# Patient Record
Sex: Male | Born: 1960 | Race: White | Hispanic: No | Marital: Single | Smoking: Current every day smoker
Health system: Southern US, Community
[De-identification: ages and names within clinical notes are randomized; demographics above are authoritative.]

## PROBLEM LIST (undated history)

## (undated) DIAGNOSIS — I8290 Acute embolism and thrombosis of unspecified vein: Secondary | ICD-10-CM

## (undated) DIAGNOSIS — E2740 Unspecified adrenocortical insufficiency: Secondary | ICD-10-CM

## (undated) DIAGNOSIS — F32A Depression, unspecified: Secondary | ICD-10-CM

## (undated) DIAGNOSIS — Z720 Tobacco use: Secondary | ICD-10-CM

## (undated) DIAGNOSIS — I10 Essential (primary) hypertension: Secondary | ICD-10-CM

## (undated) DIAGNOSIS — N186 End stage renal disease: Secondary | ICD-10-CM

## (undated) DIAGNOSIS — E785 Hyperlipidemia, unspecified: Secondary | ICD-10-CM

## (undated) DIAGNOSIS — F329 Major depressive disorder, single episode, unspecified: Secondary | ICD-10-CM

## (undated) DIAGNOSIS — I351 Nonrheumatic aortic (valve) insufficiency: Secondary | ICD-10-CM

## (undated) DIAGNOSIS — N200 Calculus of kidney: Secondary | ICD-10-CM

## (undated) DIAGNOSIS — R079 Chest pain, unspecified: Secondary | ICD-10-CM

## (undated) DIAGNOSIS — G459 Transient cerebral ischemic attack, unspecified: Secondary | ICD-10-CM

## (undated) DIAGNOSIS — F419 Anxiety disorder, unspecified: Secondary | ICD-10-CM

## (undated) DIAGNOSIS — G894 Chronic pain syndrome: Secondary | ICD-10-CM

## (undated) HISTORY — DX: Acute embolism and thrombosis of unspecified vein: I82.90

## (undated) HISTORY — DX: Calculus of kidney: N20.0

## (undated) HISTORY — DX: Chest pain, unspecified: R07.9

## (undated) HISTORY — DX: Transient cerebral ischemic attack, unspecified: G45.9

## (undated) HISTORY — DX: Tobacco use: Z72.0

## (undated) HISTORY — DX: Chronic pain syndrome: G89.4

## (undated) HISTORY — DX: Unspecified adrenocortical insufficiency: E27.40

## (undated) HISTORY — PX: NEPHRECTOMY: SHX65

## (undated) HISTORY — DX: Anxiety disorder, unspecified: F41.9

## (undated) HISTORY — DX: Hyperlipidemia, unspecified: E78.5

## (undated) HISTORY — DX: Essential (primary) hypertension: I10

---

## 2006-10-01 ENCOUNTER — Ambulatory Visit: Payer: Self-pay | Admitting: Cardiology

## 2006-10-22 ENCOUNTER — Ambulatory Visit: Payer: Self-pay | Admitting: Cardiology

## 2006-10-22 ENCOUNTER — Inpatient Hospital Stay (HOSPITAL_COMMUNITY): Admission: EM | Admit: 2006-10-22 | Discharge: 2006-10-31 | Payer: Self-pay | Admitting: Cardiology

## 2006-11-03 ENCOUNTER — Ambulatory Visit (HOSPITAL_COMMUNITY): Admission: RE | Admit: 2006-11-03 | Discharge: 2006-11-03 | Payer: Self-pay | Admitting: Urology

## 2007-05-12 ENCOUNTER — Ambulatory Visit: Payer: Self-pay | Admitting: Cardiology

## 2007-11-05 ENCOUNTER — Ambulatory Visit: Payer: Self-pay | Admitting: Cardiology

## 2007-11-05 ENCOUNTER — Encounter: Payer: Self-pay | Admitting: Cardiology

## 2008-04-26 ENCOUNTER — Encounter: Payer: Self-pay | Admitting: Cardiology

## 2008-09-21 ENCOUNTER — Ambulatory Visit: Payer: Self-pay | Admitting: Cardiology

## 2008-09-22 ENCOUNTER — Encounter: Payer: Self-pay | Admitting: Cardiology

## 2008-12-07 ENCOUNTER — Ambulatory Visit: Payer: Self-pay | Admitting: Cardiology

## 2008-12-14 ENCOUNTER — Ambulatory Visit: Payer: Self-pay | Admitting: Cardiology

## 2009-01-04 ENCOUNTER — Ambulatory Visit: Payer: Self-pay | Admitting: Cardiology

## 2009-01-11 ENCOUNTER — Ambulatory Visit: Payer: Self-pay | Admitting: Cardiology

## 2009-02-22 ENCOUNTER — Encounter: Payer: Self-pay | Admitting: Cardiology

## 2009-03-28 ENCOUNTER — Encounter: Payer: Self-pay | Admitting: Cardiology

## 2009-07-24 ENCOUNTER — Ambulatory Visit: Payer: Self-pay | Admitting: Cardiology

## 2009-07-24 DIAGNOSIS — R0789 Other chest pain: Secondary | ICD-10-CM

## 2009-07-24 DIAGNOSIS — Z9189 Other specified personal risk factors, not elsewhere classified: Secondary | ICD-10-CM

## 2009-07-24 DIAGNOSIS — N186 End stage renal disease: Secondary | ICD-10-CM

## 2009-07-24 DIAGNOSIS — I359 Nonrheumatic aortic valve disorder, unspecified: Secondary | ICD-10-CM | POA: Insufficient documentation

## 2009-11-26 ENCOUNTER — Encounter: Payer: Self-pay | Admitting: Cardiology

## 2009-11-28 ENCOUNTER — Encounter: Payer: Self-pay | Admitting: Cardiology

## 2009-11-30 ENCOUNTER — Encounter: Payer: Self-pay | Admitting: Cardiology

## 2009-12-10 ENCOUNTER — Encounter: Payer: Self-pay | Admitting: Cardiology

## 2010-02-12 ENCOUNTER — Ambulatory Visit: Payer: Self-pay | Admitting: Cardiology

## 2010-02-12 DIAGNOSIS — J811 Chronic pulmonary edema: Secondary | ICD-10-CM

## 2010-02-12 DIAGNOSIS — I10 Essential (primary) hypertension: Secondary | ICD-10-CM | POA: Insufficient documentation

## 2010-02-12 DIAGNOSIS — R1310 Dysphagia, unspecified: Secondary | ICD-10-CM | POA: Insufficient documentation

## 2010-02-13 ENCOUNTER — Telehealth (INDEPENDENT_AMBULATORY_CARE_PROVIDER_SITE_OTHER): Payer: Self-pay | Admitting: *Deleted

## 2010-02-14 ENCOUNTER — Encounter: Payer: Self-pay | Admitting: Cardiology

## 2010-02-19 ENCOUNTER — Encounter: Payer: Self-pay | Admitting: Cardiology

## 2010-02-19 ENCOUNTER — Ambulatory Visit: Payer: Self-pay | Admitting: Cardiology

## 2010-03-21 ENCOUNTER — Encounter: Payer: Self-pay | Admitting: Cardiology

## 2010-11-19 NOTE — Progress Notes (Signed)
Summary: needs echo  Phone Note Other Incoming   Summary of Call: Zachary [name], patient also needs ECHO for AI. Forgot to tellyou.   Left message to return call.  Lovina Reach, LPN  April 27, 624THL 624THL AM   ok will order also. vs. Initial call taken by: Delfino Lovett,  February 13, 2010 4:55 PM

## 2010-11-19 NOTE — Assessment & Plan Note (Signed)
Summary: 6 MO FU PER APRIL REMINDER   Visit Type:  Follow-up Primary Provider:  Hasanji  CC:  follow-up visit.  History of Present Illness: the patient is a 50 year old malehistory of end-stage renal disease and moderate aortic insufficiency. The patient has no history of known coronary artery disease, he had a negative stress test a year ago. He also is evaluated with a transesophageal echocardiogram and was felt not to have severe aortic insufficiency. He has preserved ejection fraction. Several months ago the patient was admitted because of acute onset of shortness of breath. Per the wife's history he was found to be pulmonary edema and the plan was to intubate. Ultimately the patient turned around with facial mask BiPAP. He reports to me that his blood pressure was 260/150. The patient stated he felt like he was going to die a day. Unfortunately no consult for cardiology was called for. Ultimately the patient improved but did have several more episodes after hospitalization of dyspnea. The patient's blood pressure remains poorly controlled. He states on dialysis he frequently has very high blood pressures. The patient is currently only taking Adalat and metoprolol. He has a history of palpitations which continue but they are rare. He denies any central chest pain. He's otherwise compliant with his medical regimen. He states short of breath on moderate exertion he denies however any substernal chest pain.  The patient also reports odynophagia and dysphagia, which has been going on for several months. He symptoms however appears to be worsening. He denies any nausea or vomiting.  Preventive Screening-Counseling & Management  Alcohol-Tobacco     Smoking Status: current     Smoking Cessation Counseling: yes     Packs/Day: 1/2PPD  Current Problems (verified): 1)  Essential Hypertension, Malignant  (ICD-401.0) 2)  Pulmonary Edema  (ICD-514) 3)  Dysphagia Unspecified  (ICD-787.20) 4)  Chest  Pain, Atypical  (ICD-786.59) 5)  Oth Spec Pers Hx Presenting Hazards Health Oth  (ICD-V15.89) 6)  Aortic Insufficiency, Moderate  (ICD-424.1) 7)  Esrd  (ICD-585.6)  Current Medications (verified): 1)  Endocet 10-325 Mg Tabs (Oxycodone-Acetaminophen) .... One Tablet 4 Times Daily 2)  Aspirin 81mg s .... One Tablet Daily 3)  Sensipar 30 Mg Tabs (Cinacalcet Hcl) .... One Tablet Daily 4)  Colace 100 Mg Caps (Docusate Sodium) .... One Tablet Two Times Per Day 5)  Zoloft 50 Mg Tabs (Sertraline Hcl) .... One Tablet Daily 6)  Dialyvite 3000 3 Mg Tabs (B Complex-C-Biotin-E-Min-Fa) .... One After Dialysis Monday Wednesday and Friday 7)  Ativan 1 Mg Tabs (Lorazepam) .... One Tablet 3 Times Per Day 8)  Hydrocortisone 20 Mg Tabs (Hydrocortisone) .... One Tablet Daily 9)  Renvela 800 Mg Tabs (Sevelamer Carbonate) .... Take Six By Mouth With Meals and Three By Mouth With Snacks 10)  Metoprolol Succinate 50 Mg Xr24h-Tab (Metoprolol Succinate) .... Take One Tablet By Mouth Daily 11)  Adalat Cc 90 Mg Xr24h-Tab (Nifedipine) .... Take 1 Tablet By Mouth Once A Day 12)  Hydralazine Hcl 25 Mg Tabs (Hydralazine Hcl) .... Take 1 Tablet By Mouth Four Times A Day 13)  Nitrostat 0.4 Mg Subl (Nitroglycerin) .Marland Kitchen.. 1 Tablet Under Tongue At Onset of Chest Pain; You May Repeat Every 5 Minutes For Up To 3 Doses. 14)  Hydralazine Hcl 50 Mg Tabs (Hydralazine Hcl) .... Take One Tablet If Bp Greater Than 160  Allergies (verified): 1)  ! Codeine  Comments:  Nurse/Medical Assistant: The patient's medications and allergies were reviewed with the patient and were updated in the Medication  and Allergy Lists. Verbally gave names of meds.   Past History:  Past Medical History: Last updated: 12/11/2008 chronic renal failure hypertension anxiety edema chest pain T. I. a hyperlipidemia tobacco abuse adrenal insufficiency venous thrombosis chronic pain syndrome staghorn calculus recent removal of left kidney secondary to  staghorn calculus and chronic pyelonephritis  Family History: Last updated: 12/11/2008 smokes one pack a day times many years family history chronic pain hypertension hyperlipidemia coronary artery disease  Social History: Last updated: 07/24/2009 Tobacco Use - Yes.   Risk Factors: Smoking Status: current (02/12/2010) Packs/Day: 1/2PPD (02/12/2010)  Social History: Packs/Day:  1/2PPD  Review of Systems       The patient complains of shortness of breath.  The patient denies fatigue, malaise, fever, weight gain/loss, vision loss, decreased hearing, hoarseness, chest pain, palpitations, prolonged cough, wheezing, sleep apnea, coughing up blood, abdominal pain, blood in stool, nausea, vomiting, diarrhea, heartburn, incontinence, blood in urine, muscle weakness, joint pain, leg swelling, rash, skin lesions, headache, fainting, dizziness, depression, anxiety, enlarged lymph nodes, easy bruising or bleeding, and environmental allergies.    Vital Signs:  Patient profile:   50 year old male Height:      71 inches Weight:      168 pounds Pulse rate:   59 / minute BP sitting:   140 / 73  (left arm) Cuff size:   regular  Vitals Entered By: Georgina Peer (February 12, 2010 10:03 AM) CC: follow-up visit   Physical Exam  Additional Exam:  General: Well-developed, well-nourished in no distress head: Normocephalic and atraumatic eyes PERRLA/EOMI intact, conjunctiva and lids normal nose: No deformity or lesions mouth normal dentition, normal posterior pharynx neck: Supple, no JVD.  No masses, thyromegaly or abnormal cervical nodes. Prominent bilateral carotid pulses lungs: Normal breath sounds bilaterally without wheezing.  Normal percussion heart: regular rate and rhythm with normal S1 and S2, no S3 or S4.  PMI is normal.  [id number] diastolic murmur extending throughout diastole. abdomen: Normal bowel sounds, abdomen is soft and nontender without masses, organomegaly or hernias noted.  No  hepatosplenomegaly musculoskeletal: Back normal, normal gait muscle strength and tone normal pulsus: Pulse is normal in all 4 extremities Extremities: No peripheral pitting edema neurologic: Alert and oriented x 3 skin: Intact without lesions or rashes cervical nodes: No significant adenopathy psychologic: Normal affect    Impression & Recommendations:  Problem # 1:  DYSPHAGIA UNSPECIFIED (ICD-787.20) the patient reports dysphagia and odynophagia. I've referred him for an evaluation with a barium swallow. Orders: Radiology Other (Rad Other)  Problem # 2:  AORTIC INSUFFICIENCY, MODERATE (ICD-424.1) the patient has a history of moderate aortic insufficiency. We will reevaluate him with an echocardiogram to make sure he does not develop worsening aortic insufficiency His updated medication list for this problem includes:    Metoprolol Succinate 50 Mg Xr24h-tab (Metoprolol succinate) .Marland Kitchen... Take one tablet by mouth daily    Nitrostat 0.4 Mg Subl (Nitroglycerin) .Marland Kitchen... 1 tablet under tongue at onset of chest pain; you may repeat every 5 minutes for up to 3 doses.  Problem # 3:  ESRD (ICD-585.6) followed by Dr. Sharrell Ku  Problem # 4:  PULMONARY EDEMA (ICD-514) no evidence of volume overload bipolar edema appears to be mediated by a combination of aortic insufficiency and hypertension.  Problem # 5:  ESSENTIAL HYPERTENSION, MALIGNANT (ICD-401.0) the patient has episodes of severe hypertension. I told him to treat his immediately with sublingual nitroglycerin 0.4 mg x3 and a dose of hydralazine 50 mg for systolic blood  pressure greater than 160 mmHg.the patient needs to avoid the onset of pulmonary edema. His updated medication list for this problem includes:    Metoprolol Succinate 50 Mg Xr24h-tab (Metoprolol succinate) .Marland Kitchen... Take one tablet by mouth daily    Adalat Cc 90 Mg Xr24h-tab (Nifedipine) .Marland Kitchen... Take 1 tablet by mouth once a day    Hydralazine Hcl 25 Mg Tabs (Hydralazine hcl) .Marland Kitchen...  Take 1 tablet by mouth four times a day    Hydralazine Hcl 50 Mg Tabs (Hydralazine hcl) .Marland Kitchen... Take one tablet if bp greater than 160  Patient Instructions: 1)  Hydralazine 25mg  four x day 2)  May take 50mg  Hydralazine if elevated BP greater than 160 3)  Nitroglycerin as needed for severe chest pain 4)  Barrium Swallow  5)  Follow up in  one month Prescriptions: HYDRALAZINE HCL 50 MG TABS (HYDRALAZINE HCL) take one tablet if BP greater than 160  #30 x 1   Entered by:   Lovina Reach, LPN   Authorized by:   Terald Sleeper, MD, [facility]   Signed by:   Lovina Reach, LPN on 624THL   Method used:   Electronically to        [facility] (retail)       [address]       [place], [place]  [address]       Ph: [phone] or [phone]       Fax: [phone]   RxID:   [id number] NITROSTAT 0.4 MG SUBL (NITROGLYCERIN) 1 tablet under tongue at onset of chest pain; you may repeat every 5 minutes for up to 3 doses.  #25 x 3   Entered by:   Lovina Reach, LPN   Authorized by:   Terald Sleeper, MD, [facility]   Signed by:   Lovina Reach, LPN on 624THL   Method used:   Electronically to        [facility] (retail)       [address]       [place], [place]  [address]       Ph: [phone] or [phone]       Fax: [phone]   RxID:   [id number]   Handout requested. HYDRALAZINE HCL 25 MG TABS (HYDRALAZINE HCL) Take 1 tablet by mouth four times a day  #120 x 6   Entered by:   Lovina Reach, LPN   Authorized by:   Terald Sleeper, MD, [facility]   Signed by:   Lovina Reach, LPN on 624THL   Method used:   Electronically to        [facility] (retail)       [address]       [place], [place]  [address]       Ph: [phone] or [phone]       Fax: [phone]   RxID:   [id number]   Handout requested.

## 2010-11-19 NOTE — Miscellaneous (Signed)
Summary: Orders Update  Clinical Lists Changes  Orders: Added new Referral order of 2-D Echocardiogram (2D Echo) - Signed 

## 2010-11-19 NOTE — Letter (Signed)
Summary: Appointment -missed  [facility]. [address], [place] [address]   Phone: [phone]  Fax: [phone]     March 21, 2010 MRN: [id number]     Zachary [name] Rosser, [place]  [id number]     Dear [name],  [facility] records indicate you missed your appointment on March 21, 2010                        with Dr.  Dannielle Burn.   It is very important that we reach you to reschedule this appointment. We look forward to participating in your health care needs.   Please contact us at the number listed above at your earliest convenience to reschedule this appointment.   Sincerely,    Public relations account executive

## 2010-11-19 NOTE — Consult Note (Signed)
Summary: [facility]/ DR/ [facility] CONSULT  [facility]/ DR/ [facility] CONSULT   Imported By: Delfino Lovett 02/12/2010 09:23:29  _____________________________________________________________________  External Attachment:    Type:   Image     Comment:   External Document

## 2010-11-22 NOTE — Letter (Signed)
Summary: [facility] D/C DR. Stoney Bang  [facility] D/C DR. XAJE HASANAJ   Imported By: Delfino Lovett 02/12/2010 09:22:26  _____________________________________________________________________  External Attachment:    Type:   Image     Comment:   External Document

## 2011-03-04 NOTE — Assessment & Plan Note (Signed)
[facility]                          [facility] OFFICE NOTE   Zachary [name], Zachary [name]                        MRN:          [id number]  DATE:12/07/2008                            DOB:          03/19/1961    HISTORY OF PRESENT ILLNESS:  The patient is a 50 year old male with  multiple medical problems with chronic renal failure, on hemodialysis.  The patient was recently hospitalized with chest pain and shortness of  breath.  A 2-D echocardiogram showed that he probably had old-healed  vegetations from the aortic valve, but this was associated with moderate  aortic insufficiency.  He also underwent stress testing, which was  negative for ischemia.   The patient returns now for followup visit.  He denies any chest pain or  shortness of breath.  He states that he does have difficulty with access  of his left arm fistula.  Blood pressure is also somewhat uncontrolled  today.  He is also here to discuss the implications of his aortic  insufficiency.   MEDICATIONS:  1. Renvela 800 mg daily.  2. Endocet 10/325 q.i.d.  3. Aspirin 81 mg a day.  4. Sensipar 30 mg a day.  5. Colace 100 mg p.o. b.i.d.  6. Zoloft 50 mg p.o. daily.  7. Dialyvite 3000 post-dialysis.  8. Lopressor 25 b.i.d.  9. Ativan 1 mg p.o. t.i.d.  10.Hydrocortisone 20 mg p.o. daily.   PHYSICAL EXAMINATION:  VITAL SIGNS:  Blood pressure is 151/86, heart  rate is 79, and weight is 177 pounds.  NECK:  Normal carotid upstroke.  No carotid bruits.  LUNGS:  Clear breath sounds bilaterally.  HEART:  Regular rate and rhythm with a 1/6 diastolic murmur along the  left sternal border.  ABDOMEN:  Soft.  EXTREMITIES:  No cyanosis, clubbing, or edema.  Left fistula in the left  forearm.   PROBLEM LIST:  1. End-stage renal disease, on hemodialysis.  2. History of atypical chest pain with negative Cardiolite (possibly      musculoskeletal).  3. History of hypertension, poorly controlled.  4.  Depression.  5. Diabetes mellitus.   PLAN:  1. I have told the patient that we will need a follow up on his aortic      insufficiency with a 32-monthly physical exam and a 1-yearly      echocardiogram.  2. 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  He      has no heart failure symptoms, however.  3. I did place the patient on Adalat CC, which is a tested medication      to halt progression of aortic insufficiency and also to manage his      blood pressure, which is too high.  4. The patient was advised to use endocarditis prophylaxis prior to      his tooth extractions.     Ernestine Mcmurray, MD,FACC  Electronically Signed    GED/MedQ  DD: 12/07/2008  DT: 12/08/2008  Job #: [id number]   cc:  Wynelle Cleveland

## 2011-03-07 NOTE — Consult Note (Signed)
NAMESNEYDER, SPUHLER                 ACCOUNT NO.:  [id number]   MEDICAL RECORD NO.:  [id number]          PATIENT TYPE:  INP   LOCATION:  4728                         FACILITY:  [facility]   PHYSICIAN:  Maudie Flakes. Hassell Done, M.D.   DATE OF BIRTH:  17-May-1961   DATE OF CONSULTATION:  10/23/2006  DATE OF DISCHARGE:                                 CONSULTATION   HISTORY OF PRESENT ILLNESS:  Zachary [name] is a 50 year old white man  admitted yesterday with chest, left arm, left flank pain, shortness of  breath, and numbness on the left side.  On admission he had a BUN of  37, creatinine of 3.  He says he has been hospitalized twice over the  last few months at [facility].  (Dr. Nichola Sizer, phone number  [phone], is reportedly his primary care physician and saw him in the  hospital).  Dr. Oneita Jolly office is currently closed.  He has been told  that he has kidney trouble.  Renal consult has been requested by Dr.  Dannielle Burn.   PAST MEDICAL HISTORY:  Hypertension (noted 2-3 months ago; ? if he has  had previously).  He has also been told he has glucose intolerance, also  prior history of migraine headaches.   REVIEW OF SYSTEMS:  No palpitations, no flushing, no renal colic, no  gross hematuria, no history of renal calculus disease.  No angina, no  claudication, no melena, no hematochezia.  He did have ? syncopal  episode last month (hospitalized in [place]); he has been told by his  primary care doctor of weak kidneys.   MEDICATIONS PRIOR TO ADMISSION:  1. Ativan 1 mg t.i.d.  2. Furosemide 20 daily.  3. Metoprolol 50 b.i.d.  4. Benicar 20 daily.  5. Aspirin 81 daily.   CURRENT MEDICATIONS:  1. Metoprolol 50 b.i.d.  2. Niacin 250 nightly.  3. Aspirin 325 daily.  4. Hydralazine 10 q.i.d.  5. Nitroglycerin paste 1 inch q.6 h.  6. Morphine/Vicodin p.r.n.   ALLERGIES:  CODEINE.   SOCIAL HISTORY:  He graduated high school.  He grew up in [place].  He  cleans air ducts.  He has a 40-pack-year history  of cigarettes.  He has  tried to cut down.  Occasional mixed drink Press photographer).  He has been  married previously.  He lives with his girlfriend of 21 years.   FAMILY HISTORY:  Mother has had MI/COPD.  He has 9 siblings.  One sister  with diabetes and kidney trouble.  He has two children by a previous  marriage, one child by his current girlfriend.   PHYSICAL EXAMINATION:  GENERAL:  He is awake, alert and oriented x3,  lying in bed.  VITAL SIGNS:  Temperature 97.8, pulse 80, respirations 18, blood  pressure 120/70.  CHEST:  Clear.  HEART:  No rub.  ABDOMEN:  Nontender.  No CVA tenderness.  GU:  Circumcised penis.  Testes descended bilaterally.  EXTREMITIES:  No rash.  No edema.  No arthritis.  NEUROLOGIC:  Right handed.  Strength equal.  No weakness.  Slight  tremor.  LABORATORY DATA:  Hemoglobin 13.3, WBC 8500, PLTS 168,000, sodium 138,  potassium 4, chloride 103, CO2 27, BUN 37, CR 3, glucose 144, calcium  9.3.  Urinalysis:  SG 1.018, pH 5.5; 100 mg/dL glucose, 300 mg/dL  protein, 0-2 white cells.  No RBCs.  Renal ultrasound:  Right kidney not  seen.  Left kidney 14.8 cm ? obstruction of upper pole collecting system  on left with ? Calculus.   IMPRESSION:  1. Chronic kidney disease (I think - no prior records).  2. Proteinuria.  3. 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  4. Hypertension (well controlled).  5. Glucose intolerance.  6. Chest pain.   PLAN:  1. Need old records from Dr. Rhae Lerner (phone [phone] I left message on      his phone for him to call).  2. A 24-hour urine for protein/creatinine, SPEP, UPEP, PTH  3. I have called [facility].  4. No suggestions.  5. Check hemoglobin A1c.  6. Per cardiology.           ______________________________  Maudie Flakes. Hassell Done, M.D.     RFF/MEDQ  D:  10/23/2006  T:  10/24/2006  Job:  [id number]   cc:   Ernestine Mcmurray, MD,FACC  Wynelle Cleveland

## 2011-03-07 NOTE — H&P (Signed)
Zachary [name], Zachary [name]                 ACCOUNT NO.:  [id number]   MEDICAL RECORD NO.:  [id number]          PATIENT TYPE:  INP   LOCATION:  4729                         FACILITY:  [facility]   PHYSICIAN:  Ernestine Mcmurray, MD,FACC DATE OF BIRTH:  30-Jun-1961   DATE OF ADMISSION:  10/22/2006  DATE OF DISCHARGE:                              HISTORY & PHYSICAL   REASON FOR ADMISSION:  Chest and left-sided flank pain.   HISTORY OF PRESENT ILLNESS:  The patient is a 50 year old male with no  prior cardiac history.  The patient has required several admissions at  [facility] over the last month both with chest pain and left  flank pain.  The patient also reports on several occasions nausea and  vomiting as well as dizziness.  His initial admission at [facility] was  mainly focused around an evaluation for possible underlying heart  disease.  The patient was ruled out for myocardial infarction with  negative enzymes.  This was followed by an echo and a stress  echocardiographic study which showed no definite ischemia.  Ejection  fraction was 50-60% but a bicuspid aortic valve could not be ruled out.  Note was that the patient had an abnormal creatinine at 2.9.  The  patient did not have a prior history of renal insufficiency and has not  received a prior work for this to my knowledge.  He also tells me that  he has not seen a nephrologist.  Subsequently, the patient had a second  admission at [facility] when he presented predominantly with  nausea and again left-sided flank pain.  He also reported significant  dizziness and headaches.  He had a workup done with an MRI and a CT as  well as carotid Dopplers.  All studies were reportedly negative.   The patient as well as wife relate now to me that he has had ongoing  intermittent problems since his hospital discharge.  He has sharp  stabbing pains in the mid sternum which are both occurring at rest and  on exertion.  These episodes in general  are brief lived.  Today he had  more prolonged pain with radiation of his pain into the left flank and  left side of his back.  During my physical examination, actually, the  patient is quite tender in the left costovertebral area.  His  presentation is associated with nausea and vomiting which occurred on  several occasions today.  Following this, he stated that he had severe  headaches.  He presented to the ER at [facility] and was given pain  control.  He states now that his chest pain is essentially gone, but  still has left-sided flank pain.  He denies any dysuria or frequency.  He also has no orthopnea or PND and reports no palpitations or syncope  and he also reports no fever or chills.  The patient is now more  comfortable.  He was placed on Nitro paste and IV nitroglycerin in the  [facility] emergency room.  He was transferred for further care to [place]:  TYLENOL with CODEINE.  There are some reports of MORPHINE  allergy, but I discussed this carefully with the patient and he denies  any allergy to morphine.   MEDICATIONS:  1. Metoprolol 50 mg p.o. b.i.d.  2. Niacin 250 mg p.o. q.h.s.  3. Aspirin 325 mg daily.  4. Lasix  20 mg p.o. daily.   PAST MEDICAL HISTORY:  Hypertension, dyslipidemia, with  hypertriglyceridemia triglycerides are reportedly severely elevated at  1100.  Renal insufficiency with creatinine at 2.9, diabetes mellitus,  history of anxiety, questionable TIA on October 01, 2006, with report  of studies as outlined above, stress echocardiographic study within  normal limits.   SOCIAL HISTORY:  The patient lives in [place], [place].  He is an  air duct cleaner.  He smokes half a pack a day.  He rarely exercises.   FAMILY HISTORY:  He has a family history that is significant for heart  disease.  His mother had a myocardial infarction and emphysema.  He also  has several sisters who have heart disease reportedly.   REVIEW OF  SYSTEMS:  No fever or chills, but positive for sweats.  Positive for headaches.  No photophobia or visual changes.  No rash or  lesions.  Positive for chest pain and dyspnea on exertion.  No orthopnea  or PND.  No presyncope or claudication.  No definite frequency or  dysuria. Positive for weakness, but no numbness.  No myalgia or  arthralgias.  Positive for nausea and vomiting.  No diarrhea.  No melena  or hematochezia.  No polyuria or polydipsia.   PHYSICAL EXAMINATION:  VITAL SIGNS:  Blood pressure is 135/70, heart  rate is 84 beats per minute, temperature is 98, saturation is 99% on  room air.  GENERAL:  White male in no apparent distress.  HEENT:  PERRLA.  EOMI, sclerae clear.  Oropharynx is clear.  Poor  dentition.  NECK:  Supple with no carotid bruits, normal carotid upstroke.  JVD is  approximately 6 cm.  HEART:  Regular rate and rhythm.  Normal S1 and S2.  There is a soft  systolic murmur at the left upper sternal border, but no other  pathological murmurs.  There is no S3 or S4.  LUNGS:  Clear breath sounds bilaterally with no wheezing.  SKIN:  No rash or lesions.  ABDOMEN:  Soft and nontender with no rebound or guarding.  Good bowel  sounds.  Left flank tenderness and positive for CVA tenderness.  GENITOURINARY:  RECTAL:  Deferred.  EXTREMITIES:  No cyanosis, clubbing, or edema.  NEUROLOGY:  The patient is alert, oriented, and grossly nonfocal.   Chest x-ray is pending.  Reportedly a chest x-ray was done at [facility]  which was within normal limits.  Chest x-ray on September 27, 2006, showed  no acute abnormalities.   EKG demonstrates normal sinus rhythm with nonspecific T wave changes.  D-  dimer was 219 at [place]:  White count is 9.8, hemoglobin 13.9, hematocrit 39,  platelet count 187.  Glucose 117, BUN 33, creatinine 2.8, GFR is 26 mL  per minute, sodium 135, potassium 4.2, chloride 100, CO2 25.  Troponin 0.01.  Total CPK is within normal  limits.   CT scan of the head shows no acute abnormality which was done at  [facility].   PROBLEM LIST:  1. Atypical chest pain.  2. Left flank pain and left costovertebral angle tenderness.      a.  Rule out hydronephrosis.      b.     Rule out nephrolithiasis.  3.Hypertriglyceridemia.    A.  Rule out pancreatitis.  1. Renal insufficiency, suspected chronic, but no prior workup.  2. History of hypertension, poorly controlled blood pressure.      a.     Rule out aortic dissection (unlikely).  3. Hypertriglyceridemia.   PLAN:  1. The patient presents with a very confusing picture.  I was      initially told that the patient presented with chest pain to the      emergency room, but his pain predominately centers around the left      flank and left costovertebral area.  2. Cardiac enzymes will be obtained, but in light of the patient's      negative stress echocardiographic study, I do not suspect a further      ischemia workup is acutely indicated, particularly in light of the      patient's renal insufficiency of 2.9.  I do not think cardiac      catheterization is presently indicated.  3. Further evaluation of left CVA tenderness is indicated and I am not      even sure as to the duration of the patient's renal insufficiency.      We will obtain an ultrasound of the kidneys in the morning to rule      out hydronephrosis.  4. The patient also has longstanding history of hypertension and on      last presentation at [facility] he was quite hypertensive.  He does      report some pain radiating to the back and we will further rule out      the possibility of aortic dissection, although, I think this is      distinctly unlikely.  5. Blood pressure control will be obtained with the addition of      hydralazine, but we will put Lasix on hold for now.  6. Further diagnostic testing has been ordered as per orders and I      suspect a renal consultation as well as a medicine consult may  be      in order during this admission.      Ernestine Mcmurray, MD,FACC  Electronically Signed     GED/MEDQ  D:  10/22/2006  T:  10/23/2006  Job:  [id number]

## 2011-03-07 NOTE — Discharge Summary (Signed)
Zachary [name], [name]                 ACCOUNT NO.:  [id number]   MEDICAL RECORD NO.:  [id number]          PATIENT TYPE:  INP   LOCATION:  5018                         FACILITY:  [facility]   PHYSICIAN:  Wallis Bamberg. Johnsie Cancel, MD, FACCDATE OF BIRTH:  06/07/1961   DATE OF ADMISSION:  10/22/2006  DATE OF DISCHARGE:  10/31/2006                               DISCHARGE SUMMARY   DISCHARGING PHYSICIAN:  Collier Salina C. Johnsie Cancel, MD, [facility].   PRIMARY CARDIOLOGIST:  Ernestine Mcmurray, MD, [facility].   UROLOGIST:  Domingo Pulse, M.D.   NEPHROLOGY:  Elzie Rings. Lorrene Reid, M.D.   PRIMARY CARE:  Dr. Nichola Sizer, phone number is [phone].   DISCHARGING DIAGNOSES:  1. Atypical chest pain  2. Left flank pain and left costovertebral angle tenderness.      a.     Status post cystoscopy with attempted right retrograde, left       retrograde, left Double-J catheter insertion with inability to       pass guidewire into the upper pole segment.  3. Renal insufficiency.  4. Renal calculi.  5. Solitary kidney.  6. Hypertension.  7. Hypercholesteremia.  8. Diabetes mellitus.  9. Ongoing tobacco use.   PROCEDURES THIS ADMISSION:  1. October 23, 2006, MRI of the chest without contrast:  Negative for      aortic dissection, questionable left hydronephrosis.  2. Renal ultrasound, October 23, 2006, showing obstruction of the upper      pole system of the left kidney, probably due to calculus.  3. A CT of the abdomen, October 24, 2006, negative for distal urethral      calculus, normal appendix, functional solitary left kidney with      nephrolithiasis, upper pole obstruction, and hydronephrosis, and      fatty of infiltration of the liver.  4. October 26, 2006, left percutaneous nephrostomy tube by Dr. Markus Daft.  The patient underwent placement of an 8-French percutaneous      nephrostomy tube within the dilated upper pole calix.  The renal      pelvis could be cannulated.  Recently placed urethral stent now in      the distal  ureter.  Multiple stones associated with left kidney.  5. Abdominal view, October 28, 2006, of left nephrostomy tube shows      migration of left urethral stent, is seen with the tip of the      catheter projecting over the expected location of the left renal      pelvis.  There is a large calculus contained within.  The remainder      of the stent is formed in the bladder.   HOSPITAL COURSE:  Zachary [name] is a 50 year old Caucasian gentleman with no  prior cardiac history who presented to [facility] complaining of  chest discomfort and left flank pain, associated with nausea and  vomiting as well as dizziness.  His initial admission at [facility] focus  mainly on possible underlying heart disease.  The patient ruled out for  MI with negative enzymes.  This was followed by  echocardiogram and a  stress echo which showed no definite ischemia.  ER was 50-60% but a  bicuspid aortic valve could not be ruled out.  The patient was also  found to have a creatinine of 2.9.  The patient had an MRI and a CT done  as well as carotid Dopplers, all studies were reportedly negative.  Cardiology was asked to see the patient upon admission to [facility] secondary to chest discomfort.  The patient was placed on Nitro  paste and IV nitroglycerin and transferred to [facility] for  further evaluation.   A chest x-ray showed no acute abnormalities.  EKG demonstrated normal  sinus rhythm with nonspecific T-wave changes.  D-dimer was within normal  limits.  Initial blood work showed a WBC of 9.8, hemoglobin 13.9,  hematocrit of 39, platelet count of 187,000, BUN 33, creatinine 2.8.  CT  scan of the head showed no acute abnormalities at [facility].   The patient's symptoms were very concerning.  Chest discomfort, somewhat  atypical.  The patient found to be hypertensive, however, and also in  renal insufficiency.  Zachary [name] has a history of longstanding  hypertension in the setting of  ongoing tobacco use.  Further diagnostic  testing was done under the guidance of renal who was asked to consult.   Dr. Hassell Done saw the patient in consultation on October 23, 2006.  He felt the  patient may have had chronic kidney disease after further discussion  with the patient but no prior records were available.  He also noted  proteinuria and a solitary kidney with questionable obstruction of  collecting system in the left upper pole.  Hypertension, currently well  controlled.  Glucose intolerance and chest pain with negative cardiac  workup.  He ordered a 24-hour urine and tried to obtain records from the  patient's primary care physician.  Dr. Amalia Hailey saw the patient in  consultation secondary to renal calculi and obstruction of the upper  pole of the left kidney.  He ordered a non-contrast CT to delineate  anatomy further.  Cystoscopy retrograde and stent placement with  possible stenting of both poles of the left kidney if obstructed.  The  patient went for a cystoscopy with attempted right retrograde, left  retrograde, left DOUBLE-J catheter insertion on the 6th.  With further  treatment, the patient had a left percutaneous nephrostomy tube placed.  Left percutaneous nephrostomy tube was placed on January 7th.   The patient was started on a low-dose ACE inhibitor.  Beta blocker was  discontinued.  The patient has had little output from upper pole in the  setting of low renal function and solitary kidney.  It is felt that the  patient needs to be evaluated by Dr. Desma Maxim at [facility].  Left  nephrostomy tube left in place with gravity drainage.  The patient still  having some discomfort, was treated with p.o. pain medications as the  patient will need further workup - lithotripsy - per renal.   Dr. Johnsie Cancel in to see the patient on day of discharge.  Vital signs  stable.  He spoke with Dr. Amalia Hailey who has arranged outpatient followup at [facility] with Dr. Desma Maxim.  The patient is  being discharged home with  medications as stated below.   The patient to follow up with urology and renal as instructed by Dr.  Amalia Hailey.   At time of discharge his medications include:  1. Lisinopril 10 mg daily.  2. Niacin 250  mg at bedtime.  3. Pyridium 200 mg every 8 hours p.r.n.  4. Ditropan 5 mg every 8 hours p.r.n.  5. Percocet 5/325 every 6 hours p.r.n.  6. Colace 1-2 tablets daily as needed.  7. Aspirin 81 mg daily.   The patient has been instructed to stop his Toprol and Lasix.   WOUND CARE:  Leg bag as instructed by Dr. Amalia Hailey.   FOLLOWUP:  1. Follow up with Dr. Amalia Hailey [phone] as instructed.  2. Follow up with Dr. Desma Maxim at [facility], number [phone].  I      asked the patient's wife to call Monday and confirm appointment.   No further cardiac workup at this time, until renal status resolved.  The patient can follow up with Dr. Dannielle Burn at our [facility] office p.r.n.   DISCHARGE LABORATORY WORK:  H&H 10.8 and 30.6 with a platelet count  131,000, WBC 6.8.  Sodium 138, potassium 4.4, glucose 175, BUN 33,  creatinine 2.97.  Twenty-four-hour uric acid, total urine collected  2100, uric acid 22.4, uric acid 24 hour 470.4.  Urine to calcium 24 hour  urine 42.  Iron 58, TIBC 280, percent sat 21 UIBC 222.   DURATION OF DISCHARGE ENCOUNTER:  Sixty minutes.      Rosanne Sack, ACNP      Wallis Bamberg. Johnsie Cancel, MD, [facility]  Electronically Signed    MB/MEDQ  D:  10/31/2006  T:  10/31/2006  Job:  [id number]   cc:   Wallis Bamberg. Johnsie Cancel, MD, [facility]  Ernestine Mcmurray, MD,FACC  Domingo Pulse, M.D.  Elzie Rings Lorrene Reid, M.D.  Assimos, Dr.  Nichola Sizer, Dr.

## 2011-03-07 NOTE — Op Note (Signed)
Zachary [name], Zachary [name]                 ACCOUNT NO.:  [id number]   MEDICAL RECORD NO.:  [id number]          PATIENT TYPE:  INP   LOCATION:  [id number]                         FACILITY:  [facility]   PHYSICIAN:  Domingo Pulse, M.D.  DATE OF BIRTH:  Jun 30, 1961   DATE OF PROCEDURE:  10/25/2006  DATE OF DISCHARGE:                               OPERATIVE REPORT   SERVICE:  Urology.   PREOPERATIVE DIAGNOSES:  1. Obstructed upper pole, left kidney, secondary to partial staghorn.  2. Atrophic right kidney.   POSTOPERATIVE DIAGNOSES:  1. Obstructed upper pole, left kidney, secondary to partial staghorn.  2. Atrophic right kidney.   PROCEDURES:  Cystoscopy, attempted right retrograde, left retrograde,  left JJ catheter insertion.   SURGEON:  Domingo Pulse, M.D.   ANESTHESIA:  General.   COMPLICATIONS:  Inability to pass guidewire into upper pole segment.   HISTORY:  This 50 year old male has been admitted to the hospital on two  recent occasion for what was first thought to be flank pain but it  became clear that some of the pain was actually from the left kidney.  Evaluation demonstrated azotemia and an ultrasound showed that the  patient had hydronephrosis of the upper pole segment and no evidence of  a kidney on the right hand side.  The patient had a CT scan done without  contrast which showed at most a nubbin of a right kidney, most likely  due to atrophy dating back to childhood, and on the left hand side, the  patient had what appeared to be a single ureter but an obstructed upper  pole segment due to a partial staghorn.  The collecting system on that  left hand side showed marked dilation above a caliceal stricture with  stone impacted and the upper pole segment clearly was not well drained.  It is unclear how much function there is in that portion of the kidney  but he patient is now to undergo attempted placement of a stent up into  that segment to see if we can decompress the kidney.  If that cannot be  decompressed, will have a percutaneous nephrostomy tube placement done  to determine if there is any function coming from that outer pole and  then plans can be made to determine the best method of handling his  situation.  Patient clearly has very poor renal function options in a  solitary kidney with pole obstruction are all fraught with the  possibility he could end up losing his remaining renal function.  The  patient is now to undergo cystoscopy, retrograde, to attempt a JJ  catheter placement.  He understands the risks and benefits of the  procedure and gave full informed consent.   PROCEDURE:  After a successful induction of general anesthesia, the  patient was placed in the dorsal lithotomy position, prepped with  Betadine and draped in the usual sterile fashion.  Cystoscopy was  performed.  Urethra was visualized in its entirety and found  to be  normal.  Beyond the verumontanum, the patient had no significant  prostatic enlargement.  A  tiny dimple of a right ureter could be  identified but there is clearly no flow coming out of that ureter.  Attempts to cannulate this performed retrograde were unsuccessful and it  certainly felt that this patient has an atrophied kidney with a stenotic  ureter and no function on that side.  A left retrograde was then  performed.  The patient had a reasonably normal-appearing ureter.  He  had a normal collecting system in the low and mid pole with a very  dilated upper pole.  Some contrast could be seen trapped up above what  appeared to be a stone but there was no real obvious connection between  the collecting system and that caliceal system.  This is was tightly  obstructed and a wire could not be passed into that area.  The wire  seemed to deviate into the mid pole or lower pole.  Decision was made to  place a JJ catheter and then eventually to place a nephrostomy tube in  that upper pole segment and this will allow either  for a percutaneous  nephrolithotripsy.  The JJ catheter was then advanced over the guide  wire which was then removed and the JJ coiled normally in the lower pole  section of the kidney.  The bladder was drained.  The patient tolerated  the procedure well and was taken to the recovery room in good condition.   We will arrange for a left percutaneous nephrostomy tube to be placed  and determine if that upper pole segment actually has much function.  If  there is little if any function in that area, then a partial nephrectomy  to remove that section and to extract the stones might be the optimal  way to maximize his renal function.  If there does appear to be some  function on that side with some output coming out that upper pole, then  the patient needs to be considered for a percutaneous nephrolithotripsy  along with dilation of the obstructing caliceal system to allow for free  access of that upper pole down into the pelvis and better drainage of  that kidney.           ______________________________  Domingo Pulse, M.D.  Electronically Signed     RJE/MEDQ  D:  10/25/2006  T:  10/25/2006  Job:  [id number]   cc:   Elzie Rings. Lorrene Reid, M.D.  Ernestine Mcmurray, MD,FACC

## 2011-03-07 NOTE — Consult Note (Signed)
Zachary [name], Zachary [name]                 ACCOUNT NO.:  [id number]   MEDICAL RECORD NO.:  [id number]          PATIENT TYPE:  INP   LOCATION:  4729                         FACILITY:  [facility]   PHYSICIAN:  Domingo Pulse, M.D.  DATE OF BIRTH:  Apr 08, 1961   DATE OF CONSULTATION:  DATE OF DISCHARGE:                                 CONSULTATION   CONSULTING PHYSICIANS:  1. Dr. Terald Sleeper.  2. Dr. Salem Senate.   REASON FOR CONSULTATION:  1. Renal insufficiency.  2. Renal calculi.  3. Possible obstruction upper pole left kidney.  4. Solitary kidney.   HISTORY:  This 50 year old male has had 2 admissions to [facility] the past month with chest pain as well as some left flank pain.  This has been associated with nausea, vomiting, dizziness.  The patient  was thought to have heart disease; however, he had negative enzymes.  He  has had an echo and stress echocardiogram with no evidence of ischemia.  The patient was noted to have an abnormal creatinine of 2.9.  There was  no previous history of renal insufficiency and the patient did not know  that he had any renal problems.  The patient apparently had an MRI and a  CT as well as carotid Dopplers that were negative.  The patient ended up  coming to the hospital here for additional evaluation.  Dr. Dannielle Burn  evaluated him and felt that the patient's pain was really much more in  the left costovertebral area and that it was associated with nausea and  vomiting, and was not so much of a cardiac problem.  During the  patient's initial evaluation an ultrasound was obtained which showed an  absent right kidney, but showed a left kidney that appeared to have a  duplicated system with stones possibly obstructing his upper pole  segment.  Urologic consultation has sought to determine the best way to  handle this situation.   PAST MEDICAL HISTORY:  Remarkable for:  1. Hypertension.  2. Elevated cholesterol.  3. Diabetes mellitus.  4. Renal  insufficiency.   SOCIAL HISTORY:  The patient does have a smoking history, although he  has cut back to 1/2 pack a day.   FAMILY HISTORY:  Pertinent for heart disease and COPD in his mother and  also several other family members who have heart disease.   REVIEW OF SYSTEMS:  Pertinent for nausea, vomiting, headaches, abdominal  pain, left flank pain, as well as possible chest pain.  He has not had  shortness of breath.  There were no urinary symptoms up until recently.  Patient said he had good urinary flow and a good stream, but does note  that in the last day or so he has had some problems with dysuria,  although no hematuria or fever.   PHYSICAL EXAMINATION:  GENERAL:  Well-developed, well-nourished male.  He is sitting quietly on the bed.  He has a telemetry hooked up.  HEENT:  Normocephalic, atraumatic.  Cranial nerves II-XII are grossly  intact.  NECK:  Supple, no adenopathy, thyromegaly.  LUNGS:  Respirations  are normal.  HEART:  Regular rate and rhythm, no murmurs, rubs, gallops present.  ABDOMEN:  Soft, nontender, no palpable mass, rebound or guarding.  He  does have some left flank pain, although it is a little bit lower down  than were one would normally expect for classic left flank pain.  GU:  Not performed, but will be done at the time of his planned  cystoscopy.  EXTREMITIES:  No cyanosis, clubbing, or edema.  NEURO:  Vascular systems were intact.   LABORATORY DATA:  The patient has a BUN of 37, creatinine 2.9.  Laboratories otherwise unremarkable.  He has a normal white count and is  not anemic.  His urinalysis does not show any evidence of infection.  As  noted above, he had a renal ultrasound, which showed that there were  some stones in the possible area of the upper pole.  It is unclear if  there is a kidney on the right hand side.   IMPRESSION:  1. Renal insufficiency.  2. 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  3. 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   PLAN:  1. Noncontrast CT to delineate anatomy further, and in particular see      if there is an ectopic right kidney and also to determine if there      are any stones along the course of the ureter.  2. Cystoscopy, retrogrades and stent placement with possible stenting      of both poles of the left kidney if obstructed.  Will schedule the      patient to do this on Sunday.           ______________________________  Domingo Pulse, M.D.  Electronically Signed     RJE/MEDQ  D:  10/23/2006  T:  10/24/2006  Job:  [id number]

## 2011-10-18 DIAGNOSIS — Z886 Allergy status to analgesic agent status: Secondary | ICD-10-CM | POA: Diagnosis not present

## 2011-10-18 DIAGNOSIS — E2749 Other adrenocortical insufficiency: Secondary | ICD-10-CM | POA: Diagnosis not present

## 2011-10-18 DIAGNOSIS — F329 Major depressive disorder, single episode, unspecified: Secondary | ICD-10-CM | POA: Diagnosis present

## 2011-10-18 DIAGNOSIS — I5032 Chronic diastolic (congestive) heart failure: Secondary | ICD-10-CM | POA: Diagnosis not present

## 2011-10-18 DIAGNOSIS — Z954 Presence of other heart-valve replacement: Secondary | ICD-10-CM | POA: Diagnosis not present

## 2011-10-18 DIAGNOSIS — N039 Chronic nephritic syndrome with unspecified morphologic changes: Secondary | ICD-10-CM | POA: Diagnosis not present

## 2011-10-18 DIAGNOSIS — Z7982 Long term (current) use of aspirin: Secondary | ICD-10-CM | POA: Diagnosis not present

## 2011-10-18 DIAGNOSIS — D696 Thrombocytopenia, unspecified: Secondary | ICD-10-CM | POA: Diagnosis present

## 2011-10-18 DIAGNOSIS — Z905 Acquired absence of kidney: Secondary | ICD-10-CM | POA: Diagnosis not present

## 2011-10-18 DIAGNOSIS — J449 Chronic obstructive pulmonary disease, unspecified: Secondary | ICD-10-CM | POA: Diagnosis present

## 2011-10-18 DIAGNOSIS — Z79899 Other long term (current) drug therapy: Secondary | ICD-10-CM | POA: Diagnosis not present

## 2011-10-18 DIAGNOSIS — I509 Heart failure, unspecified: Secondary | ICD-10-CM | POA: Diagnosis present

## 2011-10-18 DIAGNOSIS — N186 End stage renal disease: Secondary | ICD-10-CM | POA: Diagnosis present

## 2011-10-18 DIAGNOSIS — D638 Anemia in other chronic diseases classified elsewhere: Secondary | ICD-10-CM | POA: Diagnosis present

## 2011-10-18 DIAGNOSIS — E119 Type 2 diabetes mellitus without complications: Secondary | ICD-10-CM | POA: Diagnosis present

## 2011-10-18 DIAGNOSIS — J189 Pneumonia, unspecified organism: Secondary | ICD-10-CM | POA: Diagnosis not present

## 2011-10-18 DIAGNOSIS — I120 Hypertensive chronic kidney disease with stage 5 chronic kidney disease or end stage renal disease: Secondary | ICD-10-CM | POA: Diagnosis not present

## 2011-10-18 DIAGNOSIS — Z992 Dependence on renal dialysis: Secondary | ICD-10-CM | POA: Diagnosis not present

## 2011-10-18 DIAGNOSIS — I10 Essential (primary) hypertension: Secondary | ICD-10-CM | POA: Diagnosis not present

## 2011-10-18 DIAGNOSIS — F17200 Nicotine dependence, unspecified, uncomplicated: Secondary | ICD-10-CM | POA: Diagnosis present

## 2011-10-18 DIAGNOSIS — D649 Anemia, unspecified: Secondary | ICD-10-CM | POA: Diagnosis present

## 2011-10-24 DIAGNOSIS — D509 Iron deficiency anemia, unspecified: Secondary | ICD-10-CM | POA: Diagnosis not present

## 2011-10-24 DIAGNOSIS — D631 Anemia in chronic kidney disease: Secondary | ICD-10-CM | POA: Diagnosis not present

## 2011-10-24 DIAGNOSIS — N039 Chronic nephritic syndrome with unspecified morphologic changes: Secondary | ICD-10-CM | POA: Diagnosis not present

## 2011-10-24 DIAGNOSIS — N186 End stage renal disease: Secondary | ICD-10-CM | POA: Diagnosis not present

## 2011-10-27 DIAGNOSIS — D631 Anemia in chronic kidney disease: Secondary | ICD-10-CM | POA: Diagnosis not present

## 2011-10-27 DIAGNOSIS — D509 Iron deficiency anemia, unspecified: Secondary | ICD-10-CM | POA: Diagnosis not present

## 2011-10-27 DIAGNOSIS — N186 End stage renal disease: Secondary | ICD-10-CM | POA: Diagnosis not present

## 2011-10-29 DIAGNOSIS — N039 Chronic nephritic syndrome with unspecified morphologic changes: Secondary | ICD-10-CM | POA: Diagnosis not present

## 2011-10-29 DIAGNOSIS — D509 Iron deficiency anemia, unspecified: Secondary | ICD-10-CM | POA: Diagnosis not present

## 2011-10-29 DIAGNOSIS — N186 End stage renal disease: Secondary | ICD-10-CM | POA: Diagnosis not present

## 2011-10-31 DIAGNOSIS — N186 End stage renal disease: Secondary | ICD-10-CM | POA: Diagnosis not present

## 2011-10-31 DIAGNOSIS — D631 Anemia in chronic kidney disease: Secondary | ICD-10-CM | POA: Diagnosis not present

## 2011-10-31 DIAGNOSIS — D509 Iron deficiency anemia, unspecified: Secondary | ICD-10-CM | POA: Diagnosis not present

## 2011-11-03 DIAGNOSIS — D631 Anemia in chronic kidney disease: Secondary | ICD-10-CM | POA: Diagnosis not present

## 2011-11-03 DIAGNOSIS — N186 End stage renal disease: Secondary | ICD-10-CM | POA: Diagnosis not present

## 2011-11-03 DIAGNOSIS — D509 Iron deficiency anemia, unspecified: Secondary | ICD-10-CM | POA: Diagnosis not present

## 2011-11-04 DIAGNOSIS — I10 Essential (primary) hypertension: Secondary | ICD-10-CM | POA: Diagnosis not present

## 2011-11-04 DIAGNOSIS — M549 Dorsalgia, unspecified: Secondary | ICD-10-CM | POA: Diagnosis not present

## 2011-11-04 DIAGNOSIS — N186 End stage renal disease: Secondary | ICD-10-CM | POA: Diagnosis not present

## 2011-11-04 DIAGNOSIS — M109 Gout, unspecified: Secondary | ICD-10-CM | POA: Diagnosis not present

## 2011-11-04 DIAGNOSIS — B953 Streptococcus pneumoniae as the cause of diseases classified elsewhere: Secondary | ICD-10-CM | POA: Diagnosis not present

## 2011-11-05 DIAGNOSIS — D509 Iron deficiency anemia, unspecified: Secondary | ICD-10-CM | POA: Diagnosis not present

## 2011-11-05 DIAGNOSIS — N039 Chronic nephritic syndrome with unspecified morphologic changes: Secondary | ICD-10-CM | POA: Diagnosis not present

## 2011-11-05 DIAGNOSIS — N186 End stage renal disease: Secondary | ICD-10-CM | POA: Diagnosis not present

## 2011-11-08 DIAGNOSIS — D509 Iron deficiency anemia, unspecified: Secondary | ICD-10-CM | POA: Diagnosis not present

## 2011-11-08 DIAGNOSIS — N186 End stage renal disease: Secondary | ICD-10-CM | POA: Diagnosis not present

## 2011-11-08 DIAGNOSIS — N039 Chronic nephritic syndrome with unspecified morphologic changes: Secondary | ICD-10-CM | POA: Diagnosis not present

## 2011-11-10 DIAGNOSIS — D631 Anemia in chronic kidney disease: Secondary | ICD-10-CM | POA: Diagnosis not present

## 2011-11-10 DIAGNOSIS — N186 End stage renal disease: Secondary | ICD-10-CM | POA: Diagnosis not present

## 2011-11-10 DIAGNOSIS — D509 Iron deficiency anemia, unspecified: Secondary | ICD-10-CM | POA: Diagnosis not present

## 2011-11-12 DIAGNOSIS — D631 Anemia in chronic kidney disease: Secondary | ICD-10-CM | POA: Diagnosis not present

## 2011-11-12 DIAGNOSIS — N186 End stage renal disease: Secondary | ICD-10-CM | POA: Diagnosis not present

## 2011-11-12 DIAGNOSIS — D509 Iron deficiency anemia, unspecified: Secondary | ICD-10-CM | POA: Diagnosis not present

## 2011-11-14 DIAGNOSIS — N186 End stage renal disease: Secondary | ICD-10-CM | POA: Diagnosis not present

## 2011-11-14 DIAGNOSIS — N039 Chronic nephritic syndrome with unspecified morphologic changes: Secondary | ICD-10-CM | POA: Diagnosis not present

## 2011-11-14 DIAGNOSIS — D509 Iron deficiency anemia, unspecified: Secondary | ICD-10-CM | POA: Diagnosis not present

## 2011-11-17 DIAGNOSIS — D509 Iron deficiency anemia, unspecified: Secondary | ICD-10-CM | POA: Diagnosis not present

## 2011-11-17 DIAGNOSIS — N186 End stage renal disease: Secondary | ICD-10-CM | POA: Diagnosis not present

## 2011-11-17 DIAGNOSIS — N039 Chronic nephritic syndrome with unspecified morphologic changes: Secondary | ICD-10-CM | POA: Diagnosis not present

## 2011-11-19 DIAGNOSIS — N186 End stage renal disease: Secondary | ICD-10-CM | POA: Diagnosis not present

## 2011-11-19 DIAGNOSIS — N039 Chronic nephritic syndrome with unspecified morphologic changes: Secondary | ICD-10-CM | POA: Diagnosis not present

## 2011-11-19 DIAGNOSIS — D509 Iron deficiency anemia, unspecified: Secondary | ICD-10-CM | POA: Diagnosis not present

## 2011-11-20 DIAGNOSIS — N186 End stage renal disease: Secondary | ICD-10-CM | POA: Diagnosis not present

## 2011-11-21 DIAGNOSIS — D509 Iron deficiency anemia, unspecified: Secondary | ICD-10-CM | POA: Diagnosis not present

## 2011-11-21 DIAGNOSIS — D631 Anemia in chronic kidney disease: Secondary | ICD-10-CM | POA: Diagnosis not present

## 2011-11-21 DIAGNOSIS — N186 End stage renal disease: Secondary | ICD-10-CM | POA: Diagnosis not present

## 2011-12-18 DIAGNOSIS — N186 End stage renal disease: Secondary | ICD-10-CM | POA: Diagnosis not present

## 2011-12-19 DIAGNOSIS — D631 Anemia in chronic kidney disease: Secondary | ICD-10-CM | POA: Diagnosis not present

## 2011-12-19 DIAGNOSIS — N186 End stage renal disease: Secondary | ICD-10-CM | POA: Diagnosis not present

## 2011-12-19 DIAGNOSIS — D509 Iron deficiency anemia, unspecified: Secondary | ICD-10-CM | POA: Diagnosis not present

## 2011-12-19 DIAGNOSIS — Z992 Dependence on renal dialysis: Secondary | ICD-10-CM | POA: Diagnosis not present

## 2012-01-01 DIAGNOSIS — M109 Gout, unspecified: Secondary | ICD-10-CM | POA: Diagnosis not present

## 2012-01-01 DIAGNOSIS — N186 End stage renal disease: Secondary | ICD-10-CM | POA: Diagnosis not present

## 2012-01-01 DIAGNOSIS — K250 Acute gastric ulcer with hemorrhage: Secondary | ICD-10-CM | POA: Diagnosis not present

## 2012-01-01 DIAGNOSIS — B953 Streptococcus pneumoniae as the cause of diseases classified elsewhere: Secondary | ICD-10-CM | POA: Diagnosis not present

## 2012-01-01 DIAGNOSIS — I10 Essential (primary) hypertension: Secondary | ICD-10-CM | POA: Diagnosis not present

## 2012-01-01 DIAGNOSIS — M549 Dorsalgia, unspecified: Secondary | ICD-10-CM | POA: Diagnosis not present

## 2012-01-07 DIAGNOSIS — N186 End stage renal disease: Secondary | ICD-10-CM | POA: Diagnosis not present

## 2012-01-18 DIAGNOSIS — N186 End stage renal disease: Secondary | ICD-10-CM | POA: Diagnosis not present

## 2012-01-19 DIAGNOSIS — D509 Iron deficiency anemia, unspecified: Secondary | ICD-10-CM | POA: Diagnosis not present

## 2012-01-19 DIAGNOSIS — N039 Chronic nephritic syndrome with unspecified morphologic changes: Secondary | ICD-10-CM | POA: Diagnosis not present

## 2012-01-19 DIAGNOSIS — R509 Fever, unspecified: Secondary | ICD-10-CM | POA: Diagnosis not present

## 2012-01-19 DIAGNOSIS — J1529 Pneumonia due to other staphylococcus: Secondary | ICD-10-CM | POA: Diagnosis not present

## 2012-01-19 DIAGNOSIS — N186 End stage renal disease: Secondary | ICD-10-CM | POA: Diagnosis not present

## 2012-01-19 DIAGNOSIS — D631 Anemia in chronic kidney disease: Secondary | ICD-10-CM | POA: Diagnosis not present

## 2012-01-21 DIAGNOSIS — D509 Iron deficiency anemia, unspecified: Secondary | ICD-10-CM | POA: Diagnosis not present

## 2012-01-21 DIAGNOSIS — N039 Chronic nephritic syndrome with unspecified morphologic changes: Secondary | ICD-10-CM | POA: Diagnosis not present

## 2012-01-21 DIAGNOSIS — J1529 Pneumonia due to other staphylococcus: Secondary | ICD-10-CM | POA: Diagnosis not present

## 2012-01-21 DIAGNOSIS — R509 Fever, unspecified: Secondary | ICD-10-CM | POA: Diagnosis not present

## 2012-01-21 DIAGNOSIS — N186 End stage renal disease: Secondary | ICD-10-CM | POA: Diagnosis not present

## 2012-01-23 DIAGNOSIS — J1529 Pneumonia due to other staphylococcus: Secondary | ICD-10-CM | POA: Diagnosis not present

## 2012-01-23 DIAGNOSIS — N039 Chronic nephritic syndrome with unspecified morphologic changes: Secondary | ICD-10-CM | POA: Diagnosis not present

## 2012-01-23 DIAGNOSIS — R509 Fever, unspecified: Secondary | ICD-10-CM | POA: Diagnosis not present

## 2012-01-23 DIAGNOSIS — D509 Iron deficiency anemia, unspecified: Secondary | ICD-10-CM | POA: Diagnosis not present

## 2012-01-23 DIAGNOSIS — N186 End stage renal disease: Secondary | ICD-10-CM | POA: Diagnosis not present

## 2012-01-26 DIAGNOSIS — A411 Sepsis due to other specified staphylococcus: Secondary | ICD-10-CM | POA: Diagnosis present

## 2012-01-26 DIAGNOSIS — I359 Nonrheumatic aortic valve disorder, unspecified: Secondary | ICD-10-CM | POA: Diagnosis present

## 2012-01-26 DIAGNOSIS — R509 Fever, unspecified: Secondary | ICD-10-CM | POA: Diagnosis not present

## 2012-01-26 DIAGNOSIS — N200 Calculus of kidney: Secondary | ICD-10-CM | POA: Diagnosis not present

## 2012-01-26 DIAGNOSIS — N039 Chronic nephritic syndrome with unspecified morphologic changes: Secondary | ICD-10-CM | POA: Diagnosis not present

## 2012-01-26 DIAGNOSIS — Z7982 Long term (current) use of aspirin: Secondary | ICD-10-CM | POA: Diagnosis not present

## 2012-01-26 DIAGNOSIS — Z885 Allergy status to narcotic agent status: Secondary | ICD-10-CM | POA: Diagnosis not present

## 2012-01-26 DIAGNOSIS — I10 Essential (primary) hypertension: Secondary | ICD-10-CM | POA: Diagnosis not present

## 2012-01-26 DIAGNOSIS — Z905 Acquired absence of kidney: Secondary | ICD-10-CM | POA: Diagnosis not present

## 2012-01-26 DIAGNOSIS — I509 Heart failure, unspecified: Secondary | ICD-10-CM | POA: Diagnosis not present

## 2012-01-26 DIAGNOSIS — E1129 Type 2 diabetes mellitus with other diabetic kidney complication: Secondary | ICD-10-CM | POA: Diagnosis not present

## 2012-01-26 DIAGNOSIS — F411 Generalized anxiety disorder: Secondary | ICD-10-CM | POA: Diagnosis present

## 2012-01-26 DIAGNOSIS — Z8249 Family history of ischemic heart disease and other diseases of the circulatory system: Secondary | ICD-10-CM | POA: Diagnosis not present

## 2012-01-26 DIAGNOSIS — F17200 Nicotine dependence, unspecified, uncomplicated: Secondary | ICD-10-CM | POA: Diagnosis present

## 2012-01-26 DIAGNOSIS — Z79899 Other long term (current) drug therapy: Secondary | ICD-10-CM | POA: Diagnosis not present

## 2012-01-26 DIAGNOSIS — T82897A Other specified complication of cardiac prosthetic devices, implants and grafts, initial encounter: Secondary | ICD-10-CM | POA: Diagnosis present

## 2012-01-26 DIAGNOSIS — J449 Chronic obstructive pulmonary disease, unspecified: Secondary | ICD-10-CM | POA: Diagnosis present

## 2012-01-26 DIAGNOSIS — R0789 Other chest pain: Secondary | ICD-10-CM | POA: Diagnosis not present

## 2012-01-26 DIAGNOSIS — J1529 Pneumonia due to other staphylococcus: Secondary | ICD-10-CM | POA: Diagnosis not present

## 2012-01-26 DIAGNOSIS — I498 Other specified cardiac arrhythmias: Secondary | ICD-10-CM | POA: Diagnosis not present

## 2012-01-26 DIAGNOSIS — F329 Major depressive disorder, single episode, unspecified: Secondary | ICD-10-CM | POA: Diagnosis present

## 2012-01-26 DIAGNOSIS — J189 Pneumonia, unspecified organism: Secondary | ICD-10-CM | POA: Diagnosis not present

## 2012-01-26 DIAGNOSIS — N186 End stage renal disease: Secondary | ICD-10-CM | POA: Diagnosis present

## 2012-01-26 DIAGNOSIS — I129 Hypertensive chronic kidney disease with stage 1 through stage 4 chronic kidney disease, or unspecified chronic kidney disease: Secondary | ICD-10-CM | POA: Diagnosis not present

## 2012-01-26 DIAGNOSIS — A419 Sepsis, unspecified organism: Secondary | ICD-10-CM | POA: Diagnosis present

## 2012-01-26 DIAGNOSIS — R0602 Shortness of breath: Secondary | ICD-10-CM | POA: Diagnosis not present

## 2012-01-26 DIAGNOSIS — I120 Hypertensive chronic kidney disease with stage 5 chronic kidney disease or end stage renal disease: Secondary | ICD-10-CM | POA: Diagnosis not present

## 2012-01-26 DIAGNOSIS — Z992 Dependence on renal dialysis: Secondary | ICD-10-CM | POA: Diagnosis not present

## 2012-01-26 DIAGNOSIS — E8779 Other fluid overload: Secondary | ICD-10-CM | POA: Diagnosis present

## 2012-01-26 DIAGNOSIS — E119 Type 2 diabetes mellitus without complications: Secondary | ICD-10-CM | POA: Diagnosis present

## 2012-01-26 DIAGNOSIS — E2749 Other adrenocortical insufficiency: Secondary | ICD-10-CM | POA: Diagnosis not present

## 2012-01-26 DIAGNOSIS — N189 Chronic kidney disease, unspecified: Secondary | ICD-10-CM | POA: Diagnosis not present

## 2012-01-26 DIAGNOSIS — Z87442 Personal history of urinary calculi: Secondary | ICD-10-CM | POA: Diagnosis not present

## 2012-01-26 DIAGNOSIS — I700 Atherosclerosis of aorta: Secondary | ICD-10-CM | POA: Diagnosis not present

## 2012-01-26 DIAGNOSIS — D649 Anemia, unspecified: Secondary | ICD-10-CM | POA: Diagnosis present

## 2012-02-02 DIAGNOSIS — R509 Fever, unspecified: Secondary | ICD-10-CM | POA: Diagnosis not present

## 2012-02-02 DIAGNOSIS — D631 Anemia in chronic kidney disease: Secondary | ICD-10-CM | POA: Diagnosis not present

## 2012-02-02 DIAGNOSIS — N186 End stage renal disease: Secondary | ICD-10-CM | POA: Diagnosis not present

## 2012-02-02 DIAGNOSIS — J1529 Pneumonia due to other staphylococcus: Secondary | ICD-10-CM | POA: Diagnosis not present

## 2012-02-02 DIAGNOSIS — D509 Iron deficiency anemia, unspecified: Secondary | ICD-10-CM | POA: Diagnosis not present

## 2012-02-04 DIAGNOSIS — R509 Fever, unspecified: Secondary | ICD-10-CM | POA: Diagnosis not present

## 2012-02-04 DIAGNOSIS — D509 Iron deficiency anemia, unspecified: Secondary | ICD-10-CM | POA: Diagnosis not present

## 2012-02-04 DIAGNOSIS — J1529 Pneumonia due to other staphylococcus: Secondary | ICD-10-CM | POA: Diagnosis not present

## 2012-02-04 DIAGNOSIS — N039 Chronic nephritic syndrome with unspecified morphologic changes: Secondary | ICD-10-CM | POA: Diagnosis not present

## 2012-02-04 DIAGNOSIS — N186 End stage renal disease: Secondary | ICD-10-CM | POA: Diagnosis not present

## 2012-02-05 DIAGNOSIS — Z954 Presence of other heart-valve replacement: Secondary | ICD-10-CM | POA: Diagnosis not present

## 2012-02-06 DIAGNOSIS — D631 Anemia in chronic kidney disease: Secondary | ICD-10-CM | POA: Diagnosis not present

## 2012-02-06 DIAGNOSIS — Z5181 Encounter for therapeutic drug level monitoring: Secondary | ICD-10-CM | POA: Diagnosis not present

## 2012-02-06 DIAGNOSIS — J1529 Pneumonia due to other staphylococcus: Secondary | ICD-10-CM | POA: Diagnosis not present

## 2012-02-06 DIAGNOSIS — D509 Iron deficiency anemia, unspecified: Secondary | ICD-10-CM | POA: Diagnosis not present

## 2012-02-06 DIAGNOSIS — R509 Fever, unspecified: Secondary | ICD-10-CM | POA: Diagnosis not present

## 2012-02-06 DIAGNOSIS — N186 End stage renal disease: Secondary | ICD-10-CM | POA: Diagnosis not present

## 2012-02-09 DIAGNOSIS — F329 Major depressive disorder, single episode, unspecified: Secondary | ICD-10-CM | POA: Diagnosis present

## 2012-02-09 DIAGNOSIS — I4460 Unspecified fascicular block: Secondary | ICD-10-CM | POA: Diagnosis not present

## 2012-02-09 DIAGNOSIS — I440 Atrioventricular block, first degree: Secondary | ICD-10-CM | POA: Diagnosis not present

## 2012-02-09 DIAGNOSIS — T8209XA Other mechanical complication of heart valve prosthesis, initial encounter: Secondary | ICD-10-CM | POA: Diagnosis present

## 2012-02-09 DIAGNOSIS — Z885 Allergy status to narcotic agent status: Secondary | ICD-10-CM | POA: Diagnosis not present

## 2012-02-09 DIAGNOSIS — D631 Anemia in chronic kidney disease: Secondary | ICD-10-CM | POA: Diagnosis not present

## 2012-02-09 DIAGNOSIS — K219 Gastro-esophageal reflux disease without esophagitis: Secondary | ICD-10-CM | POA: Diagnosis present

## 2012-02-09 DIAGNOSIS — J449 Chronic obstructive pulmonary disease, unspecified: Secondary | ICD-10-CM | POA: Diagnosis present

## 2012-02-09 DIAGNOSIS — Z8249 Family history of ischemic heart disease and other diseases of the circulatory system: Secondary | ICD-10-CM | POA: Diagnosis not present

## 2012-02-09 DIAGNOSIS — I359 Nonrheumatic aortic valve disorder, unspecified: Secondary | ICD-10-CM | POA: Diagnosis present

## 2012-02-09 DIAGNOSIS — R509 Fever, unspecified: Secondary | ICD-10-CM | POA: Diagnosis not present

## 2012-02-09 DIAGNOSIS — F411 Generalized anxiety disorder: Secondary | ICD-10-CM | POA: Diagnosis present

## 2012-02-09 DIAGNOSIS — J9819 Other pulmonary collapse: Secondary | ICD-10-CM | POA: Diagnosis not present

## 2012-02-09 DIAGNOSIS — R5383 Other fatigue: Secondary | ICD-10-CM | POA: Diagnosis not present

## 2012-02-09 DIAGNOSIS — Z905 Acquired absence of kidney: Secondary | ICD-10-CM | POA: Diagnosis not present

## 2012-02-09 DIAGNOSIS — R918 Other nonspecific abnormal finding of lung field: Secondary | ICD-10-CM | POA: Diagnosis not present

## 2012-02-09 DIAGNOSIS — J1529 Pneumonia due to other staphylococcus: Secondary | ICD-10-CM | POA: Diagnosis not present

## 2012-02-09 DIAGNOSIS — Z87891 Personal history of nicotine dependence: Secondary | ICD-10-CM | POA: Diagnosis not present

## 2012-02-09 DIAGNOSIS — N2581 Secondary hyperparathyroidism of renal origin: Secondary | ICD-10-CM | POA: Diagnosis present

## 2012-02-09 DIAGNOSIS — D509 Iron deficiency anemia, unspecified: Secondary | ICD-10-CM | POA: Diagnosis not present

## 2012-02-09 DIAGNOSIS — D62 Acute posthemorrhagic anemia: Secondary | ICD-10-CM | POA: Diagnosis not present

## 2012-02-09 DIAGNOSIS — Z992 Dependence on renal dialysis: Secondary | ICD-10-CM | POA: Diagnosis not present

## 2012-02-09 DIAGNOSIS — Z01811 Encounter for preprocedural respiratory examination: Secondary | ICD-10-CM | POA: Diagnosis not present

## 2012-02-09 DIAGNOSIS — D696 Thrombocytopenia, unspecified: Secondary | ICD-10-CM | POA: Diagnosis not present

## 2012-02-09 DIAGNOSIS — N186 End stage renal disease: Secondary | ICD-10-CM | POA: Diagnosis present

## 2012-02-09 DIAGNOSIS — E875 Hyperkalemia: Secondary | ICD-10-CM | POA: Diagnosis not present

## 2012-02-09 DIAGNOSIS — R0789 Other chest pain: Secondary | ICD-10-CM | POA: Diagnosis not present

## 2012-02-09 DIAGNOSIS — D649 Anemia, unspecified: Secondary | ICD-10-CM | POA: Diagnosis not present

## 2012-02-09 DIAGNOSIS — D72829 Elevated white blood cell count, unspecified: Secondary | ICD-10-CM | POA: Diagnosis not present

## 2012-02-09 DIAGNOSIS — I1311 Hypertensive heart and chronic kidney disease without heart failure, with stage 5 chronic kidney disease, or end stage renal disease: Secondary | ICD-10-CM | POA: Diagnosis not present

## 2012-02-16 DIAGNOSIS — N039 Chronic nephritic syndrome with unspecified morphologic changes: Secondary | ICD-10-CM | POA: Diagnosis not present

## 2012-02-16 DIAGNOSIS — D509 Iron deficiency anemia, unspecified: Secondary | ICD-10-CM | POA: Diagnosis not present

## 2012-02-16 DIAGNOSIS — N186 End stage renal disease: Secondary | ICD-10-CM | POA: Diagnosis not present

## 2012-02-16 DIAGNOSIS — R509 Fever, unspecified: Secondary | ICD-10-CM | POA: Diagnosis not present

## 2012-02-16 DIAGNOSIS — J1529 Pneumonia due to other staphylococcus: Secondary | ICD-10-CM | POA: Diagnosis not present

## 2012-02-17 DIAGNOSIS — N186 End stage renal disease: Secondary | ICD-10-CM | POA: Diagnosis not present

## 2012-02-20 DIAGNOSIS — N186 End stage renal disease: Secondary | ICD-10-CM | POA: Diagnosis not present

## 2012-02-20 DIAGNOSIS — D631 Anemia in chronic kidney disease: Secondary | ICD-10-CM | POA: Diagnosis not present

## 2012-02-20 DIAGNOSIS — J1529 Pneumonia due to other staphylococcus: Secondary | ICD-10-CM | POA: Diagnosis not present

## 2012-02-20 DIAGNOSIS — D509 Iron deficiency anemia, unspecified: Secondary | ICD-10-CM | POA: Diagnosis not present

## 2012-02-20 DIAGNOSIS — R509 Fever, unspecified: Secondary | ICD-10-CM | POA: Diagnosis not present

## 2012-02-20 DIAGNOSIS — N039 Chronic nephritic syndrome with unspecified morphologic changes: Secondary | ICD-10-CM | POA: Diagnosis not present

## 2012-02-23 DIAGNOSIS — J1529 Pneumonia due to other staphylococcus: Secondary | ICD-10-CM | POA: Diagnosis not present

## 2012-02-23 DIAGNOSIS — D631 Anemia in chronic kidney disease: Secondary | ICD-10-CM | POA: Diagnosis not present

## 2012-02-23 DIAGNOSIS — D509 Iron deficiency anemia, unspecified: Secondary | ICD-10-CM | POA: Diagnosis not present

## 2012-02-23 DIAGNOSIS — R509 Fever, unspecified: Secondary | ICD-10-CM | POA: Diagnosis not present

## 2012-02-23 DIAGNOSIS — N186 End stage renal disease: Secondary | ICD-10-CM | POA: Diagnosis not present

## 2012-02-25 DIAGNOSIS — N039 Chronic nephritic syndrome with unspecified morphologic changes: Secondary | ICD-10-CM | POA: Diagnosis not present

## 2012-02-25 DIAGNOSIS — N186 End stage renal disease: Secondary | ICD-10-CM | POA: Diagnosis not present

## 2012-02-25 DIAGNOSIS — D509 Iron deficiency anemia, unspecified: Secondary | ICD-10-CM | POA: Diagnosis not present

## 2012-02-25 DIAGNOSIS — J1529 Pneumonia due to other staphylococcus: Secondary | ICD-10-CM | POA: Diagnosis not present

## 2012-02-25 DIAGNOSIS — R509 Fever, unspecified: Secondary | ICD-10-CM | POA: Diagnosis not present

## 2012-02-27 DIAGNOSIS — J1529 Pneumonia due to other staphylococcus: Secondary | ICD-10-CM | POA: Diagnosis not present

## 2012-02-27 DIAGNOSIS — N039 Chronic nephritic syndrome with unspecified morphologic changes: Secondary | ICD-10-CM | POA: Diagnosis not present

## 2012-02-27 DIAGNOSIS — D509 Iron deficiency anemia, unspecified: Secondary | ICD-10-CM | POA: Diagnosis not present

## 2012-02-27 DIAGNOSIS — N186 End stage renal disease: Secondary | ICD-10-CM | POA: Diagnosis not present

## 2012-02-27 DIAGNOSIS — R509 Fever, unspecified: Secondary | ICD-10-CM | POA: Diagnosis not present

## 2012-03-01 DIAGNOSIS — J1529 Pneumonia due to other staphylococcus: Secondary | ICD-10-CM | POA: Diagnosis not present

## 2012-03-01 DIAGNOSIS — R509 Fever, unspecified: Secondary | ICD-10-CM | POA: Diagnosis not present

## 2012-03-01 DIAGNOSIS — D509 Iron deficiency anemia, unspecified: Secondary | ICD-10-CM | POA: Diagnosis not present

## 2012-03-01 DIAGNOSIS — N186 End stage renal disease: Secondary | ICD-10-CM | POA: Diagnosis not present

## 2012-03-01 DIAGNOSIS — D631 Anemia in chronic kidney disease: Secondary | ICD-10-CM | POA: Diagnosis not present

## 2012-03-03 DIAGNOSIS — J1529 Pneumonia due to other staphylococcus: Secondary | ICD-10-CM | POA: Diagnosis not present

## 2012-03-03 DIAGNOSIS — N186 End stage renal disease: Secondary | ICD-10-CM | POA: Diagnosis not present

## 2012-03-03 DIAGNOSIS — R509 Fever, unspecified: Secondary | ICD-10-CM | POA: Diagnosis not present

## 2012-03-03 DIAGNOSIS — N039 Chronic nephritic syndrome with unspecified morphologic changes: Secondary | ICD-10-CM | POA: Diagnosis not present

## 2012-03-03 DIAGNOSIS — D509 Iron deficiency anemia, unspecified: Secondary | ICD-10-CM | POA: Diagnosis not present

## 2012-03-05 DIAGNOSIS — N039 Chronic nephritic syndrome with unspecified morphologic changes: Secondary | ICD-10-CM | POA: Diagnosis not present

## 2012-03-05 DIAGNOSIS — D509 Iron deficiency anemia, unspecified: Secondary | ICD-10-CM | POA: Diagnosis not present

## 2012-03-05 DIAGNOSIS — J1529 Pneumonia due to other staphylococcus: Secondary | ICD-10-CM | POA: Diagnosis not present

## 2012-03-05 DIAGNOSIS — N186 End stage renal disease: Secondary | ICD-10-CM | POA: Diagnosis not present

## 2012-03-05 DIAGNOSIS — R509 Fever, unspecified: Secondary | ICD-10-CM | POA: Diagnosis not present

## 2012-03-08 DIAGNOSIS — D631 Anemia in chronic kidney disease: Secondary | ICD-10-CM | POA: Diagnosis not present

## 2012-03-08 DIAGNOSIS — R509 Fever, unspecified: Secondary | ICD-10-CM | POA: Diagnosis not present

## 2012-03-08 DIAGNOSIS — N186 End stage renal disease: Secondary | ICD-10-CM | POA: Diagnosis not present

## 2012-03-08 DIAGNOSIS — J1529 Pneumonia due to other staphylococcus: Secondary | ICD-10-CM | POA: Diagnosis not present

## 2012-03-08 DIAGNOSIS — D509 Iron deficiency anemia, unspecified: Secondary | ICD-10-CM | POA: Diagnosis not present

## 2012-03-09 DIAGNOSIS — I10 Essential (primary) hypertension: Secondary | ICD-10-CM | POA: Diagnosis not present

## 2012-03-10 DIAGNOSIS — R509 Fever, unspecified: Secondary | ICD-10-CM | POA: Diagnosis not present

## 2012-03-10 DIAGNOSIS — J1529 Pneumonia due to other staphylococcus: Secondary | ICD-10-CM | POA: Diagnosis not present

## 2012-03-10 DIAGNOSIS — D631 Anemia in chronic kidney disease: Secondary | ICD-10-CM | POA: Diagnosis not present

## 2012-03-10 DIAGNOSIS — N186 End stage renal disease: Secondary | ICD-10-CM | POA: Diagnosis not present

## 2012-03-10 DIAGNOSIS — D509 Iron deficiency anemia, unspecified: Secondary | ICD-10-CM | POA: Diagnosis not present

## 2012-03-11 DIAGNOSIS — Z79899 Other long term (current) drug therapy: Secondary | ICD-10-CM | POA: Diagnosis not present

## 2012-03-11 DIAGNOSIS — Z992 Dependence on renal dialysis: Secondary | ICD-10-CM | POA: Diagnosis not present

## 2012-03-11 DIAGNOSIS — Z954 Presence of other heart-valve replacement: Secondary | ICD-10-CM | POA: Diagnosis not present

## 2012-03-11 DIAGNOSIS — N186 End stage renal disease: Secondary | ICD-10-CM | POA: Diagnosis not present

## 2012-03-11 DIAGNOSIS — Z09 Encounter for follow-up examination after completed treatment for conditions other than malignant neoplasm: Secondary | ICD-10-CM | POA: Diagnosis not present

## 2012-03-11 DIAGNOSIS — Z7901 Long term (current) use of anticoagulants: Secondary | ICD-10-CM | POA: Diagnosis not present

## 2012-03-12 DIAGNOSIS — R509 Fever, unspecified: Secondary | ICD-10-CM | POA: Diagnosis not present

## 2012-03-12 DIAGNOSIS — D631 Anemia in chronic kidney disease: Secondary | ICD-10-CM | POA: Diagnosis not present

## 2012-03-12 DIAGNOSIS — N186 End stage renal disease: Secondary | ICD-10-CM | POA: Diagnosis not present

## 2012-03-12 DIAGNOSIS — J1529 Pneumonia due to other staphylococcus: Secondary | ICD-10-CM | POA: Diagnosis not present

## 2012-03-12 DIAGNOSIS — D509 Iron deficiency anemia, unspecified: Secondary | ICD-10-CM | POA: Diagnosis not present

## 2012-03-15 DIAGNOSIS — J1529 Pneumonia due to other staphylococcus: Secondary | ICD-10-CM | POA: Diagnosis not present

## 2012-03-15 DIAGNOSIS — D509 Iron deficiency anemia, unspecified: Secondary | ICD-10-CM | POA: Diagnosis not present

## 2012-03-15 DIAGNOSIS — N186 End stage renal disease: Secondary | ICD-10-CM | POA: Diagnosis not present

## 2012-03-15 DIAGNOSIS — N039 Chronic nephritic syndrome with unspecified morphologic changes: Secondary | ICD-10-CM | POA: Diagnosis not present

## 2012-03-15 DIAGNOSIS — R509 Fever, unspecified: Secondary | ICD-10-CM | POA: Diagnosis not present

## 2012-03-17 DIAGNOSIS — D631 Anemia in chronic kidney disease: Secondary | ICD-10-CM | POA: Diagnosis not present

## 2012-03-17 DIAGNOSIS — N186 End stage renal disease: Secondary | ICD-10-CM | POA: Diagnosis not present

## 2012-03-17 DIAGNOSIS — D509 Iron deficiency anemia, unspecified: Secondary | ICD-10-CM | POA: Diagnosis not present

## 2012-03-17 DIAGNOSIS — J1529 Pneumonia due to other staphylococcus: Secondary | ICD-10-CM | POA: Diagnosis not present

## 2012-03-17 DIAGNOSIS — R509 Fever, unspecified: Secondary | ICD-10-CM | POA: Diagnosis not present

## 2012-03-19 DIAGNOSIS — D509 Iron deficiency anemia, unspecified: Secondary | ICD-10-CM | POA: Diagnosis not present

## 2012-03-19 DIAGNOSIS — R509 Fever, unspecified: Secondary | ICD-10-CM | POA: Diagnosis not present

## 2012-03-19 DIAGNOSIS — N186 End stage renal disease: Secondary | ICD-10-CM | POA: Diagnosis not present

## 2012-03-19 DIAGNOSIS — J1529 Pneumonia due to other staphylococcus: Secondary | ICD-10-CM | POA: Diagnosis not present

## 2012-03-19 DIAGNOSIS — D631 Anemia in chronic kidney disease: Secondary | ICD-10-CM | POA: Diagnosis not present

## 2012-03-22 DIAGNOSIS — N039 Chronic nephritic syndrome with unspecified morphologic changes: Secondary | ICD-10-CM | POA: Diagnosis not present

## 2012-03-22 DIAGNOSIS — N186 End stage renal disease: Secondary | ICD-10-CM | POA: Diagnosis not present

## 2012-03-22 DIAGNOSIS — D509 Iron deficiency anemia, unspecified: Secondary | ICD-10-CM | POA: Diagnosis not present

## 2012-03-22 DIAGNOSIS — J1529 Pneumonia due to other staphylococcus: Secondary | ICD-10-CM | POA: Diagnosis not present

## 2012-03-24 DIAGNOSIS — N039 Chronic nephritic syndrome with unspecified morphologic changes: Secondary | ICD-10-CM | POA: Diagnosis not present

## 2012-03-24 DIAGNOSIS — J1529 Pneumonia due to other staphylococcus: Secondary | ICD-10-CM | POA: Diagnosis not present

## 2012-03-24 DIAGNOSIS — D509 Iron deficiency anemia, unspecified: Secondary | ICD-10-CM | POA: Diagnosis not present

## 2012-03-24 DIAGNOSIS — N186 End stage renal disease: Secondary | ICD-10-CM | POA: Diagnosis not present

## 2012-03-26 DIAGNOSIS — N186 End stage renal disease: Secondary | ICD-10-CM | POA: Diagnosis not present

## 2012-03-26 DIAGNOSIS — D509 Iron deficiency anemia, unspecified: Secondary | ICD-10-CM | POA: Diagnosis not present

## 2012-03-26 DIAGNOSIS — J1529 Pneumonia due to other staphylococcus: Secondary | ICD-10-CM | POA: Diagnosis not present

## 2012-03-26 DIAGNOSIS — N039 Chronic nephritic syndrome with unspecified morphologic changes: Secondary | ICD-10-CM | POA: Diagnosis not present

## 2012-03-29 DIAGNOSIS — N186 End stage renal disease: Secondary | ICD-10-CM | POA: Diagnosis not present

## 2012-03-29 DIAGNOSIS — J1529 Pneumonia due to other staphylococcus: Secondary | ICD-10-CM | POA: Diagnosis not present

## 2012-03-29 DIAGNOSIS — D631 Anemia in chronic kidney disease: Secondary | ICD-10-CM | POA: Diagnosis not present

## 2012-03-29 DIAGNOSIS — D509 Iron deficiency anemia, unspecified: Secondary | ICD-10-CM | POA: Diagnosis not present

## 2012-03-31 DIAGNOSIS — N186 End stage renal disease: Secondary | ICD-10-CM | POA: Diagnosis not present

## 2012-03-31 DIAGNOSIS — N039 Chronic nephritic syndrome with unspecified morphologic changes: Secondary | ICD-10-CM | POA: Diagnosis not present

## 2012-03-31 DIAGNOSIS — J1529 Pneumonia due to other staphylococcus: Secondary | ICD-10-CM | POA: Diagnosis not present

## 2012-03-31 DIAGNOSIS — D509 Iron deficiency anemia, unspecified: Secondary | ICD-10-CM | POA: Diagnosis not present

## 2012-04-02 DIAGNOSIS — J1529 Pneumonia due to other staphylococcus: Secondary | ICD-10-CM | POA: Diagnosis not present

## 2012-04-02 DIAGNOSIS — N186 End stage renal disease: Secondary | ICD-10-CM | POA: Diagnosis not present

## 2012-04-02 DIAGNOSIS — D509 Iron deficiency anemia, unspecified: Secondary | ICD-10-CM | POA: Diagnosis not present

## 2012-04-02 DIAGNOSIS — N039 Chronic nephritic syndrome with unspecified morphologic changes: Secondary | ICD-10-CM | POA: Diagnosis not present

## 2012-04-05 DIAGNOSIS — D631 Anemia in chronic kidney disease: Secondary | ICD-10-CM | POA: Diagnosis not present

## 2012-04-05 DIAGNOSIS — D509 Iron deficiency anemia, unspecified: Secondary | ICD-10-CM | POA: Diagnosis not present

## 2012-04-05 DIAGNOSIS — N186 End stage renal disease: Secondary | ICD-10-CM | POA: Diagnosis not present

## 2012-04-05 DIAGNOSIS — J1529 Pneumonia due to other staphylococcus: Secondary | ICD-10-CM | POA: Diagnosis not present

## 2012-04-07 DIAGNOSIS — N186 End stage renal disease: Secondary | ICD-10-CM | POA: Diagnosis not present

## 2012-04-07 DIAGNOSIS — D509 Iron deficiency anemia, unspecified: Secondary | ICD-10-CM | POA: Diagnosis not present

## 2012-04-07 DIAGNOSIS — J1529 Pneumonia due to other staphylococcus: Secondary | ICD-10-CM | POA: Diagnosis not present

## 2012-04-07 DIAGNOSIS — D631 Anemia in chronic kidney disease: Secondary | ICD-10-CM | POA: Diagnosis not present

## 2012-04-09 DIAGNOSIS — D631 Anemia in chronic kidney disease: Secondary | ICD-10-CM | POA: Diagnosis not present

## 2012-04-09 DIAGNOSIS — N186 End stage renal disease: Secondary | ICD-10-CM | POA: Diagnosis not present

## 2012-04-09 DIAGNOSIS — J1529 Pneumonia due to other staphylococcus: Secondary | ICD-10-CM | POA: Diagnosis not present

## 2012-04-09 DIAGNOSIS — D509 Iron deficiency anemia, unspecified: Secondary | ICD-10-CM | POA: Diagnosis not present

## 2012-04-12 DIAGNOSIS — N039 Chronic nephritic syndrome with unspecified morphologic changes: Secondary | ICD-10-CM | POA: Diagnosis not present

## 2012-04-12 DIAGNOSIS — D509 Iron deficiency anemia, unspecified: Secondary | ICD-10-CM | POA: Diagnosis not present

## 2012-04-12 DIAGNOSIS — N186 End stage renal disease: Secondary | ICD-10-CM | POA: Diagnosis not present

## 2012-04-12 DIAGNOSIS — J1529 Pneumonia due to other staphylococcus: Secondary | ICD-10-CM | POA: Diagnosis not present

## 2012-04-14 DIAGNOSIS — J1529 Pneumonia due to other staphylococcus: Secondary | ICD-10-CM | POA: Diagnosis not present

## 2012-04-14 DIAGNOSIS — D509 Iron deficiency anemia, unspecified: Secondary | ICD-10-CM | POA: Diagnosis not present

## 2012-04-14 DIAGNOSIS — N186 End stage renal disease: Secondary | ICD-10-CM | POA: Diagnosis not present

## 2012-04-14 DIAGNOSIS — D631 Anemia in chronic kidney disease: Secondary | ICD-10-CM | POA: Diagnosis not present

## 2012-04-16 DIAGNOSIS — N186 End stage renal disease: Secondary | ICD-10-CM | POA: Diagnosis not present

## 2012-04-16 DIAGNOSIS — D509 Iron deficiency anemia, unspecified: Secondary | ICD-10-CM | POA: Diagnosis not present

## 2012-04-16 DIAGNOSIS — D631 Anemia in chronic kidney disease: Secondary | ICD-10-CM | POA: Diagnosis not present

## 2012-04-16 DIAGNOSIS — J1529 Pneumonia due to other staphylococcus: Secondary | ICD-10-CM | POA: Diagnosis not present

## 2012-04-17 DIAGNOSIS — D631 Anemia in chronic kidney disease: Secondary | ICD-10-CM | POA: Diagnosis not present

## 2012-04-17 DIAGNOSIS — J1529 Pneumonia due to other staphylococcus: Secondary | ICD-10-CM | POA: Diagnosis not present

## 2012-04-17 DIAGNOSIS — N186 End stage renal disease: Secondary | ICD-10-CM | POA: Diagnosis not present

## 2012-04-17 DIAGNOSIS — D509 Iron deficiency anemia, unspecified: Secondary | ICD-10-CM | POA: Diagnosis not present

## 2012-04-18 DIAGNOSIS — N186 End stage renal disease: Secondary | ICD-10-CM | POA: Diagnosis not present

## 2012-04-19 DIAGNOSIS — D509 Iron deficiency anemia, unspecified: Secondary | ICD-10-CM | POA: Diagnosis not present

## 2012-04-19 DIAGNOSIS — D631 Anemia in chronic kidney disease: Secondary | ICD-10-CM | POA: Diagnosis not present

## 2012-04-19 DIAGNOSIS — N186 End stage renal disease: Secondary | ICD-10-CM | POA: Diagnosis not present

## 2012-04-19 DIAGNOSIS — J1529 Pneumonia due to other staphylococcus: Secondary | ICD-10-CM | POA: Diagnosis not present

## 2012-04-21 DIAGNOSIS — R0602 Shortness of breath: Secondary | ICD-10-CM | POA: Diagnosis not present

## 2012-04-21 DIAGNOSIS — J90 Pleural effusion, not elsewhere classified: Secondary | ICD-10-CM | POA: Diagnosis not present

## 2012-04-21 DIAGNOSIS — N039 Chronic nephritic syndrome with unspecified morphologic changes: Secondary | ICD-10-CM | POA: Diagnosis not present

## 2012-04-21 DIAGNOSIS — D509 Iron deficiency anemia, unspecified: Secondary | ICD-10-CM | POA: Diagnosis not present

## 2012-04-21 DIAGNOSIS — N186 End stage renal disease: Secondary | ICD-10-CM | POA: Diagnosis not present

## 2012-04-21 DIAGNOSIS — R918 Other nonspecific abnormal finding of lung field: Secondary | ICD-10-CM | POA: Diagnosis not present

## 2012-04-21 DIAGNOSIS — J1529 Pneumonia due to other staphylococcus: Secondary | ICD-10-CM | POA: Diagnosis not present

## 2012-04-21 DIAGNOSIS — I517 Cardiomegaly: Secondary | ICD-10-CM | POA: Diagnosis not present

## 2012-04-22 DIAGNOSIS — D638 Anemia in other chronic diseases classified elsewhere: Secondary | ICD-10-CM | POA: Diagnosis present

## 2012-04-22 DIAGNOSIS — Z992 Dependence on renal dialysis: Secondary | ICD-10-CM | POA: Diagnosis not present

## 2012-04-22 DIAGNOSIS — Z79899 Other long term (current) drug therapy: Secondary | ICD-10-CM | POA: Diagnosis not present

## 2012-04-22 DIAGNOSIS — J90 Pleural effusion, not elsewhere classified: Secondary | ICD-10-CM | POA: Diagnosis not present

## 2012-04-22 DIAGNOSIS — N186 End stage renal disease: Secondary | ICD-10-CM | POA: Diagnosis present

## 2012-04-22 DIAGNOSIS — I10 Essential (primary) hypertension: Secondary | ICD-10-CM | POA: Diagnosis not present

## 2012-04-22 DIAGNOSIS — N2581 Secondary hyperparathyroidism of renal origin: Secondary | ICD-10-CM | POA: Diagnosis not present

## 2012-04-22 DIAGNOSIS — Z7901 Long term (current) use of anticoagulants: Secondary | ICD-10-CM | POA: Diagnosis not present

## 2012-04-22 DIAGNOSIS — F17200 Nicotine dependence, unspecified, uncomplicated: Secondary | ICD-10-CM | POA: Diagnosis present

## 2012-04-22 DIAGNOSIS — Z7982 Long term (current) use of aspirin: Secondary | ICD-10-CM | POA: Diagnosis not present

## 2012-04-22 DIAGNOSIS — K449 Diaphragmatic hernia without obstruction or gangrene: Secondary | ICD-10-CM | POA: Diagnosis present

## 2012-04-22 DIAGNOSIS — I519 Heart disease, unspecified: Secondary | ICD-10-CM | POA: Diagnosis present

## 2012-04-22 DIAGNOSIS — Z885 Allergy status to narcotic agent status: Secondary | ICD-10-CM | POA: Diagnosis not present

## 2012-04-22 DIAGNOSIS — I120 Hypertensive chronic kidney disease with stage 5 chronic kidney disease or end stage renal disease: Secondary | ICD-10-CM | POA: Diagnosis not present

## 2012-04-22 DIAGNOSIS — J189 Pneumonia, unspecified organism: Secondary | ICD-10-CM | POA: Diagnosis not present

## 2012-04-22 DIAGNOSIS — R918 Other nonspecific abnormal finding of lung field: Secondary | ICD-10-CM | POA: Diagnosis not present

## 2012-04-22 DIAGNOSIS — I517 Cardiomegaly: Secondary | ICD-10-CM | POA: Diagnosis not present

## 2012-04-22 DIAGNOSIS — K219 Gastro-esophageal reflux disease without esophagitis: Secondary | ICD-10-CM | POA: Diagnosis present

## 2012-04-22 DIAGNOSIS — F329 Major depressive disorder, single episode, unspecified: Secondary | ICD-10-CM | POA: Diagnosis present

## 2012-04-22 DIAGNOSIS — M545 Low back pain: Secondary | ICD-10-CM | POA: Diagnosis present

## 2012-04-22 DIAGNOSIS — G8929 Other chronic pain: Secondary | ICD-10-CM | POA: Diagnosis present

## 2012-04-22 DIAGNOSIS — E119 Type 2 diabetes mellitus without complications: Secondary | ICD-10-CM | POA: Diagnosis present

## 2012-04-22 DIAGNOSIS — D631 Anemia in chronic kidney disease: Secondary | ICD-10-CM | POA: Diagnosis not present

## 2012-04-22 DIAGNOSIS — Z9119 Patient's noncompliance with other medical treatment and regimen: Secondary | ICD-10-CM | POA: Diagnosis not present

## 2012-04-22 DIAGNOSIS — Z954 Presence of other heart-valve replacement: Secondary | ICD-10-CM | POA: Diagnosis not present

## 2012-04-22 DIAGNOSIS — E2749 Other adrenocortical insufficiency: Secondary | ICD-10-CM | POA: Diagnosis not present

## 2012-04-22 DIAGNOSIS — J449 Chronic obstructive pulmonary disease, unspecified: Secondary | ICD-10-CM | POA: Diagnosis present

## 2012-04-22 DIAGNOSIS — E875 Hyperkalemia: Secondary | ICD-10-CM | POA: Diagnosis present

## 2012-04-26 ENCOUNTER — Encounter: Payer: Self-pay | Admitting: Cardiology

## 2012-04-28 DIAGNOSIS — D509 Iron deficiency anemia, unspecified: Secondary | ICD-10-CM | POA: Diagnosis not present

## 2012-04-28 DIAGNOSIS — J1529 Pneumonia due to other staphylococcus: Secondary | ICD-10-CM | POA: Diagnosis not present

## 2012-04-28 DIAGNOSIS — N039 Chronic nephritic syndrome with unspecified morphologic changes: Secondary | ICD-10-CM | POA: Diagnosis not present

## 2012-04-28 DIAGNOSIS — N186 End stage renal disease: Secondary | ICD-10-CM | POA: Diagnosis not present

## 2012-04-30 DIAGNOSIS — D631 Anemia in chronic kidney disease: Secondary | ICD-10-CM | POA: Diagnosis not present

## 2012-04-30 DIAGNOSIS — J1529 Pneumonia due to other staphylococcus: Secondary | ICD-10-CM | POA: Diagnosis not present

## 2012-04-30 DIAGNOSIS — N186 End stage renal disease: Secondary | ICD-10-CM | POA: Diagnosis not present

## 2012-04-30 DIAGNOSIS — D509 Iron deficiency anemia, unspecified: Secondary | ICD-10-CM | POA: Diagnosis not present

## 2012-05-03 DIAGNOSIS — N186 End stage renal disease: Secondary | ICD-10-CM | POA: Diagnosis not present

## 2012-05-03 DIAGNOSIS — D509 Iron deficiency anemia, unspecified: Secondary | ICD-10-CM | POA: Diagnosis not present

## 2012-05-03 DIAGNOSIS — N039 Chronic nephritic syndrome with unspecified morphologic changes: Secondary | ICD-10-CM | POA: Diagnosis not present

## 2012-05-03 DIAGNOSIS — J1529 Pneumonia due to other staphylococcus: Secondary | ICD-10-CM | POA: Diagnosis not present

## 2012-05-04 DIAGNOSIS — J159 Unspecified bacterial pneumonia: Secondary | ICD-10-CM | POA: Diagnosis not present

## 2012-05-05 DIAGNOSIS — J1529 Pneumonia due to other staphylococcus: Secondary | ICD-10-CM | POA: Diagnosis not present

## 2012-05-05 DIAGNOSIS — N186 End stage renal disease: Secondary | ICD-10-CM | POA: Diagnosis not present

## 2012-05-05 DIAGNOSIS — D631 Anemia in chronic kidney disease: Secondary | ICD-10-CM | POA: Diagnosis not present

## 2012-05-05 DIAGNOSIS — D509 Iron deficiency anemia, unspecified: Secondary | ICD-10-CM | POA: Diagnosis not present

## 2012-05-07 DIAGNOSIS — N039 Chronic nephritic syndrome with unspecified morphologic changes: Secondary | ICD-10-CM | POA: Diagnosis not present

## 2012-05-07 DIAGNOSIS — J1529 Pneumonia due to other staphylococcus: Secondary | ICD-10-CM | POA: Diagnosis not present

## 2012-05-07 DIAGNOSIS — D509 Iron deficiency anemia, unspecified: Secondary | ICD-10-CM | POA: Diagnosis not present

## 2012-05-07 DIAGNOSIS — N186 End stage renal disease: Secondary | ICD-10-CM | POA: Diagnosis not present

## 2012-05-10 DIAGNOSIS — D509 Iron deficiency anemia, unspecified: Secondary | ICD-10-CM | POA: Diagnosis not present

## 2012-05-10 DIAGNOSIS — N039 Chronic nephritic syndrome with unspecified morphologic changes: Secondary | ICD-10-CM | POA: Diagnosis not present

## 2012-05-10 DIAGNOSIS — J1529 Pneumonia due to other staphylococcus: Secondary | ICD-10-CM | POA: Diagnosis not present

## 2012-05-10 DIAGNOSIS — N186 End stage renal disease: Secondary | ICD-10-CM | POA: Diagnosis not present

## 2012-05-12 DIAGNOSIS — J1529 Pneumonia due to other staphylococcus: Secondary | ICD-10-CM | POA: Diagnosis not present

## 2012-05-12 DIAGNOSIS — D631 Anemia in chronic kidney disease: Secondary | ICD-10-CM | POA: Diagnosis not present

## 2012-05-12 DIAGNOSIS — N186 End stage renal disease: Secondary | ICD-10-CM | POA: Diagnosis not present

## 2012-05-12 DIAGNOSIS — D509 Iron deficiency anemia, unspecified: Secondary | ICD-10-CM | POA: Diagnosis not present

## 2012-05-14 DIAGNOSIS — N186 End stage renal disease: Secondary | ICD-10-CM | POA: Diagnosis not present

## 2012-05-14 DIAGNOSIS — D631 Anemia in chronic kidney disease: Secondary | ICD-10-CM | POA: Diagnosis not present

## 2012-05-14 DIAGNOSIS — D509 Iron deficiency anemia, unspecified: Secondary | ICD-10-CM | POA: Diagnosis not present

## 2012-05-14 DIAGNOSIS — J1529 Pneumonia due to other staphylococcus: Secondary | ICD-10-CM | POA: Diagnosis not present

## 2012-05-17 DIAGNOSIS — D509 Iron deficiency anemia, unspecified: Secondary | ICD-10-CM | POA: Diagnosis not present

## 2012-05-17 DIAGNOSIS — N186 End stage renal disease: Secondary | ICD-10-CM | POA: Diagnosis not present

## 2012-05-17 DIAGNOSIS — D631 Anemia in chronic kidney disease: Secondary | ICD-10-CM | POA: Diagnosis not present

## 2012-05-17 DIAGNOSIS — J1529 Pneumonia due to other staphylococcus: Secondary | ICD-10-CM | POA: Diagnosis not present

## 2012-05-19 DIAGNOSIS — N186 End stage renal disease: Secondary | ICD-10-CM | POA: Diagnosis not present

## 2012-05-19 DIAGNOSIS — D631 Anemia in chronic kidney disease: Secondary | ICD-10-CM | POA: Diagnosis not present

## 2012-05-19 DIAGNOSIS — D509 Iron deficiency anemia, unspecified: Secondary | ICD-10-CM | POA: Diagnosis not present

## 2012-05-19 DIAGNOSIS — J1529 Pneumonia due to other staphylococcus: Secondary | ICD-10-CM | POA: Diagnosis not present

## 2012-05-21 DIAGNOSIS — D631 Anemia in chronic kidney disease: Secondary | ICD-10-CM | POA: Diagnosis not present

## 2012-05-21 DIAGNOSIS — N186 End stage renal disease: Secondary | ICD-10-CM | POA: Diagnosis not present

## 2012-05-21 DIAGNOSIS — D509 Iron deficiency anemia, unspecified: Secondary | ICD-10-CM | POA: Diagnosis not present

## 2012-05-21 DIAGNOSIS — N2581 Secondary hyperparathyroidism of renal origin: Secondary | ICD-10-CM | POA: Diagnosis not present

## 2012-05-24 DIAGNOSIS — N2581 Secondary hyperparathyroidism of renal origin: Secondary | ICD-10-CM | POA: Diagnosis not present

## 2012-05-24 DIAGNOSIS — D631 Anemia in chronic kidney disease: Secondary | ICD-10-CM | POA: Diagnosis not present

## 2012-05-24 DIAGNOSIS — D509 Iron deficiency anemia, unspecified: Secondary | ICD-10-CM | POA: Diagnosis not present

## 2012-05-24 DIAGNOSIS — N186 End stage renal disease: Secondary | ICD-10-CM | POA: Diagnosis not present

## 2012-05-26 DIAGNOSIS — N2581 Secondary hyperparathyroidism of renal origin: Secondary | ICD-10-CM | POA: Diagnosis not present

## 2012-05-26 DIAGNOSIS — N186 End stage renal disease: Secondary | ICD-10-CM | POA: Diagnosis not present

## 2012-05-26 DIAGNOSIS — D509 Iron deficiency anemia, unspecified: Secondary | ICD-10-CM | POA: Diagnosis not present

## 2012-05-26 DIAGNOSIS — N039 Chronic nephritic syndrome with unspecified morphologic changes: Secondary | ICD-10-CM | POA: Diagnosis not present

## 2012-05-28 DIAGNOSIS — D509 Iron deficiency anemia, unspecified: Secondary | ICD-10-CM | POA: Diagnosis not present

## 2012-05-28 DIAGNOSIS — N039 Chronic nephritic syndrome with unspecified morphologic changes: Secondary | ICD-10-CM | POA: Diagnosis not present

## 2012-05-28 DIAGNOSIS — N2581 Secondary hyperparathyroidism of renal origin: Secondary | ICD-10-CM | POA: Diagnosis not present

## 2012-05-28 DIAGNOSIS — N186 End stage renal disease: Secondary | ICD-10-CM | POA: Diagnosis not present

## 2012-05-31 DIAGNOSIS — D509 Iron deficiency anemia, unspecified: Secondary | ICD-10-CM | POA: Diagnosis not present

## 2012-05-31 DIAGNOSIS — N039 Chronic nephritic syndrome with unspecified morphologic changes: Secondary | ICD-10-CM | POA: Diagnosis not present

## 2012-05-31 DIAGNOSIS — N2581 Secondary hyperparathyroidism of renal origin: Secondary | ICD-10-CM | POA: Diagnosis not present

## 2012-05-31 DIAGNOSIS — N186 End stage renal disease: Secondary | ICD-10-CM | POA: Diagnosis not present

## 2012-06-02 DIAGNOSIS — N2581 Secondary hyperparathyroidism of renal origin: Secondary | ICD-10-CM | POA: Diagnosis not present

## 2012-06-02 DIAGNOSIS — D509 Iron deficiency anemia, unspecified: Secondary | ICD-10-CM | POA: Diagnosis not present

## 2012-06-02 DIAGNOSIS — D631 Anemia in chronic kidney disease: Secondary | ICD-10-CM | POA: Diagnosis not present

## 2012-06-02 DIAGNOSIS — N186 End stage renal disease: Secondary | ICD-10-CM | POA: Diagnosis not present

## 2012-06-04 DIAGNOSIS — N039 Chronic nephritic syndrome with unspecified morphologic changes: Secondary | ICD-10-CM | POA: Diagnosis not present

## 2012-06-04 DIAGNOSIS — D509 Iron deficiency anemia, unspecified: Secondary | ICD-10-CM | POA: Diagnosis not present

## 2012-06-04 DIAGNOSIS — N186 End stage renal disease: Secondary | ICD-10-CM | POA: Diagnosis not present

## 2012-06-04 DIAGNOSIS — N2581 Secondary hyperparathyroidism of renal origin: Secondary | ICD-10-CM | POA: Diagnosis not present

## 2012-06-07 DIAGNOSIS — N2581 Secondary hyperparathyroidism of renal origin: Secondary | ICD-10-CM | POA: Diagnosis not present

## 2012-06-07 DIAGNOSIS — N039 Chronic nephritic syndrome with unspecified morphologic changes: Secondary | ICD-10-CM | POA: Diagnosis not present

## 2012-06-07 DIAGNOSIS — N186 End stage renal disease: Secondary | ICD-10-CM | POA: Diagnosis not present

## 2012-06-07 DIAGNOSIS — D509 Iron deficiency anemia, unspecified: Secondary | ICD-10-CM | POA: Diagnosis not present

## 2012-06-09 DIAGNOSIS — D509 Iron deficiency anemia, unspecified: Secondary | ICD-10-CM | POA: Diagnosis not present

## 2012-06-09 DIAGNOSIS — N039 Chronic nephritic syndrome with unspecified morphologic changes: Secondary | ICD-10-CM | POA: Diagnosis not present

## 2012-06-09 DIAGNOSIS — N2581 Secondary hyperparathyroidism of renal origin: Secondary | ICD-10-CM | POA: Diagnosis not present

## 2012-06-09 DIAGNOSIS — N186 End stage renal disease: Secondary | ICD-10-CM | POA: Diagnosis not present

## 2012-06-11 DIAGNOSIS — D509 Iron deficiency anemia, unspecified: Secondary | ICD-10-CM | POA: Diagnosis not present

## 2012-06-11 DIAGNOSIS — N186 End stage renal disease: Secondary | ICD-10-CM | POA: Diagnosis not present

## 2012-06-11 DIAGNOSIS — N039 Chronic nephritic syndrome with unspecified morphologic changes: Secondary | ICD-10-CM | POA: Diagnosis not present

## 2012-06-11 DIAGNOSIS — N2581 Secondary hyperparathyroidism of renal origin: Secondary | ICD-10-CM | POA: Diagnosis not present

## 2012-06-12 DIAGNOSIS — N2581 Secondary hyperparathyroidism of renal origin: Secondary | ICD-10-CM | POA: Diagnosis not present

## 2012-06-12 DIAGNOSIS — N039 Chronic nephritic syndrome with unspecified morphologic changes: Secondary | ICD-10-CM | POA: Diagnosis not present

## 2012-06-12 DIAGNOSIS — D509 Iron deficiency anemia, unspecified: Secondary | ICD-10-CM | POA: Diagnosis not present

## 2012-06-12 DIAGNOSIS — N186 End stage renal disease: Secondary | ICD-10-CM | POA: Diagnosis not present

## 2012-06-14 DIAGNOSIS — N186 End stage renal disease: Secondary | ICD-10-CM | POA: Diagnosis not present

## 2012-06-14 DIAGNOSIS — N2581 Secondary hyperparathyroidism of renal origin: Secondary | ICD-10-CM | POA: Diagnosis not present

## 2012-06-14 DIAGNOSIS — N039 Chronic nephritic syndrome with unspecified morphologic changes: Secondary | ICD-10-CM | POA: Diagnosis not present

## 2012-06-14 DIAGNOSIS — D509 Iron deficiency anemia, unspecified: Secondary | ICD-10-CM | POA: Diagnosis not present

## 2012-06-16 DIAGNOSIS — D509 Iron deficiency anemia, unspecified: Secondary | ICD-10-CM | POA: Diagnosis not present

## 2012-06-16 DIAGNOSIS — N2581 Secondary hyperparathyroidism of renal origin: Secondary | ICD-10-CM | POA: Diagnosis not present

## 2012-06-16 DIAGNOSIS — N186 End stage renal disease: Secondary | ICD-10-CM | POA: Diagnosis not present

## 2012-06-16 DIAGNOSIS — D631 Anemia in chronic kidney disease: Secondary | ICD-10-CM | POA: Diagnosis not present

## 2012-06-18 DIAGNOSIS — N186 End stage renal disease: Secondary | ICD-10-CM | POA: Diagnosis not present

## 2012-06-18 DIAGNOSIS — N039 Chronic nephritic syndrome with unspecified morphologic changes: Secondary | ICD-10-CM | POA: Diagnosis not present

## 2012-06-18 DIAGNOSIS — N2581 Secondary hyperparathyroidism of renal origin: Secondary | ICD-10-CM | POA: Diagnosis not present

## 2012-06-18 DIAGNOSIS — D509 Iron deficiency anemia, unspecified: Secondary | ICD-10-CM | POA: Diagnosis not present

## 2012-06-19 DIAGNOSIS — N186 End stage renal disease: Secondary | ICD-10-CM | POA: Diagnosis not present

## 2012-06-21 DIAGNOSIS — N039 Chronic nephritic syndrome with unspecified morphologic changes: Secondary | ICD-10-CM | POA: Diagnosis not present

## 2012-06-21 DIAGNOSIS — D631 Anemia in chronic kidney disease: Secondary | ICD-10-CM | POA: Diagnosis not present

## 2012-06-21 DIAGNOSIS — D509 Iron deficiency anemia, unspecified: Secondary | ICD-10-CM | POA: Diagnosis not present

## 2012-06-21 DIAGNOSIS — N186 End stage renal disease: Secondary | ICD-10-CM | POA: Diagnosis not present

## 2012-06-21 DIAGNOSIS — Z23 Encounter for immunization: Secondary | ICD-10-CM | POA: Diagnosis not present

## 2012-06-23 DIAGNOSIS — N186 End stage renal disease: Secondary | ICD-10-CM | POA: Diagnosis not present

## 2012-06-23 DIAGNOSIS — D631 Anemia in chronic kidney disease: Secondary | ICD-10-CM | POA: Diagnosis not present

## 2012-06-23 DIAGNOSIS — Z23 Encounter for immunization: Secondary | ICD-10-CM | POA: Diagnosis not present

## 2012-06-23 DIAGNOSIS — D509 Iron deficiency anemia, unspecified: Secondary | ICD-10-CM | POA: Diagnosis not present

## 2012-06-25 DIAGNOSIS — N186 End stage renal disease: Secondary | ICD-10-CM | POA: Diagnosis not present

## 2012-06-25 DIAGNOSIS — Z23 Encounter for immunization: Secondary | ICD-10-CM | POA: Diagnosis not present

## 2012-06-25 DIAGNOSIS — N039 Chronic nephritic syndrome with unspecified morphologic changes: Secondary | ICD-10-CM | POA: Diagnosis not present

## 2012-06-25 DIAGNOSIS — D509 Iron deficiency anemia, unspecified: Secondary | ICD-10-CM | POA: Diagnosis not present

## 2012-06-28 DIAGNOSIS — N039 Chronic nephritic syndrome with unspecified morphologic changes: Secondary | ICD-10-CM | POA: Diagnosis not present

## 2012-06-28 DIAGNOSIS — N186 End stage renal disease: Secondary | ICD-10-CM | POA: Diagnosis not present

## 2012-06-28 DIAGNOSIS — D509 Iron deficiency anemia, unspecified: Secondary | ICD-10-CM | POA: Diagnosis not present

## 2012-06-28 DIAGNOSIS — Z23 Encounter for immunization: Secondary | ICD-10-CM | POA: Diagnosis not present

## 2012-06-30 DIAGNOSIS — N186 End stage renal disease: Secondary | ICD-10-CM | POA: Diagnosis not present

## 2012-06-30 DIAGNOSIS — D631 Anemia in chronic kidney disease: Secondary | ICD-10-CM | POA: Diagnosis not present

## 2012-06-30 DIAGNOSIS — Z23 Encounter for immunization: Secondary | ICD-10-CM | POA: Diagnosis not present

## 2012-06-30 DIAGNOSIS — D509 Iron deficiency anemia, unspecified: Secondary | ICD-10-CM | POA: Diagnosis not present

## 2012-07-02 DIAGNOSIS — N186 End stage renal disease: Secondary | ICD-10-CM | POA: Diagnosis not present

## 2012-07-02 DIAGNOSIS — D509 Iron deficiency anemia, unspecified: Secondary | ICD-10-CM | POA: Diagnosis not present

## 2012-07-02 DIAGNOSIS — D631 Anemia in chronic kidney disease: Secondary | ICD-10-CM | POA: Diagnosis not present

## 2012-07-02 DIAGNOSIS — Z23 Encounter for immunization: Secondary | ICD-10-CM | POA: Diagnosis not present

## 2012-07-05 DIAGNOSIS — D509 Iron deficiency anemia, unspecified: Secondary | ICD-10-CM | POA: Diagnosis not present

## 2012-07-05 DIAGNOSIS — N186 End stage renal disease: Secondary | ICD-10-CM | POA: Diagnosis not present

## 2012-07-05 DIAGNOSIS — D631 Anemia in chronic kidney disease: Secondary | ICD-10-CM | POA: Diagnosis not present

## 2012-07-05 DIAGNOSIS — Z23 Encounter for immunization: Secondary | ICD-10-CM | POA: Diagnosis not present

## 2012-07-07 DIAGNOSIS — Z23 Encounter for immunization: Secondary | ICD-10-CM | POA: Diagnosis not present

## 2012-07-07 DIAGNOSIS — N186 End stage renal disease: Secondary | ICD-10-CM | POA: Diagnosis not present

## 2012-07-07 DIAGNOSIS — D631 Anemia in chronic kidney disease: Secondary | ICD-10-CM | POA: Diagnosis not present

## 2012-07-07 DIAGNOSIS — D509 Iron deficiency anemia, unspecified: Secondary | ICD-10-CM | POA: Diagnosis not present

## 2012-07-08 DIAGNOSIS — I10 Essential (primary) hypertension: Secondary | ICD-10-CM | POA: Diagnosis not present

## 2012-07-09 DIAGNOSIS — Z23 Encounter for immunization: Secondary | ICD-10-CM | POA: Diagnosis not present

## 2012-07-09 DIAGNOSIS — D509 Iron deficiency anemia, unspecified: Secondary | ICD-10-CM | POA: Diagnosis not present

## 2012-07-09 DIAGNOSIS — N186 End stage renal disease: Secondary | ICD-10-CM | POA: Diagnosis not present

## 2012-07-09 DIAGNOSIS — N039 Chronic nephritic syndrome with unspecified morphologic changes: Secondary | ICD-10-CM | POA: Diagnosis not present

## 2012-07-12 DIAGNOSIS — D509 Iron deficiency anemia, unspecified: Secondary | ICD-10-CM | POA: Diagnosis not present

## 2012-07-12 DIAGNOSIS — D631 Anemia in chronic kidney disease: Secondary | ICD-10-CM | POA: Diagnosis not present

## 2012-07-12 DIAGNOSIS — Z23 Encounter for immunization: Secondary | ICD-10-CM | POA: Diagnosis not present

## 2012-07-12 DIAGNOSIS — N186 End stage renal disease: Secondary | ICD-10-CM | POA: Diagnosis not present

## 2012-07-14 DIAGNOSIS — Z23 Encounter for immunization: Secondary | ICD-10-CM | POA: Diagnosis not present

## 2012-07-14 DIAGNOSIS — D509 Iron deficiency anemia, unspecified: Secondary | ICD-10-CM | POA: Diagnosis not present

## 2012-07-14 DIAGNOSIS — N186 End stage renal disease: Secondary | ICD-10-CM | POA: Diagnosis not present

## 2012-07-14 DIAGNOSIS — D631 Anemia in chronic kidney disease: Secondary | ICD-10-CM | POA: Diagnosis not present

## 2012-07-16 DIAGNOSIS — D509 Iron deficiency anemia, unspecified: Secondary | ICD-10-CM | POA: Diagnosis not present

## 2012-07-16 DIAGNOSIS — D631 Anemia in chronic kidney disease: Secondary | ICD-10-CM | POA: Diagnosis not present

## 2012-07-16 DIAGNOSIS — N186 End stage renal disease: Secondary | ICD-10-CM | POA: Diagnosis not present

## 2012-07-16 DIAGNOSIS — Z23 Encounter for immunization: Secondary | ICD-10-CM | POA: Diagnosis not present

## 2012-07-19 DIAGNOSIS — Z23 Encounter for immunization: Secondary | ICD-10-CM | POA: Diagnosis not present

## 2012-07-19 DIAGNOSIS — D631 Anemia in chronic kidney disease: Secondary | ICD-10-CM | POA: Diagnosis not present

## 2012-07-19 DIAGNOSIS — N186 End stage renal disease: Secondary | ICD-10-CM | POA: Diagnosis not present

## 2012-07-19 DIAGNOSIS — D509 Iron deficiency anemia, unspecified: Secondary | ICD-10-CM | POA: Diagnosis not present

## 2012-07-21 DIAGNOSIS — N186 End stage renal disease: Secondary | ICD-10-CM | POA: Diagnosis not present

## 2012-07-21 DIAGNOSIS — D509 Iron deficiency anemia, unspecified: Secondary | ICD-10-CM | POA: Diagnosis not present

## 2012-07-21 DIAGNOSIS — N039 Chronic nephritic syndrome with unspecified morphologic changes: Secondary | ICD-10-CM | POA: Diagnosis not present

## 2012-07-23 DIAGNOSIS — N039 Chronic nephritic syndrome with unspecified morphologic changes: Secondary | ICD-10-CM | POA: Diagnosis not present

## 2012-07-23 DIAGNOSIS — N186 End stage renal disease: Secondary | ICD-10-CM | POA: Diagnosis not present

## 2012-07-23 DIAGNOSIS — D509 Iron deficiency anemia, unspecified: Secondary | ICD-10-CM | POA: Diagnosis not present

## 2012-07-26 DIAGNOSIS — N186 End stage renal disease: Secondary | ICD-10-CM | POA: Diagnosis not present

## 2012-07-26 DIAGNOSIS — N039 Chronic nephritic syndrome with unspecified morphologic changes: Secondary | ICD-10-CM | POA: Diagnosis not present

## 2012-07-26 DIAGNOSIS — D509 Iron deficiency anemia, unspecified: Secondary | ICD-10-CM | POA: Diagnosis not present

## 2012-07-28 DIAGNOSIS — N186 End stage renal disease: Secondary | ICD-10-CM | POA: Diagnosis not present

## 2012-07-28 DIAGNOSIS — D509 Iron deficiency anemia, unspecified: Secondary | ICD-10-CM | POA: Diagnosis not present

## 2012-07-28 DIAGNOSIS — N039 Chronic nephritic syndrome with unspecified morphologic changes: Secondary | ICD-10-CM | POA: Diagnosis not present

## 2012-07-30 DIAGNOSIS — D509 Iron deficiency anemia, unspecified: Secondary | ICD-10-CM | POA: Diagnosis not present

## 2012-07-30 DIAGNOSIS — N039 Chronic nephritic syndrome with unspecified morphologic changes: Secondary | ICD-10-CM | POA: Diagnosis not present

## 2012-07-30 DIAGNOSIS — N186 End stage renal disease: Secondary | ICD-10-CM | POA: Diagnosis not present

## 2012-08-02 DIAGNOSIS — N186 End stage renal disease: Secondary | ICD-10-CM | POA: Diagnosis not present

## 2012-08-02 DIAGNOSIS — D509 Iron deficiency anemia, unspecified: Secondary | ICD-10-CM | POA: Diagnosis not present

## 2012-08-02 DIAGNOSIS — N039 Chronic nephritic syndrome with unspecified morphologic changes: Secondary | ICD-10-CM | POA: Diagnosis not present

## 2012-08-04 DIAGNOSIS — D631 Anemia in chronic kidney disease: Secondary | ICD-10-CM | POA: Diagnosis not present

## 2012-08-04 DIAGNOSIS — D509 Iron deficiency anemia, unspecified: Secondary | ICD-10-CM | POA: Diagnosis not present

## 2012-08-04 DIAGNOSIS — N186 End stage renal disease: Secondary | ICD-10-CM | POA: Diagnosis not present

## 2012-08-06 DIAGNOSIS — N039 Chronic nephritic syndrome with unspecified morphologic changes: Secondary | ICD-10-CM | POA: Diagnosis not present

## 2012-08-06 DIAGNOSIS — D509 Iron deficiency anemia, unspecified: Secondary | ICD-10-CM | POA: Diagnosis not present

## 2012-08-06 DIAGNOSIS — N186 End stage renal disease: Secondary | ICD-10-CM | POA: Diagnosis not present

## 2012-08-09 DIAGNOSIS — D509 Iron deficiency anemia, unspecified: Secondary | ICD-10-CM | POA: Diagnosis not present

## 2012-08-09 DIAGNOSIS — N186 End stage renal disease: Secondary | ICD-10-CM | POA: Diagnosis not present

## 2012-08-09 DIAGNOSIS — D631 Anemia in chronic kidney disease: Secondary | ICD-10-CM | POA: Diagnosis not present

## 2012-08-11 DIAGNOSIS — N039 Chronic nephritic syndrome with unspecified morphologic changes: Secondary | ICD-10-CM | POA: Diagnosis not present

## 2012-08-11 DIAGNOSIS — D509 Iron deficiency anemia, unspecified: Secondary | ICD-10-CM | POA: Diagnosis not present

## 2012-08-11 DIAGNOSIS — N186 End stage renal disease: Secondary | ICD-10-CM | POA: Diagnosis not present

## 2012-08-13 DIAGNOSIS — D631 Anemia in chronic kidney disease: Secondary | ICD-10-CM | POA: Diagnosis not present

## 2012-08-13 DIAGNOSIS — N186 End stage renal disease: Secondary | ICD-10-CM | POA: Diagnosis not present

## 2012-08-13 DIAGNOSIS — D509 Iron deficiency anemia, unspecified: Secondary | ICD-10-CM | POA: Diagnosis not present

## 2012-08-16 DIAGNOSIS — N039 Chronic nephritic syndrome with unspecified morphologic changes: Secondary | ICD-10-CM | POA: Diagnosis not present

## 2012-08-16 DIAGNOSIS — N186 End stage renal disease: Secondary | ICD-10-CM | POA: Diagnosis not present

## 2012-08-16 DIAGNOSIS — D509 Iron deficiency anemia, unspecified: Secondary | ICD-10-CM | POA: Diagnosis not present

## 2012-08-18 DIAGNOSIS — N186 End stage renal disease: Secondary | ICD-10-CM | POA: Diagnosis not present

## 2012-08-18 DIAGNOSIS — D631 Anemia in chronic kidney disease: Secondary | ICD-10-CM | POA: Diagnosis not present

## 2012-08-18 DIAGNOSIS — D509 Iron deficiency anemia, unspecified: Secondary | ICD-10-CM | POA: Diagnosis not present

## 2012-08-19 DIAGNOSIS — N186 End stage renal disease: Secondary | ICD-10-CM | POA: Diagnosis not present

## 2012-08-20 DIAGNOSIS — D631 Anemia in chronic kidney disease: Secondary | ICD-10-CM | POA: Diagnosis not present

## 2012-08-20 DIAGNOSIS — D509 Iron deficiency anemia, unspecified: Secondary | ICD-10-CM | POA: Diagnosis not present

## 2012-08-20 DIAGNOSIS — N039 Chronic nephritic syndrome with unspecified morphologic changes: Secondary | ICD-10-CM | POA: Diagnosis not present

## 2012-08-20 DIAGNOSIS — N186 End stage renal disease: Secondary | ICD-10-CM | POA: Diagnosis not present

## 2012-08-20 DIAGNOSIS — Z23 Encounter for immunization: Secondary | ICD-10-CM | POA: Diagnosis not present

## 2012-08-23 DIAGNOSIS — N039 Chronic nephritic syndrome with unspecified morphologic changes: Secondary | ICD-10-CM | POA: Diagnosis not present

## 2012-08-23 DIAGNOSIS — N186 End stage renal disease: Secondary | ICD-10-CM | POA: Diagnosis not present

## 2012-08-23 DIAGNOSIS — Z23 Encounter for immunization: Secondary | ICD-10-CM | POA: Diagnosis not present

## 2012-08-23 DIAGNOSIS — D509 Iron deficiency anemia, unspecified: Secondary | ICD-10-CM | POA: Diagnosis not present

## 2012-08-25 DIAGNOSIS — N039 Chronic nephritic syndrome with unspecified morphologic changes: Secondary | ICD-10-CM | POA: Diagnosis not present

## 2012-08-25 DIAGNOSIS — D509 Iron deficiency anemia, unspecified: Secondary | ICD-10-CM | POA: Diagnosis not present

## 2012-08-25 DIAGNOSIS — N186 End stage renal disease: Secondary | ICD-10-CM | POA: Diagnosis not present

## 2012-08-25 DIAGNOSIS — Z23 Encounter for immunization: Secondary | ICD-10-CM | POA: Diagnosis not present

## 2012-08-27 DIAGNOSIS — N186 End stage renal disease: Secondary | ICD-10-CM | POA: Diagnosis not present

## 2012-08-27 DIAGNOSIS — D509 Iron deficiency anemia, unspecified: Secondary | ICD-10-CM | POA: Diagnosis not present

## 2012-08-27 DIAGNOSIS — Z23 Encounter for immunization: Secondary | ICD-10-CM | POA: Diagnosis not present

## 2012-08-27 DIAGNOSIS — N039 Chronic nephritic syndrome with unspecified morphologic changes: Secondary | ICD-10-CM | POA: Diagnosis not present

## 2012-08-30 DIAGNOSIS — D631 Anemia in chronic kidney disease: Secondary | ICD-10-CM | POA: Diagnosis not present

## 2012-08-30 DIAGNOSIS — Z23 Encounter for immunization: Secondary | ICD-10-CM | POA: Diagnosis not present

## 2012-08-30 DIAGNOSIS — D509 Iron deficiency anemia, unspecified: Secondary | ICD-10-CM | POA: Diagnosis not present

## 2012-08-30 DIAGNOSIS — N186 End stage renal disease: Secondary | ICD-10-CM | POA: Diagnosis not present

## 2012-09-01 DIAGNOSIS — D509 Iron deficiency anemia, unspecified: Secondary | ICD-10-CM | POA: Diagnosis not present

## 2012-09-01 DIAGNOSIS — N186 End stage renal disease: Secondary | ICD-10-CM | POA: Diagnosis not present

## 2012-09-01 DIAGNOSIS — Z23 Encounter for immunization: Secondary | ICD-10-CM | POA: Diagnosis not present

## 2012-09-01 DIAGNOSIS — N039 Chronic nephritic syndrome with unspecified morphologic changes: Secondary | ICD-10-CM | POA: Diagnosis not present

## 2012-09-03 DIAGNOSIS — D509 Iron deficiency anemia, unspecified: Secondary | ICD-10-CM | POA: Diagnosis not present

## 2012-09-03 DIAGNOSIS — D631 Anemia in chronic kidney disease: Secondary | ICD-10-CM | POA: Diagnosis not present

## 2012-09-03 DIAGNOSIS — N186 End stage renal disease: Secondary | ICD-10-CM | POA: Diagnosis not present

## 2012-09-03 DIAGNOSIS — Z23 Encounter for immunization: Secondary | ICD-10-CM | POA: Diagnosis not present

## 2012-09-06 DIAGNOSIS — D509 Iron deficiency anemia, unspecified: Secondary | ICD-10-CM | POA: Diagnosis not present

## 2012-09-06 DIAGNOSIS — N039 Chronic nephritic syndrome with unspecified morphologic changes: Secondary | ICD-10-CM | POA: Diagnosis not present

## 2012-09-06 DIAGNOSIS — N186 End stage renal disease: Secondary | ICD-10-CM | POA: Diagnosis not present

## 2012-09-06 DIAGNOSIS — Z23 Encounter for immunization: Secondary | ICD-10-CM | POA: Diagnosis not present

## 2012-09-08 DIAGNOSIS — D509 Iron deficiency anemia, unspecified: Secondary | ICD-10-CM | POA: Diagnosis not present

## 2012-09-08 DIAGNOSIS — N039 Chronic nephritic syndrome with unspecified morphologic changes: Secondary | ICD-10-CM | POA: Diagnosis not present

## 2012-09-08 DIAGNOSIS — N186 End stage renal disease: Secondary | ICD-10-CM | POA: Diagnosis not present

## 2012-09-08 DIAGNOSIS — Z23 Encounter for immunization: Secondary | ICD-10-CM | POA: Diagnosis not present

## 2012-09-10 DIAGNOSIS — Z23 Encounter for immunization: Secondary | ICD-10-CM | POA: Diagnosis not present

## 2012-09-10 DIAGNOSIS — D509 Iron deficiency anemia, unspecified: Secondary | ICD-10-CM | POA: Diagnosis not present

## 2012-09-10 DIAGNOSIS — D631 Anemia in chronic kidney disease: Secondary | ICD-10-CM | POA: Diagnosis not present

## 2012-09-10 DIAGNOSIS — N186 End stage renal disease: Secondary | ICD-10-CM | POA: Diagnosis not present

## 2012-09-11 DIAGNOSIS — Z23 Encounter for immunization: Secondary | ICD-10-CM | POA: Diagnosis not present

## 2012-09-11 DIAGNOSIS — D631 Anemia in chronic kidney disease: Secondary | ICD-10-CM | POA: Diagnosis not present

## 2012-09-11 DIAGNOSIS — N186 End stage renal disease: Secondary | ICD-10-CM | POA: Diagnosis not present

## 2012-09-11 DIAGNOSIS — D509 Iron deficiency anemia, unspecified: Secondary | ICD-10-CM | POA: Diagnosis not present

## 2012-09-13 DIAGNOSIS — N186 End stage renal disease: Secondary | ICD-10-CM | POA: Diagnosis not present

## 2012-09-13 DIAGNOSIS — Z23 Encounter for immunization: Secondary | ICD-10-CM | POA: Diagnosis not present

## 2012-09-13 DIAGNOSIS — D631 Anemia in chronic kidney disease: Secondary | ICD-10-CM | POA: Diagnosis not present

## 2012-09-13 DIAGNOSIS — D509 Iron deficiency anemia, unspecified: Secondary | ICD-10-CM | POA: Diagnosis not present

## 2012-09-15 DIAGNOSIS — D509 Iron deficiency anemia, unspecified: Secondary | ICD-10-CM | POA: Diagnosis not present

## 2012-09-15 DIAGNOSIS — N186 End stage renal disease: Secondary | ICD-10-CM | POA: Diagnosis not present

## 2012-09-15 DIAGNOSIS — Z23 Encounter for immunization: Secondary | ICD-10-CM | POA: Diagnosis not present

## 2012-09-15 DIAGNOSIS — D631 Anemia in chronic kidney disease: Secondary | ICD-10-CM | POA: Diagnosis not present

## 2012-09-17 DIAGNOSIS — N186 End stage renal disease: Secondary | ICD-10-CM | POA: Diagnosis not present

## 2012-09-17 DIAGNOSIS — Z23 Encounter for immunization: Secondary | ICD-10-CM | POA: Diagnosis not present

## 2012-09-17 DIAGNOSIS — N039 Chronic nephritic syndrome with unspecified morphologic changes: Secondary | ICD-10-CM | POA: Diagnosis not present

## 2012-09-17 DIAGNOSIS — D509 Iron deficiency anemia, unspecified: Secondary | ICD-10-CM | POA: Diagnosis not present

## 2012-09-18 DIAGNOSIS — N186 End stage renal disease: Secondary | ICD-10-CM | POA: Diagnosis not present

## 2012-09-20 DIAGNOSIS — D509 Iron deficiency anemia, unspecified: Secondary | ICD-10-CM | POA: Diagnosis not present

## 2012-09-20 DIAGNOSIS — D631 Anemia in chronic kidney disease: Secondary | ICD-10-CM | POA: Diagnosis not present

## 2012-09-20 DIAGNOSIS — N186 End stage renal disease: Secondary | ICD-10-CM | POA: Diagnosis not present

## 2012-09-22 DIAGNOSIS — D509 Iron deficiency anemia, unspecified: Secondary | ICD-10-CM | POA: Diagnosis not present

## 2012-09-22 DIAGNOSIS — N186 End stage renal disease: Secondary | ICD-10-CM | POA: Diagnosis not present

## 2012-09-22 DIAGNOSIS — N039 Chronic nephritic syndrome with unspecified morphologic changes: Secondary | ICD-10-CM | POA: Diagnosis not present

## 2012-09-23 DIAGNOSIS — G909 Disorder of the autonomic nervous system, unspecified: Secondary | ICD-10-CM | POA: Diagnosis not present

## 2012-09-24 DIAGNOSIS — D509 Iron deficiency anemia, unspecified: Secondary | ICD-10-CM | POA: Diagnosis not present

## 2012-09-24 DIAGNOSIS — N186 End stage renal disease: Secondary | ICD-10-CM | POA: Diagnosis not present

## 2012-09-24 DIAGNOSIS — D631 Anemia in chronic kidney disease: Secondary | ICD-10-CM | POA: Diagnosis not present

## 2012-09-27 DIAGNOSIS — N186 End stage renal disease: Secondary | ICD-10-CM | POA: Diagnosis not present

## 2012-09-27 DIAGNOSIS — D631 Anemia in chronic kidney disease: Secondary | ICD-10-CM | POA: Diagnosis not present

## 2012-09-27 DIAGNOSIS — D509 Iron deficiency anemia, unspecified: Secondary | ICD-10-CM | POA: Diagnosis not present

## 2012-09-29 DIAGNOSIS — D631 Anemia in chronic kidney disease: Secondary | ICD-10-CM | POA: Diagnosis not present

## 2012-09-29 DIAGNOSIS — D509 Iron deficiency anemia, unspecified: Secondary | ICD-10-CM | POA: Diagnosis not present

## 2012-09-29 DIAGNOSIS — N186 End stage renal disease: Secondary | ICD-10-CM | POA: Diagnosis not present

## 2012-10-01 DIAGNOSIS — D631 Anemia in chronic kidney disease: Secondary | ICD-10-CM | POA: Diagnosis not present

## 2012-10-01 DIAGNOSIS — N186 End stage renal disease: Secondary | ICD-10-CM | POA: Diagnosis not present

## 2012-10-01 DIAGNOSIS — D509 Iron deficiency anemia, unspecified: Secondary | ICD-10-CM | POA: Diagnosis not present

## 2012-10-04 DIAGNOSIS — N186 End stage renal disease: Secondary | ICD-10-CM | POA: Diagnosis not present

## 2012-10-04 DIAGNOSIS — N039 Chronic nephritic syndrome with unspecified morphologic changes: Secondary | ICD-10-CM | POA: Diagnosis not present

## 2012-10-04 DIAGNOSIS — D509 Iron deficiency anemia, unspecified: Secondary | ICD-10-CM | POA: Diagnosis not present

## 2012-10-06 DIAGNOSIS — N186 End stage renal disease: Secondary | ICD-10-CM | POA: Diagnosis not present

## 2012-10-06 DIAGNOSIS — N039 Chronic nephritic syndrome with unspecified morphologic changes: Secondary | ICD-10-CM | POA: Diagnosis not present

## 2012-10-06 DIAGNOSIS — D509 Iron deficiency anemia, unspecified: Secondary | ICD-10-CM | POA: Diagnosis not present

## 2012-10-08 DIAGNOSIS — N186 End stage renal disease: Secondary | ICD-10-CM | POA: Diagnosis not present

## 2012-10-08 DIAGNOSIS — D509 Iron deficiency anemia, unspecified: Secondary | ICD-10-CM | POA: Diagnosis not present

## 2012-10-08 DIAGNOSIS — N039 Chronic nephritic syndrome with unspecified morphologic changes: Secondary | ICD-10-CM | POA: Diagnosis not present

## 2012-10-09 DIAGNOSIS — N186 End stage renal disease: Secondary | ICD-10-CM | POA: Diagnosis not present

## 2012-10-09 DIAGNOSIS — D509 Iron deficiency anemia, unspecified: Secondary | ICD-10-CM | POA: Diagnosis not present

## 2012-10-09 DIAGNOSIS — N039 Chronic nephritic syndrome with unspecified morphologic changes: Secondary | ICD-10-CM | POA: Diagnosis not present

## 2012-10-10 DIAGNOSIS — N186 End stage renal disease: Secondary | ICD-10-CM | POA: Diagnosis not present

## 2012-10-10 DIAGNOSIS — D631 Anemia in chronic kidney disease: Secondary | ICD-10-CM | POA: Diagnosis not present

## 2012-10-10 DIAGNOSIS — D509 Iron deficiency anemia, unspecified: Secondary | ICD-10-CM | POA: Diagnosis not present

## 2012-10-12 DIAGNOSIS — D509 Iron deficiency anemia, unspecified: Secondary | ICD-10-CM | POA: Diagnosis not present

## 2012-10-12 DIAGNOSIS — N039 Chronic nephritic syndrome with unspecified morphologic changes: Secondary | ICD-10-CM | POA: Diagnosis not present

## 2012-10-12 DIAGNOSIS — N186 End stage renal disease: Secondary | ICD-10-CM | POA: Diagnosis not present

## 2012-10-15 DIAGNOSIS — D509 Iron deficiency anemia, unspecified: Secondary | ICD-10-CM | POA: Diagnosis not present

## 2012-10-15 DIAGNOSIS — N186 End stage renal disease: Secondary | ICD-10-CM | POA: Diagnosis not present

## 2012-10-15 DIAGNOSIS — N039 Chronic nephritic syndrome with unspecified morphologic changes: Secondary | ICD-10-CM | POA: Diagnosis not present

## 2012-10-18 DIAGNOSIS — N186 End stage renal disease: Secondary | ICD-10-CM | POA: Diagnosis not present

## 2012-10-18 DIAGNOSIS — D631 Anemia in chronic kidney disease: Secondary | ICD-10-CM | POA: Diagnosis not present

## 2012-10-18 DIAGNOSIS — D509 Iron deficiency anemia, unspecified: Secondary | ICD-10-CM | POA: Diagnosis not present

## 2012-10-19 DIAGNOSIS — N186 End stage renal disease: Secondary | ICD-10-CM | POA: Diagnosis not present

## 2012-10-20 DIAGNOSIS — N186 End stage renal disease: Secondary | ICD-10-CM | POA: Diagnosis not present

## 2012-10-20 DIAGNOSIS — D631 Anemia in chronic kidney disease: Secondary | ICD-10-CM | POA: Diagnosis not present

## 2012-10-20 DIAGNOSIS — D509 Iron deficiency anemia, unspecified: Secondary | ICD-10-CM | POA: Diagnosis not present

## 2012-10-27 DIAGNOSIS — N186 End stage renal disease: Secondary | ICD-10-CM | POA: Diagnosis not present

## 2012-11-19 DIAGNOSIS — N186 End stage renal disease: Secondary | ICD-10-CM | POA: Diagnosis not present

## 2012-11-22 DIAGNOSIS — D509 Iron deficiency anemia, unspecified: Secondary | ICD-10-CM | POA: Diagnosis not present

## 2012-11-22 DIAGNOSIS — D631 Anemia in chronic kidney disease: Secondary | ICD-10-CM | POA: Diagnosis not present

## 2012-11-22 DIAGNOSIS — N186 End stage renal disease: Secondary | ICD-10-CM | POA: Diagnosis not present

## 2012-11-24 DIAGNOSIS — N186 End stage renal disease: Secondary | ICD-10-CM | POA: Diagnosis not present

## 2012-11-25 DIAGNOSIS — I10 Essential (primary) hypertension: Secondary | ICD-10-CM | POA: Diagnosis not present

## 2012-11-26 DIAGNOSIS — F17200 Nicotine dependence, unspecified, uncomplicated: Secondary | ICD-10-CM | POA: Diagnosis not present

## 2012-11-26 DIAGNOSIS — E2749 Other adrenocortical insufficiency: Secondary | ICD-10-CM | POA: Diagnosis not present

## 2012-11-26 DIAGNOSIS — I129 Hypertensive chronic kidney disease with stage 1 through stage 4 chronic kidney disease, or unspecified chronic kidney disease: Secondary | ICD-10-CM | POA: Diagnosis not present

## 2012-11-26 DIAGNOSIS — J90 Pleural effusion, not elsewhere classified: Secondary | ICD-10-CM | POA: Diagnosis not present

## 2012-11-26 DIAGNOSIS — M545 Low back pain, unspecified: Secondary | ICD-10-CM | POA: Diagnosis not present

## 2012-11-26 DIAGNOSIS — Z954 Presence of other heart-valve replacement: Secondary | ICD-10-CM | POA: Diagnosis not present

## 2012-11-26 DIAGNOSIS — Z7901 Long term (current) use of anticoagulants: Secondary | ICD-10-CM | POA: Diagnosis not present

## 2012-11-26 DIAGNOSIS — Z992 Dependence on renal dialysis: Secondary | ICD-10-CM | POA: Diagnosis not present

## 2012-11-26 DIAGNOSIS — R109 Unspecified abdominal pain: Secondary | ICD-10-CM | POA: Diagnosis not present

## 2012-11-26 DIAGNOSIS — Z79899 Other long term (current) drug therapy: Secondary | ICD-10-CM | POA: Diagnosis not present

## 2012-11-26 DIAGNOSIS — N189 Chronic kidney disease, unspecified: Secondary | ICD-10-CM | POA: Diagnosis not present

## 2012-11-26 DIAGNOSIS — Z905 Acquired absence of kidney: Secondary | ICD-10-CM | POA: Diagnosis not present

## 2012-11-26 DIAGNOSIS — R42 Dizziness and giddiness: Secondary | ICD-10-CM | POA: Diagnosis not present

## 2012-11-26 DIAGNOSIS — R059 Cough, unspecified: Secondary | ICD-10-CM | POA: Diagnosis not present

## 2012-12-17 DIAGNOSIS — N186 End stage renal disease: Secondary | ICD-10-CM | POA: Diagnosis not present

## 2012-12-20 DIAGNOSIS — D509 Iron deficiency anemia, unspecified: Secondary | ICD-10-CM | POA: Diagnosis not present

## 2012-12-20 DIAGNOSIS — N186 End stage renal disease: Secondary | ICD-10-CM | POA: Diagnosis not present

## 2012-12-20 DIAGNOSIS — Z992 Dependence on renal dialysis: Secondary | ICD-10-CM | POA: Diagnosis not present

## 2012-12-20 DIAGNOSIS — D631 Anemia in chronic kidney disease: Secondary | ICD-10-CM | POA: Diagnosis not present

## 2012-12-20 DIAGNOSIS — N039 Chronic nephritic syndrome with unspecified morphologic changes: Secondary | ICD-10-CM | POA: Diagnosis not present

## 2012-12-22 DIAGNOSIS — Z992 Dependence on renal dialysis: Secondary | ICD-10-CM | POA: Diagnosis not present

## 2012-12-22 DIAGNOSIS — N186 End stage renal disease: Secondary | ICD-10-CM | POA: Diagnosis not present

## 2012-12-22 DIAGNOSIS — D509 Iron deficiency anemia, unspecified: Secondary | ICD-10-CM | POA: Diagnosis not present

## 2012-12-22 DIAGNOSIS — D631 Anemia in chronic kidney disease: Secondary | ICD-10-CM | POA: Diagnosis not present

## 2012-12-24 DIAGNOSIS — N186 End stage renal disease: Secondary | ICD-10-CM | POA: Diagnosis not present

## 2012-12-24 DIAGNOSIS — N039 Chronic nephritic syndrome with unspecified morphologic changes: Secondary | ICD-10-CM | POA: Diagnosis not present

## 2012-12-24 DIAGNOSIS — Z992 Dependence on renal dialysis: Secondary | ICD-10-CM | POA: Diagnosis not present

## 2012-12-24 DIAGNOSIS — D509 Iron deficiency anemia, unspecified: Secondary | ICD-10-CM | POA: Diagnosis not present

## 2012-12-27 DIAGNOSIS — D631 Anemia in chronic kidney disease: Secondary | ICD-10-CM | POA: Diagnosis not present

## 2012-12-27 DIAGNOSIS — D509 Iron deficiency anemia, unspecified: Secondary | ICD-10-CM | POA: Diagnosis not present

## 2012-12-27 DIAGNOSIS — N039 Chronic nephritic syndrome with unspecified morphologic changes: Secondary | ICD-10-CM | POA: Diagnosis not present

## 2012-12-27 DIAGNOSIS — N186 End stage renal disease: Secondary | ICD-10-CM | POA: Diagnosis not present

## 2012-12-27 DIAGNOSIS — Z992 Dependence on renal dialysis: Secondary | ICD-10-CM | POA: Diagnosis not present

## 2012-12-29 DIAGNOSIS — Z992 Dependence on renal dialysis: Secondary | ICD-10-CM | POA: Diagnosis not present

## 2012-12-29 DIAGNOSIS — N186 End stage renal disease: Secondary | ICD-10-CM | POA: Diagnosis not present

## 2012-12-29 DIAGNOSIS — D631 Anemia in chronic kidney disease: Secondary | ICD-10-CM | POA: Diagnosis not present

## 2012-12-29 DIAGNOSIS — D509 Iron deficiency anemia, unspecified: Secondary | ICD-10-CM | POA: Diagnosis not present

## 2012-12-31 DIAGNOSIS — N039 Chronic nephritic syndrome with unspecified morphologic changes: Secondary | ICD-10-CM | POA: Diagnosis not present

## 2012-12-31 DIAGNOSIS — N186 End stage renal disease: Secondary | ICD-10-CM | POA: Diagnosis not present

## 2012-12-31 DIAGNOSIS — D509 Iron deficiency anemia, unspecified: Secondary | ICD-10-CM | POA: Diagnosis not present

## 2012-12-31 DIAGNOSIS — D631 Anemia in chronic kidney disease: Secondary | ICD-10-CM | POA: Diagnosis not present

## 2012-12-31 DIAGNOSIS — Z992 Dependence on renal dialysis: Secondary | ICD-10-CM | POA: Diagnosis not present

## 2013-01-03 DIAGNOSIS — D509 Iron deficiency anemia, unspecified: Secondary | ICD-10-CM | POA: Diagnosis not present

## 2013-01-03 DIAGNOSIS — N186 End stage renal disease: Secondary | ICD-10-CM | POA: Diagnosis not present

## 2013-01-03 DIAGNOSIS — N039 Chronic nephritic syndrome with unspecified morphologic changes: Secondary | ICD-10-CM | POA: Diagnosis not present

## 2013-01-03 DIAGNOSIS — Z992 Dependence on renal dialysis: Secondary | ICD-10-CM | POA: Diagnosis not present

## 2013-01-05 DIAGNOSIS — D631 Anemia in chronic kidney disease: Secondary | ICD-10-CM | POA: Diagnosis not present

## 2013-01-05 DIAGNOSIS — D509 Iron deficiency anemia, unspecified: Secondary | ICD-10-CM | POA: Diagnosis not present

## 2013-01-05 DIAGNOSIS — N039 Chronic nephritic syndrome with unspecified morphologic changes: Secondary | ICD-10-CM | POA: Diagnosis not present

## 2013-01-05 DIAGNOSIS — Z992 Dependence on renal dialysis: Secondary | ICD-10-CM | POA: Diagnosis not present

## 2013-01-05 DIAGNOSIS — N186 End stage renal disease: Secondary | ICD-10-CM | POA: Diagnosis not present

## 2013-01-07 DIAGNOSIS — N186 End stage renal disease: Secondary | ICD-10-CM | POA: Diagnosis not present

## 2013-01-07 DIAGNOSIS — D631 Anemia in chronic kidney disease: Secondary | ICD-10-CM | POA: Diagnosis not present

## 2013-01-07 DIAGNOSIS — D509 Iron deficiency anemia, unspecified: Secondary | ICD-10-CM | POA: Diagnosis not present

## 2013-01-07 DIAGNOSIS — Z992 Dependence on renal dialysis: Secondary | ICD-10-CM | POA: Diagnosis not present

## 2013-01-10 DIAGNOSIS — Z992 Dependence on renal dialysis: Secondary | ICD-10-CM | POA: Diagnosis not present

## 2013-01-10 DIAGNOSIS — D631 Anemia in chronic kidney disease: Secondary | ICD-10-CM | POA: Diagnosis not present

## 2013-01-10 DIAGNOSIS — D509 Iron deficiency anemia, unspecified: Secondary | ICD-10-CM | POA: Diagnosis not present

## 2013-01-10 DIAGNOSIS — N186 End stage renal disease: Secondary | ICD-10-CM | POA: Diagnosis not present

## 2013-01-12 DIAGNOSIS — N186 End stage renal disease: Secondary | ICD-10-CM | POA: Diagnosis not present

## 2013-01-12 DIAGNOSIS — Z992 Dependence on renal dialysis: Secondary | ICD-10-CM | POA: Diagnosis not present

## 2013-01-12 DIAGNOSIS — D509 Iron deficiency anemia, unspecified: Secondary | ICD-10-CM | POA: Diagnosis not present

## 2013-01-12 DIAGNOSIS — D631 Anemia in chronic kidney disease: Secondary | ICD-10-CM | POA: Diagnosis not present

## 2013-01-14 DIAGNOSIS — N186 End stage renal disease: Secondary | ICD-10-CM | POA: Diagnosis not present

## 2013-01-14 DIAGNOSIS — D509 Iron deficiency anemia, unspecified: Secondary | ICD-10-CM | POA: Diagnosis not present

## 2013-01-14 DIAGNOSIS — D631 Anemia in chronic kidney disease: Secondary | ICD-10-CM | POA: Diagnosis not present

## 2013-01-14 DIAGNOSIS — Z992 Dependence on renal dialysis: Secondary | ICD-10-CM | POA: Diagnosis not present

## 2013-01-17 DIAGNOSIS — D509 Iron deficiency anemia, unspecified: Secondary | ICD-10-CM | POA: Diagnosis not present

## 2013-01-17 DIAGNOSIS — N039 Chronic nephritic syndrome with unspecified morphologic changes: Secondary | ICD-10-CM | POA: Diagnosis not present

## 2013-01-17 DIAGNOSIS — Z992 Dependence on renal dialysis: Secondary | ICD-10-CM | POA: Diagnosis not present

## 2013-01-17 DIAGNOSIS — N186 End stage renal disease: Secondary | ICD-10-CM | POA: Diagnosis not present

## 2013-01-19 DIAGNOSIS — D631 Anemia in chronic kidney disease: Secondary | ICD-10-CM | POA: Diagnosis not present

## 2013-01-19 DIAGNOSIS — N186 End stage renal disease: Secondary | ICD-10-CM | POA: Diagnosis not present

## 2013-01-19 DIAGNOSIS — D509 Iron deficiency anemia, unspecified: Secondary | ICD-10-CM | POA: Diagnosis not present

## 2013-01-21 DIAGNOSIS — D631 Anemia in chronic kidney disease: Secondary | ICD-10-CM | POA: Diagnosis not present

## 2013-01-21 DIAGNOSIS — N186 End stage renal disease: Secondary | ICD-10-CM | POA: Diagnosis not present

## 2013-01-21 DIAGNOSIS — D509 Iron deficiency anemia, unspecified: Secondary | ICD-10-CM | POA: Diagnosis not present

## 2013-01-24 DIAGNOSIS — N039 Chronic nephritic syndrome with unspecified morphologic changes: Secondary | ICD-10-CM | POA: Diagnosis not present

## 2013-01-24 DIAGNOSIS — D509 Iron deficiency anemia, unspecified: Secondary | ICD-10-CM | POA: Diagnosis not present

## 2013-01-24 DIAGNOSIS — N186 End stage renal disease: Secondary | ICD-10-CM | POA: Diagnosis not present

## 2013-01-26 DIAGNOSIS — N039 Chronic nephritic syndrome with unspecified morphologic changes: Secondary | ICD-10-CM | POA: Diagnosis not present

## 2013-01-26 DIAGNOSIS — D509 Iron deficiency anemia, unspecified: Secondary | ICD-10-CM | POA: Diagnosis not present

## 2013-01-26 DIAGNOSIS — N186 End stage renal disease: Secondary | ICD-10-CM | POA: Diagnosis not present

## 2013-01-27 DIAGNOSIS — I10 Essential (primary) hypertension: Secondary | ICD-10-CM | POA: Diagnosis not present

## 2013-01-27 DIAGNOSIS — Z0000 Encounter for general adult medical examination without abnormal findings: Secondary | ICD-10-CM | POA: Diagnosis not present

## 2013-01-28 DIAGNOSIS — D509 Iron deficiency anemia, unspecified: Secondary | ICD-10-CM | POA: Diagnosis not present

## 2013-01-28 DIAGNOSIS — N186 End stage renal disease: Secondary | ICD-10-CM | POA: Diagnosis not present

## 2013-01-28 DIAGNOSIS — Z136 Encounter for screening for cardiovascular disorders: Secondary | ICD-10-CM | POA: Diagnosis not present

## 2013-01-28 DIAGNOSIS — N039 Chronic nephritic syndrome with unspecified morphologic changes: Secondary | ICD-10-CM | POA: Diagnosis not present

## 2013-01-28 DIAGNOSIS — R972 Elevated prostate specific antigen [PSA]: Secondary | ICD-10-CM | POA: Diagnosis not present

## 2013-01-28 DIAGNOSIS — Z125 Encounter for screening for malignant neoplasm of prostate: Secondary | ICD-10-CM | POA: Diagnosis not present

## 2013-01-31 DIAGNOSIS — N186 End stage renal disease: Secondary | ICD-10-CM | POA: Diagnosis not present

## 2013-01-31 DIAGNOSIS — D631 Anemia in chronic kidney disease: Secondary | ICD-10-CM | POA: Diagnosis not present

## 2013-01-31 DIAGNOSIS — D509 Iron deficiency anemia, unspecified: Secondary | ICD-10-CM | POA: Diagnosis not present

## 2013-02-02 DIAGNOSIS — N186 End stage renal disease: Secondary | ICD-10-CM | POA: Diagnosis not present

## 2013-02-02 DIAGNOSIS — N039 Chronic nephritic syndrome with unspecified morphologic changes: Secondary | ICD-10-CM | POA: Diagnosis not present

## 2013-02-02 DIAGNOSIS — D509 Iron deficiency anemia, unspecified: Secondary | ICD-10-CM | POA: Diagnosis not present

## 2013-02-04 DIAGNOSIS — D631 Anemia in chronic kidney disease: Secondary | ICD-10-CM | POA: Diagnosis not present

## 2013-02-04 DIAGNOSIS — D509 Iron deficiency anemia, unspecified: Secondary | ICD-10-CM | POA: Diagnosis not present

## 2013-02-04 DIAGNOSIS — N186 End stage renal disease: Secondary | ICD-10-CM | POA: Diagnosis not present

## 2013-02-07 DIAGNOSIS — N039 Chronic nephritic syndrome with unspecified morphologic changes: Secondary | ICD-10-CM | POA: Diagnosis not present

## 2013-02-07 DIAGNOSIS — N186 End stage renal disease: Secondary | ICD-10-CM | POA: Diagnosis not present

## 2013-02-07 DIAGNOSIS — D509 Iron deficiency anemia, unspecified: Secondary | ICD-10-CM | POA: Diagnosis not present

## 2013-02-09 DIAGNOSIS — N186 End stage renal disease: Secondary | ICD-10-CM | POA: Diagnosis not present

## 2013-02-09 DIAGNOSIS — D509 Iron deficiency anemia, unspecified: Secondary | ICD-10-CM | POA: Diagnosis not present

## 2013-02-09 DIAGNOSIS — N039 Chronic nephritic syndrome with unspecified morphologic changes: Secondary | ICD-10-CM | POA: Diagnosis not present

## 2013-02-11 DIAGNOSIS — D509 Iron deficiency anemia, unspecified: Secondary | ICD-10-CM | POA: Diagnosis not present

## 2013-02-11 DIAGNOSIS — D631 Anemia in chronic kidney disease: Secondary | ICD-10-CM | POA: Diagnosis not present

## 2013-02-11 DIAGNOSIS — N186 End stage renal disease: Secondary | ICD-10-CM | POA: Diagnosis not present

## 2013-02-14 DIAGNOSIS — N186 End stage renal disease: Secondary | ICD-10-CM | POA: Diagnosis not present

## 2013-02-14 DIAGNOSIS — D631 Anemia in chronic kidney disease: Secondary | ICD-10-CM | POA: Diagnosis not present

## 2013-02-14 DIAGNOSIS — D509 Iron deficiency anemia, unspecified: Secondary | ICD-10-CM | POA: Diagnosis not present

## 2013-02-15 DIAGNOSIS — N186 End stage renal disease: Secondary | ICD-10-CM | POA: Diagnosis not present

## 2013-02-15 DIAGNOSIS — Z954 Presence of other heart-valve replacement: Secondary | ICD-10-CM | POA: Diagnosis not present

## 2013-02-15 DIAGNOSIS — I359 Nonrheumatic aortic valve disorder, unspecified: Secondary | ICD-10-CM | POA: Diagnosis not present

## 2013-02-15 DIAGNOSIS — R3919 Other difficulties with micturition: Secondary | ICD-10-CM | POA: Diagnosis not present

## 2013-02-15 DIAGNOSIS — R0989 Other specified symptoms and signs involving the circulatory and respiratory systems: Secondary | ICD-10-CM | POA: Diagnosis not present

## 2013-02-15 DIAGNOSIS — F17200 Nicotine dependence, unspecified, uncomplicated: Secondary | ICD-10-CM | POA: Diagnosis not present

## 2013-02-15 DIAGNOSIS — J449 Chronic obstructive pulmonary disease, unspecified: Secondary | ICD-10-CM | POA: Diagnosis not present

## 2013-02-15 DIAGNOSIS — N139 Obstructive and reflux uropathy, unspecified: Secondary | ICD-10-CM | POA: Diagnosis not present

## 2013-02-15 DIAGNOSIS — I447 Left bundle-branch block, unspecified: Secondary | ICD-10-CM | POA: Diagnosis not present

## 2013-02-15 DIAGNOSIS — N200 Calculus of kidney: Secondary | ICD-10-CM | POA: Diagnosis not present

## 2013-02-15 DIAGNOSIS — R0602 Shortness of breath: Secondary | ICD-10-CM | POA: Diagnosis not present

## 2013-02-15 DIAGNOSIS — Z905 Acquired absence of kidney: Secondary | ICD-10-CM | POA: Diagnosis not present

## 2013-02-15 DIAGNOSIS — K029 Dental caries, unspecified: Secondary | ICD-10-CM | POA: Diagnosis not present

## 2013-02-15 DIAGNOSIS — I120 Hypertensive chronic kidney disease with stage 5 chronic kidney disease or end stage renal disease: Secondary | ICD-10-CM | POA: Diagnosis not present

## 2013-02-15 DIAGNOSIS — E2749 Other adrenocortical insufficiency: Secondary | ICD-10-CM | POA: Diagnosis not present

## 2013-02-15 DIAGNOSIS — F411 Generalized anxiety disorder: Secondary | ICD-10-CM | POA: Diagnosis not present

## 2013-02-16 DIAGNOSIS — N186 End stage renal disease: Secondary | ICD-10-CM | POA: Diagnosis not present

## 2013-02-16 DIAGNOSIS — N039 Chronic nephritic syndrome with unspecified morphologic changes: Secondary | ICD-10-CM | POA: Diagnosis not present

## 2013-02-16 DIAGNOSIS — D509 Iron deficiency anemia, unspecified: Secondary | ICD-10-CM | POA: Diagnosis not present

## 2013-02-18 DIAGNOSIS — D631 Anemia in chronic kidney disease: Secondary | ICD-10-CM | POA: Diagnosis not present

## 2013-02-18 DIAGNOSIS — N186 End stage renal disease: Secondary | ICD-10-CM | POA: Diagnosis not present

## 2013-02-18 DIAGNOSIS — D509 Iron deficiency anemia, unspecified: Secondary | ICD-10-CM | POA: Diagnosis not present

## 2013-02-18 DIAGNOSIS — N039 Chronic nephritic syndrome with unspecified morphologic changes: Secondary | ICD-10-CM | POA: Diagnosis not present

## 2013-02-21 DIAGNOSIS — N186 End stage renal disease: Secondary | ICD-10-CM | POA: Diagnosis not present

## 2013-02-21 DIAGNOSIS — N039 Chronic nephritic syndrome with unspecified morphologic changes: Secondary | ICD-10-CM | POA: Diagnosis not present

## 2013-02-21 DIAGNOSIS — D509 Iron deficiency anemia, unspecified: Secondary | ICD-10-CM | POA: Diagnosis not present

## 2013-02-22 DIAGNOSIS — F329 Major depressive disorder, single episode, unspecified: Secondary | ICD-10-CM | POA: Diagnosis present

## 2013-02-22 DIAGNOSIS — Z952 Presence of prosthetic heart valve: Secondary | ICD-10-CM | POA: Diagnosis not present

## 2013-02-22 DIAGNOSIS — D696 Thrombocytopenia, unspecified: Secondary | ICD-10-CM | POA: Diagnosis not present

## 2013-02-22 DIAGNOSIS — N200 Calculus of kidney: Secondary | ICD-10-CM | POA: Diagnosis not present

## 2013-02-22 DIAGNOSIS — R011 Cardiac murmur, unspecified: Secondary | ICD-10-CM | POA: Diagnosis present

## 2013-02-22 DIAGNOSIS — N186 End stage renal disease: Secondary | ICD-10-CM | POA: Diagnosis present

## 2013-02-22 DIAGNOSIS — I120 Hypertensive chronic kidney disease with stage 5 chronic kidney disease or end stage renal disease: Secondary | ICD-10-CM | POA: Diagnosis not present

## 2013-02-22 DIAGNOSIS — Z87442 Personal history of urinary calculi: Secondary | ICD-10-CM | POA: Diagnosis not present

## 2013-02-22 DIAGNOSIS — Z4902 Encounter for fitting and adjustment of peritoneal dialysis catheter: Secondary | ICD-10-CM | POA: Diagnosis not present

## 2013-02-22 DIAGNOSIS — E875 Hyperkalemia: Secondary | ICD-10-CM | POA: Diagnosis not present

## 2013-02-22 DIAGNOSIS — Z885 Allergy status to narcotic agent status: Secondary | ICD-10-CM | POA: Diagnosis not present

## 2013-02-22 DIAGNOSIS — Z954 Presence of other heart-valve replacement: Secondary | ICD-10-CM | POA: Diagnosis not present

## 2013-02-22 DIAGNOSIS — F411 Generalized anxiety disorder: Secondary | ICD-10-CM | POA: Diagnosis present

## 2013-02-22 DIAGNOSIS — I517 Cardiomegaly: Secondary | ICD-10-CM | POA: Diagnosis not present

## 2013-02-22 DIAGNOSIS — G589 Mononeuropathy, unspecified: Secondary | ICD-10-CM | POA: Diagnosis present

## 2013-02-22 DIAGNOSIS — D649 Anemia, unspecified: Secondary | ICD-10-CM | POA: Diagnosis present

## 2013-02-22 DIAGNOSIS — J4489 Other specified chronic obstructive pulmonary disease: Secondary | ICD-10-CM | POA: Diagnosis not present

## 2013-02-22 DIAGNOSIS — Z905 Acquired absence of kidney: Secondary | ICD-10-CM | POA: Diagnosis not present

## 2013-02-22 DIAGNOSIS — F17200 Nicotine dependence, unspecified, uncomplicated: Secondary | ICD-10-CM | POA: Diagnosis present

## 2013-02-22 DIAGNOSIS — E2749 Other adrenocortical insufficiency: Secondary | ICD-10-CM | POA: Diagnosis not present

## 2013-02-22 DIAGNOSIS — Z7901 Long term (current) use of anticoagulants: Secondary | ICD-10-CM | POA: Diagnosis not present

## 2013-02-22 DIAGNOSIS — R791 Abnormal coagulation profile: Secondary | ICD-10-CM | POA: Diagnosis present

## 2013-02-22 DIAGNOSIS — Z8673 Personal history of transient ischemic attack (TIA), and cerebral infarction without residual deficits: Secondary | ICD-10-CM | POA: Diagnosis not present

## 2013-02-22 DIAGNOSIS — N139 Obstructive and reflux uropathy, unspecified: Secondary | ICD-10-CM | POA: Diagnosis not present

## 2013-02-22 DIAGNOSIS — J449 Chronic obstructive pulmonary disease, unspecified: Secondary | ICD-10-CM | POA: Diagnosis not present

## 2013-02-22 DIAGNOSIS — K219 Gastro-esophageal reflux disease without esophagitis: Secondary | ICD-10-CM | POA: Diagnosis present

## 2013-02-22 DIAGNOSIS — E0789 Other specified disorders of thyroid: Secondary | ICD-10-CM | POA: Diagnosis present

## 2013-02-22 DIAGNOSIS — N2889 Other specified disorders of kidney and ureter: Secondary | ICD-10-CM | POA: Diagnosis not present

## 2013-02-22 DIAGNOSIS — F3289 Other specified depressive episodes: Secondary | ICD-10-CM | POA: Diagnosis not present

## 2013-02-22 DIAGNOSIS — I447 Left bundle-branch block, unspecified: Secondary | ICD-10-CM | POA: Diagnosis not present

## 2013-02-22 DIAGNOSIS — I359 Nonrheumatic aortic valve disorder, unspecified: Secondary | ICD-10-CM | POA: Diagnosis not present

## 2013-02-22 DIAGNOSIS — K5900 Constipation, unspecified: Secondary | ICD-10-CM | POA: Diagnosis not present

## 2013-02-22 DIAGNOSIS — Z992 Dependence on renal dialysis: Secondary | ICD-10-CM | POA: Diagnosis not present

## 2013-03-04 DIAGNOSIS — N186 End stage renal disease: Secondary | ICD-10-CM | POA: Diagnosis not present

## 2013-03-04 DIAGNOSIS — D509 Iron deficiency anemia, unspecified: Secondary | ICD-10-CM | POA: Diagnosis not present

## 2013-03-04 DIAGNOSIS — N039 Chronic nephritic syndrome with unspecified morphologic changes: Secondary | ICD-10-CM | POA: Diagnosis not present

## 2013-03-07 DIAGNOSIS — Z7901 Long term (current) use of anticoagulants: Secondary | ICD-10-CM | POA: Diagnosis not present

## 2013-03-07 DIAGNOSIS — D509 Iron deficiency anemia, unspecified: Secondary | ICD-10-CM | POA: Diagnosis not present

## 2013-03-07 DIAGNOSIS — N186 End stage renal disease: Secondary | ICD-10-CM | POA: Diagnosis not present

## 2013-03-07 DIAGNOSIS — D631 Anemia in chronic kidney disease: Secondary | ICD-10-CM | POA: Diagnosis not present

## 2013-03-09 DIAGNOSIS — N039 Chronic nephritic syndrome with unspecified morphologic changes: Secondary | ICD-10-CM | POA: Diagnosis not present

## 2013-03-09 DIAGNOSIS — D509 Iron deficiency anemia, unspecified: Secondary | ICD-10-CM | POA: Diagnosis not present

## 2013-03-09 DIAGNOSIS — N186 End stage renal disease: Secondary | ICD-10-CM | POA: Diagnosis not present

## 2013-03-11 DIAGNOSIS — D631 Anemia in chronic kidney disease: Secondary | ICD-10-CM | POA: Diagnosis not present

## 2013-03-11 DIAGNOSIS — D509 Iron deficiency anemia, unspecified: Secondary | ICD-10-CM | POA: Diagnosis not present

## 2013-03-11 DIAGNOSIS — N186 End stage renal disease: Secondary | ICD-10-CM | POA: Diagnosis not present

## 2013-03-12 DIAGNOSIS — D509 Iron deficiency anemia, unspecified: Secondary | ICD-10-CM | POA: Diagnosis not present

## 2013-03-12 DIAGNOSIS — N186 End stage renal disease: Secondary | ICD-10-CM | POA: Diagnosis not present

## 2013-03-12 DIAGNOSIS — N039 Chronic nephritic syndrome with unspecified morphologic changes: Secondary | ICD-10-CM | POA: Diagnosis not present

## 2013-03-14 DIAGNOSIS — N186 End stage renal disease: Secondary | ICD-10-CM | POA: Diagnosis not present

## 2013-03-15 DIAGNOSIS — N186 End stage renal disease: Secondary | ICD-10-CM | POA: Diagnosis not present

## 2013-03-16 DIAGNOSIS — N186 End stage renal disease: Secondary | ICD-10-CM | POA: Diagnosis not present

## 2013-03-17 DIAGNOSIS — N186 End stage renal disease: Secondary | ICD-10-CM | POA: Diagnosis not present

## 2013-03-18 DIAGNOSIS — N186 End stage renal disease: Secondary | ICD-10-CM | POA: Diagnosis not present

## 2013-03-19 DIAGNOSIS — N186 End stage renal disease: Secondary | ICD-10-CM | POA: Diagnosis not present

## 2013-03-20 DIAGNOSIS — N186 End stage renal disease: Secondary | ICD-10-CM | POA: Diagnosis not present

## 2013-03-21 DIAGNOSIS — N186 End stage renal disease: Secondary | ICD-10-CM | POA: Diagnosis not present

## 2013-03-22 DIAGNOSIS — N186 End stage renal disease: Secondary | ICD-10-CM | POA: Diagnosis not present

## 2013-03-23 DIAGNOSIS — Z7901 Long term (current) use of anticoagulants: Secondary | ICD-10-CM | POA: Diagnosis not present

## 2013-03-23 DIAGNOSIS — Z5181 Encounter for therapeutic drug level monitoring: Secondary | ICD-10-CM | POA: Diagnosis not present

## 2013-03-23 DIAGNOSIS — N186 End stage renal disease: Secondary | ICD-10-CM | POA: Diagnosis not present

## 2013-03-24 DIAGNOSIS — N186 End stage renal disease: Secondary | ICD-10-CM | POA: Diagnosis not present

## 2013-03-25 DIAGNOSIS — N039 Chronic nephritic syndrome with unspecified morphologic changes: Secondary | ICD-10-CM | POA: Diagnosis not present

## 2013-03-25 DIAGNOSIS — D631 Anemia in chronic kidney disease: Secondary | ICD-10-CM | POA: Diagnosis not present

## 2013-03-25 DIAGNOSIS — N186 End stage renal disease: Secondary | ICD-10-CM | POA: Diagnosis not present

## 2013-03-25 DIAGNOSIS — D509 Iron deficiency anemia, unspecified: Secondary | ICD-10-CM | POA: Diagnosis not present

## 2013-03-26 DIAGNOSIS — N186 End stage renal disease: Secondary | ICD-10-CM | POA: Diagnosis not present

## 2013-03-27 DIAGNOSIS — N186 End stage renal disease: Secondary | ICD-10-CM | POA: Diagnosis not present

## 2013-03-28 DIAGNOSIS — N186 End stage renal disease: Secondary | ICD-10-CM | POA: Diagnosis not present

## 2013-03-29 DIAGNOSIS — I359 Nonrheumatic aortic valve disorder, unspecified: Secondary | ICD-10-CM | POA: Diagnosis not present

## 2013-03-29 DIAGNOSIS — N186 End stage renal disease: Secondary | ICD-10-CM | POA: Diagnosis not present

## 2013-03-29 DIAGNOSIS — I10 Essential (primary) hypertension: Secondary | ICD-10-CM | POA: Diagnosis not present

## 2013-03-30 DIAGNOSIS — N186 End stage renal disease: Secondary | ICD-10-CM | POA: Diagnosis not present

## 2013-03-31 DIAGNOSIS — N186 End stage renal disease: Secondary | ICD-10-CM | POA: Diagnosis not present

## 2013-04-01 DIAGNOSIS — N186 End stage renal disease: Secondary | ICD-10-CM | POA: Diagnosis not present

## 2013-04-02 DIAGNOSIS — E8779 Other fluid overload: Secondary | ICD-10-CM | POA: Diagnosis not present

## 2013-04-02 DIAGNOSIS — N186 End stage renal disease: Secondary | ICD-10-CM | POA: Diagnosis not present

## 2013-04-03 DIAGNOSIS — N186 End stage renal disease: Secondary | ICD-10-CM | POA: Diagnosis not present

## 2013-04-04 DIAGNOSIS — N186 End stage renal disease: Secondary | ICD-10-CM | POA: Diagnosis not present

## 2013-04-05 DIAGNOSIS — N186 End stage renal disease: Secondary | ICD-10-CM | POA: Diagnosis not present

## 2013-04-06 DIAGNOSIS — D631 Anemia in chronic kidney disease: Secondary | ICD-10-CM | POA: Diagnosis not present

## 2013-04-06 DIAGNOSIS — E8779 Other fluid overload: Secondary | ICD-10-CM | POA: Diagnosis not present

## 2013-04-06 DIAGNOSIS — N186 End stage renal disease: Secondary | ICD-10-CM | POA: Diagnosis not present

## 2013-04-07 DIAGNOSIS — N186 End stage renal disease: Secondary | ICD-10-CM | POA: Diagnosis not present

## 2013-04-08 DIAGNOSIS — N186 End stage renal disease: Secondary | ICD-10-CM | POA: Diagnosis not present

## 2013-04-09 DIAGNOSIS — N186 End stage renal disease: Secondary | ICD-10-CM | POA: Diagnosis not present

## 2013-04-10 DIAGNOSIS — N186 End stage renal disease: Secondary | ICD-10-CM | POA: Diagnosis not present

## 2013-04-11 DIAGNOSIS — N186 End stage renal disease: Secondary | ICD-10-CM | POA: Diagnosis not present

## 2013-04-12 DIAGNOSIS — N186 End stage renal disease: Secondary | ICD-10-CM | POA: Diagnosis not present

## 2013-04-12 DIAGNOSIS — I359 Nonrheumatic aortic valve disorder, unspecified: Secondary | ICD-10-CM | POA: Diagnosis not present

## 2013-04-13 DIAGNOSIS — N186 End stage renal disease: Secondary | ICD-10-CM | POA: Diagnosis not present

## 2013-04-14 DIAGNOSIS — F329 Major depressive disorder, single episode, unspecified: Secondary | ICD-10-CM | POA: Diagnosis not present

## 2013-04-14 DIAGNOSIS — N186 End stage renal disease: Secondary | ICD-10-CM | POA: Diagnosis not present

## 2013-04-14 DIAGNOSIS — I120 Hypertensive chronic kidney disease with stage 5 chronic kidney disease or end stage renal disease: Secondary | ICD-10-CM | POA: Diagnosis not present

## 2013-04-14 DIAGNOSIS — M545 Low back pain, unspecified: Secondary | ICD-10-CM | POA: Diagnosis not present

## 2013-04-14 DIAGNOSIS — R079 Chest pain, unspecified: Secondary | ICD-10-CM | POA: Diagnosis not present

## 2013-04-14 DIAGNOSIS — N2581 Secondary hyperparathyroidism of renal origin: Secondary | ICD-10-CM | POA: Diagnosis not present

## 2013-04-14 DIAGNOSIS — F17200 Nicotine dependence, unspecified, uncomplicated: Secondary | ICD-10-CM | POA: Diagnosis not present

## 2013-04-14 DIAGNOSIS — R1013 Epigastric pain: Secondary | ICD-10-CM | POA: Diagnosis not present

## 2013-04-14 DIAGNOSIS — J90 Pleural effusion, not elsewhere classified: Secondary | ICD-10-CM | POA: Diagnosis not present

## 2013-04-14 DIAGNOSIS — Z885 Allergy status to narcotic agent status: Secondary | ICD-10-CM | POA: Diagnosis not present

## 2013-04-14 DIAGNOSIS — Z8249 Family history of ischemic heart disease and other diseases of the circulatory system: Secondary | ICD-10-CM | POA: Diagnosis not present

## 2013-04-14 DIAGNOSIS — Z992 Dependence on renal dialysis: Secondary | ICD-10-CM | POA: Diagnosis not present

## 2013-04-14 DIAGNOSIS — Z8349 Family history of other endocrine, nutritional and metabolic diseases: Secondary | ICD-10-CM | POA: Diagnosis not present

## 2013-04-14 DIAGNOSIS — J449 Chronic obstructive pulmonary disease, unspecified: Secondary | ICD-10-CM | POA: Diagnosis not present

## 2013-04-14 DIAGNOSIS — R0602 Shortness of breath: Secondary | ICD-10-CM

## 2013-04-14 DIAGNOSIS — Z79899 Other long term (current) drug therapy: Secondary | ICD-10-CM | POA: Diagnosis not present

## 2013-04-14 DIAGNOSIS — E119 Type 2 diabetes mellitus without complications: Secondary | ICD-10-CM | POA: Diagnosis not present

## 2013-04-14 DIAGNOSIS — R188 Other ascites: Secondary | ICD-10-CM | POA: Diagnosis not present

## 2013-04-14 DIAGNOSIS — Z7901 Long term (current) use of anticoagulants: Secondary | ICD-10-CM | POA: Diagnosis not present

## 2013-04-14 DIAGNOSIS — E2749 Other adrenocortical insufficiency: Secondary | ICD-10-CM | POA: Diagnosis not present

## 2013-04-14 DIAGNOSIS — Z836 Family history of other diseases of the respiratory system: Secondary | ICD-10-CM | POA: Diagnosis not present

## 2013-04-14 DIAGNOSIS — Z954 Presence of other heart-valve replacement: Secondary | ICD-10-CM | POA: Diagnosis not present

## 2013-04-15 DIAGNOSIS — I10 Essential (primary) hypertension: Secondary | ICD-10-CM | POA: Diagnosis not present

## 2013-04-15 DIAGNOSIS — N186 End stage renal disease: Secondary | ICD-10-CM | POA: Diagnosis not present

## 2013-04-15 DIAGNOSIS — M545 Low back pain: Secondary | ICD-10-CM | POA: Diagnosis not present

## 2013-04-15 DIAGNOSIS — I259 Chronic ischemic heart disease, unspecified: Secondary | ICD-10-CM | POA: Diagnosis not present

## 2013-04-15 DIAGNOSIS — I120 Hypertensive chronic kidney disease with stage 5 chronic kidney disease or end stage renal disease: Secondary | ICD-10-CM | POA: Diagnosis not present

## 2013-04-15 DIAGNOSIS — E1165 Type 2 diabetes mellitus with hyperglycemia: Secondary | ICD-10-CM | POA: Diagnosis not present

## 2013-04-15 DIAGNOSIS — E119 Type 2 diabetes mellitus without complications: Secondary | ICD-10-CM | POA: Diagnosis not present

## 2013-04-15 DIAGNOSIS — R079 Chest pain, unspecified: Secondary | ICD-10-CM | POA: Diagnosis not present

## 2013-04-16 DIAGNOSIS — N186 End stage renal disease: Secondary | ICD-10-CM | POA: Diagnosis not present

## 2013-04-17 DIAGNOSIS — N186 End stage renal disease: Secondary | ICD-10-CM | POA: Diagnosis not present

## 2013-04-18 DIAGNOSIS — N186 End stage renal disease: Secondary | ICD-10-CM | POA: Diagnosis not present

## 2013-04-19 DIAGNOSIS — I999 Unspecified disorder of circulatory system: Secondary | ICD-10-CM | POA: Diagnosis not present

## 2013-04-19 DIAGNOSIS — R079 Chest pain, unspecified: Secondary | ICD-10-CM | POA: Diagnosis not present

## 2013-04-19 DIAGNOSIS — D509 Iron deficiency anemia, unspecified: Secondary | ICD-10-CM | POA: Diagnosis not present

## 2013-04-19 DIAGNOSIS — N186 End stage renal disease: Secondary | ICD-10-CM | POA: Diagnosis not present

## 2013-04-19 DIAGNOSIS — D631 Anemia in chronic kidney disease: Secondary | ICD-10-CM | POA: Diagnosis not present

## 2013-04-20 DIAGNOSIS — N039 Chronic nephritic syndrome with unspecified morphologic changes: Secondary | ICD-10-CM | POA: Diagnosis not present

## 2013-04-20 DIAGNOSIS — D509 Iron deficiency anemia, unspecified: Secondary | ICD-10-CM | POA: Diagnosis not present

## 2013-04-20 DIAGNOSIS — N186 End stage renal disease: Secondary | ICD-10-CM | POA: Diagnosis not present

## 2013-04-21 DIAGNOSIS — N186 End stage renal disease: Secondary | ICD-10-CM | POA: Diagnosis not present

## 2013-04-21 DIAGNOSIS — D631 Anemia in chronic kidney disease: Secondary | ICD-10-CM | POA: Diagnosis not present

## 2013-04-21 DIAGNOSIS — D509 Iron deficiency anemia, unspecified: Secondary | ICD-10-CM | POA: Diagnosis not present

## 2013-04-22 DIAGNOSIS — D509 Iron deficiency anemia, unspecified: Secondary | ICD-10-CM | POA: Diagnosis not present

## 2013-04-22 DIAGNOSIS — N186 End stage renal disease: Secondary | ICD-10-CM | POA: Diagnosis not present

## 2013-04-22 DIAGNOSIS — N039 Chronic nephritic syndrome with unspecified morphologic changes: Secondary | ICD-10-CM | POA: Diagnosis not present

## 2013-04-23 DIAGNOSIS — D631 Anemia in chronic kidney disease: Secondary | ICD-10-CM | POA: Diagnosis not present

## 2013-04-23 DIAGNOSIS — N186 End stage renal disease: Secondary | ICD-10-CM | POA: Diagnosis not present

## 2013-04-23 DIAGNOSIS — D509 Iron deficiency anemia, unspecified: Secondary | ICD-10-CM | POA: Diagnosis not present

## 2013-04-24 DIAGNOSIS — N186 End stage renal disease: Secondary | ICD-10-CM | POA: Diagnosis not present

## 2013-04-24 DIAGNOSIS — D631 Anemia in chronic kidney disease: Secondary | ICD-10-CM | POA: Diagnosis not present

## 2013-04-24 DIAGNOSIS — D509 Iron deficiency anemia, unspecified: Secondary | ICD-10-CM | POA: Diagnosis not present

## 2013-04-25 DIAGNOSIS — N186 End stage renal disease: Secondary | ICD-10-CM | POA: Diagnosis not present

## 2013-04-25 DIAGNOSIS — D509 Iron deficiency anemia, unspecified: Secondary | ICD-10-CM | POA: Diagnosis not present

## 2013-04-25 DIAGNOSIS — D631 Anemia in chronic kidney disease: Secondary | ICD-10-CM | POA: Diagnosis not present

## 2013-04-26 DIAGNOSIS — N186 End stage renal disease: Secondary | ICD-10-CM | POA: Diagnosis not present

## 2013-04-26 DIAGNOSIS — D509 Iron deficiency anemia, unspecified: Secondary | ICD-10-CM | POA: Diagnosis not present

## 2013-04-26 DIAGNOSIS — D631 Anemia in chronic kidney disease: Secondary | ICD-10-CM | POA: Diagnosis not present

## 2013-04-27 DIAGNOSIS — N186 End stage renal disease: Secondary | ICD-10-CM | POA: Diagnosis not present

## 2013-04-27 DIAGNOSIS — D509 Iron deficiency anemia, unspecified: Secondary | ICD-10-CM | POA: Diagnosis not present

## 2013-04-27 DIAGNOSIS — N039 Chronic nephritic syndrome with unspecified morphologic changes: Secondary | ICD-10-CM | POA: Diagnosis not present

## 2013-04-29 DIAGNOSIS — I2510 Atherosclerotic heart disease of native coronary artery without angina pectoris: Secondary | ICD-10-CM | POA: Diagnosis present

## 2013-04-29 DIAGNOSIS — D631 Anemia in chronic kidney disease: Secondary | ICD-10-CM | POA: Diagnosis present

## 2013-04-29 DIAGNOSIS — I4891 Unspecified atrial fibrillation: Secondary | ICD-10-CM | POA: Diagnosis not present

## 2013-04-29 DIAGNOSIS — R079 Chest pain, unspecified: Secondary | ICD-10-CM | POA: Diagnosis not present

## 2013-04-29 DIAGNOSIS — R9439 Abnormal result of other cardiovascular function study: Secondary | ICD-10-CM | POA: Diagnosis not present

## 2013-04-29 DIAGNOSIS — R609 Edema, unspecified: Secondary | ICD-10-CM | POA: Diagnosis present

## 2013-04-29 DIAGNOSIS — I120 Hypertensive chronic kidney disease with stage 5 chronic kidney disease or end stage renal disease: Secondary | ICD-10-CM | POA: Diagnosis present

## 2013-04-29 DIAGNOSIS — I359 Nonrheumatic aortic valve disorder, unspecified: Secondary | ICD-10-CM | POA: Diagnosis not present

## 2013-04-29 DIAGNOSIS — I059 Rheumatic mitral valve disease, unspecified: Secondary | ICD-10-CM | POA: Diagnosis not present

## 2013-04-29 DIAGNOSIS — F411 Generalized anxiety disorder: Secondary | ICD-10-CM | POA: Diagnosis present

## 2013-04-29 DIAGNOSIS — Z992 Dependence on renal dialysis: Secondary | ICD-10-CM | POA: Diagnosis not present

## 2013-04-29 DIAGNOSIS — Z8249 Family history of ischemic heart disease and other diseases of the circulatory system: Secondary | ICD-10-CM | POA: Diagnosis not present

## 2013-04-29 DIAGNOSIS — IMO0002 Reserved for concepts with insufficient information to code with codable children: Secondary | ICD-10-CM | POA: Diagnosis not present

## 2013-04-29 DIAGNOSIS — E2749 Other adrenocortical insufficiency: Secondary | ICD-10-CM | POA: Diagnosis present

## 2013-04-29 DIAGNOSIS — I369 Nonrheumatic tricuspid valve disorder, unspecified: Secondary | ICD-10-CM | POA: Diagnosis not present

## 2013-04-29 DIAGNOSIS — J449 Chronic obstructive pulmonary disease, unspecified: Secondary | ICD-10-CM | POA: Diagnosis present

## 2013-04-29 DIAGNOSIS — R072 Precordial pain: Secondary | ICD-10-CM | POA: Diagnosis present

## 2013-04-29 DIAGNOSIS — F329 Major depressive disorder, single episode, unspecified: Secondary | ICD-10-CM | POA: Diagnosis present

## 2013-04-29 DIAGNOSIS — Z905 Acquired absence of kidney: Secondary | ICD-10-CM | POA: Diagnosis not present

## 2013-04-29 DIAGNOSIS — Z8673 Personal history of transient ischemic attack (TIA), and cerebral infarction without residual deficits: Secondary | ICD-10-CM | POA: Diagnosis not present

## 2013-04-29 DIAGNOSIS — Z7901 Long term (current) use of anticoagulants: Secondary | ICD-10-CM | POA: Diagnosis not present

## 2013-04-29 DIAGNOSIS — N2581 Secondary hyperparathyroidism of renal origin: Secondary | ICD-10-CM | POA: Diagnosis not present

## 2013-04-29 DIAGNOSIS — J90 Pleural effusion, not elsewhere classified: Secondary | ICD-10-CM | POA: Diagnosis not present

## 2013-04-29 DIAGNOSIS — K219 Gastro-esophageal reflux disease without esophagitis: Secondary | ICD-10-CM | POA: Diagnosis present

## 2013-04-29 DIAGNOSIS — Z885 Allergy status to narcotic agent status: Secondary | ICD-10-CM | POA: Diagnosis not present

## 2013-04-29 DIAGNOSIS — R791 Abnormal coagulation profile: Secondary | ICD-10-CM | POA: Diagnosis not present

## 2013-04-29 DIAGNOSIS — Z87442 Personal history of urinary calculi: Secondary | ICD-10-CM | POA: Diagnosis not present

## 2013-04-29 DIAGNOSIS — F17200 Nicotine dependence, unspecified, uncomplicated: Secondary | ICD-10-CM | POA: Diagnosis present

## 2013-04-29 DIAGNOSIS — I447 Left bundle-branch block, unspecified: Secondary | ICD-10-CM | POA: Diagnosis not present

## 2013-04-29 DIAGNOSIS — Z452 Encounter for adjustment and management of vascular access device: Secondary | ICD-10-CM | POA: Diagnosis not present

## 2013-04-29 DIAGNOSIS — Z954 Presence of other heart-valve replacement: Secondary | ICD-10-CM | POA: Diagnosis not present

## 2013-04-29 DIAGNOSIS — J811 Chronic pulmonary edema: Secondary | ICD-10-CM | POA: Diagnosis not present

## 2013-04-29 DIAGNOSIS — E875 Hyperkalemia: Secondary | ICD-10-CM | POA: Diagnosis not present

## 2013-04-29 DIAGNOSIS — I4949 Other premature depolarization: Secondary | ICD-10-CM | POA: Diagnosis not present

## 2013-04-29 DIAGNOSIS — N186 End stage renal disease: Secondary | ICD-10-CM | POA: Diagnosis present

## 2013-05-06 DIAGNOSIS — N186 End stage renal disease: Secondary | ICD-10-CM | POA: Diagnosis not present

## 2013-05-06 DIAGNOSIS — D509 Iron deficiency anemia, unspecified: Secondary | ICD-10-CM | POA: Diagnosis not present

## 2013-05-06 DIAGNOSIS — D631 Anemia in chronic kidney disease: Secondary | ICD-10-CM | POA: Diagnosis not present

## 2013-05-07 DIAGNOSIS — D509 Iron deficiency anemia, unspecified: Secondary | ICD-10-CM | POA: Diagnosis not present

## 2013-05-07 DIAGNOSIS — N186 End stage renal disease: Secondary | ICD-10-CM | POA: Diagnosis not present

## 2013-05-07 DIAGNOSIS — N039 Chronic nephritic syndrome with unspecified morphologic changes: Secondary | ICD-10-CM | POA: Diagnosis not present

## 2013-05-08 DIAGNOSIS — N186 End stage renal disease: Secondary | ICD-10-CM | POA: Diagnosis not present

## 2013-05-08 DIAGNOSIS — D631 Anemia in chronic kidney disease: Secondary | ICD-10-CM | POA: Diagnosis not present

## 2013-05-08 DIAGNOSIS — D509 Iron deficiency anemia, unspecified: Secondary | ICD-10-CM | POA: Diagnosis not present

## 2013-05-09 DIAGNOSIS — N186 End stage renal disease: Secondary | ICD-10-CM | POA: Diagnosis not present

## 2013-05-09 DIAGNOSIS — N039 Chronic nephritic syndrome with unspecified morphologic changes: Secondary | ICD-10-CM | POA: Diagnosis not present

## 2013-05-09 DIAGNOSIS — D509 Iron deficiency anemia, unspecified: Secondary | ICD-10-CM | POA: Diagnosis not present

## 2013-05-10 DIAGNOSIS — D509 Iron deficiency anemia, unspecified: Secondary | ICD-10-CM | POA: Diagnosis not present

## 2013-05-10 DIAGNOSIS — N186 End stage renal disease: Secondary | ICD-10-CM | POA: Diagnosis not present

## 2013-05-10 DIAGNOSIS — I10 Essential (primary) hypertension: Secondary | ICD-10-CM | POA: Diagnosis not present

## 2013-05-10 DIAGNOSIS — D631 Anemia in chronic kidney disease: Secondary | ICD-10-CM | POA: Diagnosis not present

## 2013-05-11 DIAGNOSIS — Z7901 Long term (current) use of anticoagulants: Secondary | ICD-10-CM | POA: Diagnosis not present

## 2013-05-11 DIAGNOSIS — D509 Iron deficiency anemia, unspecified: Secondary | ICD-10-CM | POA: Diagnosis not present

## 2013-05-11 DIAGNOSIS — Z5181 Encounter for therapeutic drug level monitoring: Secondary | ICD-10-CM | POA: Diagnosis not present

## 2013-05-11 DIAGNOSIS — N186 End stage renal disease: Secondary | ICD-10-CM | POA: Diagnosis not present

## 2013-05-11 DIAGNOSIS — N039 Chronic nephritic syndrome with unspecified morphologic changes: Secondary | ICD-10-CM | POA: Diagnosis not present

## 2013-05-12 DIAGNOSIS — D631 Anemia in chronic kidney disease: Secondary | ICD-10-CM | POA: Diagnosis not present

## 2013-05-12 DIAGNOSIS — N186 End stage renal disease: Secondary | ICD-10-CM | POA: Diagnosis not present

## 2013-05-12 DIAGNOSIS — D509 Iron deficiency anemia, unspecified: Secondary | ICD-10-CM | POA: Diagnosis not present

## 2013-05-13 DIAGNOSIS — F17200 Nicotine dependence, unspecified, uncomplicated: Secondary | ICD-10-CM | POA: Diagnosis not present

## 2013-05-13 DIAGNOSIS — N186 End stage renal disease: Secondary | ICD-10-CM | POA: Diagnosis not present

## 2013-05-13 DIAGNOSIS — R58 Hemorrhage, not elsewhere classified: Secondary | ICD-10-CM | POA: Diagnosis not present

## 2013-05-13 DIAGNOSIS — R9389 Abnormal findings on diagnostic imaging of other specified body structures: Secondary | ICD-10-CM | POA: Diagnosis not present

## 2013-05-13 DIAGNOSIS — I10 Essential (primary) hypertension: Secondary | ICD-10-CM | POA: Diagnosis not present

## 2013-05-13 DIAGNOSIS — IMO0002 Reserved for concepts with insufficient information to code with codable children: Secondary | ICD-10-CM | POA: Diagnosis not present

## 2013-05-13 DIAGNOSIS — Z79899 Other long term (current) drug therapy: Secondary | ICD-10-CM | POA: Diagnosis not present

## 2013-05-13 DIAGNOSIS — Z954 Presence of other heart-valve replacement: Secondary | ICD-10-CM | POA: Diagnosis not present

## 2013-05-13 DIAGNOSIS — F411 Generalized anxiety disorder: Secondary | ICD-10-CM | POA: Diagnosis not present

## 2013-05-13 DIAGNOSIS — Z7901 Long term (current) use of anticoagulants: Secondary | ICD-10-CM | POA: Diagnosis not present

## 2013-05-13 DIAGNOSIS — F329 Major depressive disorder, single episode, unspecified: Secondary | ICD-10-CM | POA: Diagnosis not present

## 2013-05-13 DIAGNOSIS — Z885 Allergy status to narcotic agent status: Secondary | ICD-10-CM | POA: Diagnosis not present

## 2013-05-13 DIAGNOSIS — Z8249 Family history of ischemic heart disease and other diseases of the circulatory system: Secondary | ICD-10-CM | POA: Diagnosis not present

## 2013-05-13 DIAGNOSIS — D509 Iron deficiency anemia, unspecified: Secondary | ICD-10-CM | POA: Diagnosis not present

## 2013-05-13 DIAGNOSIS — D68318 Other hemorrhagic disorder due to intrinsic circulating anticoagulants, antibodies, or inhibitors: Secondary | ICD-10-CM | POA: Diagnosis not present

## 2013-05-13 DIAGNOSIS — T82898A Other specified complication of vascular prosthetic devices, implants and grafts, initial encounter: Secondary | ICD-10-CM | POA: Diagnosis not present

## 2013-05-13 DIAGNOSIS — D649 Anemia, unspecified: Secondary | ICD-10-CM | POA: Diagnosis not present

## 2013-05-13 DIAGNOSIS — N039 Chronic nephritic syndrome with unspecified morphologic changes: Secondary | ICD-10-CM | POA: Diagnosis not present

## 2013-05-14 DIAGNOSIS — D631 Anemia in chronic kidney disease: Secondary | ICD-10-CM | POA: Diagnosis not present

## 2013-05-14 DIAGNOSIS — D509 Iron deficiency anemia, unspecified: Secondary | ICD-10-CM | POA: Diagnosis not present

## 2013-05-14 DIAGNOSIS — N186 End stage renal disease: Secondary | ICD-10-CM | POA: Diagnosis not present

## 2013-05-15 DIAGNOSIS — N186 End stage renal disease: Secondary | ICD-10-CM | POA: Diagnosis not present

## 2013-05-15 DIAGNOSIS — N039 Chronic nephritic syndrome with unspecified morphologic changes: Secondary | ICD-10-CM | POA: Diagnosis not present

## 2013-05-15 DIAGNOSIS — D509 Iron deficiency anemia, unspecified: Secondary | ICD-10-CM | POA: Diagnosis not present

## 2013-05-16 DIAGNOSIS — N039 Chronic nephritic syndrome with unspecified morphologic changes: Secondary | ICD-10-CM | POA: Diagnosis not present

## 2013-05-16 DIAGNOSIS — D509 Iron deficiency anemia, unspecified: Secondary | ICD-10-CM | POA: Diagnosis not present

## 2013-05-16 DIAGNOSIS — N186 End stage renal disease: Secondary | ICD-10-CM | POA: Diagnosis not present

## 2013-05-17 DIAGNOSIS — N186 End stage renal disease: Secondary | ICD-10-CM | POA: Diagnosis not present

## 2013-05-17 DIAGNOSIS — D631 Anemia in chronic kidney disease: Secondary | ICD-10-CM | POA: Diagnosis not present

## 2013-05-17 DIAGNOSIS — D509 Iron deficiency anemia, unspecified: Secondary | ICD-10-CM | POA: Diagnosis not present

## 2013-05-18 DIAGNOSIS — N186 End stage renal disease: Secondary | ICD-10-CM | POA: Diagnosis not present

## 2013-05-18 DIAGNOSIS — D631 Anemia in chronic kidney disease: Secondary | ICD-10-CM | POA: Diagnosis not present

## 2013-05-18 DIAGNOSIS — D509 Iron deficiency anemia, unspecified: Secondary | ICD-10-CM | POA: Diagnosis not present

## 2013-05-19 DIAGNOSIS — D509 Iron deficiency anemia, unspecified: Secondary | ICD-10-CM | POA: Diagnosis not present

## 2013-05-19 DIAGNOSIS — N039 Chronic nephritic syndrome with unspecified morphologic changes: Secondary | ICD-10-CM | POA: Diagnosis not present

## 2013-05-19 DIAGNOSIS — N186 End stage renal disease: Secondary | ICD-10-CM | POA: Diagnosis not present

## 2013-05-20 DIAGNOSIS — N039 Chronic nephritic syndrome with unspecified morphologic changes: Secondary | ICD-10-CM | POA: Diagnosis not present

## 2013-05-20 DIAGNOSIS — N186 End stage renal disease: Secondary | ICD-10-CM | POA: Diagnosis not present

## 2013-05-20 DIAGNOSIS — D631 Anemia in chronic kidney disease: Secondary | ICD-10-CM | POA: Diagnosis not present

## 2013-05-20 DIAGNOSIS — D509 Iron deficiency anemia, unspecified: Secondary | ICD-10-CM | POA: Diagnosis not present

## 2013-05-21 DIAGNOSIS — N186 End stage renal disease: Secondary | ICD-10-CM | POA: Diagnosis not present

## 2013-05-21 DIAGNOSIS — D509 Iron deficiency anemia, unspecified: Secondary | ICD-10-CM | POA: Diagnosis not present

## 2013-05-21 DIAGNOSIS — N039 Chronic nephritic syndrome with unspecified morphologic changes: Secondary | ICD-10-CM | POA: Diagnosis not present

## 2013-05-22 DIAGNOSIS — D509 Iron deficiency anemia, unspecified: Secondary | ICD-10-CM | POA: Diagnosis not present

## 2013-05-22 DIAGNOSIS — N186 End stage renal disease: Secondary | ICD-10-CM | POA: Diagnosis not present

## 2013-05-22 DIAGNOSIS — D631 Anemia in chronic kidney disease: Secondary | ICD-10-CM | POA: Diagnosis not present

## 2013-05-23 DIAGNOSIS — N039 Chronic nephritic syndrome with unspecified morphologic changes: Secondary | ICD-10-CM | POA: Diagnosis not present

## 2013-05-23 DIAGNOSIS — D509 Iron deficiency anemia, unspecified: Secondary | ICD-10-CM | POA: Diagnosis not present

## 2013-05-23 DIAGNOSIS — N186 End stage renal disease: Secondary | ICD-10-CM | POA: Diagnosis not present

## 2013-05-24 DIAGNOSIS — D509 Iron deficiency anemia, unspecified: Secondary | ICD-10-CM | POA: Diagnosis not present

## 2013-05-24 DIAGNOSIS — D631 Anemia in chronic kidney disease: Secondary | ICD-10-CM | POA: Diagnosis not present

## 2013-05-24 DIAGNOSIS — N186 End stage renal disease: Secondary | ICD-10-CM | POA: Diagnosis not present

## 2013-05-25 DIAGNOSIS — Z992 Dependence on renal dialysis: Secondary | ICD-10-CM | POA: Diagnosis not present

## 2013-05-25 DIAGNOSIS — Z5181 Encounter for therapeutic drug level monitoring: Secondary | ICD-10-CM | POA: Diagnosis not present

## 2013-05-25 DIAGNOSIS — Z7901 Long term (current) use of anticoagulants: Secondary | ICD-10-CM | POA: Diagnosis not present

## 2013-05-25 DIAGNOSIS — N186 End stage renal disease: Secondary | ICD-10-CM | POA: Diagnosis not present

## 2013-05-25 DIAGNOSIS — N039 Chronic nephritic syndrome with unspecified morphologic changes: Secondary | ICD-10-CM | POA: Diagnosis not present

## 2013-05-25 DIAGNOSIS — D509 Iron deficiency anemia, unspecified: Secondary | ICD-10-CM | POA: Diagnosis not present

## 2013-05-26 DIAGNOSIS — N186 End stage renal disease: Secondary | ICD-10-CM | POA: Diagnosis not present

## 2013-05-26 DIAGNOSIS — D631 Anemia in chronic kidney disease: Secondary | ICD-10-CM | POA: Diagnosis not present

## 2013-05-26 DIAGNOSIS — D509 Iron deficiency anemia, unspecified: Secondary | ICD-10-CM | POA: Diagnosis not present

## 2013-05-27 DIAGNOSIS — D509 Iron deficiency anemia, unspecified: Secondary | ICD-10-CM | POA: Diagnosis not present

## 2013-05-27 DIAGNOSIS — N186 End stage renal disease: Secondary | ICD-10-CM | POA: Diagnosis not present

## 2013-05-27 DIAGNOSIS — D631 Anemia in chronic kidney disease: Secondary | ICD-10-CM | POA: Diagnosis not present

## 2013-05-28 DIAGNOSIS — N039 Chronic nephritic syndrome with unspecified morphologic changes: Secondary | ICD-10-CM | POA: Diagnosis not present

## 2013-05-28 DIAGNOSIS — D509 Iron deficiency anemia, unspecified: Secondary | ICD-10-CM | POA: Diagnosis not present

## 2013-05-28 DIAGNOSIS — N186 End stage renal disease: Secondary | ICD-10-CM | POA: Diagnosis not present

## 2013-05-29 DIAGNOSIS — D509 Iron deficiency anemia, unspecified: Secondary | ICD-10-CM | POA: Diagnosis not present

## 2013-05-29 DIAGNOSIS — D631 Anemia in chronic kidney disease: Secondary | ICD-10-CM | POA: Diagnosis not present

## 2013-05-29 DIAGNOSIS — N186 End stage renal disease: Secondary | ICD-10-CM | POA: Diagnosis not present

## 2013-05-30 DIAGNOSIS — D631 Anemia in chronic kidney disease: Secondary | ICD-10-CM | POA: Diagnosis not present

## 2013-05-30 DIAGNOSIS — D509 Iron deficiency anemia, unspecified: Secondary | ICD-10-CM | POA: Diagnosis not present

## 2013-05-30 DIAGNOSIS — N186 End stage renal disease: Secondary | ICD-10-CM | POA: Diagnosis not present

## 2013-05-31 DIAGNOSIS — N186 End stage renal disease: Secondary | ICD-10-CM | POA: Diagnosis not present

## 2013-05-31 DIAGNOSIS — N039 Chronic nephritic syndrome with unspecified morphologic changes: Secondary | ICD-10-CM | POA: Diagnosis not present

## 2013-05-31 DIAGNOSIS — D509 Iron deficiency anemia, unspecified: Secondary | ICD-10-CM | POA: Diagnosis not present

## 2013-06-01 DIAGNOSIS — D509 Iron deficiency anemia, unspecified: Secondary | ICD-10-CM | POA: Diagnosis not present

## 2013-06-01 DIAGNOSIS — D631 Anemia in chronic kidney disease: Secondary | ICD-10-CM | POA: Diagnosis not present

## 2013-06-01 DIAGNOSIS — N186 End stage renal disease: Secondary | ICD-10-CM | POA: Diagnosis not present

## 2013-06-02 DIAGNOSIS — D509 Iron deficiency anemia, unspecified: Secondary | ICD-10-CM | POA: Diagnosis not present

## 2013-06-02 DIAGNOSIS — N039 Chronic nephritic syndrome with unspecified morphologic changes: Secondary | ICD-10-CM | POA: Diagnosis not present

## 2013-06-02 DIAGNOSIS — N186 End stage renal disease: Secondary | ICD-10-CM | POA: Diagnosis not present

## 2013-06-03 DIAGNOSIS — D509 Iron deficiency anemia, unspecified: Secondary | ICD-10-CM | POA: Diagnosis not present

## 2013-06-03 DIAGNOSIS — N039 Chronic nephritic syndrome with unspecified morphologic changes: Secondary | ICD-10-CM | POA: Diagnosis not present

## 2013-06-03 DIAGNOSIS — N186 End stage renal disease: Secondary | ICD-10-CM | POA: Diagnosis not present

## 2013-06-04 DIAGNOSIS — D509 Iron deficiency anemia, unspecified: Secondary | ICD-10-CM | POA: Diagnosis not present

## 2013-06-04 DIAGNOSIS — N186 End stage renal disease: Secondary | ICD-10-CM | POA: Diagnosis not present

## 2013-06-04 DIAGNOSIS — D631 Anemia in chronic kidney disease: Secondary | ICD-10-CM | POA: Diagnosis not present

## 2013-06-05 DIAGNOSIS — N186 End stage renal disease: Secondary | ICD-10-CM | POA: Diagnosis not present

## 2013-06-05 DIAGNOSIS — N039 Chronic nephritic syndrome with unspecified morphologic changes: Secondary | ICD-10-CM | POA: Diagnosis not present

## 2013-06-05 DIAGNOSIS — D509 Iron deficiency anemia, unspecified: Secondary | ICD-10-CM | POA: Diagnosis not present

## 2013-06-06 DIAGNOSIS — D509 Iron deficiency anemia, unspecified: Secondary | ICD-10-CM | POA: Diagnosis not present

## 2013-06-06 DIAGNOSIS — N186 End stage renal disease: Secondary | ICD-10-CM | POA: Diagnosis not present

## 2013-06-06 DIAGNOSIS — D631 Anemia in chronic kidney disease: Secondary | ICD-10-CM | POA: Diagnosis not present

## 2013-06-07 DIAGNOSIS — N186 End stage renal disease: Secondary | ICD-10-CM | POA: Diagnosis not present

## 2013-06-07 DIAGNOSIS — D509 Iron deficiency anemia, unspecified: Secondary | ICD-10-CM | POA: Diagnosis not present

## 2013-06-07 DIAGNOSIS — D631 Anemia in chronic kidney disease: Secondary | ICD-10-CM | POA: Diagnosis not present

## 2013-06-08 DIAGNOSIS — N186 End stage renal disease: Secondary | ICD-10-CM | POA: Diagnosis not present

## 2013-06-08 DIAGNOSIS — D631 Anemia in chronic kidney disease: Secondary | ICD-10-CM | POA: Diagnosis not present

## 2013-06-08 DIAGNOSIS — Z5181 Encounter for therapeutic drug level monitoring: Secondary | ICD-10-CM | POA: Diagnosis not present

## 2013-06-08 DIAGNOSIS — D509 Iron deficiency anemia, unspecified: Secondary | ICD-10-CM | POA: Diagnosis not present

## 2013-06-08 DIAGNOSIS — Z7901 Long term (current) use of anticoagulants: Secondary | ICD-10-CM | POA: Diagnosis not present

## 2013-06-09 DIAGNOSIS — D631 Anemia in chronic kidney disease: Secondary | ICD-10-CM | POA: Diagnosis not present

## 2013-06-09 DIAGNOSIS — D509 Iron deficiency anemia, unspecified: Secondary | ICD-10-CM | POA: Diagnosis not present

## 2013-06-09 DIAGNOSIS — N186 End stage renal disease: Secondary | ICD-10-CM | POA: Diagnosis not present

## 2013-06-10 DIAGNOSIS — D509 Iron deficiency anemia, unspecified: Secondary | ICD-10-CM | POA: Diagnosis not present

## 2013-06-10 DIAGNOSIS — N186 End stage renal disease: Secondary | ICD-10-CM | POA: Diagnosis not present

## 2013-06-10 DIAGNOSIS — D631 Anemia in chronic kidney disease: Secondary | ICD-10-CM | POA: Diagnosis not present

## 2013-06-11 DIAGNOSIS — N186 End stage renal disease: Secondary | ICD-10-CM | POA: Diagnosis not present

## 2013-06-11 DIAGNOSIS — D509 Iron deficiency anemia, unspecified: Secondary | ICD-10-CM | POA: Diagnosis not present

## 2013-06-11 DIAGNOSIS — D631 Anemia in chronic kidney disease: Secondary | ICD-10-CM | POA: Diagnosis not present

## 2013-06-12 DIAGNOSIS — D631 Anemia in chronic kidney disease: Secondary | ICD-10-CM | POA: Diagnosis not present

## 2013-06-12 DIAGNOSIS — D509 Iron deficiency anemia, unspecified: Secondary | ICD-10-CM | POA: Diagnosis not present

## 2013-06-12 DIAGNOSIS — N186 End stage renal disease: Secondary | ICD-10-CM | POA: Diagnosis not present

## 2013-06-13 DIAGNOSIS — D631 Anemia in chronic kidney disease: Secondary | ICD-10-CM | POA: Diagnosis not present

## 2013-06-13 DIAGNOSIS — N186 End stage renal disease: Secondary | ICD-10-CM | POA: Diagnosis not present

## 2013-06-13 DIAGNOSIS — D509 Iron deficiency anemia, unspecified: Secondary | ICD-10-CM | POA: Diagnosis not present

## 2013-06-14 DIAGNOSIS — D631 Anemia in chronic kidney disease: Secondary | ICD-10-CM | POA: Diagnosis not present

## 2013-06-14 DIAGNOSIS — N186 End stage renal disease: Secondary | ICD-10-CM | POA: Diagnosis not present

## 2013-06-14 DIAGNOSIS — D509 Iron deficiency anemia, unspecified: Secondary | ICD-10-CM | POA: Diagnosis not present

## 2013-06-15 DIAGNOSIS — D631 Anemia in chronic kidney disease: Secondary | ICD-10-CM | POA: Diagnosis not present

## 2013-06-15 DIAGNOSIS — D509 Iron deficiency anemia, unspecified: Secondary | ICD-10-CM | POA: Diagnosis not present

## 2013-06-15 DIAGNOSIS — N186 End stage renal disease: Secondary | ICD-10-CM | POA: Diagnosis not present

## 2013-06-16 DIAGNOSIS — D631 Anemia in chronic kidney disease: Secondary | ICD-10-CM | POA: Diagnosis not present

## 2013-06-16 DIAGNOSIS — N186 End stage renal disease: Secondary | ICD-10-CM | POA: Diagnosis not present

## 2013-06-16 DIAGNOSIS — D509 Iron deficiency anemia, unspecified: Secondary | ICD-10-CM | POA: Diagnosis not present

## 2013-06-17 DIAGNOSIS — D509 Iron deficiency anemia, unspecified: Secondary | ICD-10-CM | POA: Diagnosis not present

## 2013-06-17 DIAGNOSIS — D631 Anemia in chronic kidney disease: Secondary | ICD-10-CM | POA: Diagnosis not present

## 2013-06-17 DIAGNOSIS — N186 End stage renal disease: Secondary | ICD-10-CM | POA: Diagnosis not present

## 2013-06-18 DIAGNOSIS — N186 End stage renal disease: Secondary | ICD-10-CM | POA: Diagnosis not present

## 2013-06-18 DIAGNOSIS — D509 Iron deficiency anemia, unspecified: Secondary | ICD-10-CM | POA: Diagnosis not present

## 2013-06-18 DIAGNOSIS — D631 Anemia in chronic kidney disease: Secondary | ICD-10-CM | POA: Diagnosis not present

## 2013-06-19 DIAGNOSIS — N186 End stage renal disease: Secondary | ICD-10-CM | POA: Diagnosis not present

## 2013-06-19 DIAGNOSIS — D631 Anemia in chronic kidney disease: Secondary | ICD-10-CM | POA: Diagnosis not present

## 2013-06-19 DIAGNOSIS — D509 Iron deficiency anemia, unspecified: Secondary | ICD-10-CM | POA: Diagnosis not present

## 2013-06-20 DIAGNOSIS — D509 Iron deficiency anemia, unspecified: Secondary | ICD-10-CM | POA: Diagnosis not present

## 2013-06-20 DIAGNOSIS — Z23 Encounter for immunization: Secondary | ICD-10-CM | POA: Diagnosis not present

## 2013-06-20 DIAGNOSIS — D631 Anemia in chronic kidney disease: Secondary | ICD-10-CM | POA: Diagnosis not present

## 2013-06-20 DIAGNOSIS — N186 End stage renal disease: Secondary | ICD-10-CM | POA: Diagnosis not present

## 2013-06-22 DIAGNOSIS — N186 End stage renal disease: Secondary | ICD-10-CM | POA: Diagnosis not present

## 2013-06-22 DIAGNOSIS — Z5181 Encounter for therapeutic drug level monitoring: Secondary | ICD-10-CM | POA: Diagnosis not present

## 2013-06-22 DIAGNOSIS — Z7901 Long term (current) use of anticoagulants: Secondary | ICD-10-CM | POA: Diagnosis not present

## 2013-06-22 DIAGNOSIS — Z992 Dependence on renal dialysis: Secondary | ICD-10-CM | POA: Diagnosis not present

## 2013-07-06 DIAGNOSIS — N186 End stage renal disease: Secondary | ICD-10-CM | POA: Diagnosis not present

## 2013-07-06 DIAGNOSIS — I359 Nonrheumatic aortic valve disorder, unspecified: Secondary | ICD-10-CM | POA: Diagnosis not present

## 2013-07-20 DIAGNOSIS — D509 Iron deficiency anemia, unspecified: Secondary | ICD-10-CM | POA: Diagnosis not present

## 2013-07-20 DIAGNOSIS — N2581 Secondary hyperparathyroidism of renal origin: Secondary | ICD-10-CM | POA: Diagnosis not present

## 2013-07-20 DIAGNOSIS — N186 End stage renal disease: Secondary | ICD-10-CM | POA: Diagnosis not present

## 2013-07-20 DIAGNOSIS — D631 Anemia in chronic kidney disease: Secondary | ICD-10-CM | POA: Diagnosis not present

## 2013-07-27 DIAGNOSIS — N186 End stage renal disease: Secondary | ICD-10-CM | POA: Diagnosis not present

## 2013-07-27 DIAGNOSIS — I359 Nonrheumatic aortic valve disorder, unspecified: Secondary | ICD-10-CM | POA: Diagnosis not present

## 2013-07-27 DIAGNOSIS — Z992 Dependence on renal dialysis: Secondary | ICD-10-CM | POA: Diagnosis not present

## 2013-08-01 DIAGNOSIS — Z79899 Other long term (current) drug therapy: Secondary | ICD-10-CM | POA: Diagnosis not present

## 2013-08-01 DIAGNOSIS — Z885 Allergy status to narcotic agent status: Secondary | ICD-10-CM | POA: Diagnosis not present

## 2013-08-01 DIAGNOSIS — Z7901 Long term (current) use of anticoagulants: Secondary | ICD-10-CM | POA: Diagnosis not present

## 2013-08-01 DIAGNOSIS — E2749 Other adrenocortical insufficiency: Secondary | ICD-10-CM | POA: Diagnosis not present

## 2013-08-01 DIAGNOSIS — F17200 Nicotine dependence, unspecified, uncomplicated: Secondary | ICD-10-CM | POA: Diagnosis not present

## 2013-08-01 DIAGNOSIS — J449 Chronic obstructive pulmonary disease, unspecified: Secondary | ICD-10-CM | POA: Diagnosis not present

## 2013-08-01 DIAGNOSIS — Z992 Dependence on renal dialysis: Secondary | ICD-10-CM | POA: Diagnosis not present

## 2013-08-01 DIAGNOSIS — N186 End stage renal disease: Secondary | ICD-10-CM | POA: Diagnosis not present

## 2013-08-01 DIAGNOSIS — R079 Chest pain, unspecified: Secondary | ICD-10-CM | POA: Diagnosis not present

## 2013-08-01 DIAGNOSIS — Z954 Presence of other heart-valve replacement: Secondary | ICD-10-CM | POA: Diagnosis not present

## 2013-08-01 DIAGNOSIS — I959 Hypotension, unspecified: Secondary | ICD-10-CM | POA: Diagnosis not present

## 2013-08-02 DIAGNOSIS — R079 Chest pain, unspecified: Secondary | ICD-10-CM | POA: Diagnosis not present

## 2013-08-02 DIAGNOSIS — I959 Hypotension, unspecified: Secondary | ICD-10-CM | POA: Diagnosis not present

## 2013-08-02 DIAGNOSIS — J449 Chronic obstructive pulmonary disease, unspecified: Secondary | ICD-10-CM | POA: Diagnosis not present

## 2013-08-02 DIAGNOSIS — E2749 Other adrenocortical insufficiency: Secondary | ICD-10-CM | POA: Diagnosis not present

## 2013-08-02 DIAGNOSIS — N186 End stage renal disease: Secondary | ICD-10-CM | POA: Diagnosis not present

## 2013-08-05 DIAGNOSIS — R079 Chest pain, unspecified: Secondary | ICD-10-CM | POA: Diagnosis not present

## 2013-08-16 DIAGNOSIS — L0211 Cutaneous abscess of neck: Secondary | ICD-10-CM | POA: Diagnosis not present

## 2013-08-17 DIAGNOSIS — Z7901 Long term (current) use of anticoagulants: Secondary | ICD-10-CM | POA: Diagnosis not present

## 2013-08-17 DIAGNOSIS — Z5181 Encounter for therapeutic drug level monitoring: Secondary | ICD-10-CM | POA: Diagnosis not present

## 2013-08-17 DIAGNOSIS — N186 End stage renal disease: Secondary | ICD-10-CM | POA: Diagnosis not present

## 2013-08-20 DIAGNOSIS — N186 End stage renal disease: Secondary | ICD-10-CM | POA: Diagnosis not present

## 2013-08-20 DIAGNOSIS — D509 Iron deficiency anemia, unspecified: Secondary | ICD-10-CM | POA: Diagnosis not present

## 2013-08-20 DIAGNOSIS — D631 Anemia in chronic kidney disease: Secondary | ICD-10-CM | POA: Diagnosis not present

## 2013-08-21 DIAGNOSIS — D631 Anemia in chronic kidney disease: Secondary | ICD-10-CM | POA: Diagnosis not present

## 2013-08-21 DIAGNOSIS — D509 Iron deficiency anemia, unspecified: Secondary | ICD-10-CM | POA: Diagnosis not present

## 2013-08-21 DIAGNOSIS — N186 End stage renal disease: Secondary | ICD-10-CM | POA: Diagnosis not present

## 2013-08-22 DIAGNOSIS — D509 Iron deficiency anemia, unspecified: Secondary | ICD-10-CM | POA: Diagnosis not present

## 2013-08-22 DIAGNOSIS — N186 End stage renal disease: Secondary | ICD-10-CM | POA: Diagnosis not present

## 2013-08-22 DIAGNOSIS — D631 Anemia in chronic kidney disease: Secondary | ICD-10-CM | POA: Diagnosis not present

## 2013-08-22 DIAGNOSIS — Z992 Dependence on renal dialysis: Secondary | ICD-10-CM | POA: Diagnosis not present

## 2013-08-23 DIAGNOSIS — N186 End stage renal disease: Secondary | ICD-10-CM | POA: Diagnosis not present

## 2013-08-23 DIAGNOSIS — D631 Anemia in chronic kidney disease: Secondary | ICD-10-CM | POA: Diagnosis not present

## 2013-08-23 DIAGNOSIS — D509 Iron deficiency anemia, unspecified: Secondary | ICD-10-CM | POA: Diagnosis not present

## 2013-08-24 DIAGNOSIS — N186 End stage renal disease: Secondary | ICD-10-CM | POA: Diagnosis not present

## 2013-08-24 DIAGNOSIS — D509 Iron deficiency anemia, unspecified: Secondary | ICD-10-CM | POA: Diagnosis not present

## 2013-08-24 DIAGNOSIS — D631 Anemia in chronic kidney disease: Secondary | ICD-10-CM | POA: Diagnosis not present

## 2013-08-25 DIAGNOSIS — D509 Iron deficiency anemia, unspecified: Secondary | ICD-10-CM | POA: Diagnosis not present

## 2013-08-25 DIAGNOSIS — N186 End stage renal disease: Secondary | ICD-10-CM | POA: Diagnosis not present

## 2013-08-25 DIAGNOSIS — D631 Anemia in chronic kidney disease: Secondary | ICD-10-CM | POA: Diagnosis not present

## 2013-08-26 DIAGNOSIS — D509 Iron deficiency anemia, unspecified: Secondary | ICD-10-CM | POA: Diagnosis not present

## 2013-08-26 DIAGNOSIS — N186 End stage renal disease: Secondary | ICD-10-CM | POA: Diagnosis not present

## 2013-08-26 DIAGNOSIS — D631 Anemia in chronic kidney disease: Secondary | ICD-10-CM | POA: Diagnosis not present

## 2013-08-27 DIAGNOSIS — D631 Anemia in chronic kidney disease: Secondary | ICD-10-CM | POA: Diagnosis not present

## 2013-08-27 DIAGNOSIS — D509 Iron deficiency anemia, unspecified: Secondary | ICD-10-CM | POA: Diagnosis not present

## 2013-08-27 DIAGNOSIS — N186 End stage renal disease: Secondary | ICD-10-CM | POA: Diagnosis not present

## 2013-08-28 DIAGNOSIS — D509 Iron deficiency anemia, unspecified: Secondary | ICD-10-CM | POA: Diagnosis not present

## 2013-08-28 DIAGNOSIS — D631 Anemia in chronic kidney disease: Secondary | ICD-10-CM | POA: Diagnosis not present

## 2013-08-28 DIAGNOSIS — N186 End stage renal disease: Secondary | ICD-10-CM | POA: Diagnosis not present

## 2013-08-29 DIAGNOSIS — D509 Iron deficiency anemia, unspecified: Secondary | ICD-10-CM | POA: Diagnosis not present

## 2013-08-29 DIAGNOSIS — N186 End stage renal disease: Secondary | ICD-10-CM | POA: Diagnosis not present

## 2013-08-29 DIAGNOSIS — D631 Anemia in chronic kidney disease: Secondary | ICD-10-CM | POA: Diagnosis not present

## 2013-08-30 DIAGNOSIS — D631 Anemia in chronic kidney disease: Secondary | ICD-10-CM | POA: Diagnosis not present

## 2013-08-30 DIAGNOSIS — N186 End stage renal disease: Secondary | ICD-10-CM | POA: Diagnosis not present

## 2013-08-30 DIAGNOSIS — D509 Iron deficiency anemia, unspecified: Secondary | ICD-10-CM | POA: Diagnosis not present

## 2013-08-31 DIAGNOSIS — D631 Anemia in chronic kidney disease: Secondary | ICD-10-CM | POA: Diagnosis not present

## 2013-08-31 DIAGNOSIS — N186 End stage renal disease: Secondary | ICD-10-CM | POA: Diagnosis not present

## 2013-08-31 DIAGNOSIS — D509 Iron deficiency anemia, unspecified: Secondary | ICD-10-CM | POA: Diagnosis not present

## 2013-09-01 DIAGNOSIS — D631 Anemia in chronic kidney disease: Secondary | ICD-10-CM | POA: Diagnosis not present

## 2013-09-01 DIAGNOSIS — D509 Iron deficiency anemia, unspecified: Secondary | ICD-10-CM | POA: Diagnosis not present

## 2013-09-01 DIAGNOSIS — N186 End stage renal disease: Secondary | ICD-10-CM | POA: Diagnosis not present

## 2013-09-02 DIAGNOSIS — D509 Iron deficiency anemia, unspecified: Secondary | ICD-10-CM | POA: Diagnosis not present

## 2013-09-02 DIAGNOSIS — D631 Anemia in chronic kidney disease: Secondary | ICD-10-CM | POA: Diagnosis not present

## 2013-09-02 DIAGNOSIS — N186 End stage renal disease: Secondary | ICD-10-CM | POA: Diagnosis not present

## 2013-09-03 DIAGNOSIS — D509 Iron deficiency anemia, unspecified: Secondary | ICD-10-CM | POA: Diagnosis not present

## 2013-09-03 DIAGNOSIS — N186 End stage renal disease: Secondary | ICD-10-CM | POA: Diagnosis not present

## 2013-09-03 DIAGNOSIS — D631 Anemia in chronic kidney disease: Secondary | ICD-10-CM | POA: Diagnosis not present

## 2013-09-04 DIAGNOSIS — N186 End stage renal disease: Secondary | ICD-10-CM | POA: Diagnosis not present

## 2013-09-04 DIAGNOSIS — D631 Anemia in chronic kidney disease: Secondary | ICD-10-CM | POA: Diagnosis not present

## 2013-09-04 DIAGNOSIS — D509 Iron deficiency anemia, unspecified: Secondary | ICD-10-CM | POA: Diagnosis not present

## 2013-09-05 DIAGNOSIS — D509 Iron deficiency anemia, unspecified: Secondary | ICD-10-CM | POA: Diagnosis not present

## 2013-09-05 DIAGNOSIS — N186 End stage renal disease: Secondary | ICD-10-CM | POA: Diagnosis not present

## 2013-09-05 DIAGNOSIS — D631 Anemia in chronic kidney disease: Secondary | ICD-10-CM | POA: Diagnosis not present

## 2013-09-06 DIAGNOSIS — D509 Iron deficiency anemia, unspecified: Secondary | ICD-10-CM | POA: Diagnosis not present

## 2013-09-06 DIAGNOSIS — D631 Anemia in chronic kidney disease: Secondary | ICD-10-CM | POA: Diagnosis not present

## 2013-09-06 DIAGNOSIS — N186 End stage renal disease: Secondary | ICD-10-CM | POA: Diagnosis not present

## 2013-09-07 DIAGNOSIS — N186 End stage renal disease: Secondary | ICD-10-CM | POA: Diagnosis not present

## 2013-09-07 DIAGNOSIS — D631 Anemia in chronic kidney disease: Secondary | ICD-10-CM | POA: Diagnosis not present

## 2013-09-07 DIAGNOSIS — D509 Iron deficiency anemia, unspecified: Secondary | ICD-10-CM | POA: Diagnosis not present

## 2013-09-08 DIAGNOSIS — D509 Iron deficiency anemia, unspecified: Secondary | ICD-10-CM | POA: Diagnosis not present

## 2013-09-08 DIAGNOSIS — N186 End stage renal disease: Secondary | ICD-10-CM | POA: Diagnosis not present

## 2013-09-08 DIAGNOSIS — D631 Anemia in chronic kidney disease: Secondary | ICD-10-CM | POA: Diagnosis not present

## 2013-09-09 DIAGNOSIS — N186 End stage renal disease: Secondary | ICD-10-CM | POA: Diagnosis not present

## 2013-09-09 DIAGNOSIS — D631 Anemia in chronic kidney disease: Secondary | ICD-10-CM | POA: Diagnosis not present

## 2013-09-09 DIAGNOSIS — D509 Iron deficiency anemia, unspecified: Secondary | ICD-10-CM | POA: Diagnosis not present

## 2013-09-10 DIAGNOSIS — D509 Iron deficiency anemia, unspecified: Secondary | ICD-10-CM | POA: Diagnosis not present

## 2013-09-10 DIAGNOSIS — N186 End stage renal disease: Secondary | ICD-10-CM | POA: Diagnosis not present

## 2013-09-10 DIAGNOSIS — D631 Anemia in chronic kidney disease: Secondary | ICD-10-CM | POA: Diagnosis not present

## 2013-09-11 DIAGNOSIS — D631 Anemia in chronic kidney disease: Secondary | ICD-10-CM | POA: Diagnosis not present

## 2013-09-11 DIAGNOSIS — N186 End stage renal disease: Secondary | ICD-10-CM | POA: Diagnosis not present

## 2013-09-11 DIAGNOSIS — D509 Iron deficiency anemia, unspecified: Secondary | ICD-10-CM | POA: Diagnosis not present

## 2013-09-12 DIAGNOSIS — N186 End stage renal disease: Secondary | ICD-10-CM | POA: Diagnosis not present

## 2013-09-12 DIAGNOSIS — D631 Anemia in chronic kidney disease: Secondary | ICD-10-CM | POA: Diagnosis not present

## 2013-09-12 DIAGNOSIS — D509 Iron deficiency anemia, unspecified: Secondary | ICD-10-CM | POA: Diagnosis not present

## 2013-09-13 DIAGNOSIS — N186 End stage renal disease: Secondary | ICD-10-CM | POA: Diagnosis not present

## 2013-09-13 DIAGNOSIS — D631 Anemia in chronic kidney disease: Secondary | ICD-10-CM | POA: Diagnosis not present

## 2013-09-13 DIAGNOSIS — D509 Iron deficiency anemia, unspecified: Secondary | ICD-10-CM | POA: Diagnosis not present

## 2013-09-14 DIAGNOSIS — D509 Iron deficiency anemia, unspecified: Secondary | ICD-10-CM | POA: Diagnosis not present

## 2013-09-14 DIAGNOSIS — D631 Anemia in chronic kidney disease: Secondary | ICD-10-CM | POA: Diagnosis not present

## 2013-09-14 DIAGNOSIS — N186 End stage renal disease: Secondary | ICD-10-CM | POA: Diagnosis not present

## 2013-09-15 DIAGNOSIS — D509 Iron deficiency anemia, unspecified: Secondary | ICD-10-CM | POA: Diagnosis not present

## 2013-09-15 DIAGNOSIS — D631 Anemia in chronic kidney disease: Secondary | ICD-10-CM | POA: Diagnosis not present

## 2013-09-15 DIAGNOSIS — N186 End stage renal disease: Secondary | ICD-10-CM | POA: Diagnosis not present

## 2013-09-16 DIAGNOSIS — D631 Anemia in chronic kidney disease: Secondary | ICD-10-CM | POA: Diagnosis not present

## 2013-09-16 DIAGNOSIS — D509 Iron deficiency anemia, unspecified: Secondary | ICD-10-CM | POA: Diagnosis not present

## 2013-09-16 DIAGNOSIS — N186 End stage renal disease: Secondary | ICD-10-CM | POA: Diagnosis not present

## 2013-09-17 DIAGNOSIS — N186 End stage renal disease: Secondary | ICD-10-CM | POA: Diagnosis not present

## 2013-09-17 DIAGNOSIS — D631 Anemia in chronic kidney disease: Secondary | ICD-10-CM | POA: Diagnosis not present

## 2013-09-17 DIAGNOSIS — D509 Iron deficiency anemia, unspecified: Secondary | ICD-10-CM | POA: Diagnosis not present

## 2013-09-18 DIAGNOSIS — D509 Iron deficiency anemia, unspecified: Secondary | ICD-10-CM | POA: Diagnosis not present

## 2013-09-18 DIAGNOSIS — N186 End stage renal disease: Secondary | ICD-10-CM | POA: Diagnosis not present

## 2013-09-18 DIAGNOSIS — D631 Anemia in chronic kidney disease: Secondary | ICD-10-CM | POA: Diagnosis not present

## 2013-09-19 DIAGNOSIS — D509 Iron deficiency anemia, unspecified: Secondary | ICD-10-CM | POA: Diagnosis not present

## 2013-09-19 DIAGNOSIS — N186 End stage renal disease: Secondary | ICD-10-CM | POA: Diagnosis not present

## 2013-09-19 DIAGNOSIS — D631 Anemia in chronic kidney disease: Secondary | ICD-10-CM | POA: Diagnosis not present

## 2013-09-22 DIAGNOSIS — Z992 Dependence on renal dialysis: Secondary | ICD-10-CM | POA: Diagnosis not present

## 2013-09-22 DIAGNOSIS — N186 End stage renal disease: Secondary | ICD-10-CM | POA: Diagnosis not present

## 2013-10-11 DIAGNOSIS — Z7901 Long term (current) use of anticoagulants: Secondary | ICD-10-CM | POA: Diagnosis not present

## 2013-10-11 DIAGNOSIS — Z5181 Encounter for therapeutic drug level monitoring: Secondary | ICD-10-CM | POA: Diagnosis not present

## 2013-10-19 DIAGNOSIS — I10 Essential (primary) hypertension: Secondary | ICD-10-CM | POA: Diagnosis not present

## 2013-10-20 DIAGNOSIS — Z79899 Other long term (current) drug therapy: Secondary | ICD-10-CM | POA: Diagnosis not present

## 2013-10-20 DIAGNOSIS — D509 Iron deficiency anemia, unspecified: Secondary | ICD-10-CM | POA: Diagnosis not present

## 2013-10-20 DIAGNOSIS — D631 Anemia in chronic kidney disease: Secondary | ICD-10-CM | POA: Diagnosis not present

## 2013-10-20 DIAGNOSIS — N186 End stage renal disease: Secondary | ICD-10-CM | POA: Diagnosis not present

## 2013-10-20 DIAGNOSIS — Z5181 Encounter for therapeutic drug level monitoring: Secondary | ICD-10-CM | POA: Diagnosis not present

## 2013-10-25 DIAGNOSIS — Z992 Dependence on renal dialysis: Secondary | ICD-10-CM | POA: Diagnosis not present

## 2013-10-25 DIAGNOSIS — N186 End stage renal disease: Secondary | ICD-10-CM | POA: Diagnosis not present

## 2013-10-26 ENCOUNTER — Encounter (HOSPITAL_COMMUNITY): Payer: Self-pay | Admitting: Emergency Medicine

## 2013-10-26 ENCOUNTER — Ambulatory Visit (HOSPITAL_COMMUNITY): Admit: 2013-10-26 | Payer: Self-pay | Admitting: Interventional Cardiology

## 2013-10-26 ENCOUNTER — Emergency Department (HOSPITAL_COMMUNITY): Payer: Medicare Other

## 2013-10-26 ENCOUNTER — Inpatient Hospital Stay (HOSPITAL_COMMUNITY)
Admission: EM | Admit: 2013-10-26 | Discharge: 2013-10-28 | DRG: 867 | Disposition: A | Discharge status: Home / Self Care | Payer: Medicare Other | Attending: Family Medicine | Admitting: Family Medicine

## 2013-10-26 ENCOUNTER — Encounter (HOSPITAL_COMMUNITY)
Admission: EM | Disposition: A | Discharge status: Home / Self Care | Payer: Medicare Other | Source: Home / Self Care | Attending: Family Medicine

## 2013-10-26 DIAGNOSIS — J4489 Other specified chronic obstructive pulmonary disease: Secondary | ICD-10-CM | POA: Diagnosis present

## 2013-10-26 DIAGNOSIS — N2581 Secondary hyperparathyroidism of renal origin: Secondary | ICD-10-CM | POA: Diagnosis present

## 2013-10-26 DIAGNOSIS — Z954 Presence of other heart-valve replacement: Secondary | ICD-10-CM | POA: Diagnosis not present

## 2013-10-26 DIAGNOSIS — R0789 Other chest pain: Secondary | ICD-10-CM | POA: Diagnosis present

## 2013-10-26 DIAGNOSIS — M949 Disorder of cartilage, unspecified: Secondary | ICD-10-CM

## 2013-10-26 DIAGNOSIS — J811 Chronic pulmonary edema: Secondary | ICD-10-CM | POA: Diagnosis present

## 2013-10-26 DIAGNOSIS — M899 Disorder of bone, unspecified: Secondary | ICD-10-CM | POA: Diagnosis present

## 2013-10-26 DIAGNOSIS — Z992 Dependence on renal dialysis: Secondary | ICD-10-CM

## 2013-10-26 DIAGNOSIS — F329 Major depressive disorder, single episode, unspecified: Secondary | ICD-10-CM | POA: Diagnosis present

## 2013-10-26 DIAGNOSIS — Q605 Renal hypoplasia, unspecified: Secondary | ICD-10-CM

## 2013-10-26 DIAGNOSIS — Z9889 Other specified postprocedural states: Secondary | ICD-10-CM | POA: Diagnosis not present

## 2013-10-26 DIAGNOSIS — G894 Chronic pain syndrome: Secondary | ICD-10-CM | POA: Diagnosis present

## 2013-10-26 DIAGNOSIS — F411 Generalized anxiety disorder: Secondary | ICD-10-CM | POA: Diagnosis present

## 2013-10-26 DIAGNOSIS — E139 Other specified diabetes mellitus without complications: Secondary | ICD-10-CM | POA: Diagnosis present

## 2013-10-26 DIAGNOSIS — E2749 Other adrenocortical insufficiency: Secondary | ICD-10-CM | POA: Diagnosis present

## 2013-10-26 DIAGNOSIS — Y841 Kidney dialysis as the cause of abnormal reaction of the patient, or of later complication, without mention of misadventure at the time of the procedure: Secondary | ICD-10-CM | POA: Diagnosis present

## 2013-10-26 DIAGNOSIS — Q602 Renal agenesis, unspecified: Secondary | ICD-10-CM

## 2013-10-26 DIAGNOSIS — I2510 Atherosclerotic heart disease of native coronary artery without angina pectoris: Secondary | ICD-10-CM | POA: Diagnosis present

## 2013-10-26 DIAGNOSIS — E785 Hyperlipidemia, unspecified: Secondary | ICD-10-CM | POA: Diagnosis present

## 2013-10-26 DIAGNOSIS — Z86718 Personal history of other venous thrombosis and embolism: Secondary | ICD-10-CM

## 2013-10-26 DIAGNOSIS — Z7982 Long term (current) use of aspirin: Secondary | ICD-10-CM

## 2013-10-26 DIAGNOSIS — R079 Chest pain, unspecified: Secondary | ICD-10-CM

## 2013-10-26 DIAGNOSIS — J449 Chronic obstructive pulmonary disease, unspecified: Secondary | ICD-10-CM | POA: Diagnosis present

## 2013-10-26 DIAGNOSIS — F3289 Other specified depressive episodes: Secondary | ICD-10-CM | POA: Diagnosis present

## 2013-10-26 DIAGNOSIS — Z7901 Long term (current) use of anticoagulants: Secondary | ICD-10-CM | POA: Diagnosis not present

## 2013-10-26 DIAGNOSIS — I359 Nonrheumatic aortic valve disorder, unspecified: Secondary | ICD-10-CM | POA: Diagnosis present

## 2013-10-26 DIAGNOSIS — IMO0002 Reserved for concepts with insufficient information to code with codable children: Secondary | ICD-10-CM | POA: Diagnosis not present

## 2013-10-26 DIAGNOSIS — J438 Other emphysema: Secondary | ICD-10-CM | POA: Diagnosis not present

## 2013-10-26 DIAGNOSIS — Z79899 Other long term (current) drug therapy: Secondary | ICD-10-CM

## 2013-10-26 DIAGNOSIS — T8571XA Infection and inflammatory reaction due to peritoneal dialysis catheter, initial encounter: Secondary | ICD-10-CM

## 2013-10-26 DIAGNOSIS — I10 Essential (primary) hypertension: Secondary | ICD-10-CM

## 2013-10-26 DIAGNOSIS — Z8673 Personal history of transient ischemic attack (TIA), and cerebral infarction without residual deficits: Secondary | ICD-10-CM | POA: Diagnosis not present

## 2013-10-26 DIAGNOSIS — F17200 Nicotine dependence, unspecified, uncomplicated: Secondary | ICD-10-CM | POA: Diagnosis present

## 2013-10-26 DIAGNOSIS — R0602 Shortness of breath: Secondary | ICD-10-CM | POA: Diagnosis not present

## 2013-10-26 DIAGNOSIS — N19 Unspecified kidney failure: Secondary | ICD-10-CM

## 2013-10-26 DIAGNOSIS — T380X5A Adverse effect of glucocorticoids and synthetic analogues, initial encounter: Secondary | ICD-10-CM | POA: Diagnosis present

## 2013-10-26 DIAGNOSIS — D649 Anemia, unspecified: Secondary | ICD-10-CM | POA: Diagnosis present

## 2013-10-26 DIAGNOSIS — R1310 Dysphagia, unspecified: Secondary | ICD-10-CM | POA: Diagnosis present

## 2013-10-26 DIAGNOSIS — I120 Hypertensive chronic kidney disease with stage 5 chronic kidney disease or end stage renal disease: Secondary | ICD-10-CM | POA: Diagnosis present

## 2013-10-26 DIAGNOSIS — I959 Hypotension, unspecified: Secondary | ICD-10-CM | POA: Diagnosis present

## 2013-10-26 DIAGNOSIS — D72829 Elevated white blood cell count, unspecified: Secondary | ICD-10-CM | POA: Diagnosis present

## 2013-10-26 DIAGNOSIS — R0609 Other forms of dyspnea: Secondary | ICD-10-CM | POA: Diagnosis not present

## 2013-10-26 DIAGNOSIS — N186 End stage renal disease: Secondary | ICD-10-CM | POA: Diagnosis present

## 2013-10-26 DIAGNOSIS — J189 Pneumonia, unspecified organism: Secondary | ICD-10-CM | POA: Diagnosis present

## 2013-10-26 HISTORY — DX: Depression, unspecified: F32.A

## 2013-10-26 HISTORY — DX: End stage renal disease: N18.6

## 2013-10-26 HISTORY — DX: Major depressive disorder, single episode, unspecified: F32.9

## 2013-10-26 HISTORY — DX: Nonrheumatic aortic (valve) insufficiency: I35.1

## 2013-10-26 LAB — CG4 I-STAT (LACTIC ACID): Lactic Acid, Venous: 1.77 mmol/L (ref 0.5–2.2)

## 2013-10-26 LAB — CBC WITH DIFFERENTIAL/PLATELET
Basophils Absolute: 0.1 K/uL (ref 0.0–0.1)
Basophils Relative: 0 % (ref 0–1)
Eosinophils Absolute: 0.1 K/uL (ref 0.0–0.7)
Eosinophils Relative: 1 % (ref 0–5)
HCT: 37.2 % — ABNORMAL LOW (ref 39.0–52.0)
Hemoglobin: 11.8 g/dL — ABNORMAL LOW (ref 13.0–17.0)
Lymphocytes Relative: 3 % — ABNORMAL LOW (ref 12–46)
Lymphs Abs: 0.5 K/uL — ABNORMAL LOW (ref 0.7–4.0)
MCH: 33.1 pg (ref 26.0–34.0)
MCHC: 31.7 g/dL (ref 30.0–36.0)
MCV: 104.2 fL — ABNORMAL HIGH (ref 78.0–100.0)
Monocytes Absolute: 1.2 K/uL — ABNORMAL HIGH (ref 0.1–1.0)
Monocytes Relative: 7 % (ref 3–12)
Neutro Abs: 15 K/uL — ABNORMAL HIGH (ref 1.7–7.7)
Neutrophils Relative %: 89 % — ABNORMAL HIGH (ref 43–77)
Platelets: 193 K/uL (ref 150–400)
RBC: 3.57 MIL/uL — ABNORMAL LOW (ref 4.22–5.81)
RDW: 17.4 % — ABNORMAL HIGH (ref 11.5–15.5)
WBC: 16.9 K/uL — ABNORMAL HIGH (ref 4.0–10.5)

## 2013-10-26 LAB — BASIC METABOLIC PANEL WITH GFR
BUN: 52 mg/dL — ABNORMAL HIGH (ref 6–23)
CO2: 27 meq/L (ref 19–32)
Calcium: 8.5 mg/dL (ref 8.4–10.5)
Chloride: 93 meq/L — ABNORMAL LOW (ref 96–112)
Creatinine, Ser: 12.81 mg/dL — ABNORMAL HIGH (ref 0.50–1.35)
GFR calc Af Amer: 5 mL/min — ABNORMAL LOW
GFR calc non Af Amer: 4 mL/min — ABNORMAL LOW
Glucose, Bld: 129 mg/dL — ABNORMAL HIGH (ref 70–99)
Potassium: 4.2 meq/L (ref 3.7–5.3)
Sodium: 140 meq/L (ref 137–147)

## 2013-10-26 LAB — TROPONIN I

## 2013-10-26 LAB — POCT I-STAT TROPONIN I: TROPONIN I, POC: 0.04 ng/mL (ref 0.00–0.08)

## 2013-10-26 SURGERY — LEFT HEART CATHETERIZATION WITH CORONARY ANGIOGRAM
Anesthesia: LOCAL

## 2013-10-26 MED ORDER — MORPHINE SULFATE 2 MG/ML IJ SOLN
2.0000 mg | Freq: Once | INTRAMUSCULAR | Status: AC
  Administered 2013-10-26: 2 mg via INTRAVENOUS
  Filled 2013-10-26: qty 1

## 2013-10-26 MED ORDER — RENA-VITE PO TABS
1.0000 | ORAL_TABLET | Freq: Every day | ORAL | Status: DC
  Administered 2013-10-27: 1 via ORAL
  Filled 2013-10-26 (×2): qty 1

## 2013-10-26 MED ORDER — ONDANSETRON HCL 4 MG/2ML IJ SOLN
4.0000 mg | Freq: Once | INTRAMUSCULAR | Status: AC
  Administered 2013-10-26: 4 mg via INTRAVENOUS
  Filled 2013-10-26: qty 2

## 2013-10-26 MED ORDER — HYDROMORPHONE HCL PF 1 MG/ML IJ SOLN
1.0000 mg | Freq: Once | INTRAMUSCULAR | Status: AC
  Administered 2013-10-26: 1 mg via INTRAVENOUS
  Filled 2013-10-26: qty 1

## 2013-10-26 MED ORDER — DELFLEX-LM/2.5% DEXTROSE 396 MOSM/L IP SOLN: Freq: Once | INTRAPERITONEAL | Status: DC

## 2013-10-26 MED ORDER — HYDRALAZINE HCL 50 MG PO TABS
50.0000 mg | ORAL_TABLET | ORAL | Status: DC | PRN
  Filled 2013-10-26: qty 1

## 2013-10-26 MED ORDER — ASPIRIN 325 MG PO TABS
325.0000 mg | ORAL_TABLET | Freq: Every day | ORAL | Status: DC
  Administered 2013-10-27 – 2013-10-28 (×2): 325 mg via ORAL
  Filled 2013-10-26 (×2): qty 1

## 2013-10-26 MED ORDER — ASPIRIN 81 MG PO TABS: 324.0000 mg | ORAL_TABLET | Freq: Every day | ORAL | Status: DC

## 2013-10-26 MED ORDER — DEXTROSE 5 % IV SOLN: 1.0000 g | Freq: Three times a day (TID) | INTRAVENOUS | Status: DC

## 2013-10-26 MED ORDER — LORAZEPAM 1 MG PO TABS: 1.0000 mg | ORAL_TABLET | Freq: Three times a day (TID) | ORAL | Status: DC | PRN

## 2013-10-26 MED ORDER — SODIUM CHLORIDE 0.9 % IV BOLUS (SEPSIS)
250.0000 mL | Freq: Once | INTRAVENOUS | Status: AC
  Administered 2013-10-26: 250 mL via INTRAVENOUS

## 2013-10-26 MED ORDER — DEXTROSE 5 % IV SOLN
1.0000 g | Freq: Once | INTRAVENOUS | Status: AC
  Administered 2013-10-26: 1 g via INTRAVENOUS
  Filled 2013-10-26: qty 1

## 2013-10-26 MED ORDER — NITROGLYCERIN 0.4 MG SL SUBL
0.4000 mg | SUBLINGUAL_TABLET | SUBLINGUAL | Status: DC | PRN
  Administered 2013-10-27: 0.4 mg via SUBLINGUAL
  Filled 2013-10-26: qty 25

## 2013-10-26 MED ORDER — CINACALCET HCL 30 MG PO TABS
30.0000 mg | ORAL_TABLET | Freq: Every day | ORAL | Status: DC
  Administered 2013-10-27 – 2013-10-28 (×2): 30 mg via ORAL
  Filled 2013-10-26 (×3): qty 1

## 2013-10-26 MED ORDER — SEVELAMER CARBONATE 800 MG PO TABS
800.0000 mg | ORAL_TABLET | Freq: Three times a day (TID) | ORAL | Status: DC
  Administered 2013-10-27 – 2013-10-28 (×4): 800 mg via ORAL
  Filled 2013-10-26 (×7): qty 1

## 2013-10-26 MED ORDER — SERTRALINE HCL 50 MG PO TABS
50.0000 mg | ORAL_TABLET | Freq: Every day | ORAL | Status: DC
  Administered 2013-10-27 – 2013-10-28 (×2): 50 mg via ORAL
  Filled 2013-10-26 (×2): qty 1

## 2013-10-26 MED ORDER — SODIUM CHLORIDE 0.9 % IV SOLN
INTRAVENOUS | Status: DC
  Administered 2013-10-26: 18:00:00 via INTRAVENOUS

## 2013-10-26 MED ORDER — OXYCODONE HCL 5 MG PO TABS
5.0000 mg | ORAL_TABLET | Freq: Four times a day (QID) | ORAL | Status: DC | PRN
  Administered 2013-10-27: 5 mg via ORAL
  Filled 2013-10-26: qty 1

## 2013-10-26 MED ORDER — METOPROLOL SUCCINATE ER 50 MG PO TB24
50.0000 mg | ORAL_TABLET | Freq: Every day | ORAL | Status: DC
  Filled 2013-10-26: qty 1

## 2013-10-26 MED ORDER — DOCUSATE SODIUM 100 MG PO CAPS
100.0000 mg | ORAL_CAPSULE | Freq: Two times a day (BID) | ORAL | Status: DC
  Administered 2013-10-27 – 2013-10-28 (×4): 100 mg via ORAL
  Filled 2013-10-26 (×6): qty 1

## 2013-10-26 MED ORDER — OXYCODONE-ACETAMINOPHEN 10-325 MG PO TABS: 1.0000 | ORAL_TABLET | Freq: Four times a day (QID) | ORAL | Status: DC | PRN

## 2013-10-26 MED ORDER — OXYCODONE-ACETAMINOPHEN 5-325 MG PO TABS
1.0000 | ORAL_TABLET | Freq: Four times a day (QID) | ORAL | Status: DC | PRN
  Administered 2013-10-27 – 2013-10-28 (×2): 1 via ORAL
  Filled 2013-10-26: qty 1

## 2013-10-26 MED ORDER — NIFEDIPINE ER OSMOTIC RELEASE 90 MG PO TB24
90.0000 mg | ORAL_TABLET | Freq: Every day | ORAL | Status: DC
  Filled 2013-10-26: qty 1

## 2013-10-26 MED ORDER — DEXTROSE 5 % IV SOLN: 1.0000 g | INTRAVENOUS | Status: DC

## 2013-10-26 MED ORDER — DIALYVITE 3000 3 MG PO TABS: 1.0000 | ORAL_TABLET | Freq: Every day | ORAL | Status: DC

## 2013-10-26 MED ORDER — DELFLEX-LM/2.5% DEXTROSE 396 MOSM/L IP SOLN: INTRAPERITONEAL | Status: DC

## 2013-10-26 MED ORDER — HYDRALAZINE HCL 25 MG PO TABS
25.0000 mg | ORAL_TABLET | Freq: Four times a day (QID) | ORAL | Status: DC
  Administered 2013-10-27: 25 mg via ORAL
  Filled 2013-10-26 (×5): qty 1

## 2013-10-26 MED ORDER — HYDROCORTISONE 20 MG PO TABS
20.0000 mg | ORAL_TABLET | Freq: Every day | ORAL | Status: DC
  Filled 2013-10-26 (×2): qty 1

## 2013-10-26 MED ORDER — VANCOMYCIN HCL 10 G IV SOLR
1750.0000 mg | Freq: Once | INTRAVENOUS | Status: AC
  Administered 2013-10-26: 1750 mg via INTRAVENOUS
  Filled 2013-10-26: qty 1750

## 2013-10-26 NOTE — H&P (Signed)
Zachary [name] is an 53 y.o. male.   Chief Complaint: chest pain, HCAP   HPI: 31 YOM w/ prior hx/o ESRD on PD, aortic insufficiency s/p replacement x2 (bioprosthetic valve 2011--> mechanical revision 2013 @ [facility]) on coumadin, non obstructive CAD, HTN, 1/2 PPD smoker presenting with chest pain. Pt was resting at home when he developed progressive onset of central CP with radiation on L shoulder and arm earlier today. Has had similar sxs intermittently over the past week. Pt subsequently called EMS because of worsening pain and pt was brought to [facility] as code STEMI in setting of lateral ST changes on EKG. Cardiology was consulted. EKG changes felt to be more so chronic than acute. Pt had neg trop x 2 in the ER. Cardiology recs were cardiac rule out in house. A CXR was obtained that showed patchy infiltrate in the L lung base. Also with WBC count @ 16.9. BUN 52, Cr 12.8. Satting well on room air (though supplemental O2 placed in setting of ? STEMI).  Pt was started on HCAP tx in setting PD w/ vanc and cefepime. Blood cultures drawn. Pt also reported one episode of hemoptysis yesterday. Coughed up > silver dollar size amount of sputum mixed with bright red blood. No epistaxis. No more episodes since this point.   Past Medical History  Diagnosis Date  . ESRD (end stage renal disease)     a. Dx ~ 2008, HD x 2 yrs then PD since.  . Hypertension   . Anxiety   . Chest pain     a. 2011 cath: Normal coronary arteries;  b. 04/2013 Cath: nonobs dzs - [facility]  . Hyperlipidemia   . TIA (transient ischemic attack)   . Tobacco abuse     a. ongoing (10/2013)  . Adrenal insufficiency   . Venous thrombosis   . Chronic pain syndrome   . Staghorn calculus     a. s/p bilateral nephrectomy  . Aortic insufficiency     a. 2011 s/p bioprosthetic AVR @ [facility] with subsequent failure and redo AVR with mechanical valve (SJM 59mm) 01/2012->chronic coumadin;  b. 04/2013 TEE and Echo ([facility]): No LA clot, nl fxning AVR, EF  35-40%, glob HK, mod dil LA, mild AI, mod TR, RV syst dysfxn.  . Depression    No current facility-administered medications on file prior to encounter.   Current Outpatient Prescriptions on File Prior to Encounter  Medication Sig Dispense Refill  . aspirin 81 MG tablet Take 81 mg by mouth daily.      . B Complex-C-Biotin-E-Min-FA (DIALYVITE 3000 PO) Take by mouth as directed.      . cinacalcet (SENSIPAR) 30 MG tablet Take 30 mg by mouth daily.      Marland Kitchen docusate sodium (COLACE) 100 MG capsule Take 100 mg by mouth 2 (two) times daily.      . hydrALAZINE (APRESOLINE) 25 MG tablet Take 25 mg by mouth 4 (four) times daily.      . hydrALAZINE (APRESOLINE) 50 MG tablet Take 50 mg by mouth as needed. If b/p is greater than 160      . hydrocortisone (CORTEF) 20 MG tablet Take 20 mg by mouth daily.      Marland Kitchen LORazepam (ATIVAN) 1 MG tablet Take 1 mg by mouth every 8 (eight) hours as needed.      . metoprolol succinate (TOPROL-XL) 50 MG 24 hr tablet Take 50 mg by mouth daily. Take with or immediately following a meal.      .  NIFEdipine (ADALAT CC) 90 MG 24 hr tablet Take 90 mg by mouth daily.      . nitroGLYCERIN (NITROSTAT) 0.4 MG SL tablet Place 0.4 mg under the tongue every 5 (five) minutes as needed.      Marland Kitchen oxyCODONE-acetaminophen (PERCOCET) 10-325 MG per tablet Take 1 tablet by mouth every 6 (six) hours as needed.      . sertraline (ZOLOFT) 50 MG tablet Take 50 mg by mouth daily.      . sevelamer (RENVELA) 800 MG tablet Take 800 mg by mouth 3 (three) times daily with meals.         Past Surgical History  Procedure Laterality Date  . Nephrectomy      left secondary to staghorn calculus and chronic pyelonephritis    Family History  Problem Relation Age of Onset  . Hypertension    . Hyperlipidemia    . Coronary artery disease     Social History:  reports that he has been smoking.  He does not have any smokeless tobacco history on file. He reports that he does not drink alcohol or use illicit  drugs.  Allergies:  Allergies  Allergen Reactions  . Codeine      (Not in a hospital admission)  Results for orders placed during the hospital encounter of 10/26/13 (from the past 48 hour(s))  CBC WITH DIFFERENTIAL     Status: Abnormal   Collection Time    10/26/13  5:45 PM      Result Value Range   WBC 16.9 (*) 4.0 - 10.5 K/uL   RBC 3.57 (*) 4.22 - 5.81 MIL/uL   Hemoglobin 11.8 (*) 13.0 - 17.0 g/dL   HCT 37.2 (*) 39.0 - 52.0 %   MCV 104.2 (*) 78.0 - 100.0 fL   MCH 33.1  26.0 - 34.0 pg   MCHC 31.7  30.0 - 36.0 g/dL   RDW 17.4 (*) 11.5 - 15.5 %   Platelets 193  150 - 400 K/uL   Neutrophils Relative % 89 (*) 43 - 77 %   Neutro Abs 15.0 (*) 1.7 - 7.7 K/uL   Lymphocytes Relative 3 (*) 12 - 46 %   Lymphs Abs 0.5 (*) 0.7 - 4.0 K/uL   Monocytes Relative 7  3 - 12 %   Monocytes Absolute 1.2 (*) 0.1 - 1.0 K/uL   Eosinophils Relative 1  0 - 5 %   Eosinophils Absolute 0.1  0.0 - 0.7 K/uL   Basophils Relative 0  0 - 1 %   Basophils Absolute 0.1  0.0 - 0.1 K/uL  BASIC METABOLIC PANEL     Status: Abnormal   Collection Time    10/26/13  5:45 PM      Result Value Range   Sodium 140  137 - 147 mEq/L   Potassium 4.2  3.7 - 5.3 mEq/L   Chloride 93 (*) 96 - 112 mEq/L   CO2 27  19 - 32 mEq/L   Glucose, Bld 129 (*) 70 - 99 mg/dL   BUN 52 (*) 6 - 23 mg/dL   Creatinine, Ser 12.81 (*) 0.50 - 1.35 mg/dL   Calcium 8.5  8.4 - 10.5 mg/dL   GFR calc non Af Amer 4 (*) >90 mL/min   GFR calc Af Amer 5 (*) >90 mL/min   Comment: (NOTE)     The eGFR has been calculated using the CKD EPI equation.     This calculation has not been validated in all clinical situations.  eGFR's persistently <90 mL/min signify possible Chronic Kidney     Disease.  POCT I-STAT TROPONIN I     Status: None   Collection Time    10/26/13  5:54 PM      Result Value Range   Troponin i, poc 0.04  0.00 - 0.08 ng/mL   Comment 3            Comment: Due to the release kinetics of cTnI,     a negative result within the  first hours     of the onset of symptoms does not rule out     myocardial infarction with certainty.     If myocardial infarction is still suspected,     repeat the test at appropriate intervals.  CG4 I-STAT (LACTIC ACID)     Status: None   Collection Time    10/26/13  7:46 PM      Result Value Range   Lactic Acid, Venous 1.77  0.5 - 2.2 mmol/L  TROPONIN I     Status: None   Collection Time    10/26/13  8:35 PM      Result Value Range   Troponin I <0.30  <0.30 ng/mL   Comment:            Due to the release kinetics of cTnI,     a negative result within the first hours     of the onset of symptoms does not rule out     myocardial infarction with certainty.     If myocardial infarction is still suspected,     repeat the test at appropriate intervals.   Dg Chest Port 1 View  10/26/2013   CLINICAL DATA:  Chest pain and hypertension  EXAM: PORTABLE CHEST - 1 VIEW  COMPARISON:  August 01, 2013  FINDINGS: There is underlying emphysematous change. There is a small area of patchy infiltrate in the left base. Lungs elsewhere are clear. Heart is mildly enlarged. The pulmonary vascularity is within normal limits. No adenopathy. Patient is status post aortic valve replacement. No pneumothorax. No bone lesions.  IMPRESSION: There is a degree of underlying emphysematous change. There is a small area of patchy infiltrate in the left base. Elsewhere, lungs are clear. Heart mildly enlarged but stable.   Electronically Signed   By: Lowella Grip M.D.   On: 10/26/2013 18:12   EKG: sinus tachycardia, LBBB  Review of Systems  All other systems reviewed and are negative.    Blood pressure 112/81, pulse 124, resp. rate 16, height $RemoveBe'5\' 11"'ywRVVshiL$  (1.803 m), weight 83.915 kg (185 lb), SpO2 100.00%. Physical Exam  Constitutional:  Underweight, mildly disshevled appearing   HENT:  Head: Normocephalic and atraumatic.  Right Ear: External ear normal.  Left Ear: External ear normal.  Poor dentition   Eyes:  Conjunctivae are normal. Pupils are equal, round, and reactive to light.  Neck: Normal range of motion. Neck supple.  Cardiovascular: Normal rate and regular rhythm.   Respiratory: Effort normal and breath sounds normal.  GI: Soft. Bowel sounds are normal.  PD port on R side of abdomen CDI  Musculoskeletal: Normal range of motion.  Neurological: He is alert.  Skin: Skin is warm.     Assessment/Plan 68 YOM with prior history of multiple medical problems including ESRD on PD, aortic sufficiency s/p AVR on couamdin, non obstructive CAD presenting with chest pain and HCAP.  Chest Pain: Likely secondary to PNA. Will cycle cardiac enzymes. Risk stratification labs. Cardiology following. Start  on HCAP treatment.   HCAP: Start on vanc and cefepime. Urine strep and legionella. Sputum culture. Blood cultures. Trend WBC while in house.   Hemoptysis: Broad ddx for etiology including PNA, bronchitis, anticoagulation use, malignancy. 1 solitary episode in pt with multiple RFs. Will consult pulm formally in am.  Hemoglobin stable. No active signs of bleeding that would contraindicate anticoagulation currently.   Leukocytosis: likely secondary to HCAP. Treatment as above. Will trend. No clinical signs of infection from dialysis site.   ESRD: Renal consulted. Continue nightly PD. Continue ESRD meds.   HTN: BP stable. Continue home regimen.   Aortic insufficiency: Continue coumadin. Cards on board.   Anemia: hgb 11.8. Likely baseline ( was aorund 11 in 2011) in setting of anemia of renal disease. No active signs of bleeding. Check anemia panel.   FEN/GI: renal diet.   Dispo: pending further evaluation.   Code Status: Full code.      Shanda Howells 10/26/2013, 10:03 PM

## 2013-10-26 NOTE — ED Notes (Addendum)
Family at bedside. 

## 2013-10-26 NOTE — ED Notes (Addendum)
Pt has restricted arm hx in both arms but has not used left arm for dialysis in 2 years. MD gave order to use arm since patient is receiving peritoneal dialysis.

## 2013-10-26 NOTE — H&P (Deleted)
Patient ID: Zachary [name] MRN: [id number], DOB/AGE: 1961/09/24   Admit date: 10/26/2013   Primary Physician: Dr. Jerilee Field Primary Cardiologist: Dr. Duke Salvia - [facility]/ Thoracic surgery - Dr. Lunette Stands [facility]  Pt. Profile:  53 year old male with history of aortic valve disease and end-stage renal disease who is status post mechanical aVR and uses peritoneal dialysis at home, who presented to the ED tonight with a one to two-week history of intermittent sharp chest pain.  Problem List  Past Medical History  Diagnosis Date  . ESRD (end stage renal disease)     a. Dx ~ 2008, HD x 2 yrs then PD since.  . Hypertension   . Anxiety   . Chest pain     a. 2011 cath: Normal coronary arteries;  b. 04/2013 Cath: nonobs dzs - [facility]  . Hyperlipidemia   . TIA (transient ischemic attack)   . Tobacco abuse     a. ongoing (10/2013)  . Adrenal insufficiency   . Venous thrombosis   . Chronic pain syndrome   . Staghorn calculus     a. s/p bilateral nephrectomy  . Aortic insufficiency     a. 2011 s/p bioprosthetic AVR @ [facility] with subsequent failure and redo AVR with mechanical valve (SJM 54mm) 01/2012->chronic coumadin;  b. 04/2013 TEE and Echo ([facility]): No LA clot, nl fxning AVR, EF 35-40%, glob HK, mod dil LA, mild AI, mod TR, RV syst dysfxn.  . Depression     Past Surgical History  Procedure Laterality Date  . Nephrectomy      left secondary to staghorn calculus and chronic pyelonephritis     Allergies  Allergies  Allergen Reactions  . Codeine     HPI  53 year old male with the above complex problem list. He has a history of severe nephrolithiasis status post bilateral nephrectomy with subsequent requirement for hemodialysis initiated approximately 7 years ago and later peritoneal dialysis, which he has been doing by himself for the past 5 years. He also has a history of aortic insufficiency and stenosis and is status post bioprosthetic aortic valve replacement 2011.  Unfortunately, he developed severe prosthetic valve aortic stenosis and in April of 2013, underwent repeat aortic valve replacement with a mechanical valve. He has been on Coumadin since that time and his levels are followed by his primary care provider in [place], [place]. He continues to followup with cardiology in [place]. In July of 2014, patient was admitted with chest pain and had an abnormal stress test. This led to diagnostic catheterization which per report in care everywhere, showed nonobstructive CAD. He has since been medically managed.  Patient lives in [place], [place] by himself and continues to smoke cigarettes. Approximately 3 weeks ago, he developed cough and congestion and was placed on antibiotic therapy by his primary care provider. Approximately one week ago, he began to experience intermittent rest and exertional sharp left-sided chest pain radiating to his left arm and hand, associated with hand numbness and occasional dyspnea, lasting 2 or 3 minutes, and resolving spontaneously. Symptoms would occur multiple times per day. Approximately 3 days ago, he also began to experience dyspnea exertion. Chest pain recurred this morning prompting him to call EMS. ECG performed by EMS showed sinus tachycardia with some lateral ST depression. There was also concern for ST elevation. A code STEMI was called and the patient was taken to the [facility] for further evaluation. Here, he continues to complain of intermittent chest pain. Repeat ECG does not  show significant ST elevation and following review of his previous records, code STEMI has been called off.  Home Medications  Prior to Admission medications   Medication Sig Start Date End Date Taking? Authorizing Provider  aspirin 81 MG tablet Take 81 mg by mouth daily.    Historical Provider, MD  B Complex-C-Biotin-E-Min-FA (DIALYVITE 3000 PO) Take by mouth as directed.    Historical Provider, MD  cinacalcet (SENSIPAR) 30 MG tablet  Take 30 mg by mouth daily.    Historical Provider, MD  docusate sodium (COLACE) 100 MG capsule Take 100 mg by mouth 2 (two) times daily.    Historical Provider, MD  hydrALAZINE (APRESOLINE) 25 MG tablet Take 25 mg by mouth 4 (four) times daily.    Historical Provider, MD  hydrALAZINE (APRESOLINE) 50 MG tablet Take 50 mg by mouth as needed. If b/p is greater than 160    Historical Provider, MD  hydrocortisone (CORTEF) 20 MG tablet Take 20 mg by mouth daily.    Historical Provider, MD  LORazepam (ATIVAN) 1 MG tablet Take 1 mg by mouth every 8 (eight) hours as needed.    Historical Provider, MD  metoprolol succinate (TOPROL-XL) 50 MG 24 hr tablet Take 50 mg by mouth daily. Take with or immediately following a meal.    Historical Provider, MD  NIFEdipine (ADALAT CC) 90 MG 24 hr tablet Take 90 mg by mouth daily.    Historical Provider, MD  nitroGLYCERIN (NITROSTAT) 0.4 MG SL tablet Place 0.4 mg under the tongue every 5 (five) minutes as needed.    Historical Provider, MD  oxyCODONE-acetaminophen (PERCOCET) 10-325 MG per tablet Take 1 tablet by mouth every 6 (six) hours as needed.    Historical Provider, MD  sertraline (ZOLOFT) 50 MG tablet Take 50 mg by mouth daily.    Historical Provider, MD  sevelamer (RENVELA) 800 MG tablet Take 800 mg by mouth 3 (three) times daily with meals.    Historical Provider, MD   Family History  Family History  Problem Relation Age of Onset  . Hypertension    . Hyperlipidemia    . Coronary artery disease     Social History  History   Social History  . Marital Status: Single    Spouse Name: N/A    Number of Children: N/A  . Years of Education: N/A   Occupational History  . Not on file.   Social History Main Topics  . Smoking status: Current Every Day Smoker -- 1.00 packs/day for 30 years  . Smokeless tobacco: Not on file     Comment: currently smoking 1/2 ppd.  . Alcohol Use: No  . Drug Use: No  . Sexual Activity: Not Currently   Other Topics Concern    . Not on file   Social History Narrative   Lives in [place] by himself.    Review of Systems General:  occas chills, no fever, night sweats or weight changes.  Cardiovascular:  +++ chest pain, +++ dyspnea on exertion, no edema, orthopnea, palpitations, paroxysmal nocturnal dyspnea.  He thinks that he coughed up some blood yesterday. Dermatological: No rash, lesions/masses Respiratory: +++ prod cough, +++ dyspnea Urologic: No hematuria, dysuria Abdominal:   Says that he has intermittent bloody streaking of stools.  Not sure if he has a h/o hemorrhoids.  No nausea, vomiting, diarrhea, bright red blood per rectum, melena, or hematemesis Neurologic:  No visual changes, wkns, changes in mental status. All other systems reviewed and are otherwise negative except as noted above.  Physical Exam  Blood pressure 110/70, pulse 125, resp. rate 17, SpO2 97.00%.  General: Pleasant, NAD.  Poor dentition. Psych: Normal affect. Neuro: Alert and oriented X 3. Moves all extremities spontaneously. HEENT: Normal  Neck: Supple without bruits or JVD. Lungs:  Resp regular and unlabored, scatt rhonchi and wheezing throughout.Marland Kitchen Heart: RRR no s3, s4, or murmurs. Abdomen: Soft, non-tender, non-distended, BS + x 4.  Extremities: No clubbing, cyanosis or edema. DP/PT/Radials 2+ and equal bilaterally.  Labs  Troponin [facility] Test)  Recent Labs  10/26/13 1754  TROPIPOC 0.04   Lab Results  Component Value Date   WBC 16.9* 10/26/2013   HGB 11.8* 10/26/2013   HCT 37.2* 10/26/2013   MCV 104.2* 10/26/2013   PLT 193 10/26/2013   Lab Results  Component Value Date   CREATININE 12.81* 10/26/2013   BUN 52* 10/26/2013   NA 140 10/26/2013   K 4.2 10/26/2013   CL 93* 10/26/2013   CO2 27 10/26/2013      Radiology/Studies  IMPRESSION: There is a degree of underlying emphysematous change. There is a small area of patchy infiltrate in the left base. Elsewhere, lungs are clear. Heart mildly enlarged but  stable.  ECG  Sinus tach, inflat st dep.  ASSESSMENT AND PLAN  1.  Midsternal Chest Pain:  Pt presents with a 1-2 wk h/o intermittent sharp c/p.  He is tachycardic on ECG with lat st changes, which may be rate related in the setting of LVH.  He had nonobs dzs by cath in July @ [facility], thus doubt ischemia.  Initial troponin is normal despite frequent Ss over the past 1-2 wks.  Cycle ce.  Cont home meds.  If ce remain neg, would not pursue further ischemic eval during this admission and instead refer him back to his primary cardiologist @ [facility].  With leukocytosis, tachycardia, and left base infiltrate with hemoptysis, rec Internal Medicine admission.  We will be happy to follow along.  2.  ESRD:  On PD.   3.  HTN:  Stable.  Follow.  4.  AI/AS:  S/p AVR.  Cont coumadin.  5.  Anemia:  With h/o blood in stools and ? Hemoptysis yesterday.  CXR with poss pna. Guaiac stools.  6.  Tob Abuse:  Cessation advised.  Signed, Murray Hodgkins, NP 10/26/2013, 7:20 PM

## 2013-10-26 NOTE — Consult Note (Addendum)
[facility] Renal Consultation Note    Indication for Consultation:  Management of ESRD/hemodialysis; anemia, hypertension/volume and secondary hyperparathyroidism  HPI: Zachary [name] is a 53 y.o. male.  Pt is a 53yo WM with PMH sig for AI s/p AVR (initially bioprosthetic in 2011 and redo with mechanical valve 4/13) on chronic coumadin, HTN, CAD, tob abuse, and ESRD d/t bilateral stagorn calculi s/p bilateral nephrectomy.  He has been on HD for 7 years and on CCPD for 9 months (followed by Dr. Vertell Novak in [place]).  He presented to an outside hospital with a 1-2 week h/o sharp LSCP and was felt to have lateral EKG changes and was transferred to [facility] with code STEMI.  He was evaluated by cardiology who felt these were chronic changes and recommended r/o.  His CXR in the ED showed a patchy LLL infiltrate felt to be c/w CAP as well as having leukocytosis.  He is being admitted for r/o and IV abx for CAP.  We were consulted to help manage his CCPD and ESRD-related medical issues.    Past Medical History  Diagnosis Date  . ESRD (end stage renal disease)     a. Dx ~ 2008, HD x 2 yrs then PD since.  . Hypertension   . Anxiety   . Chest pain     a. 2011 cath: Normal coronary arteries;  b. 04/2013 Cath: nonobs dzs - [facility]  . Hyperlipidemia   . TIA (transient ischemic attack)   . Tobacco abuse     a. ongoing (10/2013)  . Adrenal insufficiency   . Venous thrombosis   . Chronic pain syndrome   . Staghorn calculus     a. s/p bilateral nephrectomy  . Aortic insufficiency     a. 2011 s/p bioprosthetic AVR @ [facility] with subsequent failure and redo AVR with mechanical valve (SJM 21mm) 01/2012->chronic coumadin;  b. 04/2013 TEE and Echo ([facility]): No LA clot, nl fxning AVR, EF 35-40%, glob HK, mod dil LA, mild AI, mod TR, RV syst dysfxn.  . Depression    Past Surgical History  Procedure Laterality Date  . Nephrectomy      left secondary to staghorn calculus and chronic pyelonephritis    Family History:   Family History  Problem Relation Age of Onset  . Hypertension    . Hyperlipidemia    . Coronary artery disease     Social History:  reports that he has been smoking.  He does not have any smokeless tobacco history on file. He reports that he does not drink alcohol or use illicit drugs. Allergies  Allergen Reactions  . Codeine    Prior to Admission medications   Medication Sig Start Date End Date Taking? Authorizing Provider  aspirin 81 MG tablet Take 81 mg by mouth daily.    Historical Provider, MD  B Complex-C-Biotin-E-Min-FA (DIALYVITE 3000 PO) Take by mouth as directed.    Historical Provider, MD  cinacalcet (SENSIPAR) 30 MG tablet Take 30 mg by mouth daily.    Historical Provider, MD  docusate sodium (COLACE) 100 MG capsule Take 100 mg by mouth 2 (two) times daily.    Historical Provider, MD  hydrALAZINE (APRESOLINE) 25 MG tablet Take 25 mg by mouth 4 (four) times daily.    Historical Provider, MD  hydrALAZINE (APRESOLINE) 50 MG tablet Take 50 mg by mouth as needed. If b/p is greater than 160    Historical Provider, MD  hydrocortisone (CORTEF) 20 MG tablet Take 20 mg by mouth daily.  Historical Provider, MD  LORazepam (ATIVAN) 1 MG tablet Take 1 mg by mouth every 8 (eight) hours as needed.    Historical Provider, MD  metoprolol succinate (TOPROL-XL) 50 MG 24 hr tablet Take 50 mg by mouth daily. Take with or immediately following a meal.    Historical Provider, MD  NIFEdipine (ADALAT CC) 90 MG 24 hr tablet Take 90 mg by mouth daily.    Historical Provider, MD  nitroGLYCERIN (NITROSTAT) 0.4 MG SL tablet Place 0.4 mg under the tongue every 5 (five) minutes as needed.    Historical Provider, MD  oxyCODONE-acetaminophen (PERCOCET) 10-325 MG per tablet Take 1 tablet by mouth every 6 (six) hours as needed.    Historical Provider, MD  sertraline (ZOLOFT) 50 MG tablet Take 50 mg by mouth daily.    Historical Provider, MD  sevelamer (RENVELA) 800 MG tablet Take 800 mg by  mouth 3 (three) times daily with meals.    Historical Provider, MD   Current Facility-Administered Medications  Medication Dose Route Frequency Provider Last Rate Last Dose  . 0.9 %  sodium chloride infusion   Intravenous Continuous Mervin Kung, MD      . Derrill Memo ON 10/27/2013] aspirin tablet 325 mg  325 mg Oral Daily Mervin Kung, MD      . Derrill Memo ON 10/28/2013] ceFEPIme (MAXIPIME) 1 g in dextrose 5 % 50 mL IVPB  1 g Intravenous Q48H [address] [facility]      . [START ON 10/27/2013] cinacalcet (SENSIPAR) tablet 30 mg  30 mg Oral Q breakfast Shanda Howells, MD      . docusate sodium (COLACE) capsule 100 mg  100 mg Oral BID Shanda Howells, MD      . hydrALAZINE (APRESOLINE) tablet 25 mg  25 mg Oral QID Shanda Howells, MD      . hydrALAZINE (APRESOLINE) tablet 50 mg  50 mg Oral PRN Shanda Howells, MD      . Derrill Memo ON 10/27/2013] hydrocortisone (CORTEF) tablet 20 mg  20 mg Oral Q breakfast Shanda Howells, MD      . LORazepam (ATIVAN) tablet 1 mg  1 mg Oral Q8H PRN Shanda Howells, MD      . Derrill Memo ON 10/27/2013] metoprolol succinate (TOPROL-XL) 24 hr tablet 50 mg  50 mg Oral Daily Shanda Howells, MD      . Derrill Memo ON 10/27/2013] multivitamin (RENA-VIT) tablet 1 tablet  1 tablet Oral QHS Mervin Kung, MD      . Derrill Memo ON 10/27/2013] NIFEdipine (PROCARDIA XL/ADALAT-CC) 24 hr tablet 90 mg  90 mg Oral Daily Shanda Howells, MD      . nitroGLYCERIN (NITROSTAT) SL tablet 0.4 mg  0.4 mg Sublingual Q5 min PRN Shanda Howells, MD      . oxyCODONE-acetaminophen (PERCOCET/ROXICET) 5-325 MG per tablet 1 tablet  1 tablet Oral Q6H PRN Mervin Kung, MD       And  . oxyCODONE (Oxy IR/ROXICODONE) immediate release tablet 5 mg  5 mg Oral Q6H PRN Mervin Kung, MD      . Derrill Memo ON 10/27/2013] sertraline (ZOLOFT) tablet 50 mg  50 mg Oral Daily Shanda Howells, MD      . Derrill Memo ON 10/27/2013] sevelamer carbonate (RENVELA) tablet 800 mg  800 mg Oral TID WC Shanda Howells, MD      . vancomycin (VANCOCIN) 1,750 mg in sodium  chloride 0.9 % 500 mL IVPB  1,750 mg Intravenous Once Thuy Dien Dang, RPH 250 mL/hr at 10/26/13 2156 1,750 mg at  10/26/13 2156   Current Outpatient Prescriptions  Medication Sig Dispense Refill  . aspirin 81 MG tablet Take 81 mg by mouth daily.      . B Complex-C-Biotin-E-Min-FA (DIALYVITE 3000 PO) Take by mouth as directed.      . cinacalcet (SENSIPAR) 30 MG tablet Take 30 mg by mouth daily.      Marland Kitchen docusate sodium (COLACE) 100 MG capsule Take 100 mg by mouth 2 (two) times daily.      . hydrALAZINE (APRESOLINE) 25 MG tablet Take 25 mg by mouth 4 (four) times daily.      . hydrALAZINE (APRESOLINE) 50 MG tablet Take 50 mg by mouth as needed. If b/p is greater than 160      . hydrocortisone (CORTEF) 20 MG tablet Take 20 mg by mouth daily.      Marland Kitchen LORazepam (ATIVAN) 1 MG tablet Take 1 mg by mouth every 8 (eight) hours as needed.      . metoprolol succinate (TOPROL-XL) 50 MG 24 hr tablet Take 50 mg by mouth daily. Take with or immediately following a meal.      . NIFEdipine (ADALAT CC) 90 MG 24 hr tablet Take 90 mg by mouth daily.      . nitroGLYCERIN (NITROSTAT) 0.4 MG SL tablet Place 0.4 mg under the tongue every 5 (five) minutes as needed.      Marland Kitchen oxyCODONE-acetaminophen (PERCOCET) 10-325 MG per tablet Take 1 tablet by mouth every 6 (six) hours as needed.      . sertraline (ZOLOFT) 50 MG tablet Take 50 mg by mouth daily.      . sevelamer (RENVELA) 800 MG tablet Take 800 mg by mouth 3 (three) times daily with meals.       Labs: Basic Metabolic Panel:  Recent Labs Lab 10/26/13 1745  NA 140  K 4.2  CL 93*  CO2 27  GLUCOSE 129*  BUN 52*  CREATININE 12.81*  CALCIUM 8.5   Liver Function Tests: No results found for this basename: AST, ALT, ALKPHOS, BILITOT, PROT, ALBUMIN,  in the last 168 hours No results found for this basename: LIPASE, AMYLASE,  in the last 168 hours No results found for this basename: AMMONIA,  in the last 168 hours CBC:  Recent Labs Lab 10/26/13 1745  WBC 16.9*   NEUTROABS 15.0*  HGB 11.8*  HCT 37.2*  MCV 104.2*  PLT 193   Cardiac Enzymes:  Recent Labs Lab 10/26/13 2035  TROPONINI <0.30   CBG: No results found for this basename: GLUCAP,  in the last 168 hours Iron Studies: No results found for this basename: IRON, TIBC, TRANSFERRIN, FERRITIN,  in the last 72 hours Studies/Results: Dg Chest Port 1 View  10/26/2013   CLINICAL DATA:  Chest pain and hypertension  EXAM: PORTABLE CHEST - 1 VIEW  COMPARISON:  August 01, 2013  FINDINGS: There is underlying emphysematous change. There is a small area of patchy infiltrate in the left base. Lungs elsewhere are clear. Heart is mildly enlarged. The pulmonary vascularity is within normal limits. No adenopathy. Patient is status post aortic valve replacement. No pneumothorax. No bone lesions.  IMPRESSION: There is a degree of underlying emphysematous change. There is a small area of patchy infiltrate in the left base. Elsewhere, lungs are clear. Heart mildly enlarged but stable.   Electronically Signed   By: Lowella Grip M.D.   On: 10/26/2013 18:12    ROS: A comprehensive review of systems was negative except for: Respiratory: positive for cough, hemoptysis  and sputum Cardiovascular: positive for chest pain Physical Exam: Filed Vitals:   10/26/13 1900 10/26/13 2050 10/26/13 2100 10/26/13 2206  BP: 109/65 125/66 112/81 100/61  Pulse: 124 126 124 120  TempSrc:  Oral    Resp: 19 15 16 19   Height: 5\' 11"  (1.803 m)     Weight: 83.915 kg (185 lb)     SpO2: 99% 98% 100% 98%      Weight change:  No intake or output data in the 24 hours ending 10/26/13 2222 BP 100/61  Pulse 120  Resp 19  Ht 5\' 11"  (1.803 m)  Wt 83.915 kg (185 lb)  BMI 25.81 kg/m2  SpO2 98% General appearance: alert, cooperative and no distress Head: Normocephalic, without obvious abnormality, atraumatic Neck: no adenopathy, no carotid bruit, no JVD, supple, symmetrical, trachea midline and thyroid not enlarged, symmetric, no  tenderness/mass/nodules Resp: diminished breath sounds bilaterally and poor air movement Cardio: regular rate and rhythm and metallic click GI: distended, +BS, soft, NT, PD catheter in place without evidence of erythema or drainage Extremities: extremities normal, atraumatic, no cyanosis or edema and L forearm AVF +T/B and IV in that arm Dialysis Access:  Dialysis Orders: CCPD 5 exchanges dwell time 1:17, fill volume 2700, drain 8min, fill 58min, last fill 2L until noon when he does a manual exchange of 2L and dwells for 4 hours until he resumes CCPD at 6pm.  All 2.5% D Assessment/Plan: 1.  LLL HCAP- on antibiotics per primary svc 2. SSCP- r/o for MI 3.  ESRD -  Cont with CCPD with his outpt prescription 4.  Hypertension/volume  - stabl3 5.  Anemia  - stable 6.  Metabolic bone disease -  Cont with renvela 7.  Nutrition - stable  8. AVF- cont with coumadin 9. COPD- O2 via [place] and stress smoking cessation.   10. Tobacco abuse- interested in nicotine patch.  Plan per primary svc 11. Vascular access- He has a functioning L forearm AVF and unfortunately they placed an IV in the left arm.  Discussed with the housestaff and will need to d/c this IV and place in another location vs temp central line.  Donetta Potts, MD [facility] [phone]  10/26/2013, 10:22 PM

## 2013-10-26 NOTE — Consult Note (Signed)
Patient ID: Zachary [name] MRN: [id number], DOB/AGE: 24-Aug-1961   Admit date: 10/26/2013   Primary Physician: Dr. Jerilee Field Primary Cardiologist: Dr. Duke Salvia - [facility]/ Thoracic surgery - Dr. Lunette Stands [facility]  Pt. Profile:  53 year old male with history of aortic valve disease and end-stage renal disease who is status post mechanical aVR and uses peritoneal dialysis at home, who presented to the ED tonight with a one to two-week history of intermittent sharp chest pain.  Problem List  Past Medical History  Diagnosis Date  . ESRD (end stage renal disease)     a. Dx ~ 2008, HD x 2 yrs then PD since.  . Hypertension   . Anxiety   . Chest pain     a. 2011 cath: Normal coronary arteries;  b. 04/2013 Cath: nonobs dzs - [facility]  . Hyperlipidemia   . TIA (transient ischemic attack)   . Tobacco abuse     a. ongoing (10/2013)  . Adrenal insufficiency   . Venous thrombosis   . Chronic pain syndrome   . Staghorn calculus     a. s/p bilateral nephrectomy  . Aortic insufficiency     a. 2011 s/p bioprosthetic AVR @ [facility] with subsequent failure and redo AVR with mechanical valve (SJM 31mm) 01/2012->chronic coumadin;  b. 04/2013 TEE and Echo ([facility]): No LA clot, nl fxning AVR, EF 35-40%, glob HK, mod dil LA, mild AI, mod TR, RV syst dysfxn.  . Depression     Past Surgical History  Procedure Laterality Date  . Nephrectomy      left secondary to staghorn calculus and chronic pyelonephritis     Allergies  Allergies  Allergen Reactions  . Codeine     HPI  53 year old male with the above complex problem list. He has a history of severe nephrolithiasis status post bilateral nephrectomy with subsequent requirement for hemodialysis initiated approximately 7 years ago and later peritoneal dialysis, which he has been doing by himself for the past 5 years. He also has a history of aortic insufficiency and stenosis and is status post bioprosthetic aortic valve replacement 2011.  Unfortunately, he developed severe prosthetic valve aortic stenosis and in April of 2013, underwent repeat aortic valve replacement with a mechanical valve. He has been on Coumadin since that time and his levels are followed by his primary care provider in [place], [place]. He continues to followup with cardiology in [place]. In July of 2014, patient was admitted with chest pain and had an abnormal stress test. This led to diagnostic catheterization which per report in care everywhere, showed nonobstructive CAD. He has since been medically managed.  Patient lives in [place], [place] by himself and continues to smoke cigarettes. Approximately 3 weeks ago, he developed cough and congestion and was placed on antibiotic therapy by his primary care provider. Approximately one week ago, he began to experience intermittent rest and exertional sharp left-sided chest pain radiating to his left arm and hand, associated with hand numbness and occasional dyspnea, lasting 2 or 3 minutes, and resolving spontaneously. Symptoms would occur multiple times per day. Approximately 3 days ago, he also began to experience dyspnea exertion. Chest pain recurred this morning prompting him to call EMS. ECG performed by EMS showed sinus tachycardia with some lateral ST depression. There was also concern for ST elevation. A code STEMI was called and the patient was taken to the [facility] for further evaluation. Here, he continues to complain of intermittent chest pain. Repeat ECG does not show  significant ST elevation and following review of his previous records, code STEMI has been called off.  Home Medications  Prior to Admission medications   Medication Sig Start Date End Date Taking? Authorizing Provider  aspirin 81 MG tablet Take 81 mg by mouth daily.    Historical Provider, MD  B Complex-C-Biotin-E-Min-FA (DIALYVITE 3000 PO) Take by mouth as directed.    Historical Provider, MD  cinacalcet (SENSIPAR) 30 MG tablet  Take 30 mg by mouth daily.    Historical Provider, MD  docusate sodium (COLACE) 100 MG capsule Take 100 mg by mouth 2 (two) times daily.    Historical Provider, MD  hydrALAZINE (APRESOLINE) 25 MG tablet Take 25 mg by mouth 4 (four) times daily.    Historical Provider, MD  hydrALAZINE (APRESOLINE) 50 MG tablet Take 50 mg by mouth as needed. If b/p is greater than 160    Historical Provider, MD  hydrocortisone (CORTEF) 20 MG tablet Take 20 mg by mouth daily.    Historical Provider, MD  LORazepam (ATIVAN) 1 MG tablet Take 1 mg by mouth every 8 (eight) hours as needed.    Historical Provider, MD  metoprolol succinate (TOPROL-XL) 50 MG 24 hr tablet Take 50 mg by mouth daily. Take with or immediately following a meal.    Historical Provider, MD  NIFEdipine (ADALAT CC) 90 MG 24 hr tablet Take 90 mg by mouth daily.    Historical Provider, MD  nitroGLYCERIN (NITROSTAT) 0.4 MG SL tablet Place 0.4 mg under the tongue every 5 (five) minutes as needed.    Historical Provider, MD  oxyCODONE-acetaminophen (PERCOCET) 10-325 MG per tablet Take 1 tablet by mouth every 6 (six) hours as needed.    Historical Provider, MD  sertraline (ZOLOFT) 50 MG tablet Take 50 mg by mouth daily.    Historical Provider, MD  sevelamer (RENVELA) 800 MG tablet Take 800 mg by mouth 3 (three) times daily with meals.    Historical Provider, MD   Family History  Family History  Problem Relation Age of Onset  . Hypertension    . Hyperlipidemia    . Coronary artery disease     Social History  History   Social History  . Marital Status: Single    Spouse Name: N/A    Number of Children: N/A  . Years of Education: N/A   Occupational History  . Not on file.   Social History Main Topics  . Smoking status: Current Every Day Smoker -- 1.00 packs/day for 30 years  . Smokeless tobacco: Not on file     Comment: currently smoking 1/2 ppd.  . Alcohol Use: No  . Drug Use: No  . Sexual Activity: Not Currently   Other Topics Concern    . Not on file   Social History Narrative   Lives in [place] by himself.    Review of Systems General:  occas chills, no fever, night sweats or weight changes.  Cardiovascular:  +++ chest pain, +++ dyspnea on exertion, no edema, orthopnea, palpitations, paroxysmal nocturnal dyspnea.  He thinks that he coughed up some blood yesterday. Dermatological: No rash, lesions/masses Respiratory: +++ prod cough, +++ dyspnea Urologic: No hematuria, dysuria Abdominal:   Says that he has intermittent bloody streaking of stools.  Not sure if he has a h/o hemorrhoids.  No nausea, vomiting, diarrhea, bright red blood per rectum, melena, or hematemesis Neurologic:  No visual changes, wkns, changes in mental status. All other systems reviewed and are otherwise negative except as noted above.  Physical Exam  Blood pressure 110/70, pulse 125, resp. rate 17, SpO2 97.00%.  General: Pleasant, NAD.  Poor dentition. Psych: Normal affect. Neuro: Alert and oriented X 3. Moves all extremities spontaneously. HEENT: Normal  Neck: Supple without bruits or JVD. Lungs:  Resp regular and unlabored, scatt rhonchi and wheezing throughout.Marland Kitchen Heart: RRR no s3, s4, or murmurs. Abdomen: Soft, non-tender, non-distended, BS + x 4.  Extremities: No clubbing, cyanosis or edema. DP/PT/Radials 2+ and equal bilaterally.  Labs  Troponin [facility] of Care Test)  Recent Labs  10/26/13 1754  TROPIPOC 0.04   Lab Results  Component Value Date   WBC 16.9* 10/26/2013   HGB 11.8* 10/26/2013   HCT 37.2* 10/26/2013   MCV 104.2* 10/26/2013   PLT 193 10/26/2013   Lab Results  Component Value Date   CREATININE 12.81* 10/26/2013   BUN 52* 10/26/2013   NA 140 10/26/2013   K 4.2 10/26/2013   CL 93* 10/26/2013   CO2 27 10/26/2013      Radiology/Studies  IMPRESSION: There is a degree of underlying emphysematous change. There is a small area of patchy infiltrate in the left base. Elsewhere, lungs are clear. Heart mildly enlarged but  stable.  ECG  Sinus tach, inflat st dep.  ASSESSMENT AND PLAN  1.  Midsternal Chest Pain:  Pt presents with a 1-2 wk h/o intermittent sharp c/p.  He is tachycardic on ECG with lat st changes, which may be rate related in the setting of LVH.  He had nonobs dzs by cath in July @ [facility], thus doubt ischemia.  Initial troponin is normal despite frequent Ss over the past 1-2 wks.  Cycle ce.  Cont home meds.  If ce remain neg, would not pursue further ischemic eval during this admission and instead refer him back to his primary cardiologist @ [facility].  With leukocytosis, tachycardia, and left base infiltrate with hemoptysis, rec Internal Medicine admission.  We will be happy to follow along.  2.  ESRD:  On PD.   3.  HTN:  Stable.  Follow.  4.  AI/AS:  S/p AVR.  Cont coumadin.  5.  Anemia:  With h/o blood in stools and ? Hemoptysis yesterday.  CXR with poss pna. Guaiac stools.  6.  Tob Abuse:  Cessation advised.  Signed, Murray Hodgkins, NP 10/26/2013, 7:20 PM  I have examined the patient and reviewed assessment and plan and discussed with patient.  Agree with above as stated.   Negative troponin despite hours of chest pain.  We reviewed the [facility] records and found in a discharge summary that he had a clean cath about 6 months ago. The STEMI that was called by EMS was canceled. It appears now he has pneumonia. Would admit to the medical team. We will consult as needed. If he rules out for MI, would not plan for any further ischemia workup given the recent cardiac catheterization.  VARANASI,JAYADEEP S.

## 2013-10-26 NOTE — ED Notes (Signed)
Family states pt is a DNR

## 2013-10-26 NOTE — ED Notes (Signed)
meds ordered for nausea, pt dry heaving

## 2013-10-26 NOTE — ED Notes (Signed)
Renal MD at bedside.

## 2013-10-26 NOTE — ED Provider Notes (Signed)
CSN: [id number]     Arrival date & time 10/26/13  1722 History   First MD Initiated Contact with Patient 10/26/13 1745     Chief Complaint  Patient presents with  . Chest Pain   (Consider location/radiation/quality/duration/timing/severity/associated sxs/prior Treatment) Patient is a 53 y.o. male presenting with chest pain. The history is provided by the patient and the EMS personnel.  Chest Pain Associated symptoms: back pain, fatigue, shortness of breath and weakness   Associated symptoms: no abdominal pain, no fever, no nausea and not vomiting    patient brought in by EMS as a code STEMI. For ST segment elevation anteriorly. Patient with complaint of chest pain 7/10 sometimes 10 out of 10 for the past several days. Sniffily worse today. In addition he's had a cough and fatigue for multiple days. 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 Patient's followed up in the ED area for all of his medical care including the dialysis.  Past Medical History  Diagnosis Date  . ESRD (end stage renal disease)     a. Dx ~ 2008, HD x 2 yrs then PD since.  . Hypertension   . Anxiety   . Chest pain     a. 2011 cath: Normal coronary arteries;  b. 04/2013 Cath: nonobs dzs - [facility]  . Hyperlipidemia   . TIA (transient ischemic attack)   . Tobacco abuse     a. ongoing (10/2013)  . Adrenal insufficiency   . Venous thrombosis   . Chronic pain syndrome   . Staghorn calculus     a. s/p bilateral nephrectomy  . Aortic insufficiency     a. 2011 s/p bioprosthetic AVR @ [facility] with subsequent failure and redo AVR with mechanical valve (SJM 78mm) 01/2012->chronic coumadin;  b. 04/2013 TEE and Echo ([facility]): No LA clot, nl fxning AVR, EF 35-40%, glob HK, mod dil LA, mild AI, mod TR, RV syst dysfxn.  . Depression    Past Surgical History  Procedure Laterality Date  . Nephrectomy      left secondary to staghorn calculus and  chronic pyelonephritis   Family History  Problem Relation Age of Onset  . Hypertension    . Hyperlipidemia    . Coronary artery disease     History  Substance Use Topics  . Smoking status: Current Every Day Smoker -- 1.00 packs/day for 30 years  . Smokeless tobacco: Not on file     Comment: currently smoking 1/2 ppd.  . Alcohol Use: No    Review of Systems  Constitutional: Positive for fatigue. Negative for fever.  HENT: Positive for congestion.   Eyes: Negative for visual disturbance.  Respiratory: Positive for shortness of breath.   Cardiovascular: Positive for chest pain.  Gastrointestinal: Negative for nausea, vomiting and abdominal pain.  Genitourinary: Negative for flank pain.  Musculoskeletal: Positive for back pain.  Skin: Negative for rash.  Neurological: Positive for weakness.  Hematological: Does not bruise/bleed easily.  Psychiatric/Behavioral: Negative for confusion.    Allergies  Codeine  Home Medications   Current Outpatient Rx  Name  Route  Sig  Dispense  Refill  . aspirin 81 MG tablet   Oral   Take 81 mg by mouth daily.         . B Complex-C-Biotin-E-Min-FA (DIALYVITE 3000 PO)   Oral   Take by mouth as directed.         . cinacalcet (SENSIPAR) 30 MG  tablet   Oral   Take 30 mg by mouth daily.         Marland Kitchen docusate sodium (COLACE) 100 MG capsule   Oral   Take 100 mg by mouth 2 (two) times daily.         . hydrALAZINE (APRESOLINE) 25 MG tablet   Oral   Take 25 mg by mouth 4 (four) times daily.         . hydrALAZINE (APRESOLINE) 50 MG tablet   Oral   Take 50 mg by mouth as needed. If b/p is greater than 160         . hydrocortisone (CORTEF) 20 MG tablet   Oral   Take 20 mg by mouth daily.         Marland Kitchen LORazepam (ATIVAN) 1 MG tablet   Oral   Take 1 mg by mouth every 8 (eight) hours as needed.         . metoprolol succinate (TOPROL-XL) 50 MG 24 hr tablet   Oral   Take 50 mg by mouth daily. Take with or immediately following  a meal.         . NIFEdipine (ADALAT CC) 90 MG 24 hr tablet   Oral   Take 90 mg by mouth daily.         . nitroGLYCERIN (NITROSTAT) 0.4 MG SL tablet   Sublingual   Place 0.4 mg under the tongue every 5 (five) minutes as needed.         Marland Kitchen oxyCODONE-acetaminophen (PERCOCET) 10-325 MG per tablet   Oral   Take 1 tablet by mouth every 6 (six) hours as needed.         . sertraline (ZOLOFT) 50 MG tablet   Oral   Take 50 mg by mouth daily.         . sevelamer (RENVELA) 800 MG tablet   Oral   Take 800 mg by mouth 3 (three) times daily with meals.          BP 112/81  Pulse 124  Resp 16  Ht 5\' 11"  (1.803 m)  Wt 185 lb (83.915 kg)  BMI 25.81 kg/m2  SpO2 100% Physical Exam  Nursing note and vitals reviewed. Constitutional: He is oriented to person, place, and time. He appears well-developed and well-nourished. He appears distressed.  HENT:  Head: Normocephalic and atraumatic.  Mouth/Throat: Oropharynx is clear and moist.  Eyes: Conjunctivae and EOM are normal. Pupils are equal, round, and reactive to light.  Neck: Normal range of motion.  Cardiovascular: Normal heart sounds.   Tachycardic  Click present.  Pulmonary/Chest: Effort normal and breath sounds normal. He has no wheezes.  Abdominal: Soft. Bowel sounds are normal. There is no tenderness.  Peritoneal dialysis catheter in place.  Musculoskeletal: Normal range of motion.  Old AV fistula left arm has not been using 2 years.  Neurological: He is alert and oriented to person, place, and time. No cranial nerve deficit. He exhibits normal muscle tone. Coordination normal.  Skin: Skin is warm. No rash noted.    ED Course  Procedures (including critical care time) Labs Review Labs Reviewed  CBC WITH DIFFERENTIAL - Abnormal; Notable for the following:    WBC 16.9 (*)    RBC 3.57 (*)    Hemoglobin 11.8 (*)    HCT 37.2 (*)    MCV 104.2 (*)    RDW 17.4 (*)    Neutrophils Relative % 89 (*)    Neutro Abs 15.0 (*)  Lymphocytes Relative 3 (*)    Lymphs Abs 0.5 (*)    Monocytes Absolute 1.2 (*)    All other components within normal limits  BASIC METABOLIC PANEL - Abnormal; Notable for the following:    Chloride 93 (*)    Glucose, Bld 129 (*)    BUN 52 (*)    Creatinine, Ser 12.81 (*)    GFR calc non Af Amer 4 (*)    GFR calc Af Amer 5 (*)    All other components within normal limits  CULTURE, BLOOD (ROUTINE X 2)  CULTURE, BLOOD (ROUTINE X 2)  TROPONIN I  POCT I-STAT TROPONIN I  CG4 I-STAT (LACTIC ACID)   Results for orders placed during the hospital encounter of 10/26/13  CBC WITH DIFFERENTIAL      Result Value Range   WBC 16.9 (*) 4.0 - 10.5 K/uL   RBC 3.57 (*) 4.22 - 5.81 MIL/uL   Hemoglobin 11.8 (*) 13.0 - 17.0 g/dL   HCT 37.2 (*) 39.0 - 52.0 %   MCV 104.2 (*) 78.0 - 100.0 fL   MCH 33.1  26.0 - 34.0 pg   MCHC 31.7  30.0 - 36.0 g/dL   RDW 17.4 (*) 11.5 - 15.5 %   Platelets 193  150 - 400 K/uL   Neutrophils Relative % 89 (*) 43 - 77 %   Neutro Abs 15.0 (*) 1.7 - 7.7 K/uL   Lymphocytes Relative 3 (*) 12 - 46 %   Lymphs Abs 0.5 (*) 0.7 - 4.0 K/uL   Monocytes Relative 7  3 - 12 %   Monocytes Absolute 1.2 (*) 0.1 - 1.0 K/uL   Eosinophils Relative 1  0 - 5 %   Eosinophils Absolute 0.1  0.0 - 0.7 K/uL   Basophils Relative 0  0 - 1 %   Basophils Absolute 0.1  0.0 - 0.1 K/uL  BASIC METABOLIC PANEL      Result Value Range   Sodium 140  137 - 147 mEq/L   Potassium 4.2  3.7 - 5.3 mEq/L   Chloride 93 (*) 96 - 112 mEq/L   CO2 27  19 - 32 mEq/L   Glucose, Bld 129 (*) 70 - 99 mg/dL   BUN 52 (*) 6 - 23 mg/dL   Creatinine, Ser 12.81 (*) 0.50 - 1.35 mg/dL   Calcium 8.5  8.4 - 10.5 mg/dL   GFR calc non Af Amer 4 (*) >90 mL/min   GFR calc Af Amer 5 (*) >90 mL/min  TROPONIN I      Result Value Range   Troponin I <0.30  <0.30 ng/mL  POCT I-STAT TROPONIN I      Result Value Range   Troponin i, poc 0.04  0.00 - 0.08 ng/mL   Comment 3           CG4 I-STAT (LACTIC ACID)      Result Value  Range   Lactic Acid, Venous 1.77  0.5 - 2.2 mmol/L    Imaging Review Dg Chest Port 1 View  10/26/2013   CLINICAL DATA:  Chest pain and hypertension  EXAM: PORTABLE CHEST - 1 VIEW  COMPARISON:  August 01, 2013  FINDINGS: There is underlying emphysematous change. There is a small area of patchy infiltrate in the left base. Lungs elsewhere are clear. Heart is mildly enlarged. The pulmonary vascularity is within normal limits. No adenopathy. Patient is status post aortic valve replacement. No pneumothorax. No bone lesions.  IMPRESSION: There is a degree of underlying  emphysematous change. There is a small area of patchy infiltrate in the left base. Elsewhere, lungs are clear. Heart mildly enlarged but stable.   Electronically Signed   By: Lowella Grip M.D.   On: 10/26/2013 18:12    EKG Interpretation    Date/Time:  Wednesday October 26 2013 17:30:24 EST Ventricular Rate:  124 PR Interval:  148 QRS Duration: 116 QT Interval:  338 QTC Calculation: 485 R Axis:   111 Text Interpretation:  Age not entered, assumed to be  53 years old for purpose of ECG interpretation Sinus tachycardia Nonspecific intraventricular conduction delay Abnormal T, consider ischemia, diffuse leads ST elevation, consider anterolateral injury Changes noted Confirmed by Simonne Boulos  MD, Delta Pichon ([id number]) on 10/26/2013 6:43:08 PM           CRITICAL CARE Performed by: Mervin Kung. Total critical care time: 30 Critical care time was exclusive of separately billable procedures and treating other patients. Critical care was necessary to treat or prevent imminent or life-threatening deterioration. Critical care was time spent personally by me on the following activities: development of treatment plan with patient and/or surrogate as well as nursing, discussions with consultants, evaluation of patient's response to treatment, examination of patient, obtaining history from patient or surrogate, ordering and performing  treatments and interventions, ordering and review of laboratory studies, ordering and review of radiographic studies, pulse oximetry and re-evaluation of patient's condition.   MDM   1. HCAP (healthcare-associated pneumonia)   2. Chest pain   3. History of peritoneal dialysis   4. Renal failure    Patient arrived by EMS as a code STEMI for presumed to acute MI. Patient with chest pain for about a week but got significantly worse today. Patient to has a mechanical valve the valve. Patient is also. A dialysis patient. Patient's also had cough and fatigue for multiple days. Workup here was initially as a code STEMI repeat EKG titration by me and [facility] cardiology eventually determined that not an acute STEMI. Patient's chest pain now resolved with 2 mg of morphine. Chest x-ray shows a pneumonia he has an elevated white count at 16,000 most likely main complaint is due to this left-sided pneumonia. Patient treated for the pneumonia as if his healthcare acquired pneumonia due to his peroneal dialysis. Started on protocol antibiotics after cultures. Patient with a persistent tachycardia that is baseline for him. Patient's blood pressure normally is no greater than [id number] systolic. Patient's lactic acid is not elevated. Patient's had 2 troponins that have been negative so MRI is extremely unlikely. Patient will require admission for the pneumonia. Patient will require consultation by nephrology for dialysis. Discussed with hospitalist they will come see and admit. Despite the history of the mechanical heart valve which is questionable me to set up a valve. Patient is not on any blood thinners.    Mervin Kung, MD 10/26/13 2129

## 2013-10-26 NOTE — Progress Notes (Signed)
Nursing staff requested chaplain to come to ED to assist family and pt with anxiety. Chaplain provided prayer, comfort and a listening ear to family and pt.      Charyl Dancer, Chaplain

## 2013-10-26 NOTE — ED Notes (Signed)
Attempted report 

## 2013-10-26 NOTE — Progress Notes (Signed)
ANTIBIOTIC CONSULT NOTE - INITIAL  Pharmacy Consult:  Vancomycin Indication:  Suspected PNA  Allergies  Allergen Reactions  . Codeine     Patient Measurements: Height: 5\' 11"  (180.3 cm) Weight: 185 lb (83.915 kg) IBW/kg (Calculated) : 75.3  Vital Signs: BP: 110/70 mmHg (01/07 1830) Pulse Rate: 125 (01/07 1830)  Labs:  Recent Labs  10/26/13 1745  WBC 16.9*  HGB 11.8*  PLT 193  CREATININE 12.81*   Estimated Creatinine Clearance: 7.3 ml/min (by C-G formula based on Cr of 12.81). No results found for this basename: VANCOTROUGH, VANCOPEAK, VANCORANDOM, GENTTROUGH, GENTPEAK, GENTRANDOM, TOBRATROUGH, TOBRAPEAK, TOBRARND, AMIKACINPEAK, AMIKACINTROU, AMIKACIN,  in the last 72 hours   Microbiology: No results found for this or any previous visit (from the past 720 hour(s)).  Medical History: Past Medical History  Diagnosis Date  . ESRD (end stage renal disease)     a. Dx ~ 2008, HD x 2 yrs then PD since.  . Hypertension   . Anxiety   . Chest pain     a. 2011 cath: Normal coronary arteries;  b. 04/2013 Cath: nonobs dzs - [facility]  . Hyperlipidemia   . TIA (transient ischemic attack)   . Tobacco abuse     a. ongoing (10/2013)  . Adrenal insufficiency   . Venous thrombosis   . Chronic pain syndrome   . Staghorn calculus     a. s/p bilateral nephrectomy  . Aortic insufficiency     a. 2011 s/p bioprosthetic AVR @ [facility] with subsequent failure and redo AVR with mechanical valve (SJM 108mm) 01/2012->chronic coumadin;  b. 04/2013 TEE and Echo ([facility]): No LA clot, nl fxning AVR, EF 35-40%, glob HK, mod dil LA, mild AI, mod TR, RV syst dysfxn.  . Depression       Assessment: 3 YOM admitted as a Code STEMI, which was canceled.  Now to start vancomycin for suspected PNA.  Patient reports he is on CCPD and it runs for 10 hours per night.  His last complete session was yesterday.  Noted Cefepime 1gm IV x 1 is ordered.   Goal of Therapy:  Vancomycin trough level 15-20  mcg/ml   Plan:  - Vanc 1750mg  IV x 1 - F/U renal plans, UOP, and vanc random level in 3-4 days to determine when to redose - F/U continuation of Gram negative coverage - F/U resume Coumadin as appropriate    Salvatore Shear D. Mina Marble, PharmD, BCPS Pager:  [phone] 10/26/2013, 7:53 PM

## 2013-10-26 NOTE — ED Notes (Signed)
Pt complaing of chest pain 7/10. Pt has hx of cough and fatigue for multiple days. Pt is restricted for both arms. No peripheral access upon arrival.  According to EMS 12 lead showed ST elevation and Code Stemi was called.  Pt has pig valve/mechanical valve, Renal failure, CABG x 2, DM, [facility]- 215 Allergic to Codeine  PT was given 324 ASA and nitro x 2 when pain was 9/10. Pt dropped to 7/10.

## 2013-10-27 ENCOUNTER — Encounter (HOSPITAL_COMMUNITY): Payer: Self-pay | Admitting: *Deleted

## 2013-10-27 ENCOUNTER — Inpatient Hospital Stay (HOSPITAL_COMMUNITY): Payer: Medicare Other

## 2013-10-27 DIAGNOSIS — Z9889 Other specified postprocedural states: Secondary | ICD-10-CM | POA: Diagnosis not present

## 2013-10-27 DIAGNOSIS — R079 Chest pain, unspecified: Secondary | ICD-10-CM | POA: Diagnosis not present

## 2013-10-27 DIAGNOSIS — J189 Pneumonia, unspecified organism: Secondary | ICD-10-CM | POA: Diagnosis not present

## 2013-10-27 DIAGNOSIS — R0609 Other forms of dyspnea: Secondary | ICD-10-CM | POA: Diagnosis not present

## 2013-10-27 DIAGNOSIS — N186 End stage renal disease: Secondary | ICD-10-CM | POA: Diagnosis not present

## 2013-10-27 LAB — COMPREHENSIVE METABOLIC PANEL
ALBUMIN: 3 g/dL — AB (ref 3.5–5.2)
ALK PHOS: 48 U/L (ref 39–117)
ALT: 9 U/L (ref 0–53)
AST: 10 U/L (ref 0–37)
BILIRUBIN TOTAL: 0.3 mg/dL (ref 0.3–1.2)
BUN: 60 mg/dL — AB (ref 6–23)
CHLORIDE: 97 meq/L (ref 96–112)
CO2: 26 mEq/L (ref 19–32)
CREATININE: 12.82 mg/dL — AB (ref 0.50–1.35)
Calcium: 8 mg/dL — ABNORMAL LOW (ref 8.4–10.5)
GFR, EST AFRICAN AMERICAN: 5 mL/min — AB (ref >90)
GFR, EST NON AFRICAN AMERICAN: 4 mL/min — AB (ref >90)
GLUCOSE: 129 mg/dL — AB (ref 70–99)
Potassium: 4.3 mEq/L (ref 3.7–5.3)
Sodium: 144 mEq/L (ref 137–147)
Total Protein: 6.1 g/dL (ref 6.0–8.3)

## 2013-10-27 LAB — CBC WITH DIFFERENTIAL/PLATELET
Basophils Absolute: 0.1 10*3/uL (ref 0.0–0.1)
Basophils Relative: 0 % (ref 0–1)
Eosinophils Absolute: 0.1 10*3/uL (ref 0.0–0.7)
Eosinophils Relative: 0 % (ref 0–5)
HCT: 33.6 % — ABNORMAL LOW (ref 39.0–52.0)
HEMOGLOBIN: 10.4 g/dL — AB (ref 13.0–17.0)
LYMPHS ABS: 1.1 10*3/uL (ref 0.7–4.0)
Lymphocytes Relative: 7 % — ABNORMAL LOW (ref 12–46)
MCH: 32.3 pg (ref 26.0–34.0)
MCHC: 31 g/dL (ref 30.0–36.0)
MCV: 104.3 fL — ABNORMAL HIGH (ref 78.0–100.0)
MONOS PCT: 8 % (ref 3–12)
Monocytes Absolute: 1.3 10*3/uL — ABNORMAL HIGH (ref 0.1–1.0)
NEUTROS ABS: 12.7 10*3/uL — AB (ref 1.7–7.7)
Neutrophils Relative %: 84 % — ABNORMAL HIGH (ref 43–77)
Platelets: 161 10*3/uL (ref 150–400)
RBC: 3.22 MIL/uL — AB (ref 4.22–5.81)
RDW: 17.3 % — ABNORMAL HIGH (ref 11.5–15.5)
WBC: 15.2 10*3/uL — ABNORMAL HIGH (ref 4.0–10.5)

## 2013-10-27 LAB — BODY FLUID CELL COUNT WITH DIFFERENTIAL
Eos, Fluid: 0 %
LYMPHS FL: 0 %
MONOCYTE-MACROPHAGE-SEROUS FLUID: 29 % — AB (ref 50–90)
Neutrophil Count, Fluid: 71 % — ABNORMAL HIGH (ref 0–25)
WBC FLUID: 37595 uL — AB (ref 0–1000)

## 2013-10-27 LAB — PROTIME-INR
INR: 4.49 — ABNORMAL HIGH (ref 0.00–1.49)
Prothrombin Time: 40.9 seconds — ABNORMAL HIGH (ref 11.6–15.2)

## 2013-10-27 LAB — HIV ANTIBODY (ROUTINE TESTING W REFLEX): HIV: NONREACTIVE

## 2013-10-27 LAB — PROCALCITONIN: Procalcitonin: 2.09 ng/mL

## 2013-10-27 LAB — IRON AND TIBC
Iron: 20 ug/dL — ABNORMAL LOW (ref 42–135)
SATURATION RATIOS: 11 % — AB (ref 20–55)
TIBC: 176 ug/dL — ABNORMAL LOW (ref 215–435)
UIBC: 156 ug/dL (ref 125–400)

## 2013-10-27 LAB — PHOSPHORUS: PHOSPHORUS: 8.2 mg/dL — AB (ref 2.3–4.6)

## 2013-10-27 LAB — TROPONIN I: Troponin I: <0.3 ng/mL (ref <0.30)

## 2013-10-27 LAB — FOLATE: Folate: 6 ng/mL

## 2013-10-27 LAB — LACTIC ACID, PLASMA: LACTIC ACID, VENOUS: 1.1 mmol/L (ref 0.5–2.2)

## 2013-10-27 LAB — RETICULOCYTES
RBC.: 3.03 MIL/uL — ABNORMAL LOW (ref 4.22–5.81)
RETIC CT PCT: 4.7 % — AB (ref 0.4–3.1)
Retic Count, Absolute: 142.4 10*3/uL (ref 19.0–186.0)

## 2013-10-27 LAB — PATHOLOGIST SMEAR REVIEW

## 2013-10-27 LAB — VITAMIN B12: Vitamin B-12: 309 pg/mL (ref 211–911)

## 2013-10-27 LAB — FERRITIN: FERRITIN: 655 ng/mL — AB (ref 22–322)

## 2013-10-27 MED ORDER — ALBUTEROL SULFATE HFA 108 (90 BASE) MCG/ACT IN AERS
2.0000 | INHALATION_SPRAY | RESPIRATORY_TRACT | Status: DC | PRN
  Filled 2013-10-27: qty 6.7

## 2013-10-27 MED ORDER — DELFLEX-LC/1.5% DEXTROSE 346 MOSM/L IP SOLN
INTRAPERITONEAL | Status: DC
  Administered 2013-10-27 – 2013-10-28 (×4): via INTRAPERITONEAL

## 2013-10-27 MED ORDER — HYDRALAZINE HCL 25 MG PO TABS
25.0000 mg | ORAL_TABLET | Freq: Three times a day (TID) | ORAL | Status: DC
  Administered 2013-10-27: 25 mg via ORAL
  Filled 2013-10-27 (×5): qty 1

## 2013-10-27 MED ORDER — LORAZEPAM 1 MG PO TABS
1.0000 mg | ORAL_TABLET | Freq: Two times a day (BID) | ORAL | Status: DC
  Administered 2013-10-27 – 2013-10-28 (×3): 1 mg via ORAL
  Filled 2013-10-27 (×3): qty 1

## 2013-10-27 MED ORDER — HEPARIN SODIUM (PORCINE) 1000 UNIT/ML IJ SOLN
500.0000 [IU] | INTRAMUSCULAR | Status: DC
  Filled 2013-10-27: qty 0.5

## 2013-10-27 MED ORDER — METOPROLOL SUCCINATE ER 50 MG PO TB24
50.0000 mg | ORAL_TABLET | Freq: Every day | ORAL | Status: DC
  Administered 2013-10-28: 50 mg via ORAL
  Filled 2013-10-27: qty 1

## 2013-10-27 MED ORDER — NICOTINE POLACRILEX 2 MG MT GUM
2.0000 mg | CHEWING_GUM | OROMUCOSAL | Status: DC | PRN
  Filled 2013-10-27: qty 1

## 2013-10-27 MED ORDER — WARFARIN - PHARMACIST DOSING INPATIENT: Freq: Every day | Status: DC

## 2013-10-27 MED ORDER — DEXTROSE 5 % IV SOLN
1.0000 g | INTRAVENOUS | Status: DC
  Administered 2013-10-27: 1 g via INTRAVENOUS
  Filled 2013-10-27 (×2): qty 1

## 2013-10-27 MED ORDER — SODIUM CHLORIDE 0.9 % IV BOLUS (SEPSIS)
500.0000 mL | Freq: Once | INTRAVENOUS | Status: AC
  Administered 2013-10-27: 500 mL via INTRAVENOUS

## 2013-10-27 MED ORDER — PREDNISONE 50 MG PO TABS
60.0000 mg | ORAL_TABLET | Freq: Every day | ORAL | Status: DC
Start: 2013-10-27 — End: 2013-10-28
  Administered 2013-10-27 – 2013-10-28 (×2): 60 mg via ORAL
  Filled 2013-10-27 (×3): qty 1

## 2013-10-27 MED ORDER — CALCIUM ACETATE 667 MG PO CAPS
667.0000 mg | ORAL_CAPSULE | Freq: Two times a day (BID) | ORAL | Status: DC
  Administered 2013-10-27 – 2013-10-28 (×2): 667 mg via ORAL
  Filled 2013-10-27 (×4): qty 1

## 2013-10-27 MED ORDER — GABAPENTIN 100 MG PO CAPS
100.0000 mg | ORAL_CAPSULE | Freq: Three times a day (TID) | ORAL | Status: DC
  Administered 2013-10-27 – 2013-10-28 (×3): 100 mg via ORAL
  Filled 2013-10-27 (×5): qty 1

## 2013-10-27 MED ORDER — DELFLEX-LM/1.5% DEXTROSE 346 MOSM/L IP SOLN: INTRAPERITONEAL | Status: DC

## 2013-10-27 MED ORDER — HEPARIN SODIUM (PORCINE) 1000 UNIT/ML IJ SOLN
1500.0000 [IU] | INTRAMUSCULAR | Status: DC
  Administered 2013-10-27 – 2013-10-28 (×5): 1500 [IU] via INTRAPERITONEAL
  Filled 2013-10-27 (×4): qty 1.5

## 2013-10-27 MED ORDER — PREDNISONE 10 MG PO TABS: 10.0000 mg | ORAL_TABLET | Freq: Two times a day (BID) | ORAL | Status: DC

## 2013-10-27 NOTE — Progress Notes (Signed)
Subjective: Still with Rt sided chest pain that is exacerbated by deep breathing and reproducible with palpation. Denies cough fever or chills.   Objective: Vital signs in last 24 hours: Temp:  [98.1 F (36.7 C)-98.6 F (37 C)] 98.1 F (36.7 C) (01/08 1000) Pulse Rate:  [90-126] 90 (01/08 1000) Resp:  [14-23] 18 (01/08 1000) BP: (63-125)/(27-81) 93/55 mmHg (01/08 1000) SpO2:  [93 %-100 %] 97 % (01/08 1000) Weight:  [185 lb (83.915 kg)-189 lb 9.6 oz (86.002 kg)] 189 lb 9.6 oz (86.002 kg) (01/07 2321)    Intake/Output from previous day: 01/07 0701 - 01/08 0700 In: 240 [P.O.:240] Out: -  Intake/Output this shift: Total I/O In: 220 [P.O.:220] Out: -   Medications Current Facility-Administered Medications  Medication Dose Route Frequency Provider Last Rate Last Dose  . albuterol (PROVENTIL HFA;VENTOLIN HFA) 108 (90 BASE) MCG/ACT inhaler 2 puff  2 puff Inhalation Q4H PRN Nita Sells, MD      . aspirin tablet 325 mg  325 mg Oral Daily Mervin Kung, MD   325 mg at 10/27/13 1044  . calcium acetate (PHOSLO) capsule 667 mg  667 mg Oral BID WC Nita Sells, MD      . ceFEPIme (MAXIPIME) 1 g in dextrose 5 % 50 mL IVPB  1 g Intravenous Q24H Nita Sells, MD      . cinacalcet (SENSIPAR) tablet 30 mg  30 mg Oral Q breakfast Shanda Howells, MD   30 mg at 10/27/13 1044  . dialysis solution 1.5% low-MG (DELFLEX) CAPD   Intraperitoneal Continuous Windy Kalata, MD      . dialysis solution 1.5% low-MG/low-CA (DELFLEX) CAPD   Intraperitoneal Continuous Windy Kalata, MD      . dialysis solution 2.5% low-MG [facility]) CAPD   Intraperitoneal Once in dialysis Donetta Potts, MD      . dialysis solution 2.5% low-MG (DELFLEX) CAPD   Intraperitoneal Continuous Donetta Potts, MD      . dialysis solution 2.5% low-MG [facility]) CAPD   Intraperitoneal Once in dialysis Donetta Potts, MD      . docusate sodium (COLACE) capsule 100 mg  100 mg Oral BID  Shanda Howells, MD   100 mg at 10/27/13 1043  . gabapentin (NEURONTIN) capsule 100 mg  100 mg Oral TID Nita Sells, MD      . heparin injection 500 Units  500 Units Intraperitoneal Continuous Windy Kalata, MD      . hydrALAZINE (APRESOLINE) tablet 25 mg  25 mg Oral QID Windy Kalata, MD   25 mg at 10/27/13 0119  . hydrALAZINE (APRESOLINE) tablet 50 mg  50 mg Oral PRN Shanda Howells, MD      . LORazepam (ATIVAN) tablet 1 mg  1 mg Oral Q8H PRN Shanda Howells, MD      . LORazepam (ATIVAN) tablet 1 mg  1 mg Oral BID Nita Sells, MD   1 mg at 10/27/13 1055  . [START ON 10/28/2013] metoprolol succinate (TOPROL-XL) 24 hr tablet 50 mg  50 mg Oral Daily Windy Kalata, MD      . multivitamin (RENA-VIT) tablet 1 tablet  1 tablet Oral QHS Mervin Kung, MD      . nicotine polacrilex (NICORETTE) gum 2 mg  2 mg Oral PRN Nita Sells, MD      . nitroGLYCERIN (NITROSTAT) SL tablet 0.4 mg  0.4 mg Sublingual Q5 min PRN Shanda Howells, MD   0.4 mg at 10/27/13 1221  . oxyCODONE-acetaminophen (  PERCOCET/ROXICET) 5-325 MG per tablet 1 tablet  1 tablet Oral Q6H PRN Mervin Kung, MD       And  . oxyCODONE (Oxy IR/ROXICODONE) immediate release tablet 5 mg  5 mg Oral Q6H PRN Mervin Kung, MD      . predniSONE (DELTASONE) tablet 60 mg  60 mg Oral QAC breakfast Nita Sells, MD   60 mg at 10/27/13 1043  . sertraline (ZOLOFT) tablet 50 mg  50 mg Oral Daily Shanda Howells, MD   50 mg at 10/27/13 1044  . sevelamer carbonate (RENVELA) tablet 800 mg  800 mg Oral TID WC Shanda Howells, MD   800 mg at 10/27/13 1253  . Warfarin - Pharmacist Dosing Inpatient   Does not apply q1800 Rogue Bussing, [facility]        PE: General appearance: alert, cooperative and no distress Lungs: diffuse wheezing bilaterally Heart: regular rate and rhythm and crisp valve sounds Extremities: no LEE Pulses: 2+ and symmetric Skin: warm and dry Neurologic: Grossly normal  Lab Results:    Recent Labs  10/26/13 1745 10/27/13 0341  WBC 16.9* 15.2*  HGB 11.8* 10.4*  HCT 37.2* 33.6*  PLT 193 161   BMET  Recent Labs  10/26/13 1745 10/27/13 0341  NA 140 144  K 4.2 4.3  CL 93* 97  CO2 27 26  GLUCOSE 129* 129*  BUN 52* 60*  CREATININE 12.81* 12.82*  CALCIUM 8.5 8.0*   PT/INR  Recent Labs  10/27/13 0341  LABPROT 40.9*  INR 4.49*   Cardiac Panel (last 3 results)  Recent Labs  10/26/13 2219 10/27/13 0341 10/27/13 1010  TROPONINI <0.30 <0.30 <0.30    Assessment/Plan    Active Problems:   HCAP (healthcare-associated pneumonia)  Plan: Troponin negative x 3. Negative LHC at [facility] in July 2014. Still w/ right sided chest pain, consistent with PNA. On IV antibiotics. Tachycardia resolved. HR stable. BP is borderline hypotensive. He is on 50 mg of Metoprolol daily and 25 mg of hydralazine QID. Can likely reduce hydralazine dose. Will discuss this with Dr. Mare Ferrari. On Warfarin for mechanical aortic valve. INR is supra therapeutic at 4.49. Warfarin now on hold per pharmacy. Watch for Warfarin + antibiotic interactions.  Recommend f/u with his primary cardiologist in [place] post discharge.    LOS: 1 day    Brittainy M. Rosita Fire, PA-C 10/27/2013 1:05 PM Agree with assessment and plan as noted above. BP is soft and will reduce hydralazine to q8h. Continue present BB because of sinus tachycardia.EKG reviewed and shows no acute changes. Patient states chest pain relieved after SL NTG.

## 2013-10-27 NOTE — Progress Notes (Signed)
Utilization Review Completed.Donne Anon T1/05/2014

## 2013-10-27 NOTE — Progress Notes (Signed)
ANTICOAGULATION CONSULT NOTE - Initial Consult  Pharmacy Consult for Coumadin Indication: AVR  Allergies  Allergen Reactions  . Codeine     unknown   Labs:  Recent Labs  10/26/13 1745 10/26/13 2035 10/26/13 2219 10/27/13 0341  HGB 11.8*  --   --  10.4*  HCT 37.2*  --   --  33.6*  PLT 193  --   --  161  LABPROT  --   --   --  40.9*  INR  --   --   --  4.49*  CREATININE 12.81*  --   --  12.82*  TROPONINI  --  <0.30 <0.30 <0.30    Estimated Creatinine Clearance: 7.3 ml/min (by C-G formula based on Cr of 12.82).   Assessment: 53yo male c/o CP, initially called as code STEMI which was subsequently cancelled, Dx changed to PNA, to continue Coumadin for AVR.  INR supra-therapeutic at 4.49 (home dose is 7.5 mg po daily)  Plan:  1) Hold Coumadin today 2) Daily INR  Thank you. Anette Guarneri, PharmD [phone]  10/27/2013,9:50 AM

## 2013-10-27 NOTE — Progress Notes (Signed)
Zachary [name]:[id number] DOB: 11-07-1960 DOA: 10/26/2013 PCP: No primary provider on file.  Brief narrative: 53 y/o, known history of congenital solitary left kidney , prior ESRD on HD>>PD 7 mo ago [secondary to right kidney from calculi-had percutaneous drain 10/26/2006], moderate AI status post replacement X2-->Mech revision 01/2012 [facility]], dysphagia, nonobstructive CAD per recent cardiac cath [facility] 04/2013. He is very conversant with his peritoneal dialysis from what he tells me and has been doing this since 2007. He usually exchanges 6 L every day over a 10 hour period, and if he will have doing a manual exchange and 34 to 35 minutes. His primary nephrologist is Dr. Hinda Lenis. He states that he presented with a one-week history of sharp left sided chest pain. He states that this was initially coming and going but then became a constant pain. He was brought to the emergency room as it was thought he was having an acute coronary syndrome. Cardiology was consulted He tells me that 2 weeks ago he was started on doxycycline for some rashes on his skin and is in the process of completing doxycycline 100 twice a day.  Emergency room workup = BUN 60, creatinine 12 WBC 16.9, hemoglobin 10.4, platelets 161, predominant neutrophilia INR 4.4 Peritoneal fluid = WBC 37,500, turbid, predominant neutrophilia 71% Blood culture X2 negative so far 1 view chest x-ray = patchy infiltrate left base, NT/changes  EKG unimpressive for end STEMI, STEMI    Past medical history-As per Problem list Chart reviewed as below- Reviewed  Consultants:  Cardiology  Renal  Procedures:  Chest x-ray 1 view   Antibiotics:  Vancomycin 1/7  Zosyn 1/7   Subjective  Looks great. No further chest pain currently. No abdominal pain. Tolerating diet. States that the chest pain has been coming on for the past week we can have. No diaphoresis associated with it or radiating nature. Localized under left breast. No lower  extremity edema or feeling short of breath with different positions. States that nitroglycerin took away the pain as well as aspirin States that he has been doing his dialysis everyday for 10 hours a day   Objective    Interim History: None  Telemetry: None telemetry as yet   Objective: Filed Vitals:   10/26/13 2245 10/26/13 2321 10/27/13 0348 10/27/13 0404  BP: 100/74 1$RemoveBef'05/71 78/27 93/63 'tLgrgpaPuE$  Pulse: 119 117 96 93  Temp: 98.4 F (36.9 C) 98.6 F (37 C) 98.3 F (36.8 C)   TempSrc: Oral Oral Oral   Resp: $Remo'19 18 16   'rbkSH$ Height:  $Remove'5\' 11"'fjEIttO$  (1.803 m)    Weight:  86.002 kg (189 lb 9.6 oz)    SpO2: 98% 98% 98%     Intake/Output Summary (Last 24 hours) at 10/27/13 0838 Last data filed at 10/27/13 0813  Gross per 24 hour  Intake    460 ml  Output      0 ml  Net    460 ml    Exam:  General: EOMI, NCAT, no JVD, no bruit, poor dentition, thyroidectomy scar? Cardiovascular: S1-S2 no murmur rub or gallop rate is about 50 Respiratory: Clinically clear no tactile vocal fremitus, no resonance, no egophony Abdomen: Soft nontender lower abdominal scar Skin no lower extremity edema Neuro grossly intact  Data Reviewed: Basic Metabolic Panel:  Recent Labs Lab 10/26/13 1745 10/27/13 0341  NA 140 144  K 4.2 4.3  CL 93* 97  CO2 27 26  GLUCOSE 129* 129*  BUN 52* 60*  CREATININE 12.81* 12.82*  CALCIUM  8.5 8.0*   Liver Function Tests:  Recent Labs Lab 10/27/13 0341  AST 10  ALT 9  ALKPHOS 48  BILITOT 0.3  PROT 6.1  ALBUMIN 3.0*   No results found for this basename: LIPASE, AMYLASE,  in the last 168 hours No results found for this basename: AMMONIA,  in the last 168 hours CBC:  Recent Labs Lab 10/26/13 1745 10/27/13 0341  WBC 16.9* 15.2*  NEUTROABS 15.0* 12.7*  HGB 11.8* 10.4*  HCT 37.2* 33.6*  MCV 104.2* 104.3*  PLT 193 161   Cardiac Enzymes:  Recent Labs Lab 10/26/13 2035 10/26/13 2219 10/27/13 0341  TROPONINI <0.30 <0.30 <0.30   BNP: No components found  with this basename: POCBNP,  CBG: No results found for this basename: GLUCAP,  in the last 168 hours  No results found for this or any previous visit (from the past 240 hour(s)).   Studies:              All Imaging reviewed and is as per above notation   Scheduled Meds: . aspirin  325 mg Oral Daily  . [START ON 10/28/2013] ceFEPime (MAXIPIME) IV  1 g Intravenous Q48H  . cinacalcet  30 mg Oral Q breakfast  . dialysis solution 2.5% low-MG   Intraperitoneal Once in dialysis  . dialysis solution 2.5% low-MG   Intraperitoneal Once in dialysis  . docusate sodium  100 mg Oral BID  . hydrALAZINE  25 mg Oral QID  . hydrocortisone  20 mg Oral Q breakfast  . metoprolol succinate  50 mg Oral Daily  . multivitamin  1 tablet Oral QHS  . NIFEdipine  90 mg Oral Daily  . sertraline  50 mg Oral Daily  . sevelamer carbonate  800 mg Oral TID WC  . Warfarin - Pharmacist Dosing Inpatient   Does not apply q1800   Continuous Infusions: . sodium chloride Stopped (10/26/13 1847)  . dialysis solution 2.5% low-MG       Assessment/Plan: 1. Chest pain-unclear etiology. DDX at this point maybe pneumonia, however he has not had any chills any fever any rigors and has not felt ill , not producing sputum. We will get a 2 view x-ray to better delineate this-in addition get lactic acid and pro calcitonin Troponin x3 has been normal.  Appreciate cardiology input-for now continue meds as per them.  Would consider echo unless has been done very recently has differential could be mild congestive heart failure 2. Leukocytosis-unclear if this could be secondary to infected dialysate?? nephrology to comment.  He has no abdominal pain whatsoever however, and has been treated for this in the past and this does not feel like it is infected to patient 3. End-stage renal disease, EGFR = 5 continuous PD-appreciate nephrology input 4. Possible  HCAP-see above. Try to obtain sputum.  Follow blood cultures. Narrow antibiotics when  able 5. ? Hemoptysis-unclear if this was from sputum been changed or not. Monitor. If recurs will consider discussion with pulmonary-note that his INR is 4.4. Presumably on this because of mechanical aortic valve. Pharmacy to kindly dose.  Hemoccult stools--if he has another episode he will need vitamin K 5 mg 6. Addison's-Continue Prednisone 60 mg burst-usually on 10 mg daily.  States was diagnosed 7 yrs ago. 7. Depression-continue sertraline 50 daily 8. Secondary hyperparathyroidism-continue Renvela and other meds 9. Smoker-tobacco patch offered 10. 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 Hypertension-continue meds as per cardiologist 12. Hyperglycemia-mild. Probably secondary to chronic steroids 13. Skin rash-discontinue doxycycline at  this stage rashes look fine 14. Anemia, undifferentiated -MCV 104 , hemoglobin 10.4-? Erythrogenic factor per discretion of nephrology    Code Status:  full code  Family Communication:  none at bedside  Disposition Plan: Inpatient   Verneita Griffes, MD  [facility] Pager [phone] 10/27/2013, 8:38 AM    LOS: 1 day

## 2013-10-27 NOTE — Progress Notes (Signed)
ANTICOAGULATION CONSULT NOTE - Initial Consult  Pharmacy Consult for Coumadin Indication: AVR  Allergies  Allergen Reactions  . Codeine     unknown    Patient Measurements: Height: 5\' 11"  (180.3 cm) Weight: 189 lb 9.6 oz (86.002 kg) IBW/kg (Calculated) : 75.3  Vital Signs: Temp: 98.6 F (37 C) (01/07 2321) Temp src: Oral (01/07 2321) BP: 105/71 mmHg (01/07 2321) Pulse Rate: 117 (01/07 2321)  Labs:  Recent Labs  10/26/13 1745 10/26/13 2035 10/26/13 2219  HGB 11.8*  --   --   HCT 37.2*  --   --   PLT 193  --   --   CREATININE 12.81*  --   --   TROPONINI  --  <0.30 <0.30    Estimated Creatinine Clearance: 7.3 ml/min (by C-G formula based on Cr of 12.81).   Medical History: Past Medical History  Diagnosis Date  . ESRD (end stage renal disease)     a. Dx ~ 2008, HD x 2 yrs then PD since.  . Hypertension   . Anxiety   . Chest pain     a. 2011 cath: Normal coronary arteries;  b. 04/2013 Cath: nonobs dzs - [facility]  . Hyperlipidemia   . TIA (transient ischemic attack)   . Tobacco abuse     a. ongoing (10/2013)  . Adrenal insufficiency   . Venous thrombosis   . Chronic pain syndrome   . Staghorn calculus     a. s/p bilateral nephrectomy  . Aortic insufficiency     a. 2011 s/p bioprosthetic AVR @ [facility] with subsequent failure and redo AVR with mechanical valve (SJM 44mm) 01/2012->chronic coumadin;  b. 04/2013 TEE and Echo ([facility]): No LA clot, nl fxning AVR, EF 35-40%, glob HK, mod dil LA, mild AI, mod TR, RV syst dysfxn.  . Depression     Medications:  Prescriptions prior to admission  Medication Sig Dispense Refill  . albuterol (PROVENTIL HFA;VENTOLIN HFA) 108 (90 BASE) MCG/ACT inhaler Inhale 2 puffs into the lungs every 4 (four) hours as needed for wheezing or shortness of breath.      . calcium acetate (PHOSLO) 667 MG capsule Take 667 mg by mouth 2 (two) times daily.      Marland Kitchen doxycycline (VIBRAMYCIN) 100 MG capsule Take 50 mg by mouth 2 (two) times daily.       . folic acid-vitamin b complex-vitamin c-selenium-zinc (DIALYVITE) 3 MG TABS tablet Take 1 tablet by mouth daily.      Marland Kitchen gabapentin (NEURONTIN) 100 MG capsule Take 100 mg by mouth 3 (three) times daily.      Marland Kitchen LORazepam (ATIVAN) 1 MG tablet Take 1 mg by mouth 2 (two) times daily.      . Multiple Vitamin (MULTIVITAMIN) LIQD Take 5 mLs by mouth daily.      . nitroGLYCERIN (NITROSTAT) 0.4 MG SL tablet Place 0.4 mg under the tongue every 5 (five) minutes as needed for chest pain.      Marland Kitchen oxyCODONE-acetaminophen (PERCOCET) 10-325 MG per tablet Take 1 tablet by mouth 3 (three) times daily.      . Polyethylene Glycol 3350 POWD 1 Package by Does not apply route daily as needed. For constipation      . predniSONE (DELTASONE) 10 MG tablet Take 10 mg by mouth 2 (two) times daily.      . sertraline (ZOLOFT) 50 MG tablet Take 50 mg by mouth daily.      . sevelamer (RENVELA) 800 MG tablet Take 800 mg by mouth  3 (three) times daily with meals.      . warfarin (COUMADIN) 7.5 MG tablet Take 7.5 mg by mouth daily.      Marland Kitchen aspirin 81 MG tablet Take 81 mg by mouth daily.       Scheduled:  . aspirin  325 mg Oral Daily  . [START ON 10/28/2013] ceFEPime (MAXIPIME) IV  1 g Intravenous Q48H  . cinacalcet  30 mg Oral Q breakfast  . dialysis solution 2.5% low-MG   Intraperitoneal Once in dialysis  . dialysis solution 2.5% low-MG   Intraperitoneal Once in dialysis  . docusate sodium  100 mg Oral BID  . hydrALAZINE  25 mg Oral QID  . hydrocortisone  20 mg Oral Q breakfast  . metoprolol succinate  50 mg Oral Daily  . multivitamin  1 tablet Oral QHS  . NIFEdipine  90 mg Oral Daily  . sertraline  50 mg Oral Daily  . sevelamer carbonate  800 mg Oral TID WC   Infusions:  . sodium chloride Stopped (10/26/13 1847)  . dialysis solution 2.5% low-MG      Assessment: 53yo male c/o CP, initially called as code STEMI which was subsequently cancelled, Dx changed to PNA, to continue Coumadin for AVR.  Plan:  Will obtain  current INR prior to dosing Coumadin.  Wynona Neat, PharmD, BCPS  10/27/2013,1:23 AM

## 2013-10-27 NOTE — Progress Notes (Signed)
10/27/2013 11:52 AM  This is a late entry.  Pt was on 2W this am receiving care when we received a phone call to [facility] requesting someone to come check his cycler because he didn't feel like he was draining appropriately during his CCPD treatment.  Myself and another RN went to assess the patient/cycler status.  Cycler was reading as being on Drain 1, which the patient recalls being on since earlier this am.  Approximately 3L of cloudy, slightly pink tinged fluid with small flecks of fibrin had noted to already be drained out of the patient.  Patient was moved to [address], where, after speaking with technical support on whether or not it was cycler issue with draining or perhaps an issue with fibrin, it was decided pursuing manual exchanges would be best.  Patient was disconnected from the cycler (still has about 1000cc of fluid on him that needs to be drained), and Dr. Mercy Moore with renal notified of issues with completing treatment as prescribed.  Was informed per MD that he would be up shortly, and we would discuss a further plan of action.  No immediate orders received at this time.  Pt appears comfortable at this time.  Will continue to monitor patient. Princella Pellegrini

## 2013-10-27 NOTE — Progress Notes (Signed)
Late entry: Patient complaining of R sided chest pain. Pt stated, "feels like a knife stabbing me". Patient stated it comes and goes. No other symptoms noted. MD Verlon Au and cardiology MD were notified. New orders placed. Patient stated pain subsides some when he rests. Will continue to monitor.

## 2013-10-27 NOTE — Progress Notes (Signed)
S: CO chest pain with pleuritic component.  Denies any abd pain O:BP 93/55  Pulse 90  Temp(Src) 98.1 F (36.7 C) (Oral)  Resp 18  Ht 5\' 11"  (1.803 m)  Wt 86.002 kg (189 lb 9.6 oz)  BMI 26.46 kg/m2  SpO2 97%  Intake/Output Summary (Last 24 hours) at 10/27/13 1247 Last data filed at 10/27/13 0813  Gross per 24 hour  Intake    460 ml  Output      0 ml  Net    460 ml   Weight change:  [id number] and alert CVS:RRR Resp:clear Abd:+ BS NTND  Exit site clean and dry Ext:no edema NEURO:CNI Ox3 No asterixis   . aspirin  325 mg Oral Daily  . calcium acetate  667 mg Oral BID WC  . ceFEPime (MAXIPIME) IV  1 g Intravenous Q24H  . cinacalcet  30 mg Oral Q breakfast  . dialysis solution 2.5% low-MG   Intraperitoneal Once in dialysis  . dialysis solution 2.5% low-MG   Intraperitoneal Once in dialysis  . docusate sodium  100 mg Oral BID  . gabapentin  100 mg Oral TID  . hydrALAZINE  25 mg Oral QID  . LORazepam  1 mg Oral BID  . metoprolol succinate  50 mg Oral Daily  . multivitamin  1 tablet Oral QHS  . NIFEdipine  90 mg Oral Daily  . predniSONE  60 mg Oral QAC breakfast  . sertraline  50 mg Oral Daily  . sevelamer carbonate  800 mg Oral TID WC  . Warfarin - Pharmacist Dosing Inpatient   Does not apply q1800   Dg Chest 2 View  10/27/2013   CLINICAL DATA:  Dyspnea and chest pain  EXAM: CHEST  2 VIEW  COMPARISON:  Portable chest x-ray at October 26, 2013.  FINDINGS: The lungs are well-expanded. The interstitial markings bilaterally are slightly more conspicuous today. There is persistent abnormally increased density at the left lung base anteriorly. The cardiopericardial silhouette is top-normal in size. The central pulmonary vascularity is minimally prominent but stable. There is no evidence of cephalization of the vascularity nor is there interstitial edema. There is a metallic ring in the aortic position.  IMPRESSION: There is persistent increased density at the left lung base consistent  with subsegmental atelectasis or early pneumonia. There is no definite evidence of CHF.   Electronically Signed   By: David  Martinique   On: 10/27/2013 11:48   Dg Chest Port 1 View  10/26/2013   CLINICAL DATA:  Chest pain and hypertension  EXAM: PORTABLE CHEST - 1 VIEW  COMPARISON:  August 01, 2013  FINDINGS: There is underlying emphysematous change. There is a small area of patchy infiltrate in the left base. Lungs elsewhere are clear. Heart is mildly enlarged. The pulmonary vascularity is within normal limits. No adenopathy. Patient is status post aortic valve replacement. No pneumothorax. No bone lesions.  IMPRESSION: There is a degree of underlying emphysematous change. There is a small area of patchy infiltrate in the left base. Elsewhere, lungs are clear. Heart mildly enlarged but stable.   Electronically Signed   By: Lowella Grip M.D.   On: 10/26/2013 18:12   BMET    Component Value Date/Time   NA 144 10/27/2013 0341   K 4.3 10/27/2013 0341   CL 97 10/27/2013 0341   CO2 26 10/27/2013 0341   GLUCOSE 129* 10/27/2013 0341   BUN 60* 10/27/2013 0341   CREATININE 12.82* 10/27/2013 0341   CALCIUM 8.0* 10/27/2013 [id number]  GFRNONAA 4* 10/27/2013 0341   GFRAA 5* 10/27/2013 0341   CBC    Component Value Date/Time   WBC 15.2* 10/27/2013 0341   RBC 3.22* 10/27/2013 0341   RBC 3.03* 10/27/2013 0341   HGB 10.4* 10/27/2013 0341   HCT 33.6* 10/27/2013 0341   PLT 161 10/27/2013 0341   MCV 104.3* 10/27/2013 0341   MCH 32.3 10/27/2013 0341   MCHC 31.0 10/27/2013 0341   RDW 17.3* 10/27/2013 0341   LYMPHSABS 1.1 10/27/2013 0341   MONOABS 1.3* 10/27/2013 0341   EOSABS 0.1 10/27/2013 0341   BASOSABS 0.1 10/27/2013 0341     Assessment: 1. Peritonitis 2. ? PNA 3. ESRD 4. HTN, though BP low now 5. Sec HPTH  Plan: 1. Culture PD fluid 2. Change to CAPD as he has fibrin from infection making CCPD difficult 3. Heparin in PD fluid 4. IV bolus of saline.  Can repeat if needed 5. Hold BP meds for SBP < 120 5. Recheck PD cell ct in  am   Kenlynn Houde T

## 2013-10-28 DIAGNOSIS — R079 Chest pain, unspecified: Secondary | ICD-10-CM | POA: Diagnosis not present

## 2013-10-28 DIAGNOSIS — I359 Nonrheumatic aortic valve disorder, unspecified: Secondary | ICD-10-CM

## 2013-10-28 DIAGNOSIS — T8571XA Infection and inflammatory reaction due to peritoneal dialysis catheter, initial encounter: Secondary | ICD-10-CM

## 2013-10-28 DIAGNOSIS — Z9889 Other specified postprocedural states: Secondary | ICD-10-CM | POA: Diagnosis not present

## 2013-10-28 DIAGNOSIS — J189 Pneumonia, unspecified organism: Secondary | ICD-10-CM | POA: Diagnosis not present

## 2013-10-28 DIAGNOSIS — N186 End stage renal disease: Secondary | ICD-10-CM | POA: Diagnosis not present

## 2013-10-28 LAB — CBC WITH DIFFERENTIAL/PLATELET
BASOS PCT: 0 % (ref 0–1)
Basophils Absolute: 0 10*3/uL (ref 0.0–0.1)
EOS ABS: 0 10*3/uL (ref 0.0–0.7)
Eosinophils Relative: 0 % (ref 0–5)
HEMATOCRIT: 35 % — AB (ref 39.0–52.0)
HEMOGLOBIN: 11.3 g/dL — AB (ref 13.0–17.0)
Lymphocytes Relative: 4 % — ABNORMAL LOW (ref 12–46)
Lymphs Abs: 0.5 10*3/uL — ABNORMAL LOW (ref 0.7–4.0)
MCH: 32.8 pg (ref 26.0–34.0)
MCHC: 32.3 g/dL (ref 30.0–36.0)
MCV: 101.7 fL — ABNORMAL HIGH (ref 78.0–100.0)
MONO ABS: 0.1 10*3/uL (ref 0.1–1.0)
Monocytes Relative: 1 % — ABNORMAL LOW (ref 3–12)
Neutro Abs: 10.5 10*3/uL — ABNORMAL HIGH (ref 1.7–7.7)
Neutrophils Relative %: 94 % — ABNORMAL HIGH (ref 43–77)
Platelets: 180 10*3/uL (ref 150–400)
RBC: 3.44 MIL/uL — ABNORMAL LOW (ref 4.22–5.81)
RDW: 17.2 % — AB (ref 11.5–15.5)
WBC: 11.2 10*3/uL — ABNORMAL HIGH (ref 4.0–10.5)

## 2013-10-28 LAB — COMPREHENSIVE METABOLIC PANEL
ALBUMIN: 3.1 g/dL — AB (ref 3.5–5.2)
ALT: 9 U/L (ref 0–53)
AST: 11 U/L (ref 0–37)
Alkaline Phosphatase: 51 U/L (ref 39–117)
BILIRUBIN TOTAL: 0.3 mg/dL (ref 0.3–1.2)
BUN: 55 mg/dL — AB (ref 6–23)
CO2: 23 mEq/L (ref 19–32)
CREATININE: 11.66 mg/dL — AB (ref 0.50–1.35)
Calcium: 8.2 mg/dL — ABNORMAL LOW (ref 8.4–10.5)
Chloride: 93 mEq/L — ABNORMAL LOW (ref 96–112)
GFR calc Af Amer: 5 mL/min — ABNORMAL LOW (ref >90)
GFR calc non Af Amer: 4 mL/min — ABNORMAL LOW (ref >90)
Glucose, Bld: 225 mg/dL — ABNORMAL HIGH (ref 70–99)
Potassium: 4.9 mEq/L (ref 3.7–5.3)
Sodium: 137 mEq/L (ref 137–147)
Total Protein: 6.7 g/dL (ref 6.0–8.3)

## 2013-10-28 LAB — PROTIME-INR
INR: 3.67 — ABNORMAL HIGH (ref 0.00–1.49)
PROTHROMBIN TIME: 35.1 s — AB (ref 11.6–15.2)

## 2013-10-28 LAB — BODY FLUID CELL COUNT WITH DIFFERENTIAL
Lymphs, Fluid: 8 %
Monocyte-Macrophage-Serous Fluid: 57 % (ref 50–90)
NEUTROPHIL FLUID: 35 % — AB (ref 0–25)
Total Nucleated Cell Count, Fluid: 200 cu mm (ref 0–1000)

## 2013-10-28 LAB — CORTISOL-AM, BLOOD: Cortisol - AM: 2.1 ug/dL — ABNORMAL LOW (ref 4.3–22.4)

## 2013-10-28 LAB — VANCOMYCIN, RANDOM: Vancomycin Rm: 20.8 ug/mL

## 2013-10-28 LAB — TROPONIN I: Troponin I: <0.3 ng/mL (ref <0.30)

## 2013-10-28 MED ORDER — VANCOMYCIN HCL 10 G IV SOLR: 1500.0000 mg | Freq: Once | INTRAVENOUS | Status: DC

## 2013-10-28 MED ORDER — CIPROFLOXACIN HCL 500 MG PO TABS: 500.0000 mg | ORAL_TABLET | Freq: Two times a day (BID) | ORAL | Status: DC

## 2013-10-28 MED ORDER — METOPROLOL SUCCINATE ER 50 MG PO TB24: 50.0000 mg | ORAL_TABLET | Freq: Every day | ORAL | Status: DC

## 2013-10-28 MED ORDER — PREDNISONE 20 MG PO TABS: 60.0000 mg | ORAL_TABLET | Freq: Every day | ORAL | Status: DC

## 2013-10-28 NOTE — Progress Notes (Signed)
Patient discharged home with family. Patient was discharged with prescriptions and information on peritoneal dialysis/antibiotics. Patient was told to contact the doctor with questions and concerns. Patient was stable upon discharge.

## 2013-10-28 NOTE — Progress Notes (Signed)
S: CP better  Denies any abd pain O:BP 112/71  Pulse 97  Temp(Src) 98.1 F (36.7 C) (Oral)  Resp 18  Ht 5\' 11"  (1.803 m)  Wt 92.806 kg (204 lb 9.6 oz)  BMI 28.55 kg/m2  SpO2 96%  Intake/Output Summary (Last 24 hours) at 10/28/13 0856 Last data filed at 10/27/13 2234  Gross per 24 hour  Intake    840 ml  Output      0 ml  Net    840 ml   Weight change: 8.891 kg (19 lb 9.6 oz) [id number] and alert CVS:RRR Resp:clear Abd:+ BS NTND  Exit site clean and dry Ext:no edema NEURO:CNI Ox3 No asterixis   . aspirin  325 mg Oral Daily  . calcium acetate  667 mg Oral BID WC  . ceFEPime (MAXIPIME) IV  1 g Intravenous Q24H  . cinacalcet  30 mg Oral Q breakfast  . dialysis solution 2.5% low-MG   Intraperitoneal Once in dialysis  . dialysis solution 2.5% low-MG   Intraperitoneal Once in dialysis  . docusate sodium  100 mg Oral BID  . gabapentin  100 mg Oral TID  . hydrALAZINE  25 mg Oral Q8H  . LORazepam  1 mg Oral BID  . metoprolol succinate  50 mg Oral Daily  . multivitamin  1 tablet Oral QHS  . predniSONE  60 mg Oral QAC breakfast  . sertraline  50 mg Oral Daily  . sevelamer carbonate  800 mg Oral TID WC  . Warfarin - Pharmacist Dosing Inpatient   Does not apply q1800   Dg Chest 2 View  10/27/2013   CLINICAL DATA:  Dyspnea and chest pain  EXAM: CHEST  2 VIEW  COMPARISON:  Portable chest x-ray at October 26, 2013.  FINDINGS: The lungs are well-expanded. The interstitial markings bilaterally are slightly more conspicuous today. There is persistent abnormally increased density at the left lung base anteriorly. The cardiopericardial silhouette is top-normal in size. The central pulmonary vascularity is minimally prominent but stable. There is no evidence of cephalization of the vascularity nor is there interstitial edema. There is a metallic ring in the aortic position.  IMPRESSION: There is persistent increased density at the left lung base consistent with subsegmental atelectasis or early  pneumonia. There is no definite evidence of CHF.   Electronically Signed   By: David  Martinique   On: 10/27/2013 11:48   Dg Chest Port 1 View  10/26/2013   CLINICAL DATA:  Chest pain and hypertension  EXAM: PORTABLE CHEST - 1 VIEW  COMPARISON:  August 01, 2013  FINDINGS: There is underlying emphysematous change. There is a small area of patchy infiltrate in the left base. Lungs elsewhere are clear. Heart is mildly enlarged. The pulmonary vascularity is within normal limits. No adenopathy. Patient is status post aortic valve replacement. No pneumothorax. No bone lesions.  IMPRESSION: There is a degree of underlying emphysematous change. There is a small area of patchy infiltrate in the left base. Elsewhere, lungs are clear. Heart mildly enlarged but stable.   Electronically Signed   By: Lowella Grip M.D.   On: 10/26/2013 18:12   BMET    Component Value Date/Time   NA 137 10/28/2013 0334   K 4.9 10/28/2013 0334   CL 93* 10/28/2013 0334   CO2 23 10/28/2013 0334   GLUCOSE 225* 10/28/2013 0334   BUN 55* 10/28/2013 0334   CREATININE 11.66* 10/28/2013 0334   CALCIUM 8.2* 10/28/2013 0334   GFRNONAA 4* 10/28/2013 [id number]  GFRAA 5* 10/28/2013 0334   CBC    Component Value Date/Time   WBC 11.2* 10/28/2013 0334   RBC 3.44* 10/28/2013 0334   RBC 3.03* 10/27/2013 0341   HGB 11.3* 10/28/2013 0334   HCT 35.0* 10/28/2013 0334   PLT 180 10/28/2013 0334   MCV 101.7* 10/28/2013 0334   MCH 32.8 10/28/2013 0334   MCHC 32.3 10/28/2013 0334   RDW 17.2* 10/28/2013 0334   LYMPHSABS 0.5* 10/28/2013 0334   MONOABS 0.1 10/28/2013 0334   EOSABS 0.0 10/28/2013 0334   BASOSABS 0.0 10/28/2013 0334     Assessment: 1. Peritonitis, PD fluid looks much better and not having any more problems with exchanges 2. ? PNA 3. ESRD 4. HTN, though BP low now 5. Sec HPTH  Plan: 1. He wants to go home and from renal standpoint he can.  Will need to contact his dx unit to FU on cultures and redose vanco.  Rec cipro 500mg  q d to cover gm neg.  He was on doxycycline  PTA which may affect the culture.   Zachary [name]

## 2013-10-28 NOTE — Progress Notes (Signed)
ANTICOAGULATION / Antibiotic  CONSULT NOTE -  Pharmacy Consult for Coumadin / Vancomycn Indication: AVR  Allergies  Allergen Reactions  . Codeine     unknown   Labs:  Recent Labs  10/26/13 1745  10/27/13 0341 10/27/13 1010 10/27/13 1840 10/28/13 0334  HGB 11.8*  --  10.4*  --   --  11.3*  HCT 37.2*  --  33.6*  --   --  35.0*  PLT 193  --  161  --   --  180  LABPROT  --   --  40.9*  --   --  35.1*  INR  --   --  4.49*  --   --  3.67*  CREATININE 12.81*  --  12.82*  --   --  11.66*  TROPONINI  --   < > <0.30 <0.30 <0.30 <0.30  < > = values in this interval not displayed.  Estimated Creatinine Clearance: 8.7 ml/min (by C-G formula based on Cr of 11.66).   Assessment: 53yo male c/o CP, initially called as code STEMI which was subsequently cancelled, Dx changed to PNA / infected dialysate, to continue Coumadin for AVR.  Continues on Vancomycin and Cefepime - Day #3 (of 8), afebrile, cultures negative to date.    Vancomycin random level this AM = 20.7  INR supra-therapeutic at 3.67, but trending down (home dose is 7.5 mg po daily)  Plan:  1) Hold Coumadin today 2) Daily INR 3) Repeat Vancomycin dose at 1500 mg iv x 1 in AM (1/10) 4) Continue Cefepime 1 Gram iv Q 24 hours  Thank you. Anette Guarneri, PharmD [phone]  10/28/2013,8:56 AM

## 2013-10-28 NOTE — Progress Notes (Signed)
Subjective: No further chest pain or dyspnea. Rhythm is stable NSR   Objective: Vital signs in last 24 hours: Temp:  [97.8 F (36.6 C)-98.7 F (37.1 C)] 98.1 F (36.7 C) (01/09 0756) Pulse Rate:  [90-110] 97 (01/09 0756) Resp:  [17-18] 18 (01/09 0756) BP: (93-123)/(55-103) 112/71 mmHg (01/09 0756) SpO2:  [94 %-100 %] 96 % (01/09 0756) Weight:  [204 lb 9.6 oz (92.806 kg)] 204 lb 9.6 oz (92.806 kg) (01/08 2233)    Intake/Output from previous day: 01/08 0701 - 01/09 0700 In: 1060 [P.O.:1060] Out: -  Intake/Output this shift:    Medications Current Facility-Administered Medications  Medication Dose Route Frequency Provider Last Rate Last Dose  . albuterol (PROVENTIL HFA;VENTOLIN HFA) 108 (90 BASE) MCG/ACT inhaler 2 puff  2 puff Inhalation Q4H PRN Nita Sells, MD      . aspirin tablet 325 mg  325 mg Oral Daily Mervin Kung, MD   325 mg at 10/27/13 1044  . calcium acetate (PHOSLO) capsule 667 mg  667 mg Oral BID WC Nita Sells, MD   667 mg at 10/27/13 1757  . ceFEPIme (MAXIPIME) 1 g in dextrose 5 % 50 mL IVPB  1 g Intravenous Q24H Nita Sells, MD   1 g at 10/27/13 2239  . cinacalcet (SENSIPAR) tablet 30 mg  30 mg Oral Q breakfast Shanda Howells, MD   30 mg at 10/27/13 1044  . dialysis solution 1.5% low-MG (DELFLEX) CAPD   Intraperitoneal Continuous Windy Kalata, MD      . dialysis solution 1.5% low-MG/low-CA (DELFLEX) CAPD   Intraperitoneal Continuous Windy Kalata, MD      . dialysis solution 2.5% low-MG [facility]) CAPD   Intraperitoneal Once in dialysis Donetta Potts, MD      . dialysis solution 2.5% low-MG (DELFLEX) CAPD   Intraperitoneal Continuous Donetta Potts, MD      . dialysis solution 2.5% low-MG [facility]) CAPD   Intraperitoneal Once in dialysis Donetta Potts, MD      . docusate sodium (COLACE) capsule 100 mg  100 mg Oral BID Shanda Howells, MD   100 mg at 10/27/13 2238  . gabapentin (NEURONTIN) capsule 100 mg   100 mg Oral TID Nita Sells, MD   100 mg at 10/27/13 2239  . heparin injection 1,500 Units  1,500 Units Intraperitoneal Continuous Windy Kalata, MD   1,500 Units at 10/28/13 0802  . hydrALAZINE (APRESOLINE) tablet 25 mg  25 mg Oral Q8H Darlin Coco, MD   25 mg at 10/27/13 2238  . hydrALAZINE (APRESOLINE) tablet 50 mg  50 mg Oral PRN Shanda Howells, MD      . LORazepam (ATIVAN) tablet 1 mg  1 mg Oral Q8H PRN Shanda Howells, MD      . LORazepam (ATIVAN) tablet 1 mg  1 mg Oral BID Nita Sells, MD   1 mg at 10/27/13 2238  . metoprolol succinate (TOPROL-XL) 24 hr tablet 50 mg  50 mg Oral Daily Windy Kalata, MD      . multivitamin (RENA-VIT) tablet 1 tablet  1 tablet Oral QHS Mervin Kung, MD   1 tablet at 10/27/13 2238  . nicotine polacrilex (NICORETTE) gum 2 mg  2 mg Oral PRN Nita Sells, MD      . nitroGLYCERIN (NITROSTAT) SL tablet 0.4 mg  0.4 mg Sublingual Q5 min PRN Shanda Howells, MD   0.4 mg at 10/27/13 1221  . oxyCODONE-acetaminophen (PERCOCET/ROXICET) 5-325 MG per tablet 1 tablet  1  tablet Oral Q6H PRN Mervin Kung, MD   1 tablet at 10/27/13 2237   And  . oxyCODONE (Oxy IR/ROXICODONE) immediate release tablet 5 mg  5 mg Oral Q6H PRN Mervin Kung, MD   5 mg at 10/27/13 2237  . predniSONE (DELTASONE) tablet 60 mg  60 mg Oral QAC breakfast Nita Sells, MD   60 mg at 10/27/13 1043  . sertraline (ZOLOFT) tablet 50 mg  50 mg Oral Daily Shanda Howells, MD   50 mg at 10/27/13 1044  . sevelamer carbonate (RENVELA) tablet 800 mg  800 mg Oral TID WC Shanda Howells, MD   800 mg at 10/27/13 1758  . [START ON 10/29/2013] vancomycin (VANCOCIN) 1,500 mg in sodium chloride 0.9 % 500 mL IVPB  1,500 mg Intravenous Once Nita Sells, MD      . Warfarin - Pharmacist Dosing Inpatient   Does not apply q1800 [place], [facility]        PE: General appearance: alert, cooperative and no distress Lungs: clear. Heart: regular rate and rhythm  and crisp valve sounds Extremities: no LEE Pulses: 2+ and symmetric Skin: warm and dry Neurologic: Grossly normal  Lab Results:   Recent Labs  10/26/13 1745 10/27/13 0341 10/28/13 0334  WBC 16.9* 15.2* 11.2*  HGB 11.8* 10.4* 11.3*  HCT 37.2* 33.6* 35.0*  PLT 193 161 180   BMET  Recent Labs  10/26/13 1745 10/27/13 0341 10/28/13 0334  NA 140 144 137  K 4.2 4.3 4.9  CL 93* 97 93*  CO2 27 26 23   GLUCOSE 129* 129* 225*  BUN 52* 60* 55*  CREATININE 12.81* 12.82* 11.66*  CALCIUM 8.5 8.0* 8.2*   PT/INR  Recent Labs  10/27/13 0341 10/28/13 0334  LABPROT 40.9* 35.1*  INR 4.49* 3.67*   Cardiac Panel (last 3 results)  Recent Labs  10/27/13 1010 10/27/13 1840 10/28/13 0334  TROPONINI <0.30 <0.30 <0.30    Assessment/Plan    Active Problems:   HCAP (healthcare-associated pneumonia)  Plan: Troponin negative x 3. Negative LHC at [facility] in July 2014.  On Warfarin for mechanical aortic valve. INR is supra therapeutic. Warfarin now on hold per pharmacy. Watch for Warfarin + antibiotic interactions.  Recommend f/u with his primary cardiologist in [place] post discharge. No further cardiac tests are planned. He feels he is back to baseline. [place] for discharge home from cardiac standpoint with close outpatient followup in [facility]   LOS: 2 days

## 2013-10-28 NOTE — Discharge Summary (Signed)
Physician Discharge Summary  Zachary [name] DUK:[id number] DOB: 01/11/1961 DOA: 10/26/2013  PCP: No primary provider on file.  Admit date: 10/26/2013 Discharge date: 10/28/2013  Time spent: 45 minutes  Recommendations for Outpatient Follow-up:  1. Continue vancomycin in your dialysate fluid until instructed to stop by Dr. Carlyon Prows to continue by mouth ciprofloxacin 500 twice a day until 11/07/13 2. Continue prednisone 60 mg for the time being 3. Get renal panel and CBC in about a week-in addition he will need an INR level in one to 2 days 4. Patient may need to have erythrogenic factor/IV iron as an outpatient, iron level this admission was 20, hemoglobin was stable at 11 5. Please followup dialysate cultures-Gram stain showed numerous neutrophilic cells 6. Multiple antihypertensive medications discontinued this hospital admission-per nephrology and primary care physician as an outpatient to implement them back slowly-old male metoprolol 50 mg daily XL  Discharge Diagnoses:  Principal Problem:   Peritonitis associated with peritoneal dialysis Active Problems:   AORTIC INSUFFICIENCY, MODERATE   PULMONARY EDEMA   CHEST PAIN, ATYPICAL   DYSPHAGIA UNSPECIFIED   HCAP (healthcare-associated pneumonia)   Discharge Condition: Stable  Diet recommendation: Heart healthy  Filed Weights   10/26/13 1900 10/26/13 2321 10/27/13 2233  Weight: 83.915 kg (185 lb) 86.002 kg (189 lb 9.6 oz) 92.806 kg (204 lb 9.6 oz)    History of present illness:  53 y/o, known history of congenital solitary left kidney , prior ESRD on HD>>PD 7 mo ago [secondary to right kidney from calculi-had percutaneous drain 10/26/2006], moderate AI status post replacement X2-->Mech revision 01/2012 [facility]], dysphagia, nonobstructive CAD per recent cardiac cath [facility] 04/2013.  He is very conversant with his peritoneal dialysis from what he tells me and has been doing this since 2007. He usually exchanges 6 L every day over a 10 hour  period, and if he will have doing a manual exchange and 34 to 35 minutes.  His primary nephrologist is Dr. Hinda Lenis.  He states that he presented with a one-week history of sharp left sided chest pain. He states that this was initially coming and going but then became a constant pain. He was brought to the emergency room as it was thought he was having an acute coronary syndrome. Cardiology was consulted  He tells me that 2 weeks ago he was started on doxycycline for some rashes on his skin and is in the process of completing doxycycline 100 twice a day.   Emergency room workup = BUN 60, creatinine 12  WBC 16.9, hemoglobin 10.4, platelets 161, predominant neutrophilia  INR 4.4  Peritoneal fluid = WBC 37,500, turbid, predominant neutrophilia 71%-his cell count showed multiple lymphocytes Blood culture X2 negative so far  1 view chest x-ray = patchy infiltrate left base, NT/changes EKG unimpressive for NTEMI, STEMI   Hospital Course:  1. Chest pain-unclear etiology. not producing sputum. Two-view x-ray did not confirm specifically pneumonia. Pro calcitonin however 2.09, lactic acid 1.1.  See below 2. Leukocytosis-unclear if this could be secondary to infected dialysate?? nephrology conferred and thought that it was a good idea to place him on peritoneal vancomycin and by mouth ciprofloxacin on discharge-start date of ciprofloxacin 11/07/13, peritoneal vancomycin to be discontinued at discretion of Dr. Lowanda Foster 3. End-stage renal disease, EGFR = 5 continuous PD input 4. Possible HCAP-see above. 5. ? Hemoptysis-Presumably on this because of mechanical aortic valve.unclear if this was from sputum been changed or not. Monitor. If recurs will consider discussion with pulmonary-note that his INR is 4.4->  this trended down to 3.6 on discharge-continue home dosing and careful coordination of this as an outpatient 6. Addison's-Continue Prednisone 60 mg burst-usually on 10 mg daily. States was diagnosed 7 yrs  ago. A.m. free cortisol was low at 2.1-will need reevaluation of this as an outpatient 7. Depression-continue sertraline 50 daily 8. Secondary hyperparathyroidism-continue Renvela and other meds 9. Smoker-tobacco patch offered 10. 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 Hypertension-continue meds as per cardiologist-hydralazine was discontinued per cardiology, he will continue on his beta blocker however 50 mg metoprolol XL 12. Hyperglycemia-mild. Probably secondary to chronic steroids-consider A1c as an outpatient 13. Skin rash-discontinue doxycycline at this stage rashes look fine 14. Anemia, undifferentiated -MCV 104 , hemoglobin 10.4-? Erythrogenic factor per discretion of nephrology as an outpatient  Consultants:  Cardiology  Renal Procedures:  Chest x-ray 1 view Antibiotics:  Vancomycin 1/7>>> Ciprofloxacin 1/9>>1/19  Zosyn 1/7->1/9  Discharge Exam: Filed Vitals:   10/28/13 0756  BP: 112/71  Pulse: 97  Temp: 98.1 F (36.7 C)  Resp: 18    General: Alert oriented pleasant Cardiovascular: S1-S2 no murmur or gallop Respiratory: Clinically clear  Discharge Instructions  Discharge Orders   Future Orders Complete By Expires   Diet - low sodium heart healthy  As directed    Discharge instructions  As directed    Comments:     You will need intraperitoneal vancomycin which is being arranged by Dr. Lujean Amel followup with Dr. Hinda Lenis in [place]  Please see your regular physician for refills of other medications  You will notice that most for anti-hypertensive medications have been discontinued. Please followup with her nephrologist or irregular physician with regards to restarting these if you find a blood pressure of 150/90  Please continue prednisone at a higher dose than usual until he see her physician had given her 5 day supply  You were cared for by a hospitalist during your hospital stay. If you have any questions about your discharge  medications or the care you received while you were in the hospital after you are discharged, you can call the unit and asked to speak with the hospitalist on call if the hospitalist that took care of you is not available. Once you are discharged, your primary care physician will handle any further medical issues. Please note that NO REFILLS for any discharge medications will be authorized once you are discharged, as it is imperative that you return to your primary care physician (or establish a relationship with a primary care physician if you do not have one) for your aftercare needs so that they can reassess your need for medications and monitor your lab values. If you do not have a primary care physician, you can call [phone] for a physician referral.   Increase activity slowly  As directed        Medication List    STOP taking these medications       doxycycline 100 MG capsule  Commonly known as:  VIBRAMYCIN      TAKE these medications       albuterol 108 (90 BASE) MCG/ACT inhaler  Commonly known as:  PROVENTIL HFA;VENTOLIN HFA  Inhale 2 puffs into the lungs every 4 (four) hours as needed for wheezing or shortness of breath.     aspirin 81 MG tablet  Take 81 mg by mouth daily.     calcium acetate 667 MG capsule  Commonly known as:  PHOSLO  Take 667 mg by mouth 2 (two) times daily.  folic acid-vitamin b complex-vitamin c-selenium-zinc 3 MG Tabs tablet  Take 1 tablet by mouth daily.     gabapentin 100 MG capsule  Commonly known as:  NEURONTIN  Take 100 mg by mouth 3 (three) times daily.     LORazepam 1 MG tablet  Commonly known as:  ATIVAN  Take 1 mg by mouth 2 (two) times daily.     multivitamin Liqd  Take 5 mLs by mouth daily.     nitroGLYCERIN 0.4 MG SL tablet  Commonly known as:  NITROSTAT  Place 0.4 mg under the tongue every 5 (five) minutes as needed for chest pain.     oxyCODONE-acetaminophen 10-325 MG per tablet  Commonly known as:  PERCOCET  Take 1  tablet by mouth 3 (three) times daily.     Polyethylene Glycol 3350 Powd  1 Package by Does not apply route daily as needed. For constipation     predniSONE 20 MG tablet  Commonly known as:  DELTASONE  Take 3 tablets (60 mg total) by mouth daily before breakfast.     RENVELA 800 MG tablet  Generic drug:  sevelamer carbonate  Take 800 mg by mouth 3 (three) times daily with meals.     sertraline 50 MG tablet  Commonly known as:  ZOLOFT  Take 50 mg by mouth daily.     warfarin 7.5 MG tablet  Commonly known as:  COUMADIN  Take 7.5 mg by mouth daily.       Allergies  Allergen Reactions  . Codeine     unknown      The results of significant diagnostics from this hospitalization (including imaging, microbiology, ancillary and laboratory) are listed below for reference.    Significant Diagnostic Studies: Dg Chest 2 View  10/27/2013   CLINICAL DATA:  Dyspnea and chest pain  EXAM: CHEST  2 VIEW  COMPARISON:  Portable chest x-ray at October 26, 2013.  FINDINGS: The lungs are well-expanded. The interstitial markings bilaterally are slightly more conspicuous today. There is persistent abnormally increased density at the left lung base anteriorly. The cardiopericardial silhouette is top-normal in size. The central pulmonary vascularity is minimally prominent but stable. There is no evidence of cephalization of the vascularity nor is there interstitial edema. There is a metallic ring in the aortic position.  IMPRESSION: There is persistent increased density at the left lung base consistent with subsegmental atelectasis or early pneumonia. There is no definite evidence of CHF.   Electronically Signed   By: David  Martinique   On: 10/27/2013 11:48   Dg Chest Port 1 View  10/26/2013   CLINICAL DATA:  Chest pain and hypertension  EXAM: PORTABLE CHEST - 1 VIEW  COMPARISON:  August 01, 2013  FINDINGS: There is underlying emphysematous change. There is a small area of patchy infiltrate in the left base.  Lungs elsewhere are clear. Heart is mildly enlarged. The pulmonary vascularity is within normal limits. No adenopathy. Patient is status post aortic valve replacement. No pneumothorax. No bone lesions.  IMPRESSION: There is a degree of underlying emphysematous change. There is a small area of patchy infiltrate in the left base. Elsewhere, lungs are clear. Heart mildly enlarged but stable.   Electronically Signed   By: Lowella Grip M.D.   On: 10/26/2013 18:12    Microbiology: Recent Results (from the past 240 hour(s))  CULTURE, BLOOD (ROUTINE X 2)     Status: None   Collection Time    10/26/13  7:30 PM  Result Value Range Status   Specimen Description BLOOD ARM LEFT   Final   Special Requests BOTTLES DRAWN AEROBIC ONLY 10CC   Final   Culture  Setup Time     Final   Value: 10/27/2013 01:05     Performed at [facility]   Culture     Final   Value:        BLOOD CULTURE RECEIVED NO GROWTH TO DATE CULTURE WILL BE HELD FOR 5 DAYS BEFORE ISSUING A FINAL NEGATIVE REPORT     Performed at [facility]   Report Status PENDING   Incomplete  CULTURE, BLOOD (ROUTINE X 2)     Status: None   Collection Time    10/26/13  7:35 PM      Result Value Range Status   Specimen Description BLOOD HAND LEFT   Final   Special Requests BOTTLES DRAWN AEROBIC AND ANAEROBIC 5CC   Final   Culture  Setup Time     Final   Value: 10/27/2013 01:05     Performed at [facility]   Culture     Final   Value:        BLOOD CULTURE RECEIVED NO GROWTH TO DATE CULTURE WILL BE HELD FOR 5 DAYS BEFORE ISSUING A FINAL NEGATIVE REPORT     Performed at [facility]   Report Status PENDING   Incomplete  BODY FLUID CULTURE     Status: None   Collection Time    10/27/13  2:50 PM      Result Value Range Status   Specimen Description FLUID PERITONEAL   Final   Special Requests NONE   Final   Gram Stain     Final   Value: NO WBC SEEN     NO ORGANISMS SEEN     Performed at [facility]    Culture     Final   Value: NO GROWTH 1 DAY     Performed at [facility]   Report Status PENDING   Incomplete     Labs: Basic Metabolic Panel:  Recent Labs Lab 10/26/13 1745 10/27/13 0341 10/27/13 1010 10/28/13 0334  NA 140 144  --  137  K 4.2 4.3  --  4.9  CL 93* 97  --  93*  CO2 27 26  --  23  GLUCOSE 129* 129*  --  225*  BUN 52* 60*  --  55*  CREATININE 12.81* 12.82*  --  11.66*  CALCIUM 8.5 8.0*  --  8.2*  PHOS  --   --  8.2*  --    Liver Function Tests:  Recent Labs Lab 10/27/13 0341 10/28/13 0334  AST 10 11  ALT 9 9  ALKPHOS 48 51  BILITOT 0.3 0.3  PROT 6.1 6.7  ALBUMIN 3.0* 3.1*   No results found for this basename: LIPASE, AMYLASE,  in the last 168 hours No results found for this basename: AMMONIA,  in the last 168 hours CBC:  Recent Labs Lab 10/26/13 1745 10/27/13 0341 10/28/13 0334  WBC 16.9* 15.2* 11.2*  NEUTROABS 15.0* 12.7* 10.5*  HGB 11.8* 10.4* 11.3*  HCT 37.2* 33.6* 35.0*  MCV 104.2* 104.3* 101.7*  PLT 193 161 180   Cardiac Enzymes:  Recent Labs Lab 10/26/13 2219 10/27/13 0341 10/27/13 1010 10/27/13 1840 10/28/13 0334  TROPONINI <0.30 <0.30 <0.30 <0.30 <0.30   BNP: BNP (last 3 results) No results found for this basename: PROBNP,  in the last 8760 hours  CBG: No results found for this basename: GLUCAP,  in the last 168 hours     Signed:  Nita Sells  [facility] 10/28/2013, 10:49 AM

## 2013-10-30 LAB — BODY FLUID CULTURE
CULTURE: NO GROWTH
GRAM STAIN: NONE SEEN

## 2013-10-31 LAB — PATHOLOGIST SMEAR REVIEW

## 2013-11-02 LAB — CULTURE, BLOOD (ROUTINE X 2)
CULTURE: NO GROWTH
Culture: NO GROWTH

## 2013-11-19 DIAGNOSIS — N186 End stage renal disease: Secondary | ICD-10-CM | POA: Diagnosis not present

## 2013-11-20 DIAGNOSIS — N186 End stage renal disease: Secondary | ICD-10-CM | POA: Diagnosis not present

## 2013-11-20 DIAGNOSIS — Z992 Dependence on renal dialysis: Secondary | ICD-10-CM | POA: Diagnosis not present

## 2013-11-20 DIAGNOSIS — D631 Anemia in chronic kidney disease: Secondary | ICD-10-CM | POA: Diagnosis not present

## 2013-11-20 DIAGNOSIS — D509 Iron deficiency anemia, unspecified: Secondary | ICD-10-CM | POA: Diagnosis not present

## 2013-11-20 DIAGNOSIS — N039 Chronic nephritic syndrome with unspecified morphologic changes: Secondary | ICD-10-CM | POA: Diagnosis not present

## 2013-11-20 DIAGNOSIS — N2581 Secondary hyperparathyroidism of renal origin: Secondary | ICD-10-CM | POA: Diagnosis not present

## 2013-11-21 DIAGNOSIS — N2581 Secondary hyperparathyroidism of renal origin: Secondary | ICD-10-CM | POA: Diagnosis not present

## 2013-11-21 DIAGNOSIS — D509 Iron deficiency anemia, unspecified: Secondary | ICD-10-CM | POA: Diagnosis not present

## 2013-11-21 DIAGNOSIS — D631 Anemia in chronic kidney disease: Secondary | ICD-10-CM | POA: Diagnosis not present

## 2013-11-21 DIAGNOSIS — N186 End stage renal disease: Secondary | ICD-10-CM | POA: Diagnosis not present

## 2013-11-21 DIAGNOSIS — Z992 Dependence on renal dialysis: Secondary | ICD-10-CM | POA: Diagnosis not present

## 2013-11-22 DIAGNOSIS — D509 Iron deficiency anemia, unspecified: Secondary | ICD-10-CM | POA: Diagnosis not present

## 2013-11-22 DIAGNOSIS — N2581 Secondary hyperparathyroidism of renal origin: Secondary | ICD-10-CM | POA: Diagnosis not present

## 2013-11-22 DIAGNOSIS — D631 Anemia in chronic kidney disease: Secondary | ICD-10-CM | POA: Diagnosis not present

## 2013-11-22 DIAGNOSIS — Z992 Dependence on renal dialysis: Secondary | ICD-10-CM | POA: Diagnosis not present

## 2013-11-22 DIAGNOSIS — N186 End stage renal disease: Secondary | ICD-10-CM | POA: Diagnosis not present

## 2013-11-23 DIAGNOSIS — Z992 Dependence on renal dialysis: Secondary | ICD-10-CM | POA: Diagnosis not present

## 2013-11-23 DIAGNOSIS — N186 End stage renal disease: Secondary | ICD-10-CM | POA: Diagnosis not present

## 2013-11-23 DIAGNOSIS — D631 Anemia in chronic kidney disease: Secondary | ICD-10-CM | POA: Diagnosis not present

## 2013-11-23 DIAGNOSIS — D509 Iron deficiency anemia, unspecified: Secondary | ICD-10-CM | POA: Diagnosis not present

## 2013-11-23 DIAGNOSIS — N2581 Secondary hyperparathyroidism of renal origin: Secondary | ICD-10-CM | POA: Diagnosis not present

## 2013-11-23 DIAGNOSIS — N039 Chronic nephritic syndrome with unspecified morphologic changes: Secondary | ICD-10-CM | POA: Diagnosis not present

## 2013-11-24 DIAGNOSIS — D631 Anemia in chronic kidney disease: Secondary | ICD-10-CM | POA: Diagnosis not present

## 2013-11-24 DIAGNOSIS — Z992 Dependence on renal dialysis: Secondary | ICD-10-CM | POA: Diagnosis not present

## 2013-11-24 DIAGNOSIS — N2581 Secondary hyperparathyroidism of renal origin: Secondary | ICD-10-CM | POA: Diagnosis not present

## 2013-11-24 DIAGNOSIS — D509 Iron deficiency anemia, unspecified: Secondary | ICD-10-CM | POA: Diagnosis not present

## 2013-11-24 DIAGNOSIS — N186 End stage renal disease: Secondary | ICD-10-CM | POA: Diagnosis not present

## 2013-11-25 DIAGNOSIS — D509 Iron deficiency anemia, unspecified: Secondary | ICD-10-CM | POA: Diagnosis not present

## 2013-11-25 DIAGNOSIS — N2581 Secondary hyperparathyroidism of renal origin: Secondary | ICD-10-CM | POA: Diagnosis not present

## 2013-11-25 DIAGNOSIS — D631 Anemia in chronic kidney disease: Secondary | ICD-10-CM | POA: Diagnosis not present

## 2013-11-25 DIAGNOSIS — Z992 Dependence on renal dialysis: Secondary | ICD-10-CM | POA: Diagnosis not present

## 2013-11-25 DIAGNOSIS — N186 End stage renal disease: Secondary | ICD-10-CM | POA: Diagnosis not present

## 2013-11-26 DIAGNOSIS — D631 Anemia in chronic kidney disease: Secondary | ICD-10-CM | POA: Diagnosis not present

## 2013-11-26 DIAGNOSIS — N186 End stage renal disease: Secondary | ICD-10-CM | POA: Diagnosis not present

## 2013-11-26 DIAGNOSIS — D509 Iron deficiency anemia, unspecified: Secondary | ICD-10-CM | POA: Diagnosis not present

## 2013-11-26 DIAGNOSIS — Z992 Dependence on renal dialysis: Secondary | ICD-10-CM | POA: Diagnosis not present

## 2013-11-26 DIAGNOSIS — N039 Chronic nephritic syndrome with unspecified morphologic changes: Secondary | ICD-10-CM | POA: Diagnosis not present

## 2013-11-26 DIAGNOSIS — N2581 Secondary hyperparathyroidism of renal origin: Secondary | ICD-10-CM | POA: Diagnosis not present

## 2013-11-27 DIAGNOSIS — D509 Iron deficiency anemia, unspecified: Secondary | ICD-10-CM | POA: Diagnosis not present

## 2013-11-27 DIAGNOSIS — N039 Chronic nephritic syndrome with unspecified morphologic changes: Secondary | ICD-10-CM | POA: Diagnosis not present

## 2013-11-27 DIAGNOSIS — N2581 Secondary hyperparathyroidism of renal origin: Secondary | ICD-10-CM | POA: Diagnosis not present

## 2013-11-27 DIAGNOSIS — D631 Anemia in chronic kidney disease: Secondary | ICD-10-CM | POA: Diagnosis not present

## 2013-11-27 DIAGNOSIS — N186 End stage renal disease: Secondary | ICD-10-CM | POA: Diagnosis not present

## 2013-11-27 DIAGNOSIS — Z992 Dependence on renal dialysis: Secondary | ICD-10-CM | POA: Diagnosis not present

## 2013-11-28 DIAGNOSIS — D509 Iron deficiency anemia, unspecified: Secondary | ICD-10-CM | POA: Diagnosis not present

## 2013-11-28 DIAGNOSIS — Z992 Dependence on renal dialysis: Secondary | ICD-10-CM | POA: Diagnosis not present

## 2013-11-28 DIAGNOSIS — D631 Anemia in chronic kidney disease: Secondary | ICD-10-CM | POA: Diagnosis not present

## 2013-11-28 DIAGNOSIS — N039 Chronic nephritic syndrome with unspecified morphologic changes: Secondary | ICD-10-CM | POA: Diagnosis not present

## 2013-11-28 DIAGNOSIS — N2581 Secondary hyperparathyroidism of renal origin: Secondary | ICD-10-CM | POA: Diagnosis not present

## 2013-11-28 DIAGNOSIS — N186 End stage renal disease: Secondary | ICD-10-CM | POA: Diagnosis not present

## 2013-11-28 DIAGNOSIS — M549 Dorsalgia, unspecified: Secondary | ICD-10-CM | POA: Diagnosis not present

## 2013-11-29 DIAGNOSIS — D509 Iron deficiency anemia, unspecified: Secondary | ICD-10-CM | POA: Diagnosis not present

## 2013-11-29 DIAGNOSIS — N2581 Secondary hyperparathyroidism of renal origin: Secondary | ICD-10-CM | POA: Diagnosis not present

## 2013-11-29 DIAGNOSIS — D631 Anemia in chronic kidney disease: Secondary | ICD-10-CM | POA: Diagnosis not present

## 2013-11-29 DIAGNOSIS — N186 End stage renal disease: Secondary | ICD-10-CM | POA: Diagnosis not present

## 2013-11-29 DIAGNOSIS — Z992 Dependence on renal dialysis: Secondary | ICD-10-CM | POA: Diagnosis not present

## 2013-11-30 DIAGNOSIS — D509 Iron deficiency anemia, unspecified: Secondary | ICD-10-CM | POA: Diagnosis not present

## 2013-11-30 DIAGNOSIS — N186 End stage renal disease: Secondary | ICD-10-CM | POA: Diagnosis not present

## 2013-11-30 DIAGNOSIS — D631 Anemia in chronic kidney disease: Secondary | ICD-10-CM | POA: Diagnosis not present

## 2013-11-30 DIAGNOSIS — Z992 Dependence on renal dialysis: Secondary | ICD-10-CM | POA: Diagnosis not present

## 2013-11-30 DIAGNOSIS — N2581 Secondary hyperparathyroidism of renal origin: Secondary | ICD-10-CM | POA: Diagnosis not present

## 2013-12-01 DIAGNOSIS — D509 Iron deficiency anemia, unspecified: Secondary | ICD-10-CM | POA: Diagnosis not present

## 2013-12-01 DIAGNOSIS — Z992 Dependence on renal dialysis: Secondary | ICD-10-CM | POA: Diagnosis not present

## 2013-12-01 DIAGNOSIS — D631 Anemia in chronic kidney disease: Secondary | ICD-10-CM | POA: Diagnosis not present

## 2013-12-01 DIAGNOSIS — N2581 Secondary hyperparathyroidism of renal origin: Secondary | ICD-10-CM | POA: Diagnosis not present

## 2013-12-01 DIAGNOSIS — N186 End stage renal disease: Secondary | ICD-10-CM | POA: Diagnosis not present

## 2013-12-02 DIAGNOSIS — N186 End stage renal disease: Secondary | ICD-10-CM | POA: Diagnosis not present

## 2013-12-02 DIAGNOSIS — Z992 Dependence on renal dialysis: Secondary | ICD-10-CM | POA: Diagnosis not present

## 2013-12-02 DIAGNOSIS — D631 Anemia in chronic kidney disease: Secondary | ICD-10-CM | POA: Diagnosis not present

## 2013-12-02 DIAGNOSIS — D509 Iron deficiency anemia, unspecified: Secondary | ICD-10-CM | POA: Diagnosis not present

## 2013-12-02 DIAGNOSIS — N2581 Secondary hyperparathyroidism of renal origin: Secondary | ICD-10-CM | POA: Diagnosis not present

## 2013-12-03 DIAGNOSIS — Z992 Dependence on renal dialysis: Secondary | ICD-10-CM | POA: Diagnosis not present

## 2013-12-03 DIAGNOSIS — N186 End stage renal disease: Secondary | ICD-10-CM | POA: Diagnosis not present

## 2013-12-03 DIAGNOSIS — N2581 Secondary hyperparathyroidism of renal origin: Secondary | ICD-10-CM | POA: Diagnosis not present

## 2013-12-03 DIAGNOSIS — D631 Anemia in chronic kidney disease: Secondary | ICD-10-CM | POA: Diagnosis not present

## 2013-12-03 DIAGNOSIS — D509 Iron deficiency anemia, unspecified: Secondary | ICD-10-CM | POA: Diagnosis not present

## 2013-12-04 DIAGNOSIS — D631 Anemia in chronic kidney disease: Secondary | ICD-10-CM | POA: Diagnosis not present

## 2013-12-04 DIAGNOSIS — N2581 Secondary hyperparathyroidism of renal origin: Secondary | ICD-10-CM | POA: Diagnosis not present

## 2013-12-04 DIAGNOSIS — Z992 Dependence on renal dialysis: Secondary | ICD-10-CM | POA: Diagnosis not present

## 2013-12-04 DIAGNOSIS — D509 Iron deficiency anemia, unspecified: Secondary | ICD-10-CM | POA: Diagnosis not present

## 2013-12-04 DIAGNOSIS — N186 End stage renal disease: Secondary | ICD-10-CM | POA: Diagnosis not present

## 2013-12-05 DIAGNOSIS — D509 Iron deficiency anemia, unspecified: Secondary | ICD-10-CM | POA: Diagnosis not present

## 2013-12-05 DIAGNOSIS — D631 Anemia in chronic kidney disease: Secondary | ICD-10-CM | POA: Diagnosis not present

## 2013-12-05 DIAGNOSIS — N186 End stage renal disease: Secondary | ICD-10-CM | POA: Diagnosis not present

## 2013-12-05 DIAGNOSIS — Z992 Dependence on renal dialysis: Secondary | ICD-10-CM | POA: Diagnosis not present

## 2013-12-05 DIAGNOSIS — N2581 Secondary hyperparathyroidism of renal origin: Secondary | ICD-10-CM | POA: Diagnosis not present

## 2013-12-06 DIAGNOSIS — N186 End stage renal disease: Secondary | ICD-10-CM | POA: Diagnosis not present

## 2013-12-06 DIAGNOSIS — D631 Anemia in chronic kidney disease: Secondary | ICD-10-CM | POA: Diagnosis not present

## 2013-12-06 DIAGNOSIS — N2581 Secondary hyperparathyroidism of renal origin: Secondary | ICD-10-CM | POA: Diagnosis not present

## 2013-12-06 DIAGNOSIS — D509 Iron deficiency anemia, unspecified: Secondary | ICD-10-CM | POA: Diagnosis not present

## 2013-12-06 DIAGNOSIS — Z992 Dependence on renal dialysis: Secondary | ICD-10-CM | POA: Diagnosis not present

## 2013-12-07 DIAGNOSIS — D631 Anemia in chronic kidney disease: Secondary | ICD-10-CM | POA: Diagnosis not present

## 2013-12-07 DIAGNOSIS — D509 Iron deficiency anemia, unspecified: Secondary | ICD-10-CM | POA: Diagnosis not present

## 2013-12-07 DIAGNOSIS — N039 Chronic nephritic syndrome with unspecified morphologic changes: Secondary | ICD-10-CM | POA: Diagnosis not present

## 2013-12-07 DIAGNOSIS — N186 End stage renal disease: Secondary | ICD-10-CM | POA: Diagnosis not present

## 2013-12-07 DIAGNOSIS — Z992 Dependence on renal dialysis: Secondary | ICD-10-CM | POA: Diagnosis not present

## 2013-12-07 DIAGNOSIS — N2581 Secondary hyperparathyroidism of renal origin: Secondary | ICD-10-CM | POA: Diagnosis not present

## 2013-12-08 DIAGNOSIS — Z992 Dependence on renal dialysis: Secondary | ICD-10-CM | POA: Diagnosis not present

## 2013-12-08 DIAGNOSIS — D509 Iron deficiency anemia, unspecified: Secondary | ICD-10-CM | POA: Diagnosis not present

## 2013-12-08 DIAGNOSIS — N2581 Secondary hyperparathyroidism of renal origin: Secondary | ICD-10-CM | POA: Diagnosis not present

## 2013-12-08 DIAGNOSIS — N186 End stage renal disease: Secondary | ICD-10-CM | POA: Diagnosis not present

## 2013-12-08 DIAGNOSIS — D631 Anemia in chronic kidney disease: Secondary | ICD-10-CM | POA: Diagnosis not present

## 2013-12-09 DIAGNOSIS — N2581 Secondary hyperparathyroidism of renal origin: Secondary | ICD-10-CM | POA: Diagnosis not present

## 2013-12-09 DIAGNOSIS — N186 End stage renal disease: Secondary | ICD-10-CM | POA: Diagnosis not present

## 2013-12-09 DIAGNOSIS — Z992 Dependence on renal dialysis: Secondary | ICD-10-CM | POA: Diagnosis not present

## 2013-12-09 DIAGNOSIS — N039 Chronic nephritic syndrome with unspecified morphologic changes: Secondary | ICD-10-CM | POA: Diagnosis not present

## 2013-12-09 DIAGNOSIS — D509 Iron deficiency anemia, unspecified: Secondary | ICD-10-CM | POA: Diagnosis not present

## 2013-12-09 DIAGNOSIS — D631 Anemia in chronic kidney disease: Secondary | ICD-10-CM | POA: Diagnosis not present

## 2013-12-10 DIAGNOSIS — Z992 Dependence on renal dialysis: Secondary | ICD-10-CM | POA: Diagnosis not present

## 2013-12-10 DIAGNOSIS — N2581 Secondary hyperparathyroidism of renal origin: Secondary | ICD-10-CM | POA: Diagnosis not present

## 2013-12-10 DIAGNOSIS — D509 Iron deficiency anemia, unspecified: Secondary | ICD-10-CM | POA: Diagnosis not present

## 2013-12-10 DIAGNOSIS — D631 Anemia in chronic kidney disease: Secondary | ICD-10-CM | POA: Diagnosis not present

## 2013-12-10 DIAGNOSIS — N186 End stage renal disease: Secondary | ICD-10-CM | POA: Diagnosis not present

## 2013-12-11 DIAGNOSIS — D509 Iron deficiency anemia, unspecified: Secondary | ICD-10-CM | POA: Diagnosis not present

## 2013-12-11 DIAGNOSIS — N186 End stage renal disease: Secondary | ICD-10-CM | POA: Diagnosis not present

## 2013-12-11 DIAGNOSIS — N039 Chronic nephritic syndrome with unspecified morphologic changes: Secondary | ICD-10-CM | POA: Diagnosis not present

## 2013-12-11 DIAGNOSIS — N2581 Secondary hyperparathyroidism of renal origin: Secondary | ICD-10-CM | POA: Diagnosis not present

## 2013-12-11 DIAGNOSIS — D631 Anemia in chronic kidney disease: Secondary | ICD-10-CM | POA: Diagnosis not present

## 2013-12-11 DIAGNOSIS — Z992 Dependence on renal dialysis: Secondary | ICD-10-CM | POA: Diagnosis not present

## 2013-12-12 DIAGNOSIS — N2581 Secondary hyperparathyroidism of renal origin: Secondary | ICD-10-CM | POA: Diagnosis not present

## 2013-12-12 DIAGNOSIS — N186 End stage renal disease: Secondary | ICD-10-CM | POA: Diagnosis not present

## 2013-12-12 DIAGNOSIS — D631 Anemia in chronic kidney disease: Secondary | ICD-10-CM | POA: Diagnosis not present

## 2013-12-12 DIAGNOSIS — Z992 Dependence on renal dialysis: Secondary | ICD-10-CM | POA: Diagnosis not present

## 2013-12-12 DIAGNOSIS — D509 Iron deficiency anemia, unspecified: Secondary | ICD-10-CM | POA: Diagnosis not present

## 2013-12-13 DIAGNOSIS — N186 End stage renal disease: Secondary | ICD-10-CM | POA: Diagnosis not present

## 2013-12-13 DIAGNOSIS — Z992 Dependence on renal dialysis: Secondary | ICD-10-CM | POA: Diagnosis not present

## 2013-12-13 DIAGNOSIS — D509 Iron deficiency anemia, unspecified: Secondary | ICD-10-CM | POA: Diagnosis not present

## 2013-12-13 DIAGNOSIS — D631 Anemia in chronic kidney disease: Secondary | ICD-10-CM | POA: Diagnosis not present

## 2013-12-13 DIAGNOSIS — N039 Chronic nephritic syndrome with unspecified morphologic changes: Secondary | ICD-10-CM | POA: Diagnosis not present

## 2013-12-13 DIAGNOSIS — N2581 Secondary hyperparathyroidism of renal origin: Secondary | ICD-10-CM | POA: Diagnosis not present

## 2013-12-14 DIAGNOSIS — N039 Chronic nephritic syndrome with unspecified morphologic changes: Secondary | ICD-10-CM | POA: Diagnosis not present

## 2013-12-14 DIAGNOSIS — D509 Iron deficiency anemia, unspecified: Secondary | ICD-10-CM | POA: Diagnosis not present

## 2013-12-14 DIAGNOSIS — Z992 Dependence on renal dialysis: Secondary | ICD-10-CM | POA: Diagnosis not present

## 2013-12-14 DIAGNOSIS — N186 End stage renal disease: Secondary | ICD-10-CM | POA: Diagnosis not present

## 2013-12-14 DIAGNOSIS — D631 Anemia in chronic kidney disease: Secondary | ICD-10-CM | POA: Diagnosis not present

## 2013-12-14 DIAGNOSIS — N2581 Secondary hyperparathyroidism of renal origin: Secondary | ICD-10-CM | POA: Diagnosis not present

## 2013-12-15 DIAGNOSIS — N2581 Secondary hyperparathyroidism of renal origin: Secondary | ICD-10-CM | POA: Diagnosis not present

## 2013-12-15 DIAGNOSIS — Z992 Dependence on renal dialysis: Secondary | ICD-10-CM | POA: Diagnosis not present

## 2013-12-15 DIAGNOSIS — N186 End stage renal disease: Secondary | ICD-10-CM | POA: Diagnosis not present

## 2013-12-15 DIAGNOSIS — D509 Iron deficiency anemia, unspecified: Secondary | ICD-10-CM | POA: Diagnosis not present

## 2013-12-15 DIAGNOSIS — D631 Anemia in chronic kidney disease: Secondary | ICD-10-CM | POA: Diagnosis not present

## 2013-12-16 DIAGNOSIS — N186 End stage renal disease: Secondary | ICD-10-CM | POA: Diagnosis not present

## 2013-12-16 DIAGNOSIS — N2581 Secondary hyperparathyroidism of renal origin: Secondary | ICD-10-CM | POA: Diagnosis not present

## 2013-12-16 DIAGNOSIS — D509 Iron deficiency anemia, unspecified: Secondary | ICD-10-CM | POA: Diagnosis not present

## 2013-12-16 DIAGNOSIS — Z992 Dependence on renal dialysis: Secondary | ICD-10-CM | POA: Diagnosis not present

## 2013-12-16 DIAGNOSIS — D631 Anemia in chronic kidney disease: Secondary | ICD-10-CM | POA: Diagnosis not present

## 2013-12-16 DIAGNOSIS — N039 Chronic nephritic syndrome with unspecified morphologic changes: Secondary | ICD-10-CM | POA: Diagnosis not present

## 2013-12-17 DIAGNOSIS — N186 End stage renal disease: Secondary | ICD-10-CM | POA: Diagnosis not present

## 2013-12-17 DIAGNOSIS — Z992 Dependence on renal dialysis: Secondary | ICD-10-CM | POA: Diagnosis not present

## 2013-12-17 DIAGNOSIS — D509 Iron deficiency anemia, unspecified: Secondary | ICD-10-CM | POA: Diagnosis not present

## 2013-12-17 DIAGNOSIS — N2581 Secondary hyperparathyroidism of renal origin: Secondary | ICD-10-CM | POA: Diagnosis not present

## 2013-12-17 DIAGNOSIS — D631 Anemia in chronic kidney disease: Secondary | ICD-10-CM | POA: Diagnosis not present

## 2013-12-18 DIAGNOSIS — N039 Chronic nephritic syndrome with unspecified morphologic changes: Secondary | ICD-10-CM | POA: Diagnosis not present

## 2013-12-18 DIAGNOSIS — D631 Anemia in chronic kidney disease: Secondary | ICD-10-CM | POA: Diagnosis not present

## 2013-12-18 DIAGNOSIS — D509 Iron deficiency anemia, unspecified: Secondary | ICD-10-CM | POA: Diagnosis not present

## 2013-12-18 DIAGNOSIS — N186 End stage renal disease: Secondary | ICD-10-CM | POA: Diagnosis not present

## 2014-01-18 DIAGNOSIS — D509 Iron deficiency anemia, unspecified: Secondary | ICD-10-CM | POA: Diagnosis not present

## 2014-01-18 DIAGNOSIS — D631 Anemia in chronic kidney disease: Secondary | ICD-10-CM | POA: Diagnosis not present

## 2014-01-18 DIAGNOSIS — N186 End stage renal disease: Secondary | ICD-10-CM | POA: Diagnosis not present

## 2014-01-19 DIAGNOSIS — D631 Anemia in chronic kidney disease: Secondary | ICD-10-CM | POA: Diagnosis not present

## 2014-01-19 DIAGNOSIS — N186 End stage renal disease: Secondary | ICD-10-CM | POA: Diagnosis not present

## 2014-01-19 DIAGNOSIS — D509 Iron deficiency anemia, unspecified: Secondary | ICD-10-CM | POA: Diagnosis not present

## 2014-01-20 DIAGNOSIS — N186 End stage renal disease: Secondary | ICD-10-CM | POA: Diagnosis not present

## 2014-01-20 DIAGNOSIS — D631 Anemia in chronic kidney disease: Secondary | ICD-10-CM | POA: Diagnosis not present

## 2014-01-20 DIAGNOSIS — D509 Iron deficiency anemia, unspecified: Secondary | ICD-10-CM | POA: Diagnosis not present

## 2014-01-21 DIAGNOSIS — D509 Iron deficiency anemia, unspecified: Secondary | ICD-10-CM | POA: Diagnosis not present

## 2014-01-21 DIAGNOSIS — N186 End stage renal disease: Secondary | ICD-10-CM | POA: Diagnosis not present

## 2014-01-21 DIAGNOSIS — N039 Chronic nephritic syndrome with unspecified morphologic changes: Secondary | ICD-10-CM | POA: Diagnosis not present

## 2014-01-21 DIAGNOSIS — D631 Anemia in chronic kidney disease: Secondary | ICD-10-CM | POA: Diagnosis not present

## 2014-01-22 DIAGNOSIS — N186 End stage renal disease: Secondary | ICD-10-CM | POA: Diagnosis not present

## 2014-01-22 DIAGNOSIS — D631 Anemia in chronic kidney disease: Secondary | ICD-10-CM | POA: Diagnosis not present

## 2014-01-22 DIAGNOSIS — N039 Chronic nephritic syndrome with unspecified morphologic changes: Secondary | ICD-10-CM | POA: Diagnosis not present

## 2014-01-22 DIAGNOSIS — D509 Iron deficiency anemia, unspecified: Secondary | ICD-10-CM | POA: Diagnosis not present

## 2014-01-23 DIAGNOSIS — N186 End stage renal disease: Secondary | ICD-10-CM | POA: Diagnosis not present

## 2014-01-23 DIAGNOSIS — D631 Anemia in chronic kidney disease: Secondary | ICD-10-CM | POA: Diagnosis not present

## 2014-01-23 DIAGNOSIS — D509 Iron deficiency anemia, unspecified: Secondary | ICD-10-CM | POA: Diagnosis not present

## 2014-01-24 DIAGNOSIS — D631 Anemia in chronic kidney disease: Secondary | ICD-10-CM | POA: Diagnosis not present

## 2014-01-24 DIAGNOSIS — D509 Iron deficiency anemia, unspecified: Secondary | ICD-10-CM | POA: Diagnosis not present

## 2014-01-24 DIAGNOSIS — N186 End stage renal disease: Secondary | ICD-10-CM | POA: Diagnosis not present

## 2014-01-25 DIAGNOSIS — D509 Iron deficiency anemia, unspecified: Secondary | ICD-10-CM | POA: Diagnosis not present

## 2014-01-25 DIAGNOSIS — D631 Anemia in chronic kidney disease: Secondary | ICD-10-CM | POA: Diagnosis not present

## 2014-01-25 DIAGNOSIS — N186 End stage renal disease: Secondary | ICD-10-CM | POA: Diagnosis not present

## 2014-01-26 DIAGNOSIS — N186 End stage renal disease: Secondary | ICD-10-CM | POA: Diagnosis not present

## 2014-01-26 DIAGNOSIS — I10 Essential (primary) hypertension: Secondary | ICD-10-CM | POA: Diagnosis not present

## 2014-01-26 DIAGNOSIS — D509 Iron deficiency anemia, unspecified: Secondary | ICD-10-CM | POA: Diagnosis not present

## 2014-01-26 DIAGNOSIS — D631 Anemia in chronic kidney disease: Secondary | ICD-10-CM | POA: Diagnosis not present

## 2014-01-27 DIAGNOSIS — D509 Iron deficiency anemia, unspecified: Secondary | ICD-10-CM | POA: Diagnosis not present

## 2014-01-27 DIAGNOSIS — N186 End stage renal disease: Secondary | ICD-10-CM | POA: Diagnosis not present

## 2014-01-27 DIAGNOSIS — D631 Anemia in chronic kidney disease: Secondary | ICD-10-CM | POA: Diagnosis not present

## 2014-01-28 DIAGNOSIS — D509 Iron deficiency anemia, unspecified: Secondary | ICD-10-CM | POA: Diagnosis not present

## 2014-01-28 DIAGNOSIS — D631 Anemia in chronic kidney disease: Secondary | ICD-10-CM | POA: Diagnosis not present

## 2014-01-28 DIAGNOSIS — N186 End stage renal disease: Secondary | ICD-10-CM | POA: Diagnosis not present

## 2014-01-29 DIAGNOSIS — N186 End stage renal disease: Secondary | ICD-10-CM | POA: Diagnosis not present

## 2014-01-29 DIAGNOSIS — D509 Iron deficiency anemia, unspecified: Secondary | ICD-10-CM | POA: Diagnosis not present

## 2014-01-29 DIAGNOSIS — D631 Anemia in chronic kidney disease: Secondary | ICD-10-CM | POA: Diagnosis not present

## 2014-01-30 DIAGNOSIS — D509 Iron deficiency anemia, unspecified: Secondary | ICD-10-CM | POA: Diagnosis not present

## 2014-01-30 DIAGNOSIS — N186 End stage renal disease: Secondary | ICD-10-CM | POA: Diagnosis not present

## 2014-01-30 DIAGNOSIS — N039 Chronic nephritic syndrome with unspecified morphologic changes: Secondary | ICD-10-CM | POA: Diagnosis not present

## 2014-01-30 DIAGNOSIS — D631 Anemia in chronic kidney disease: Secondary | ICD-10-CM | POA: Diagnosis not present

## 2014-01-31 DIAGNOSIS — N186 End stage renal disease: Secondary | ICD-10-CM | POA: Diagnosis not present

## 2014-01-31 DIAGNOSIS — D631 Anemia in chronic kidney disease: Secondary | ICD-10-CM | POA: Diagnosis not present

## 2014-01-31 DIAGNOSIS — D509 Iron deficiency anemia, unspecified: Secondary | ICD-10-CM | POA: Diagnosis not present

## 2014-02-01 DIAGNOSIS — N186 End stage renal disease: Secondary | ICD-10-CM | POA: Diagnosis not present

## 2014-02-01 DIAGNOSIS — D509 Iron deficiency anemia, unspecified: Secondary | ICD-10-CM | POA: Diagnosis not present

## 2014-02-01 DIAGNOSIS — N039 Chronic nephritic syndrome with unspecified morphologic changes: Secondary | ICD-10-CM | POA: Diagnosis not present

## 2014-02-01 DIAGNOSIS — D631 Anemia in chronic kidney disease: Secondary | ICD-10-CM | POA: Diagnosis not present

## 2014-02-02 DIAGNOSIS — D509 Iron deficiency anemia, unspecified: Secondary | ICD-10-CM | POA: Diagnosis not present

## 2014-02-02 DIAGNOSIS — D631 Anemia in chronic kidney disease: Secondary | ICD-10-CM | POA: Diagnosis not present

## 2014-02-02 DIAGNOSIS — N039 Chronic nephritic syndrome with unspecified morphologic changes: Secondary | ICD-10-CM | POA: Diagnosis not present

## 2014-02-02 DIAGNOSIS — N186 End stage renal disease: Secondary | ICD-10-CM | POA: Diagnosis not present

## 2014-02-03 DIAGNOSIS — D509 Iron deficiency anemia, unspecified: Secondary | ICD-10-CM | POA: Diagnosis not present

## 2014-02-03 DIAGNOSIS — D631 Anemia in chronic kidney disease: Secondary | ICD-10-CM | POA: Diagnosis not present

## 2014-02-03 DIAGNOSIS — N186 End stage renal disease: Secondary | ICD-10-CM | POA: Diagnosis not present

## 2014-02-04 DIAGNOSIS — D631 Anemia in chronic kidney disease: Secondary | ICD-10-CM | POA: Diagnosis not present

## 2014-02-04 DIAGNOSIS — D509 Iron deficiency anemia, unspecified: Secondary | ICD-10-CM | POA: Diagnosis not present

## 2014-02-04 DIAGNOSIS — N039 Chronic nephritic syndrome with unspecified morphologic changes: Secondary | ICD-10-CM | POA: Diagnosis not present

## 2014-02-04 DIAGNOSIS — N186 End stage renal disease: Secondary | ICD-10-CM | POA: Diagnosis not present

## 2014-02-05 DIAGNOSIS — D509 Iron deficiency anemia, unspecified: Secondary | ICD-10-CM | POA: Diagnosis not present

## 2014-02-05 DIAGNOSIS — N039 Chronic nephritic syndrome with unspecified morphologic changes: Secondary | ICD-10-CM | POA: Diagnosis not present

## 2014-02-05 DIAGNOSIS — D631 Anemia in chronic kidney disease: Secondary | ICD-10-CM | POA: Diagnosis not present

## 2014-02-05 DIAGNOSIS — N186 End stage renal disease: Secondary | ICD-10-CM | POA: Diagnosis not present

## 2014-02-06 DIAGNOSIS — N186 End stage renal disease: Secondary | ICD-10-CM | POA: Diagnosis not present

## 2014-02-06 DIAGNOSIS — D509 Iron deficiency anemia, unspecified: Secondary | ICD-10-CM | POA: Diagnosis not present

## 2014-02-06 DIAGNOSIS — D631 Anemia in chronic kidney disease: Secondary | ICD-10-CM | POA: Diagnosis not present

## 2014-02-07 DIAGNOSIS — N039 Chronic nephritic syndrome with unspecified morphologic changes: Secondary | ICD-10-CM | POA: Diagnosis not present

## 2014-02-07 DIAGNOSIS — D509 Iron deficiency anemia, unspecified: Secondary | ICD-10-CM | POA: Diagnosis not present

## 2014-02-07 DIAGNOSIS — N186 End stage renal disease: Secondary | ICD-10-CM | POA: Diagnosis not present

## 2014-02-07 DIAGNOSIS — D631 Anemia in chronic kidney disease: Secondary | ICD-10-CM | POA: Diagnosis not present

## 2014-02-08 DIAGNOSIS — D631 Anemia in chronic kidney disease: Secondary | ICD-10-CM | POA: Diagnosis not present

## 2014-02-08 DIAGNOSIS — D509 Iron deficiency anemia, unspecified: Secondary | ICD-10-CM | POA: Diagnosis not present

## 2014-02-08 DIAGNOSIS — N186 End stage renal disease: Secondary | ICD-10-CM | POA: Diagnosis not present

## 2014-02-09 DIAGNOSIS — D631 Anemia in chronic kidney disease: Secondary | ICD-10-CM | POA: Diagnosis not present

## 2014-02-09 DIAGNOSIS — D509 Iron deficiency anemia, unspecified: Secondary | ICD-10-CM | POA: Diagnosis not present

## 2014-02-09 DIAGNOSIS — N186 End stage renal disease: Secondary | ICD-10-CM | POA: Diagnosis not present

## 2014-02-09 DIAGNOSIS — N039 Chronic nephritic syndrome with unspecified morphologic changes: Secondary | ICD-10-CM | POA: Diagnosis not present

## 2014-02-10 DIAGNOSIS — D509 Iron deficiency anemia, unspecified: Secondary | ICD-10-CM | POA: Diagnosis not present

## 2014-02-10 DIAGNOSIS — D631 Anemia in chronic kidney disease: Secondary | ICD-10-CM | POA: Diagnosis not present

## 2014-02-10 DIAGNOSIS — N186 End stage renal disease: Secondary | ICD-10-CM | POA: Diagnosis not present

## 2014-02-11 DIAGNOSIS — D631 Anemia in chronic kidney disease: Secondary | ICD-10-CM | POA: Diagnosis not present

## 2014-02-11 DIAGNOSIS — N186 End stage renal disease: Secondary | ICD-10-CM | POA: Diagnosis not present

## 2014-02-11 DIAGNOSIS — D509 Iron deficiency anemia, unspecified: Secondary | ICD-10-CM | POA: Diagnosis not present

## 2014-02-12 DIAGNOSIS — D509 Iron deficiency anemia, unspecified: Secondary | ICD-10-CM | POA: Diagnosis not present

## 2014-02-12 DIAGNOSIS — N186 End stage renal disease: Secondary | ICD-10-CM | POA: Diagnosis not present

## 2014-02-12 DIAGNOSIS — D631 Anemia in chronic kidney disease: Secondary | ICD-10-CM | POA: Diagnosis not present

## 2014-02-13 DIAGNOSIS — D631 Anemia in chronic kidney disease: Secondary | ICD-10-CM | POA: Diagnosis not present

## 2014-02-13 DIAGNOSIS — D509 Iron deficiency anemia, unspecified: Secondary | ICD-10-CM | POA: Diagnosis not present

## 2014-02-13 DIAGNOSIS — Z5181 Encounter for therapeutic drug level monitoring: Secondary | ICD-10-CM | POA: Diagnosis not present

## 2014-02-13 DIAGNOSIS — Z7901 Long term (current) use of anticoagulants: Secondary | ICD-10-CM | POA: Diagnosis not present

## 2014-02-13 DIAGNOSIS — N186 End stage renal disease: Secondary | ICD-10-CM | POA: Diagnosis not present

## 2014-02-14 DIAGNOSIS — D509 Iron deficiency anemia, unspecified: Secondary | ICD-10-CM | POA: Diagnosis not present

## 2014-02-14 DIAGNOSIS — N186 End stage renal disease: Secondary | ICD-10-CM | POA: Diagnosis not present

## 2014-02-14 DIAGNOSIS — D631 Anemia in chronic kidney disease: Secondary | ICD-10-CM | POA: Diagnosis not present

## 2014-02-15 DIAGNOSIS — N186 End stage renal disease: Secondary | ICD-10-CM | POA: Diagnosis not present

## 2014-02-15 DIAGNOSIS — D631 Anemia in chronic kidney disease: Secondary | ICD-10-CM | POA: Diagnosis not present

## 2014-02-15 DIAGNOSIS — D509 Iron deficiency anemia, unspecified: Secondary | ICD-10-CM | POA: Diagnosis not present

## 2014-02-16 DIAGNOSIS — N186 End stage renal disease: Secondary | ICD-10-CM | POA: Diagnosis not present

## 2014-02-16 DIAGNOSIS — D631 Anemia in chronic kidney disease: Secondary | ICD-10-CM | POA: Diagnosis not present

## 2014-02-16 DIAGNOSIS — D509 Iron deficiency anemia, unspecified: Secondary | ICD-10-CM | POA: Diagnosis not present

## 2014-02-17 DIAGNOSIS — D509 Iron deficiency anemia, unspecified: Secondary | ICD-10-CM | POA: Diagnosis not present

## 2014-02-17 DIAGNOSIS — D631 Anemia in chronic kidney disease: Secondary | ICD-10-CM | POA: Diagnosis not present

## 2014-02-17 DIAGNOSIS — N2581 Secondary hyperparathyroidism of renal origin: Secondary | ICD-10-CM | POA: Diagnosis not present

## 2014-02-17 DIAGNOSIS — N186 End stage renal disease: Secondary | ICD-10-CM | POA: Diagnosis not present

## 2014-02-18 DIAGNOSIS — D631 Anemia in chronic kidney disease: Secondary | ICD-10-CM | POA: Diagnosis not present

## 2014-02-18 DIAGNOSIS — D509 Iron deficiency anemia, unspecified: Secondary | ICD-10-CM | POA: Diagnosis not present

## 2014-02-18 DIAGNOSIS — N2581 Secondary hyperparathyroidism of renal origin: Secondary | ICD-10-CM | POA: Diagnosis not present

## 2014-02-18 DIAGNOSIS — N186 End stage renal disease: Secondary | ICD-10-CM | POA: Diagnosis not present

## 2014-02-18 DIAGNOSIS — N039 Chronic nephritic syndrome with unspecified morphologic changes: Secondary | ICD-10-CM | POA: Diagnosis not present

## 2014-02-19 DIAGNOSIS — D631 Anemia in chronic kidney disease: Secondary | ICD-10-CM | POA: Diagnosis not present

## 2014-02-19 DIAGNOSIS — N186 End stage renal disease: Secondary | ICD-10-CM | POA: Diagnosis not present

## 2014-02-19 DIAGNOSIS — N2581 Secondary hyperparathyroidism of renal origin: Secondary | ICD-10-CM | POA: Diagnosis not present

## 2014-02-19 DIAGNOSIS — D509 Iron deficiency anemia, unspecified: Secondary | ICD-10-CM | POA: Diagnosis not present

## 2014-02-20 DIAGNOSIS — N186 End stage renal disease: Secondary | ICD-10-CM | POA: Diagnosis not present

## 2014-02-20 DIAGNOSIS — N2581 Secondary hyperparathyroidism of renal origin: Secondary | ICD-10-CM | POA: Diagnosis not present

## 2014-02-20 DIAGNOSIS — D631 Anemia in chronic kidney disease: Secondary | ICD-10-CM | POA: Diagnosis not present

## 2014-02-20 DIAGNOSIS — D509 Iron deficiency anemia, unspecified: Secondary | ICD-10-CM | POA: Diagnosis not present

## 2014-02-21 DIAGNOSIS — N186 End stage renal disease: Secondary | ICD-10-CM | POA: Diagnosis not present

## 2014-02-21 DIAGNOSIS — N2581 Secondary hyperparathyroidism of renal origin: Secondary | ICD-10-CM | POA: Diagnosis not present

## 2014-02-21 DIAGNOSIS — D631 Anemia in chronic kidney disease: Secondary | ICD-10-CM | POA: Diagnosis not present

## 2014-02-21 DIAGNOSIS — D509 Iron deficiency anemia, unspecified: Secondary | ICD-10-CM | POA: Diagnosis not present

## 2014-02-22 DIAGNOSIS — D509 Iron deficiency anemia, unspecified: Secondary | ICD-10-CM | POA: Diagnosis not present

## 2014-02-22 DIAGNOSIS — D631 Anemia in chronic kidney disease: Secondary | ICD-10-CM | POA: Diagnosis not present

## 2014-02-22 DIAGNOSIS — N2581 Secondary hyperparathyroidism of renal origin: Secondary | ICD-10-CM | POA: Diagnosis not present

## 2014-02-22 DIAGNOSIS — N186 End stage renal disease: Secondary | ICD-10-CM | POA: Diagnosis not present

## 2014-02-23 DIAGNOSIS — N2581 Secondary hyperparathyroidism of renal origin: Secondary | ICD-10-CM | POA: Diagnosis not present

## 2014-02-23 DIAGNOSIS — D509 Iron deficiency anemia, unspecified: Secondary | ICD-10-CM | POA: Diagnosis not present

## 2014-02-23 DIAGNOSIS — N186 End stage renal disease: Secondary | ICD-10-CM | POA: Diagnosis not present

## 2014-02-23 DIAGNOSIS — D631 Anemia in chronic kidney disease: Secondary | ICD-10-CM | POA: Diagnosis not present

## 2014-02-24 DIAGNOSIS — N039 Chronic nephritic syndrome with unspecified morphologic changes: Secondary | ICD-10-CM | POA: Diagnosis not present

## 2014-02-24 DIAGNOSIS — N2581 Secondary hyperparathyroidism of renal origin: Secondary | ICD-10-CM | POA: Diagnosis not present

## 2014-02-24 DIAGNOSIS — N186 End stage renal disease: Secondary | ICD-10-CM | POA: Diagnosis not present

## 2014-02-24 DIAGNOSIS — D509 Iron deficiency anemia, unspecified: Secondary | ICD-10-CM | POA: Diagnosis not present

## 2014-02-24 DIAGNOSIS — D631 Anemia in chronic kidney disease: Secondary | ICD-10-CM | POA: Diagnosis not present

## 2014-02-25 DIAGNOSIS — N2581 Secondary hyperparathyroidism of renal origin: Secondary | ICD-10-CM | POA: Diagnosis not present

## 2014-02-25 DIAGNOSIS — N186 End stage renal disease: Secondary | ICD-10-CM | POA: Diagnosis not present

## 2014-02-25 DIAGNOSIS — D509 Iron deficiency anemia, unspecified: Secondary | ICD-10-CM | POA: Diagnosis not present

## 2014-02-25 DIAGNOSIS — D631 Anemia in chronic kidney disease: Secondary | ICD-10-CM | POA: Diagnosis not present

## 2014-02-26 DIAGNOSIS — N039 Chronic nephritic syndrome with unspecified morphologic changes: Secondary | ICD-10-CM | POA: Diagnosis not present

## 2014-02-26 DIAGNOSIS — D631 Anemia in chronic kidney disease: Secondary | ICD-10-CM | POA: Diagnosis not present

## 2014-02-26 DIAGNOSIS — N2581 Secondary hyperparathyroidism of renal origin: Secondary | ICD-10-CM | POA: Diagnosis not present

## 2014-02-26 DIAGNOSIS — N186 End stage renal disease: Secondary | ICD-10-CM | POA: Diagnosis not present

## 2014-02-26 DIAGNOSIS — D509 Iron deficiency anemia, unspecified: Secondary | ICD-10-CM | POA: Diagnosis not present

## 2014-02-27 DIAGNOSIS — D631 Anemia in chronic kidney disease: Secondary | ICD-10-CM | POA: Diagnosis not present

## 2014-02-27 DIAGNOSIS — N2581 Secondary hyperparathyroidism of renal origin: Secondary | ICD-10-CM | POA: Diagnosis not present

## 2014-02-27 DIAGNOSIS — N186 End stage renal disease: Secondary | ICD-10-CM | POA: Diagnosis not present

## 2014-02-27 DIAGNOSIS — D509 Iron deficiency anemia, unspecified: Secondary | ICD-10-CM | POA: Diagnosis not present

## 2014-02-28 DIAGNOSIS — N039 Chronic nephritic syndrome with unspecified morphologic changes: Secondary | ICD-10-CM | POA: Diagnosis not present

## 2014-02-28 DIAGNOSIS — N186 End stage renal disease: Secondary | ICD-10-CM | POA: Diagnosis not present

## 2014-02-28 DIAGNOSIS — D631 Anemia in chronic kidney disease: Secondary | ICD-10-CM | POA: Diagnosis not present

## 2014-02-28 DIAGNOSIS — D509 Iron deficiency anemia, unspecified: Secondary | ICD-10-CM | POA: Diagnosis not present

## 2014-02-28 DIAGNOSIS — N2581 Secondary hyperparathyroidism of renal origin: Secondary | ICD-10-CM | POA: Diagnosis not present

## 2014-03-01 DIAGNOSIS — D509 Iron deficiency anemia, unspecified: Secondary | ICD-10-CM | POA: Diagnosis not present

## 2014-03-01 DIAGNOSIS — D631 Anemia in chronic kidney disease: Secondary | ICD-10-CM | POA: Diagnosis not present

## 2014-03-01 DIAGNOSIS — N2581 Secondary hyperparathyroidism of renal origin: Secondary | ICD-10-CM | POA: Diagnosis not present

## 2014-03-01 DIAGNOSIS — N186 End stage renal disease: Secondary | ICD-10-CM | POA: Diagnosis not present

## 2014-03-01 DIAGNOSIS — N039 Chronic nephritic syndrome with unspecified morphologic changes: Secondary | ICD-10-CM | POA: Diagnosis not present

## 2014-03-02 DIAGNOSIS — N186 End stage renal disease: Secondary | ICD-10-CM | POA: Diagnosis not present

## 2014-03-02 DIAGNOSIS — D509 Iron deficiency anemia, unspecified: Secondary | ICD-10-CM | POA: Diagnosis not present

## 2014-03-02 DIAGNOSIS — D631 Anemia in chronic kidney disease: Secondary | ICD-10-CM | POA: Diagnosis not present

## 2014-03-02 DIAGNOSIS — N039 Chronic nephritic syndrome with unspecified morphologic changes: Secondary | ICD-10-CM | POA: Diagnosis not present

## 2014-03-02 DIAGNOSIS — Z992 Dependence on renal dialysis: Secondary | ICD-10-CM | POA: Diagnosis not present

## 2014-03-02 DIAGNOSIS — N2581 Secondary hyperparathyroidism of renal origin: Secondary | ICD-10-CM | POA: Diagnosis not present

## 2014-03-03 DIAGNOSIS — N2581 Secondary hyperparathyroidism of renal origin: Secondary | ICD-10-CM | POA: Diagnosis not present

## 2014-03-03 DIAGNOSIS — D509 Iron deficiency anemia, unspecified: Secondary | ICD-10-CM | POA: Diagnosis not present

## 2014-03-03 DIAGNOSIS — N186 End stage renal disease: Secondary | ICD-10-CM | POA: Diagnosis not present

## 2014-03-03 DIAGNOSIS — D631 Anemia in chronic kidney disease: Secondary | ICD-10-CM | POA: Diagnosis not present

## 2014-03-04 DIAGNOSIS — D509 Iron deficiency anemia, unspecified: Secondary | ICD-10-CM | POA: Diagnosis not present

## 2014-03-04 DIAGNOSIS — N2581 Secondary hyperparathyroidism of renal origin: Secondary | ICD-10-CM | POA: Diagnosis not present

## 2014-03-04 DIAGNOSIS — N186 End stage renal disease: Secondary | ICD-10-CM | POA: Diagnosis not present

## 2014-03-04 DIAGNOSIS — D631 Anemia in chronic kidney disease: Secondary | ICD-10-CM | POA: Diagnosis not present

## 2014-03-04 DIAGNOSIS — N039 Chronic nephritic syndrome with unspecified morphologic changes: Secondary | ICD-10-CM | POA: Diagnosis not present

## 2014-03-05 DIAGNOSIS — N039 Chronic nephritic syndrome with unspecified morphologic changes: Secondary | ICD-10-CM | POA: Diagnosis not present

## 2014-03-05 DIAGNOSIS — D509 Iron deficiency anemia, unspecified: Secondary | ICD-10-CM | POA: Diagnosis not present

## 2014-03-05 DIAGNOSIS — N2581 Secondary hyperparathyroidism of renal origin: Secondary | ICD-10-CM | POA: Diagnosis not present

## 2014-03-05 DIAGNOSIS — D631 Anemia in chronic kidney disease: Secondary | ICD-10-CM | POA: Diagnosis not present

## 2014-03-05 DIAGNOSIS — N186 End stage renal disease: Secondary | ICD-10-CM | POA: Diagnosis not present

## 2014-03-06 DIAGNOSIS — D509 Iron deficiency anemia, unspecified: Secondary | ICD-10-CM | POA: Diagnosis not present

## 2014-03-06 DIAGNOSIS — N186 End stage renal disease: Secondary | ICD-10-CM | POA: Diagnosis not present

## 2014-03-06 DIAGNOSIS — N2581 Secondary hyperparathyroidism of renal origin: Secondary | ICD-10-CM | POA: Diagnosis not present

## 2014-03-06 DIAGNOSIS — N039 Chronic nephritic syndrome with unspecified morphologic changes: Secondary | ICD-10-CM | POA: Diagnosis not present

## 2014-03-06 DIAGNOSIS — D631 Anemia in chronic kidney disease: Secondary | ICD-10-CM | POA: Diagnosis not present

## 2014-03-07 DIAGNOSIS — N2581 Secondary hyperparathyroidism of renal origin: Secondary | ICD-10-CM | POA: Diagnosis not present

## 2014-03-07 DIAGNOSIS — N186 End stage renal disease: Secondary | ICD-10-CM | POA: Diagnosis not present

## 2014-03-07 DIAGNOSIS — D509 Iron deficiency anemia, unspecified: Secondary | ICD-10-CM | POA: Diagnosis not present

## 2014-03-07 DIAGNOSIS — N039 Chronic nephritic syndrome with unspecified morphologic changes: Secondary | ICD-10-CM | POA: Diagnosis not present

## 2014-03-07 DIAGNOSIS — D631 Anemia in chronic kidney disease: Secondary | ICD-10-CM | POA: Diagnosis not present

## 2014-03-08 DIAGNOSIS — N186 End stage renal disease: Secondary | ICD-10-CM | POA: Diagnosis not present

## 2014-03-08 DIAGNOSIS — D631 Anemia in chronic kidney disease: Secondary | ICD-10-CM | POA: Diagnosis not present

## 2014-03-08 DIAGNOSIS — D509 Iron deficiency anemia, unspecified: Secondary | ICD-10-CM | POA: Diagnosis not present

## 2014-03-08 DIAGNOSIS — N2581 Secondary hyperparathyroidism of renal origin: Secondary | ICD-10-CM | POA: Diagnosis not present

## 2014-03-09 DIAGNOSIS — N186 End stage renal disease: Secondary | ICD-10-CM | POA: Diagnosis not present

## 2014-03-09 DIAGNOSIS — D509 Iron deficiency anemia, unspecified: Secondary | ICD-10-CM | POA: Diagnosis not present

## 2014-03-09 DIAGNOSIS — N2581 Secondary hyperparathyroidism of renal origin: Secondary | ICD-10-CM | POA: Diagnosis not present

## 2014-03-09 DIAGNOSIS — D631 Anemia in chronic kidney disease: Secondary | ICD-10-CM | POA: Diagnosis not present

## 2014-03-10 DIAGNOSIS — N2581 Secondary hyperparathyroidism of renal origin: Secondary | ICD-10-CM | POA: Diagnosis not present

## 2014-03-10 DIAGNOSIS — N186 End stage renal disease: Secondary | ICD-10-CM | POA: Diagnosis not present

## 2014-03-10 DIAGNOSIS — D509 Iron deficiency anemia, unspecified: Secondary | ICD-10-CM | POA: Diagnosis not present

## 2014-03-10 DIAGNOSIS — N039 Chronic nephritic syndrome with unspecified morphologic changes: Secondary | ICD-10-CM | POA: Diagnosis not present

## 2014-03-10 DIAGNOSIS — D631 Anemia in chronic kidney disease: Secondary | ICD-10-CM | POA: Diagnosis not present

## 2014-03-11 DIAGNOSIS — N2581 Secondary hyperparathyroidism of renal origin: Secondary | ICD-10-CM | POA: Diagnosis not present

## 2014-03-11 DIAGNOSIS — N186 End stage renal disease: Secondary | ICD-10-CM | POA: Diagnosis not present

## 2014-03-11 DIAGNOSIS — D509 Iron deficiency anemia, unspecified: Secondary | ICD-10-CM | POA: Diagnosis not present

## 2014-03-11 DIAGNOSIS — D631 Anemia in chronic kidney disease: Secondary | ICD-10-CM | POA: Diagnosis not present

## 2014-03-12 DIAGNOSIS — N186 End stage renal disease: Secondary | ICD-10-CM | POA: Diagnosis not present

## 2014-03-12 DIAGNOSIS — D631 Anemia in chronic kidney disease: Secondary | ICD-10-CM | POA: Diagnosis not present

## 2014-03-12 DIAGNOSIS — N2581 Secondary hyperparathyroidism of renal origin: Secondary | ICD-10-CM | POA: Diagnosis not present

## 2014-03-12 DIAGNOSIS — N039 Chronic nephritic syndrome with unspecified morphologic changes: Secondary | ICD-10-CM | POA: Diagnosis not present

## 2014-03-12 DIAGNOSIS — D509 Iron deficiency anemia, unspecified: Secondary | ICD-10-CM | POA: Diagnosis not present

## 2014-03-13 DIAGNOSIS — D509 Iron deficiency anemia, unspecified: Secondary | ICD-10-CM | POA: Diagnosis not present

## 2014-03-13 DIAGNOSIS — N039 Chronic nephritic syndrome with unspecified morphologic changes: Secondary | ICD-10-CM | POA: Diagnosis not present

## 2014-03-13 DIAGNOSIS — N2581 Secondary hyperparathyroidism of renal origin: Secondary | ICD-10-CM | POA: Diagnosis not present

## 2014-03-13 DIAGNOSIS — N186 End stage renal disease: Secondary | ICD-10-CM | POA: Diagnosis not present

## 2014-03-13 DIAGNOSIS — D631 Anemia in chronic kidney disease: Secondary | ICD-10-CM | POA: Diagnosis not present

## 2014-03-14 DIAGNOSIS — D509 Iron deficiency anemia, unspecified: Secondary | ICD-10-CM | POA: Diagnosis not present

## 2014-03-14 DIAGNOSIS — N2581 Secondary hyperparathyroidism of renal origin: Secondary | ICD-10-CM | POA: Diagnosis not present

## 2014-03-14 DIAGNOSIS — N186 End stage renal disease: Secondary | ICD-10-CM | POA: Diagnosis not present

## 2014-03-14 DIAGNOSIS — D631 Anemia in chronic kidney disease: Secondary | ICD-10-CM | POA: Diagnosis not present

## 2014-03-14 DIAGNOSIS — N039 Chronic nephritic syndrome with unspecified morphologic changes: Secondary | ICD-10-CM | POA: Diagnosis not present

## 2014-03-15 DIAGNOSIS — N186 End stage renal disease: Secondary | ICD-10-CM | POA: Diagnosis not present

## 2014-03-15 DIAGNOSIS — D509 Iron deficiency anemia, unspecified: Secondary | ICD-10-CM | POA: Diagnosis not present

## 2014-03-15 DIAGNOSIS — N2581 Secondary hyperparathyroidism of renal origin: Secondary | ICD-10-CM | POA: Diagnosis not present

## 2014-03-15 DIAGNOSIS — D631 Anemia in chronic kidney disease: Secondary | ICD-10-CM | POA: Diagnosis not present

## 2014-03-16 DIAGNOSIS — D631 Anemia in chronic kidney disease: Secondary | ICD-10-CM | POA: Diagnosis not present

## 2014-03-16 DIAGNOSIS — N2581 Secondary hyperparathyroidism of renal origin: Secondary | ICD-10-CM | POA: Diagnosis not present

## 2014-03-16 DIAGNOSIS — N186 End stage renal disease: Secondary | ICD-10-CM | POA: Diagnosis not present

## 2014-03-16 DIAGNOSIS — D509 Iron deficiency anemia, unspecified: Secondary | ICD-10-CM | POA: Diagnosis not present

## 2014-03-17 DIAGNOSIS — N039 Chronic nephritic syndrome with unspecified morphologic changes: Secondary | ICD-10-CM | POA: Diagnosis not present

## 2014-03-17 DIAGNOSIS — N186 End stage renal disease: Secondary | ICD-10-CM | POA: Diagnosis not present

## 2014-03-17 DIAGNOSIS — N2581 Secondary hyperparathyroidism of renal origin: Secondary | ICD-10-CM | POA: Diagnosis not present

## 2014-03-17 DIAGNOSIS — D631 Anemia in chronic kidney disease: Secondary | ICD-10-CM | POA: Diagnosis not present

## 2014-03-17 DIAGNOSIS — D509 Iron deficiency anemia, unspecified: Secondary | ICD-10-CM | POA: Diagnosis not present

## 2014-03-18 DIAGNOSIS — D631 Anemia in chronic kidney disease: Secondary | ICD-10-CM | POA: Diagnosis not present

## 2014-03-18 DIAGNOSIS — N186 End stage renal disease: Secondary | ICD-10-CM | POA: Diagnosis not present

## 2014-03-18 DIAGNOSIS — N2581 Secondary hyperparathyroidism of renal origin: Secondary | ICD-10-CM | POA: Diagnosis not present

## 2014-03-18 DIAGNOSIS — D509 Iron deficiency anemia, unspecified: Secondary | ICD-10-CM | POA: Diagnosis not present

## 2014-03-19 DIAGNOSIS — N2581 Secondary hyperparathyroidism of renal origin: Secondary | ICD-10-CM | POA: Diagnosis not present

## 2014-03-19 DIAGNOSIS — D631 Anemia in chronic kidney disease: Secondary | ICD-10-CM | POA: Diagnosis not present

## 2014-03-19 DIAGNOSIS — D509 Iron deficiency anemia, unspecified: Secondary | ICD-10-CM | POA: Diagnosis not present

## 2014-03-19 DIAGNOSIS — N186 End stage renal disease: Secondary | ICD-10-CM | POA: Diagnosis not present

## 2014-03-20 DIAGNOSIS — D509 Iron deficiency anemia, unspecified: Secondary | ICD-10-CM | POA: Diagnosis not present

## 2014-03-20 DIAGNOSIS — N186 End stage renal disease: Secondary | ICD-10-CM | POA: Diagnosis not present

## 2014-03-20 DIAGNOSIS — D631 Anemia in chronic kidney disease: Secondary | ICD-10-CM | POA: Diagnosis not present

## 2014-03-20 DIAGNOSIS — N2581 Secondary hyperparathyroidism of renal origin: Secondary | ICD-10-CM | POA: Diagnosis not present

## 2014-03-21 DIAGNOSIS — N186 End stage renal disease: Secondary | ICD-10-CM | POA: Diagnosis not present

## 2014-03-21 DIAGNOSIS — N2581 Secondary hyperparathyroidism of renal origin: Secondary | ICD-10-CM | POA: Diagnosis not present

## 2014-03-21 DIAGNOSIS — D631 Anemia in chronic kidney disease: Secondary | ICD-10-CM | POA: Diagnosis not present

## 2014-03-21 DIAGNOSIS — D509 Iron deficiency anemia, unspecified: Secondary | ICD-10-CM | POA: Diagnosis not present

## 2014-03-22 DIAGNOSIS — N2581 Secondary hyperparathyroidism of renal origin: Secondary | ICD-10-CM | POA: Diagnosis not present

## 2014-03-22 DIAGNOSIS — D631 Anemia in chronic kidney disease: Secondary | ICD-10-CM | POA: Diagnosis not present

## 2014-03-22 DIAGNOSIS — N186 End stage renal disease: Secondary | ICD-10-CM | POA: Diagnosis not present

## 2014-03-22 DIAGNOSIS — D509 Iron deficiency anemia, unspecified: Secondary | ICD-10-CM | POA: Diagnosis not present

## 2014-03-23 DIAGNOSIS — D509 Iron deficiency anemia, unspecified: Secondary | ICD-10-CM | POA: Diagnosis not present

## 2014-03-23 DIAGNOSIS — N2581 Secondary hyperparathyroidism of renal origin: Secondary | ICD-10-CM | POA: Diagnosis not present

## 2014-03-23 DIAGNOSIS — D631 Anemia in chronic kidney disease: Secondary | ICD-10-CM | POA: Diagnosis not present

## 2014-03-23 DIAGNOSIS — N186 End stage renal disease: Secondary | ICD-10-CM | POA: Diagnosis not present

## 2014-03-24 DIAGNOSIS — N2581 Secondary hyperparathyroidism of renal origin: Secondary | ICD-10-CM | POA: Diagnosis not present

## 2014-03-24 DIAGNOSIS — D631 Anemia in chronic kidney disease: Secondary | ICD-10-CM | POA: Diagnosis not present

## 2014-03-24 DIAGNOSIS — N186 End stage renal disease: Secondary | ICD-10-CM | POA: Diagnosis not present

## 2014-03-24 DIAGNOSIS — D509 Iron deficiency anemia, unspecified: Secondary | ICD-10-CM | POA: Diagnosis not present

## 2014-03-25 DIAGNOSIS — D631 Anemia in chronic kidney disease: Secondary | ICD-10-CM | POA: Diagnosis not present

## 2014-03-25 DIAGNOSIS — N186 End stage renal disease: Secondary | ICD-10-CM | POA: Diagnosis not present

## 2014-03-25 DIAGNOSIS — D509 Iron deficiency anemia, unspecified: Secondary | ICD-10-CM | POA: Diagnosis not present

## 2014-03-25 DIAGNOSIS — N2581 Secondary hyperparathyroidism of renal origin: Secondary | ICD-10-CM | POA: Diagnosis not present

## 2014-03-26 DIAGNOSIS — N2581 Secondary hyperparathyroidism of renal origin: Secondary | ICD-10-CM | POA: Diagnosis not present

## 2014-03-26 DIAGNOSIS — D509 Iron deficiency anemia, unspecified: Secondary | ICD-10-CM | POA: Diagnosis not present

## 2014-03-26 DIAGNOSIS — N186 End stage renal disease: Secondary | ICD-10-CM | POA: Diagnosis not present

## 2014-03-26 DIAGNOSIS — D631 Anemia in chronic kidney disease: Secondary | ICD-10-CM | POA: Diagnosis not present

## 2014-03-27 DIAGNOSIS — D509 Iron deficiency anemia, unspecified: Secondary | ICD-10-CM | POA: Diagnosis not present

## 2014-03-27 DIAGNOSIS — N2581 Secondary hyperparathyroidism of renal origin: Secondary | ICD-10-CM | POA: Diagnosis not present

## 2014-03-27 DIAGNOSIS — N186 End stage renal disease: Secondary | ICD-10-CM | POA: Diagnosis not present

## 2014-03-27 DIAGNOSIS — D631 Anemia in chronic kidney disease: Secondary | ICD-10-CM | POA: Diagnosis not present

## 2014-03-28 DIAGNOSIS — N2581 Secondary hyperparathyroidism of renal origin: Secondary | ICD-10-CM | POA: Diagnosis not present

## 2014-03-28 DIAGNOSIS — N039 Chronic nephritic syndrome with unspecified morphologic changes: Secondary | ICD-10-CM | POA: Diagnosis not present

## 2014-03-28 DIAGNOSIS — N186 End stage renal disease: Secondary | ICD-10-CM | POA: Diagnosis not present

## 2014-03-28 DIAGNOSIS — D509 Iron deficiency anemia, unspecified: Secondary | ICD-10-CM | POA: Diagnosis not present

## 2014-03-28 DIAGNOSIS — D631 Anemia in chronic kidney disease: Secondary | ICD-10-CM | POA: Diagnosis not present

## 2014-03-29 DIAGNOSIS — N186 End stage renal disease: Secondary | ICD-10-CM | POA: Diagnosis not present

## 2014-03-29 DIAGNOSIS — D509 Iron deficiency anemia, unspecified: Secondary | ICD-10-CM | POA: Diagnosis not present

## 2014-03-29 DIAGNOSIS — N2581 Secondary hyperparathyroidism of renal origin: Secondary | ICD-10-CM | POA: Diagnosis not present

## 2014-03-29 DIAGNOSIS — D631 Anemia in chronic kidney disease: Secondary | ICD-10-CM | POA: Diagnosis not present

## 2014-03-30 DIAGNOSIS — D509 Iron deficiency anemia, unspecified: Secondary | ICD-10-CM | POA: Diagnosis not present

## 2014-03-30 DIAGNOSIS — N2581 Secondary hyperparathyroidism of renal origin: Secondary | ICD-10-CM | POA: Diagnosis not present

## 2014-03-30 DIAGNOSIS — D631 Anemia in chronic kidney disease: Secondary | ICD-10-CM | POA: Diagnosis not present

## 2014-03-30 DIAGNOSIS — N186 End stage renal disease: Secondary | ICD-10-CM | POA: Diagnosis not present

## 2014-03-31 DIAGNOSIS — I10 Essential (primary) hypertension: Secondary | ICD-10-CM | POA: Diagnosis not present

## 2014-03-31 DIAGNOSIS — D509 Iron deficiency anemia, unspecified: Secondary | ICD-10-CM | POA: Diagnosis not present

## 2014-03-31 DIAGNOSIS — N186 End stage renal disease: Secondary | ICD-10-CM | POA: Diagnosis not present

## 2014-03-31 DIAGNOSIS — D631 Anemia in chronic kidney disease: Secondary | ICD-10-CM | POA: Diagnosis not present

## 2014-03-31 DIAGNOSIS — N2581 Secondary hyperparathyroidism of renal origin: Secondary | ICD-10-CM | POA: Diagnosis not present

## 2014-04-01 DIAGNOSIS — D631 Anemia in chronic kidney disease: Secondary | ICD-10-CM | POA: Diagnosis not present

## 2014-04-01 DIAGNOSIS — N186 End stage renal disease: Secondary | ICD-10-CM | POA: Diagnosis not present

## 2014-04-01 DIAGNOSIS — N2581 Secondary hyperparathyroidism of renal origin: Secondary | ICD-10-CM | POA: Diagnosis not present

## 2014-04-01 DIAGNOSIS — D509 Iron deficiency anemia, unspecified: Secondary | ICD-10-CM | POA: Diagnosis not present

## 2014-04-02 DIAGNOSIS — D631 Anemia in chronic kidney disease: Secondary | ICD-10-CM | POA: Diagnosis not present

## 2014-04-02 DIAGNOSIS — N186 End stage renal disease: Secondary | ICD-10-CM | POA: Diagnosis not present

## 2014-04-02 DIAGNOSIS — D509 Iron deficiency anemia, unspecified: Secondary | ICD-10-CM | POA: Diagnosis not present

## 2014-04-02 DIAGNOSIS — N2581 Secondary hyperparathyroidism of renal origin: Secondary | ICD-10-CM | POA: Diagnosis not present

## 2014-04-03 DIAGNOSIS — N2581 Secondary hyperparathyroidism of renal origin: Secondary | ICD-10-CM | POA: Diagnosis not present

## 2014-04-03 DIAGNOSIS — N186 End stage renal disease: Secondary | ICD-10-CM | POA: Diagnosis not present

## 2014-04-03 DIAGNOSIS — D631 Anemia in chronic kidney disease: Secondary | ICD-10-CM | POA: Diagnosis not present

## 2014-04-03 DIAGNOSIS — D509 Iron deficiency anemia, unspecified: Secondary | ICD-10-CM | POA: Diagnosis not present

## 2014-04-04 DIAGNOSIS — N186 End stage renal disease: Secondary | ICD-10-CM | POA: Diagnosis not present

## 2014-04-04 DIAGNOSIS — N039 Chronic nephritic syndrome with unspecified morphologic changes: Secondary | ICD-10-CM | POA: Diagnosis not present

## 2014-04-04 DIAGNOSIS — N2581 Secondary hyperparathyroidism of renal origin: Secondary | ICD-10-CM | POA: Diagnosis not present

## 2014-04-04 DIAGNOSIS — D631 Anemia in chronic kidney disease: Secondary | ICD-10-CM | POA: Diagnosis not present

## 2014-04-04 DIAGNOSIS — D509 Iron deficiency anemia, unspecified: Secondary | ICD-10-CM | POA: Diagnosis not present

## 2014-04-05 DIAGNOSIS — D631 Anemia in chronic kidney disease: Secondary | ICD-10-CM | POA: Diagnosis not present

## 2014-04-05 DIAGNOSIS — N186 End stage renal disease: Secondary | ICD-10-CM | POA: Diagnosis not present

## 2014-04-05 DIAGNOSIS — N2581 Secondary hyperparathyroidism of renal origin: Secondary | ICD-10-CM | POA: Diagnosis not present

## 2014-04-05 DIAGNOSIS — D509 Iron deficiency anemia, unspecified: Secondary | ICD-10-CM | POA: Diagnosis not present

## 2014-04-06 DIAGNOSIS — N186 End stage renal disease: Secondary | ICD-10-CM | POA: Diagnosis not present

## 2014-04-06 DIAGNOSIS — D509 Iron deficiency anemia, unspecified: Secondary | ICD-10-CM | POA: Diagnosis not present

## 2014-04-06 DIAGNOSIS — N2581 Secondary hyperparathyroidism of renal origin: Secondary | ICD-10-CM | POA: Diagnosis not present

## 2014-04-06 DIAGNOSIS — D631 Anemia in chronic kidney disease: Secondary | ICD-10-CM | POA: Diagnosis not present

## 2014-04-07 DIAGNOSIS — D509 Iron deficiency anemia, unspecified: Secondary | ICD-10-CM | POA: Diagnosis not present

## 2014-04-07 DIAGNOSIS — N2581 Secondary hyperparathyroidism of renal origin: Secondary | ICD-10-CM | POA: Diagnosis not present

## 2014-04-07 DIAGNOSIS — N186 End stage renal disease: Secondary | ICD-10-CM | POA: Diagnosis not present

## 2014-04-07 DIAGNOSIS — D631 Anemia in chronic kidney disease: Secondary | ICD-10-CM | POA: Diagnosis not present

## 2014-04-08 DIAGNOSIS — N2581 Secondary hyperparathyroidism of renal origin: Secondary | ICD-10-CM | POA: Diagnosis not present

## 2014-04-08 DIAGNOSIS — N039 Chronic nephritic syndrome with unspecified morphologic changes: Secondary | ICD-10-CM | POA: Diagnosis not present

## 2014-04-08 DIAGNOSIS — D509 Iron deficiency anemia, unspecified: Secondary | ICD-10-CM | POA: Diagnosis not present

## 2014-04-08 DIAGNOSIS — N186 End stage renal disease: Secondary | ICD-10-CM | POA: Diagnosis not present

## 2014-04-08 DIAGNOSIS — D631 Anemia in chronic kidney disease: Secondary | ICD-10-CM | POA: Diagnosis not present

## 2014-04-09 DIAGNOSIS — N2581 Secondary hyperparathyroidism of renal origin: Secondary | ICD-10-CM | POA: Diagnosis not present

## 2014-04-09 DIAGNOSIS — D509 Iron deficiency anemia, unspecified: Secondary | ICD-10-CM | POA: Diagnosis not present

## 2014-04-09 DIAGNOSIS — N186 End stage renal disease: Secondary | ICD-10-CM | POA: Diagnosis not present

## 2014-04-09 DIAGNOSIS — D631 Anemia in chronic kidney disease: Secondary | ICD-10-CM | POA: Diagnosis not present

## 2014-04-10 DIAGNOSIS — N2581 Secondary hyperparathyroidism of renal origin: Secondary | ICD-10-CM | POA: Diagnosis not present

## 2014-04-10 DIAGNOSIS — N186 End stage renal disease: Secondary | ICD-10-CM | POA: Diagnosis not present

## 2014-04-10 DIAGNOSIS — D631 Anemia in chronic kidney disease: Secondary | ICD-10-CM | POA: Diagnosis not present

## 2014-04-10 DIAGNOSIS — D509 Iron deficiency anemia, unspecified: Secondary | ICD-10-CM | POA: Diagnosis not present

## 2014-04-11 DIAGNOSIS — D509 Iron deficiency anemia, unspecified: Secondary | ICD-10-CM | POA: Diagnosis not present

## 2014-04-11 DIAGNOSIS — N2581 Secondary hyperparathyroidism of renal origin: Secondary | ICD-10-CM | POA: Diagnosis not present

## 2014-04-11 DIAGNOSIS — D631 Anemia in chronic kidney disease: Secondary | ICD-10-CM | POA: Diagnosis not present

## 2014-04-11 DIAGNOSIS — N186 End stage renal disease: Secondary | ICD-10-CM | POA: Diagnosis not present

## 2014-04-12 DIAGNOSIS — D509 Iron deficiency anemia, unspecified: Secondary | ICD-10-CM | POA: Diagnosis not present

## 2014-04-12 DIAGNOSIS — N039 Chronic nephritic syndrome with unspecified morphologic changes: Secondary | ICD-10-CM | POA: Diagnosis not present

## 2014-04-12 DIAGNOSIS — N186 End stage renal disease: Secondary | ICD-10-CM | POA: Diagnosis not present

## 2014-04-12 DIAGNOSIS — N2581 Secondary hyperparathyroidism of renal origin: Secondary | ICD-10-CM | POA: Diagnosis not present

## 2014-04-12 DIAGNOSIS — D631 Anemia in chronic kidney disease: Secondary | ICD-10-CM | POA: Diagnosis not present

## 2014-04-13 DIAGNOSIS — N186 End stage renal disease: Secondary | ICD-10-CM | POA: Diagnosis not present

## 2014-04-13 DIAGNOSIS — D509 Iron deficiency anemia, unspecified: Secondary | ICD-10-CM | POA: Diagnosis not present

## 2014-04-13 DIAGNOSIS — N2581 Secondary hyperparathyroidism of renal origin: Secondary | ICD-10-CM | POA: Diagnosis not present

## 2014-04-13 DIAGNOSIS — D631 Anemia in chronic kidney disease: Secondary | ICD-10-CM | POA: Diagnosis not present

## 2014-04-14 DIAGNOSIS — D631 Anemia in chronic kidney disease: Secondary | ICD-10-CM | POA: Diagnosis not present

## 2014-04-14 DIAGNOSIS — D509 Iron deficiency anemia, unspecified: Secondary | ICD-10-CM | POA: Diagnosis not present

## 2014-04-14 DIAGNOSIS — N039 Chronic nephritic syndrome with unspecified morphologic changes: Secondary | ICD-10-CM | POA: Diagnosis not present

## 2014-04-14 DIAGNOSIS — N2581 Secondary hyperparathyroidism of renal origin: Secondary | ICD-10-CM | POA: Diagnosis not present

## 2014-04-14 DIAGNOSIS — N186 End stage renal disease: Secondary | ICD-10-CM | POA: Diagnosis not present

## 2014-04-15 DIAGNOSIS — N186 End stage renal disease: Secondary | ICD-10-CM | POA: Diagnosis not present

## 2014-04-15 DIAGNOSIS — N039 Chronic nephritic syndrome with unspecified morphologic changes: Secondary | ICD-10-CM | POA: Diagnosis not present

## 2014-04-15 DIAGNOSIS — N2581 Secondary hyperparathyroidism of renal origin: Secondary | ICD-10-CM | POA: Diagnosis not present

## 2014-04-15 DIAGNOSIS — D631 Anemia in chronic kidney disease: Secondary | ICD-10-CM | POA: Diagnosis not present

## 2014-04-15 DIAGNOSIS — D509 Iron deficiency anemia, unspecified: Secondary | ICD-10-CM | POA: Diagnosis not present

## 2014-04-16 DIAGNOSIS — N2581 Secondary hyperparathyroidism of renal origin: Secondary | ICD-10-CM | POA: Diagnosis not present

## 2014-04-16 DIAGNOSIS — D509 Iron deficiency anemia, unspecified: Secondary | ICD-10-CM | POA: Diagnosis not present

## 2014-04-16 DIAGNOSIS — N039 Chronic nephritic syndrome with unspecified morphologic changes: Secondary | ICD-10-CM | POA: Diagnosis not present

## 2014-04-16 DIAGNOSIS — N186 End stage renal disease: Secondary | ICD-10-CM | POA: Diagnosis not present

## 2014-04-16 DIAGNOSIS — D631 Anemia in chronic kidney disease: Secondary | ICD-10-CM | POA: Diagnosis not present

## 2014-04-17 DIAGNOSIS — D509 Iron deficiency anemia, unspecified: Secondary | ICD-10-CM | POA: Diagnosis not present

## 2014-04-17 DIAGNOSIS — N2581 Secondary hyperparathyroidism of renal origin: Secondary | ICD-10-CM | POA: Diagnosis not present

## 2014-04-17 DIAGNOSIS — N039 Chronic nephritic syndrome with unspecified morphologic changes: Secondary | ICD-10-CM | POA: Diagnosis not present

## 2014-04-17 DIAGNOSIS — N186 End stage renal disease: Secondary | ICD-10-CM | POA: Diagnosis not present

## 2014-04-17 DIAGNOSIS — D631 Anemia in chronic kidney disease: Secondary | ICD-10-CM | POA: Diagnosis not present

## 2014-04-18 DIAGNOSIS — N186 End stage renal disease: Secondary | ICD-10-CM | POA: Diagnosis not present

## 2014-04-18 DIAGNOSIS — D509 Iron deficiency anemia, unspecified: Secondary | ICD-10-CM | POA: Diagnosis not present

## 2014-04-18 DIAGNOSIS — N2581 Secondary hyperparathyroidism of renal origin: Secondary | ICD-10-CM | POA: Diagnosis not present

## 2014-04-18 DIAGNOSIS — D631 Anemia in chronic kidney disease: Secondary | ICD-10-CM | POA: Diagnosis not present

## 2014-04-19 DIAGNOSIS — D631 Anemia in chronic kidney disease: Secondary | ICD-10-CM | POA: Diagnosis not present

## 2014-04-19 DIAGNOSIS — N039 Chronic nephritic syndrome with unspecified morphologic changes: Secondary | ICD-10-CM | POA: Diagnosis not present

## 2014-04-19 DIAGNOSIS — D509 Iron deficiency anemia, unspecified: Secondary | ICD-10-CM | POA: Diagnosis not present

## 2014-04-19 DIAGNOSIS — N186 End stage renal disease: Secondary | ICD-10-CM | POA: Diagnosis not present

## 2014-04-20 DIAGNOSIS — Z5181 Encounter for therapeutic drug level monitoring: Secondary | ICD-10-CM | POA: Diagnosis not present

## 2014-04-20 DIAGNOSIS — Z7901 Long term (current) use of anticoagulants: Secondary | ICD-10-CM | POA: Diagnosis not present

## 2014-05-03 DIAGNOSIS — N186 End stage renal disease: Secondary | ICD-10-CM | POA: Diagnosis not present

## 2014-05-20 DIAGNOSIS — N2581 Secondary hyperparathyroidism of renal origin: Secondary | ICD-10-CM | POA: Diagnosis not present

## 2014-05-20 DIAGNOSIS — D631 Anemia in chronic kidney disease: Secondary | ICD-10-CM | POA: Diagnosis not present

## 2014-05-20 DIAGNOSIS — N186 End stage renal disease: Secondary | ICD-10-CM | POA: Diagnosis not present

## 2014-05-20 DIAGNOSIS — D509 Iron deficiency anemia, unspecified: Secondary | ICD-10-CM | POA: Diagnosis not present

## 2014-05-20 DIAGNOSIS — N039 Chronic nephritic syndrome with unspecified morphologic changes: Secondary | ICD-10-CM | POA: Diagnosis not present

## 2014-05-21 DIAGNOSIS — N186 End stage renal disease: Secondary | ICD-10-CM | POA: Diagnosis not present

## 2014-05-21 DIAGNOSIS — D631 Anemia in chronic kidney disease: Secondary | ICD-10-CM | POA: Diagnosis not present

## 2014-05-21 DIAGNOSIS — N2581 Secondary hyperparathyroidism of renal origin: Secondary | ICD-10-CM | POA: Diagnosis not present

## 2014-05-21 DIAGNOSIS — D509 Iron deficiency anemia, unspecified: Secondary | ICD-10-CM | POA: Diagnosis not present

## 2014-05-22 DIAGNOSIS — N039 Chronic nephritic syndrome with unspecified morphologic changes: Secondary | ICD-10-CM | POA: Diagnosis not present

## 2014-05-22 DIAGNOSIS — N2581 Secondary hyperparathyroidism of renal origin: Secondary | ICD-10-CM | POA: Diagnosis not present

## 2014-05-22 DIAGNOSIS — D509 Iron deficiency anemia, unspecified: Secondary | ICD-10-CM | POA: Diagnosis not present

## 2014-05-22 DIAGNOSIS — N186 End stage renal disease: Secondary | ICD-10-CM | POA: Diagnosis not present

## 2014-05-22 DIAGNOSIS — D631 Anemia in chronic kidney disease: Secondary | ICD-10-CM | POA: Diagnosis not present

## 2014-05-23 DIAGNOSIS — D631 Anemia in chronic kidney disease: Secondary | ICD-10-CM | POA: Diagnosis not present

## 2014-05-23 DIAGNOSIS — N186 End stage renal disease: Secondary | ICD-10-CM | POA: Diagnosis not present

## 2014-05-23 DIAGNOSIS — N2581 Secondary hyperparathyroidism of renal origin: Secondary | ICD-10-CM | POA: Diagnosis not present

## 2014-05-23 DIAGNOSIS — D509 Iron deficiency anemia, unspecified: Secondary | ICD-10-CM | POA: Diagnosis not present

## 2014-05-24 DIAGNOSIS — D509 Iron deficiency anemia, unspecified: Secondary | ICD-10-CM | POA: Diagnosis not present

## 2014-05-24 DIAGNOSIS — N2581 Secondary hyperparathyroidism of renal origin: Secondary | ICD-10-CM | POA: Diagnosis not present

## 2014-05-24 DIAGNOSIS — N186 End stage renal disease: Secondary | ICD-10-CM | POA: Diagnosis not present

## 2014-05-24 DIAGNOSIS — D631 Anemia in chronic kidney disease: Secondary | ICD-10-CM | POA: Diagnosis not present

## 2014-05-25 DIAGNOSIS — D631 Anemia in chronic kidney disease: Secondary | ICD-10-CM | POA: Diagnosis not present

## 2014-05-25 DIAGNOSIS — N2581 Secondary hyperparathyroidism of renal origin: Secondary | ICD-10-CM | POA: Diagnosis not present

## 2014-05-25 DIAGNOSIS — D509 Iron deficiency anemia, unspecified: Secondary | ICD-10-CM | POA: Diagnosis not present

## 2014-05-25 DIAGNOSIS — N186 End stage renal disease: Secondary | ICD-10-CM | POA: Diagnosis not present

## 2014-05-26 DIAGNOSIS — N039 Chronic nephritic syndrome with unspecified morphologic changes: Secondary | ICD-10-CM | POA: Diagnosis not present

## 2014-05-26 DIAGNOSIS — D509 Iron deficiency anemia, unspecified: Secondary | ICD-10-CM | POA: Diagnosis not present

## 2014-05-26 DIAGNOSIS — N2581 Secondary hyperparathyroidism of renal origin: Secondary | ICD-10-CM | POA: Diagnosis not present

## 2014-05-26 DIAGNOSIS — N186 End stage renal disease: Secondary | ICD-10-CM | POA: Diagnosis not present

## 2014-05-26 DIAGNOSIS — D631 Anemia in chronic kidney disease: Secondary | ICD-10-CM | POA: Diagnosis not present

## 2014-05-27 DIAGNOSIS — D631 Anemia in chronic kidney disease: Secondary | ICD-10-CM | POA: Diagnosis not present

## 2014-05-27 DIAGNOSIS — D509 Iron deficiency anemia, unspecified: Secondary | ICD-10-CM | POA: Diagnosis not present

## 2014-05-27 DIAGNOSIS — N186 End stage renal disease: Secondary | ICD-10-CM | POA: Diagnosis not present

## 2014-05-27 DIAGNOSIS — N2581 Secondary hyperparathyroidism of renal origin: Secondary | ICD-10-CM | POA: Diagnosis not present

## 2014-05-28 DIAGNOSIS — D631 Anemia in chronic kidney disease: Secondary | ICD-10-CM | POA: Diagnosis not present

## 2014-05-28 DIAGNOSIS — N039 Chronic nephritic syndrome with unspecified morphologic changes: Secondary | ICD-10-CM | POA: Diagnosis not present

## 2014-05-28 DIAGNOSIS — D509 Iron deficiency anemia, unspecified: Secondary | ICD-10-CM | POA: Diagnosis not present

## 2014-05-28 DIAGNOSIS — N2581 Secondary hyperparathyroidism of renal origin: Secondary | ICD-10-CM | POA: Diagnosis not present

## 2014-05-28 DIAGNOSIS — N186 End stage renal disease: Secondary | ICD-10-CM | POA: Diagnosis not present

## 2014-05-29 DIAGNOSIS — N2581 Secondary hyperparathyroidism of renal origin: Secondary | ICD-10-CM | POA: Diagnosis not present

## 2014-05-29 DIAGNOSIS — N186 End stage renal disease: Secondary | ICD-10-CM | POA: Diagnosis not present

## 2014-05-29 DIAGNOSIS — D509 Iron deficiency anemia, unspecified: Secondary | ICD-10-CM | POA: Diagnosis not present

## 2014-05-29 DIAGNOSIS — D631 Anemia in chronic kidney disease: Secondary | ICD-10-CM | POA: Diagnosis not present

## 2014-05-30 DIAGNOSIS — N186 End stage renal disease: Secondary | ICD-10-CM | POA: Diagnosis not present

## 2014-05-30 DIAGNOSIS — D631 Anemia in chronic kidney disease: Secondary | ICD-10-CM | POA: Diagnosis not present

## 2014-05-30 DIAGNOSIS — D509 Iron deficiency anemia, unspecified: Secondary | ICD-10-CM | POA: Diagnosis not present

## 2014-05-30 DIAGNOSIS — N2581 Secondary hyperparathyroidism of renal origin: Secondary | ICD-10-CM | POA: Diagnosis not present

## 2014-05-31 DIAGNOSIS — N186 End stage renal disease: Secondary | ICD-10-CM | POA: Diagnosis not present

## 2014-05-31 DIAGNOSIS — N2581 Secondary hyperparathyroidism of renal origin: Secondary | ICD-10-CM | POA: Diagnosis not present

## 2014-05-31 DIAGNOSIS — D631 Anemia in chronic kidney disease: Secondary | ICD-10-CM | POA: Diagnosis not present

## 2014-05-31 DIAGNOSIS — D509 Iron deficiency anemia, unspecified: Secondary | ICD-10-CM | POA: Diagnosis not present

## 2014-06-01 DIAGNOSIS — N186 End stage renal disease: Secondary | ICD-10-CM | POA: Diagnosis not present

## 2014-06-01 DIAGNOSIS — D631 Anemia in chronic kidney disease: Secondary | ICD-10-CM | POA: Diagnosis not present

## 2014-06-01 DIAGNOSIS — N2581 Secondary hyperparathyroidism of renal origin: Secondary | ICD-10-CM | POA: Diagnosis not present

## 2014-06-01 DIAGNOSIS — D509 Iron deficiency anemia, unspecified: Secondary | ICD-10-CM | POA: Diagnosis not present

## 2014-06-02 DIAGNOSIS — D509 Iron deficiency anemia, unspecified: Secondary | ICD-10-CM | POA: Diagnosis not present

## 2014-06-02 DIAGNOSIS — D631 Anemia in chronic kidney disease: Secondary | ICD-10-CM | POA: Diagnosis not present

## 2014-06-02 DIAGNOSIS — N186 End stage renal disease: Secondary | ICD-10-CM | POA: Diagnosis not present

## 2014-06-02 DIAGNOSIS — N2581 Secondary hyperparathyroidism of renal origin: Secondary | ICD-10-CM | POA: Diagnosis not present

## 2014-06-03 DIAGNOSIS — D631 Anemia in chronic kidney disease: Secondary | ICD-10-CM | POA: Diagnosis not present

## 2014-06-03 DIAGNOSIS — D509 Iron deficiency anemia, unspecified: Secondary | ICD-10-CM | POA: Diagnosis not present

## 2014-06-03 DIAGNOSIS — N186 End stage renal disease: Secondary | ICD-10-CM | POA: Diagnosis not present

## 2014-06-03 DIAGNOSIS — N2581 Secondary hyperparathyroidism of renal origin: Secondary | ICD-10-CM | POA: Diagnosis not present

## 2014-06-03 DIAGNOSIS — N039 Chronic nephritic syndrome with unspecified morphologic changes: Secondary | ICD-10-CM | POA: Diagnosis not present

## 2014-06-04 DIAGNOSIS — N2581 Secondary hyperparathyroidism of renal origin: Secondary | ICD-10-CM | POA: Diagnosis not present

## 2014-06-04 DIAGNOSIS — D631 Anemia in chronic kidney disease: Secondary | ICD-10-CM | POA: Diagnosis not present

## 2014-06-04 DIAGNOSIS — N186 End stage renal disease: Secondary | ICD-10-CM | POA: Diagnosis not present

## 2014-06-04 DIAGNOSIS — D509 Iron deficiency anemia, unspecified: Secondary | ICD-10-CM | POA: Diagnosis not present

## 2014-06-05 DIAGNOSIS — Z954 Presence of other heart-valve replacement: Secondary | ICD-10-CM | POA: Diagnosis not present

## 2014-06-05 DIAGNOSIS — Z1331 Encounter for screening for depression: Secondary | ICD-10-CM | POA: Diagnosis not present

## 2014-06-05 DIAGNOSIS — R5381 Other malaise: Secondary | ICD-10-CM | POA: Diagnosis not present

## 2014-06-05 DIAGNOSIS — R5383 Other fatigue: Secondary | ICD-10-CM | POA: Diagnosis not present

## 2014-06-05 DIAGNOSIS — R221 Localized swelling, mass and lump, neck: Secondary | ICD-10-CM | POA: Diagnosis not present

## 2014-06-05 DIAGNOSIS — D631 Anemia in chronic kidney disease: Secondary | ICD-10-CM | POA: Diagnosis not present

## 2014-06-05 DIAGNOSIS — E2749 Other adrenocortical insufficiency: Secondary | ICD-10-CM | POA: Diagnosis not present

## 2014-06-05 DIAGNOSIS — N039 Chronic nephritic syndrome with unspecified morphologic changes: Secondary | ICD-10-CM | POA: Diagnosis not present

## 2014-06-05 DIAGNOSIS — I509 Heart failure, unspecified: Secondary | ICD-10-CM | POA: Diagnosis not present

## 2014-06-05 DIAGNOSIS — R220 Localized swelling, mass and lump, head: Secondary | ICD-10-CM | POA: Diagnosis not present

## 2014-06-05 DIAGNOSIS — G8929 Other chronic pain: Secondary | ICD-10-CM | POA: Diagnosis not present

## 2014-06-05 DIAGNOSIS — J90 Pleural effusion, not elsewhere classified: Secondary | ICD-10-CM | POA: Diagnosis not present

## 2014-06-05 DIAGNOSIS — Z885 Allergy status to narcotic agent status: Secondary | ICD-10-CM | POA: Diagnosis not present

## 2014-06-05 DIAGNOSIS — I120 Hypertensive chronic kidney disease with stage 5 chronic kidney disease or end stage renal disease: Secondary | ICD-10-CM | POA: Diagnosis not present

## 2014-06-05 DIAGNOSIS — Z992 Dependence on renal dialysis: Secondary | ICD-10-CM | POA: Diagnosis not present

## 2014-06-05 DIAGNOSIS — Z79899 Other long term (current) drug therapy: Secondary | ICD-10-CM | POA: Diagnosis not present

## 2014-06-05 DIAGNOSIS — Z7901 Long term (current) use of anticoagulants: Secondary | ICD-10-CM | POA: Diagnosis not present

## 2014-06-05 DIAGNOSIS — K658 Other peritonitis: Secondary | ICD-10-CM | POA: Diagnosis not present

## 2014-06-05 DIAGNOSIS — F17200 Nicotine dependence, unspecified, uncomplicated: Secondary | ICD-10-CM | POA: Diagnosis not present

## 2014-06-05 DIAGNOSIS — R17 Unspecified jaundice: Secondary | ICD-10-CM | POA: Diagnosis not present

## 2014-06-05 DIAGNOSIS — R509 Fever, unspecified: Secondary | ICD-10-CM | POA: Diagnosis not present

## 2014-06-05 DIAGNOSIS — M549 Dorsalgia, unspecified: Secondary | ICD-10-CM | POA: Diagnosis not present

## 2014-06-05 DIAGNOSIS — N186 End stage renal disease: Secondary | ICD-10-CM | POA: Diagnosis not present

## 2014-06-05 DIAGNOSIS — D509 Iron deficiency anemia, unspecified: Secondary | ICD-10-CM | POA: Diagnosis not present

## 2014-06-05 DIAGNOSIS — J449 Chronic obstructive pulmonary disease, unspecified: Secondary | ICD-10-CM | POA: Diagnosis not present

## 2014-06-05 DIAGNOSIS — D649 Anemia, unspecified: Secondary | ICD-10-CM | POA: Diagnosis not present

## 2014-06-05 DIAGNOSIS — Z0000 Encounter for general adult medical examination without abnormal findings: Secondary | ICD-10-CM | POA: Diagnosis not present

## 2014-06-05 DIAGNOSIS — I10 Essential (primary) hypertension: Secondary | ICD-10-CM | POA: Diagnosis not present

## 2014-06-05 DIAGNOSIS — N2581 Secondary hyperparathyroidism of renal origin: Secondary | ICD-10-CM | POA: Diagnosis not present

## 2014-06-05 DIAGNOSIS — R42 Dizziness and giddiness: Secondary | ICD-10-CM | POA: Diagnosis not present

## 2014-06-06 DIAGNOSIS — R5381 Other malaise: Secondary | ICD-10-CM | POA: Diagnosis not present

## 2014-06-06 DIAGNOSIS — R42 Dizziness and giddiness: Secondary | ICD-10-CM | POA: Diagnosis not present

## 2014-06-06 DIAGNOSIS — R5383 Other fatigue: Secondary | ICD-10-CM | POA: Diagnosis not present

## 2014-06-06 DIAGNOSIS — D631 Anemia in chronic kidney disease: Secondary | ICD-10-CM | POA: Diagnosis not present

## 2014-06-06 DIAGNOSIS — Z954 Presence of other heart-valve replacement: Secondary | ICD-10-CM | POA: Diagnosis not present

## 2014-06-06 DIAGNOSIS — R509 Fever, unspecified: Secondary | ICD-10-CM | POA: Diagnosis not present

## 2014-06-06 DIAGNOSIS — N2581 Secondary hyperparathyroidism of renal origin: Secondary | ICD-10-CM | POA: Diagnosis not present

## 2014-06-06 DIAGNOSIS — N039 Chronic nephritic syndrome with unspecified morphologic changes: Secondary | ICD-10-CM | POA: Diagnosis not present

## 2014-06-06 DIAGNOSIS — N186 End stage renal disease: Secondary | ICD-10-CM | POA: Diagnosis not present

## 2014-06-06 DIAGNOSIS — D509 Iron deficiency anemia, unspecified: Secondary | ICD-10-CM | POA: Diagnosis not present

## 2014-06-07 DIAGNOSIS — N2581 Secondary hyperparathyroidism of renal origin: Secondary | ICD-10-CM | POA: Diagnosis not present

## 2014-06-07 DIAGNOSIS — D631 Anemia in chronic kidney disease: Secondary | ICD-10-CM | POA: Diagnosis not present

## 2014-06-07 DIAGNOSIS — D509 Iron deficiency anemia, unspecified: Secondary | ICD-10-CM | POA: Diagnosis not present

## 2014-06-07 DIAGNOSIS — N186 End stage renal disease: Secondary | ICD-10-CM | POA: Diagnosis not present

## 2014-06-08 DIAGNOSIS — N186 End stage renal disease: Secondary | ICD-10-CM | POA: Diagnosis not present

## 2014-06-08 DIAGNOSIS — D509 Iron deficiency anemia, unspecified: Secondary | ICD-10-CM | POA: Diagnosis not present

## 2014-06-08 DIAGNOSIS — N039 Chronic nephritic syndrome with unspecified morphologic changes: Secondary | ICD-10-CM | POA: Diagnosis not present

## 2014-06-08 DIAGNOSIS — D631 Anemia in chronic kidney disease: Secondary | ICD-10-CM | POA: Diagnosis not present

## 2014-06-08 DIAGNOSIS — N2581 Secondary hyperparathyroidism of renal origin: Secondary | ICD-10-CM | POA: Diagnosis not present

## 2014-06-09 DIAGNOSIS — D509 Iron deficiency anemia, unspecified: Secondary | ICD-10-CM | POA: Diagnosis not present

## 2014-06-09 DIAGNOSIS — N2581 Secondary hyperparathyroidism of renal origin: Secondary | ICD-10-CM | POA: Diagnosis not present

## 2014-06-09 DIAGNOSIS — D631 Anemia in chronic kidney disease: Secondary | ICD-10-CM | POA: Diagnosis not present

## 2014-06-09 DIAGNOSIS — N186 End stage renal disease: Secondary | ICD-10-CM | POA: Diagnosis not present

## 2014-06-10 DIAGNOSIS — D509 Iron deficiency anemia, unspecified: Secondary | ICD-10-CM | POA: Diagnosis not present

## 2014-06-10 DIAGNOSIS — D631 Anemia in chronic kidney disease: Secondary | ICD-10-CM | POA: Diagnosis not present

## 2014-06-10 DIAGNOSIS — N039 Chronic nephritic syndrome with unspecified morphologic changes: Secondary | ICD-10-CM | POA: Diagnosis not present

## 2014-06-10 DIAGNOSIS — N186 End stage renal disease: Secondary | ICD-10-CM | POA: Diagnosis not present

## 2014-06-10 DIAGNOSIS — N2581 Secondary hyperparathyroidism of renal origin: Secondary | ICD-10-CM | POA: Diagnosis not present

## 2014-06-11 DIAGNOSIS — N186 End stage renal disease: Secondary | ICD-10-CM | POA: Diagnosis not present

## 2014-06-11 DIAGNOSIS — N2581 Secondary hyperparathyroidism of renal origin: Secondary | ICD-10-CM | POA: Diagnosis not present

## 2014-06-11 DIAGNOSIS — D509 Iron deficiency anemia, unspecified: Secondary | ICD-10-CM | POA: Diagnosis not present

## 2014-06-11 DIAGNOSIS — D631 Anemia in chronic kidney disease: Secondary | ICD-10-CM | POA: Diagnosis not present

## 2014-06-12 DIAGNOSIS — N2581 Secondary hyperparathyroidism of renal origin: Secondary | ICD-10-CM | POA: Diagnosis not present

## 2014-06-12 DIAGNOSIS — N039 Chronic nephritic syndrome with unspecified morphologic changes: Secondary | ICD-10-CM | POA: Diagnosis not present

## 2014-06-12 DIAGNOSIS — D631 Anemia in chronic kidney disease: Secondary | ICD-10-CM | POA: Diagnosis not present

## 2014-06-12 DIAGNOSIS — N186 End stage renal disease: Secondary | ICD-10-CM | POA: Diagnosis not present

## 2014-06-12 DIAGNOSIS — D509 Iron deficiency anemia, unspecified: Secondary | ICD-10-CM | POA: Diagnosis not present

## 2014-06-13 DIAGNOSIS — N2581 Secondary hyperparathyroidism of renal origin: Secondary | ICD-10-CM | POA: Diagnosis not present

## 2014-06-13 DIAGNOSIS — N039 Chronic nephritic syndrome with unspecified morphologic changes: Secondary | ICD-10-CM | POA: Diagnosis not present

## 2014-06-13 DIAGNOSIS — N186 End stage renal disease: Secondary | ICD-10-CM | POA: Diagnosis not present

## 2014-06-13 DIAGNOSIS — D631 Anemia in chronic kidney disease: Secondary | ICD-10-CM | POA: Diagnosis not present

## 2014-06-13 DIAGNOSIS — D509 Iron deficiency anemia, unspecified: Secondary | ICD-10-CM | POA: Diagnosis not present

## 2014-06-14 DIAGNOSIS — Z7901 Long term (current) use of anticoagulants: Secondary | ICD-10-CM | POA: Diagnosis not present

## 2014-06-14 DIAGNOSIS — N2581 Secondary hyperparathyroidism of renal origin: Secondary | ICD-10-CM | POA: Diagnosis not present

## 2014-06-14 DIAGNOSIS — D631 Anemia in chronic kidney disease: Secondary | ICD-10-CM | POA: Diagnosis not present

## 2014-06-14 DIAGNOSIS — N186 End stage renal disease: Secondary | ICD-10-CM | POA: Diagnosis not present

## 2014-06-14 DIAGNOSIS — D509 Iron deficiency anemia, unspecified: Secondary | ICD-10-CM | POA: Diagnosis not present

## 2014-06-14 DIAGNOSIS — Z5181 Encounter for therapeutic drug level monitoring: Secondary | ICD-10-CM | POA: Diagnosis not present

## 2014-06-15 DIAGNOSIS — N186 End stage renal disease: Secondary | ICD-10-CM | POA: Diagnosis not present

## 2014-06-15 DIAGNOSIS — D631 Anemia in chronic kidney disease: Secondary | ICD-10-CM | POA: Diagnosis not present

## 2014-06-15 DIAGNOSIS — N2581 Secondary hyperparathyroidism of renal origin: Secondary | ICD-10-CM | POA: Diagnosis not present

## 2014-06-15 DIAGNOSIS — D509 Iron deficiency anemia, unspecified: Secondary | ICD-10-CM | POA: Diagnosis not present

## 2014-06-15 DIAGNOSIS — N039 Chronic nephritic syndrome with unspecified morphologic changes: Secondary | ICD-10-CM | POA: Diagnosis not present

## 2014-06-16 DIAGNOSIS — N039 Chronic nephritic syndrome with unspecified morphologic changes: Secondary | ICD-10-CM | POA: Diagnosis not present

## 2014-06-16 DIAGNOSIS — D631 Anemia in chronic kidney disease: Secondary | ICD-10-CM | POA: Diagnosis not present

## 2014-06-16 DIAGNOSIS — N186 End stage renal disease: Secondary | ICD-10-CM | POA: Diagnosis not present

## 2014-06-16 DIAGNOSIS — D509 Iron deficiency anemia, unspecified: Secondary | ICD-10-CM | POA: Diagnosis not present

## 2014-06-16 DIAGNOSIS — N2581 Secondary hyperparathyroidism of renal origin: Secondary | ICD-10-CM | POA: Diagnosis not present

## 2014-06-17 DIAGNOSIS — N2581 Secondary hyperparathyroidism of renal origin: Secondary | ICD-10-CM | POA: Diagnosis not present

## 2014-06-17 DIAGNOSIS — D509 Iron deficiency anemia, unspecified: Secondary | ICD-10-CM | POA: Diagnosis not present

## 2014-06-17 DIAGNOSIS — D631 Anemia in chronic kidney disease: Secondary | ICD-10-CM | POA: Diagnosis not present

## 2014-06-17 DIAGNOSIS — N186 End stage renal disease: Secondary | ICD-10-CM | POA: Diagnosis not present

## 2014-06-17 DIAGNOSIS — N039 Chronic nephritic syndrome with unspecified morphologic changes: Secondary | ICD-10-CM | POA: Diagnosis not present

## 2014-06-18 DIAGNOSIS — D509 Iron deficiency anemia, unspecified: Secondary | ICD-10-CM | POA: Diagnosis not present

## 2014-06-18 DIAGNOSIS — N186 End stage renal disease: Secondary | ICD-10-CM | POA: Diagnosis not present

## 2014-06-18 DIAGNOSIS — N2581 Secondary hyperparathyroidism of renal origin: Secondary | ICD-10-CM | POA: Diagnosis not present

## 2014-06-18 DIAGNOSIS — D631 Anemia in chronic kidney disease: Secondary | ICD-10-CM | POA: Diagnosis not present

## 2014-06-19 DIAGNOSIS — N186 End stage renal disease: Secondary | ICD-10-CM | POA: Diagnosis not present

## 2014-06-19 DIAGNOSIS — N2581 Secondary hyperparathyroidism of renal origin: Secondary | ICD-10-CM | POA: Diagnosis not present

## 2014-06-19 DIAGNOSIS — D509 Iron deficiency anemia, unspecified: Secondary | ICD-10-CM | POA: Diagnosis not present

## 2014-06-19 DIAGNOSIS — D631 Anemia in chronic kidney disease: Secondary | ICD-10-CM | POA: Diagnosis not present

## 2014-06-20 DIAGNOSIS — D631 Anemia in chronic kidney disease: Secondary | ICD-10-CM | POA: Diagnosis not present

## 2014-06-20 DIAGNOSIS — N186 End stage renal disease: Secondary | ICD-10-CM | POA: Diagnosis not present

## 2014-06-20 DIAGNOSIS — N039 Chronic nephritic syndrome with unspecified morphologic changes: Secondary | ICD-10-CM | POA: Diagnosis not present

## 2014-06-20 DIAGNOSIS — D509 Iron deficiency anemia, unspecified: Secondary | ICD-10-CM | POA: Diagnosis not present

## 2014-06-21 DIAGNOSIS — N186 End stage renal disease: Secondary | ICD-10-CM | POA: Diagnosis not present

## 2014-06-21 DIAGNOSIS — Z992 Dependence on renal dialysis: Secondary | ICD-10-CM | POA: Diagnosis not present

## 2014-06-21 DIAGNOSIS — D631 Anemia in chronic kidney disease: Secondary | ICD-10-CM | POA: Diagnosis not present

## 2014-06-21 DIAGNOSIS — D509 Iron deficiency anemia, unspecified: Secondary | ICD-10-CM | POA: Diagnosis not present

## 2014-06-22 DIAGNOSIS — N186 End stage renal disease: Secondary | ICD-10-CM | POA: Diagnosis not present

## 2014-06-22 DIAGNOSIS — D509 Iron deficiency anemia, unspecified: Secondary | ICD-10-CM | POA: Diagnosis not present

## 2014-06-22 DIAGNOSIS — D631 Anemia in chronic kidney disease: Secondary | ICD-10-CM | POA: Diagnosis not present

## 2014-06-23 DIAGNOSIS — D631 Anemia in chronic kidney disease: Secondary | ICD-10-CM | POA: Diagnosis not present

## 2014-06-23 DIAGNOSIS — N186 End stage renal disease: Secondary | ICD-10-CM | POA: Diagnosis not present

## 2014-06-23 DIAGNOSIS — N039 Chronic nephritic syndrome with unspecified morphologic changes: Secondary | ICD-10-CM | POA: Diagnosis not present

## 2014-06-23 DIAGNOSIS — D509 Iron deficiency anemia, unspecified: Secondary | ICD-10-CM | POA: Diagnosis not present

## 2014-06-24 DIAGNOSIS — D509 Iron deficiency anemia, unspecified: Secondary | ICD-10-CM | POA: Diagnosis not present

## 2014-06-24 DIAGNOSIS — N039 Chronic nephritic syndrome with unspecified morphologic changes: Secondary | ICD-10-CM | POA: Diagnosis not present

## 2014-06-24 DIAGNOSIS — N186 End stage renal disease: Secondary | ICD-10-CM | POA: Diagnosis not present

## 2014-06-24 DIAGNOSIS — D631 Anemia in chronic kidney disease: Secondary | ICD-10-CM | POA: Diagnosis not present

## 2014-06-25 DIAGNOSIS — D509 Iron deficiency anemia, unspecified: Secondary | ICD-10-CM | POA: Diagnosis not present

## 2014-06-25 DIAGNOSIS — N186 End stage renal disease: Secondary | ICD-10-CM | POA: Diagnosis not present

## 2014-06-25 DIAGNOSIS — D631 Anemia in chronic kidney disease: Secondary | ICD-10-CM | POA: Diagnosis not present

## 2014-06-26 DIAGNOSIS — N186 End stage renal disease: Secondary | ICD-10-CM | POA: Diagnosis not present

## 2014-06-26 DIAGNOSIS — D631 Anemia in chronic kidney disease: Secondary | ICD-10-CM | POA: Diagnosis not present

## 2014-06-26 DIAGNOSIS — D509 Iron deficiency anemia, unspecified: Secondary | ICD-10-CM | POA: Diagnosis not present

## 2014-06-27 DIAGNOSIS — D631 Anemia in chronic kidney disease: Secondary | ICD-10-CM | POA: Diagnosis not present

## 2014-06-27 DIAGNOSIS — D509 Iron deficiency anemia, unspecified: Secondary | ICD-10-CM | POA: Diagnosis not present

## 2014-06-27 DIAGNOSIS — N039 Chronic nephritic syndrome with unspecified morphologic changes: Secondary | ICD-10-CM | POA: Diagnosis not present

## 2014-06-27 DIAGNOSIS — N186 End stage renal disease: Secondary | ICD-10-CM | POA: Diagnosis not present

## 2014-06-28 DIAGNOSIS — D631 Anemia in chronic kidney disease: Secondary | ICD-10-CM | POA: Diagnosis not present

## 2014-06-28 DIAGNOSIS — N186 End stage renal disease: Secondary | ICD-10-CM | POA: Diagnosis not present

## 2014-06-28 DIAGNOSIS — D509 Iron deficiency anemia, unspecified: Secondary | ICD-10-CM | POA: Diagnosis not present

## 2014-06-29 DIAGNOSIS — D509 Iron deficiency anemia, unspecified: Secondary | ICD-10-CM | POA: Diagnosis not present

## 2014-06-29 DIAGNOSIS — D631 Anemia in chronic kidney disease: Secondary | ICD-10-CM | POA: Diagnosis not present

## 2014-06-29 DIAGNOSIS — N186 End stage renal disease: Secondary | ICD-10-CM | POA: Diagnosis not present

## 2014-06-29 DIAGNOSIS — N039 Chronic nephritic syndrome with unspecified morphologic changes: Secondary | ICD-10-CM | POA: Diagnosis not present

## 2014-06-30 DIAGNOSIS — D509 Iron deficiency anemia, unspecified: Secondary | ICD-10-CM | POA: Diagnosis not present

## 2014-06-30 DIAGNOSIS — D631 Anemia in chronic kidney disease: Secondary | ICD-10-CM | POA: Diagnosis not present

## 2014-06-30 DIAGNOSIS — N186 End stage renal disease: Secondary | ICD-10-CM | POA: Diagnosis not present

## 2014-07-01 DIAGNOSIS — D631 Anemia in chronic kidney disease: Secondary | ICD-10-CM | POA: Diagnosis not present

## 2014-07-01 DIAGNOSIS — D509 Iron deficiency anemia, unspecified: Secondary | ICD-10-CM | POA: Diagnosis not present

## 2014-07-01 DIAGNOSIS — N186 End stage renal disease: Secondary | ICD-10-CM | POA: Diagnosis not present

## 2014-07-02 DIAGNOSIS — D509 Iron deficiency anemia, unspecified: Secondary | ICD-10-CM | POA: Diagnosis not present

## 2014-07-02 DIAGNOSIS — N186 End stage renal disease: Secondary | ICD-10-CM | POA: Diagnosis not present

## 2014-07-02 DIAGNOSIS — D631 Anemia in chronic kidney disease: Secondary | ICD-10-CM | POA: Diagnosis not present

## 2014-07-03 DIAGNOSIS — D509 Iron deficiency anemia, unspecified: Secondary | ICD-10-CM | POA: Diagnosis not present

## 2014-07-03 DIAGNOSIS — D631 Anemia in chronic kidney disease: Secondary | ICD-10-CM | POA: Diagnosis not present

## 2014-07-03 DIAGNOSIS — N186 End stage renal disease: Secondary | ICD-10-CM | POA: Diagnosis not present

## 2014-07-04 DIAGNOSIS — D509 Iron deficiency anemia, unspecified: Secondary | ICD-10-CM | POA: Diagnosis not present

## 2014-07-04 DIAGNOSIS — N039 Chronic nephritic syndrome with unspecified morphologic changes: Secondary | ICD-10-CM | POA: Diagnosis not present

## 2014-07-04 DIAGNOSIS — N186 End stage renal disease: Secondary | ICD-10-CM | POA: Diagnosis not present

## 2014-07-04 DIAGNOSIS — D631 Anemia in chronic kidney disease: Secondary | ICD-10-CM | POA: Diagnosis not present

## 2014-07-05 DIAGNOSIS — N039 Chronic nephritic syndrome with unspecified morphologic changes: Secondary | ICD-10-CM | POA: Diagnosis not present

## 2014-07-05 DIAGNOSIS — N186 End stage renal disease: Secondary | ICD-10-CM | POA: Diagnosis not present

## 2014-07-05 DIAGNOSIS — D631 Anemia in chronic kidney disease: Secondary | ICD-10-CM | POA: Diagnosis not present

## 2014-07-05 DIAGNOSIS — D509 Iron deficiency anemia, unspecified: Secondary | ICD-10-CM | POA: Diagnosis not present

## 2014-07-06 DIAGNOSIS — N186 End stage renal disease: Secondary | ICD-10-CM | POA: Diagnosis not present

## 2014-07-06 DIAGNOSIS — D509 Iron deficiency anemia, unspecified: Secondary | ICD-10-CM | POA: Diagnosis not present

## 2014-07-06 DIAGNOSIS — D631 Anemia in chronic kidney disease: Secondary | ICD-10-CM | POA: Diagnosis not present

## 2014-07-06 DIAGNOSIS — N039 Chronic nephritic syndrome with unspecified morphologic changes: Secondary | ICD-10-CM | POA: Diagnosis not present

## 2014-07-07 DIAGNOSIS — D509 Iron deficiency anemia, unspecified: Secondary | ICD-10-CM | POA: Diagnosis not present

## 2014-07-07 DIAGNOSIS — D631 Anemia in chronic kidney disease: Secondary | ICD-10-CM | POA: Diagnosis not present

## 2014-07-07 DIAGNOSIS — N186 End stage renal disease: Secondary | ICD-10-CM | POA: Diagnosis not present

## 2014-07-07 DIAGNOSIS — N039 Chronic nephritic syndrome with unspecified morphologic changes: Secondary | ICD-10-CM | POA: Diagnosis not present

## 2014-07-08 DIAGNOSIS — D631 Anemia in chronic kidney disease: Secondary | ICD-10-CM | POA: Diagnosis not present

## 2014-07-08 DIAGNOSIS — D509 Iron deficiency anemia, unspecified: Secondary | ICD-10-CM | POA: Diagnosis not present

## 2014-07-08 DIAGNOSIS — N186 End stage renal disease: Secondary | ICD-10-CM | POA: Diagnosis not present

## 2014-07-08 DIAGNOSIS — N039 Chronic nephritic syndrome with unspecified morphologic changes: Secondary | ICD-10-CM | POA: Diagnosis not present

## 2014-07-09 DIAGNOSIS — D631 Anemia in chronic kidney disease: Secondary | ICD-10-CM | POA: Diagnosis not present

## 2014-07-09 DIAGNOSIS — D509 Iron deficiency anemia, unspecified: Secondary | ICD-10-CM | POA: Diagnosis not present

## 2014-07-09 DIAGNOSIS — N186 End stage renal disease: Secondary | ICD-10-CM | POA: Diagnosis not present

## 2014-07-10 DIAGNOSIS — D631 Anemia in chronic kidney disease: Secondary | ICD-10-CM | POA: Diagnosis not present

## 2014-07-10 DIAGNOSIS — N186 End stage renal disease: Secondary | ICD-10-CM | POA: Diagnosis not present

## 2014-07-10 DIAGNOSIS — D509 Iron deficiency anemia, unspecified: Secondary | ICD-10-CM | POA: Diagnosis not present

## 2014-07-11 DIAGNOSIS — D509 Iron deficiency anemia, unspecified: Secondary | ICD-10-CM | POA: Diagnosis not present

## 2014-07-11 DIAGNOSIS — N186 End stage renal disease: Secondary | ICD-10-CM | POA: Diagnosis not present

## 2014-07-11 DIAGNOSIS — D631 Anemia in chronic kidney disease: Secondary | ICD-10-CM | POA: Diagnosis not present

## 2014-07-12 DIAGNOSIS — N186 End stage renal disease: Secondary | ICD-10-CM | POA: Diagnosis not present

## 2014-07-12 DIAGNOSIS — D509 Iron deficiency anemia, unspecified: Secondary | ICD-10-CM | POA: Diagnosis not present

## 2014-07-12 DIAGNOSIS — D631 Anemia in chronic kidney disease: Secondary | ICD-10-CM | POA: Diagnosis not present

## 2014-07-13 DIAGNOSIS — D509 Iron deficiency anemia, unspecified: Secondary | ICD-10-CM | POA: Diagnosis not present

## 2014-07-13 DIAGNOSIS — N186 End stage renal disease: Secondary | ICD-10-CM | POA: Diagnosis not present

## 2014-07-13 DIAGNOSIS — D631 Anemia in chronic kidney disease: Secondary | ICD-10-CM | POA: Diagnosis not present

## 2014-07-14 DIAGNOSIS — N186 End stage renal disease: Secondary | ICD-10-CM | POA: Diagnosis not present

## 2014-07-14 DIAGNOSIS — D509 Iron deficiency anemia, unspecified: Secondary | ICD-10-CM | POA: Diagnosis not present

## 2014-07-14 DIAGNOSIS — D631 Anemia in chronic kidney disease: Secondary | ICD-10-CM | POA: Diagnosis not present

## 2014-07-15 DIAGNOSIS — D509 Iron deficiency anemia, unspecified: Secondary | ICD-10-CM | POA: Diagnosis not present

## 2014-07-15 DIAGNOSIS — D631 Anemia in chronic kidney disease: Secondary | ICD-10-CM | POA: Diagnosis not present

## 2014-07-15 DIAGNOSIS — N186 End stage renal disease: Secondary | ICD-10-CM | POA: Diagnosis not present

## 2014-07-16 DIAGNOSIS — D509 Iron deficiency anemia, unspecified: Secondary | ICD-10-CM | POA: Diagnosis not present

## 2014-07-16 DIAGNOSIS — N039 Chronic nephritic syndrome with unspecified morphologic changes: Secondary | ICD-10-CM | POA: Diagnosis not present

## 2014-07-16 DIAGNOSIS — D631 Anemia in chronic kidney disease: Secondary | ICD-10-CM | POA: Diagnosis not present

## 2014-07-16 DIAGNOSIS — N186 End stage renal disease: Secondary | ICD-10-CM | POA: Diagnosis not present

## 2014-07-17 DIAGNOSIS — N039 Chronic nephritic syndrome with unspecified morphologic changes: Secondary | ICD-10-CM | POA: Diagnosis not present

## 2014-07-17 DIAGNOSIS — D631 Anemia in chronic kidney disease: Secondary | ICD-10-CM | POA: Diagnosis not present

## 2014-07-17 DIAGNOSIS — N186 End stage renal disease: Secondary | ICD-10-CM | POA: Diagnosis not present

## 2014-07-17 DIAGNOSIS — D509 Iron deficiency anemia, unspecified: Secondary | ICD-10-CM | POA: Diagnosis not present

## 2014-07-18 DIAGNOSIS — D631 Anemia in chronic kidney disease: Secondary | ICD-10-CM | POA: Diagnosis not present

## 2014-07-18 DIAGNOSIS — N039 Chronic nephritic syndrome with unspecified morphologic changes: Secondary | ICD-10-CM | POA: Diagnosis not present

## 2014-07-18 DIAGNOSIS — D509 Iron deficiency anemia, unspecified: Secondary | ICD-10-CM | POA: Diagnosis not present

## 2014-07-18 DIAGNOSIS — N186 End stage renal disease: Secondary | ICD-10-CM | POA: Diagnosis not present

## 2014-07-19 DIAGNOSIS — D631 Anemia in chronic kidney disease: Secondary | ICD-10-CM | POA: Diagnosis not present

## 2014-07-19 DIAGNOSIS — D509 Iron deficiency anemia, unspecified: Secondary | ICD-10-CM | POA: Diagnosis not present

## 2014-07-19 DIAGNOSIS — N186 End stage renal disease: Secondary | ICD-10-CM | POA: Diagnosis not present

## 2014-07-19 DIAGNOSIS — N039 Chronic nephritic syndrome with unspecified morphologic changes: Secondary | ICD-10-CM | POA: Diagnosis not present

## 2014-07-20 DIAGNOSIS — D509 Iron deficiency anemia, unspecified: Secondary | ICD-10-CM | POA: Diagnosis not present

## 2014-07-20 DIAGNOSIS — Z23 Encounter for immunization: Secondary | ICD-10-CM | POA: Diagnosis not present

## 2014-07-20 DIAGNOSIS — D631 Anemia in chronic kidney disease: Secondary | ICD-10-CM | POA: Diagnosis not present

## 2014-07-20 DIAGNOSIS — Z992 Dependence on renal dialysis: Secondary | ICD-10-CM | POA: Diagnosis not present

## 2014-07-20 DIAGNOSIS — N186 End stage renal disease: Secondary | ICD-10-CM | POA: Diagnosis not present

## 2014-07-21 DIAGNOSIS — D509 Iron deficiency anemia, unspecified: Secondary | ICD-10-CM | POA: Diagnosis not present

## 2014-07-21 DIAGNOSIS — D631 Anemia in chronic kidney disease: Secondary | ICD-10-CM | POA: Diagnosis not present

## 2014-07-21 DIAGNOSIS — Z23 Encounter for immunization: Secondary | ICD-10-CM | POA: Diagnosis not present

## 2014-07-21 DIAGNOSIS — Z992 Dependence on renal dialysis: Secondary | ICD-10-CM | POA: Diagnosis not present

## 2014-07-21 DIAGNOSIS — N186 End stage renal disease: Secondary | ICD-10-CM | POA: Diagnosis not present

## 2014-07-22 DIAGNOSIS — D631 Anemia in chronic kidney disease: Secondary | ICD-10-CM | POA: Diagnosis not present

## 2014-07-22 DIAGNOSIS — Z23 Encounter for immunization: Secondary | ICD-10-CM | POA: Diagnosis not present

## 2014-07-22 DIAGNOSIS — N186 End stage renal disease: Secondary | ICD-10-CM | POA: Diagnosis not present

## 2014-07-22 DIAGNOSIS — Z992 Dependence on renal dialysis: Secondary | ICD-10-CM | POA: Diagnosis not present

## 2014-07-22 DIAGNOSIS — D509 Iron deficiency anemia, unspecified: Secondary | ICD-10-CM | POA: Diagnosis not present

## 2014-07-23 DIAGNOSIS — D509 Iron deficiency anemia, unspecified: Secondary | ICD-10-CM | POA: Diagnosis not present

## 2014-07-23 DIAGNOSIS — Z23 Encounter for immunization: Secondary | ICD-10-CM | POA: Diagnosis not present

## 2014-07-23 DIAGNOSIS — Z992 Dependence on renal dialysis: Secondary | ICD-10-CM | POA: Diagnosis not present

## 2014-07-23 DIAGNOSIS — D631 Anemia in chronic kidney disease: Secondary | ICD-10-CM | POA: Diagnosis not present

## 2014-07-23 DIAGNOSIS — N186 End stage renal disease: Secondary | ICD-10-CM | POA: Diagnosis not present

## 2014-07-24 DIAGNOSIS — N186 End stage renal disease: Secondary | ICD-10-CM | POA: Diagnosis not present

## 2014-07-24 DIAGNOSIS — D631 Anemia in chronic kidney disease: Secondary | ICD-10-CM | POA: Diagnosis not present

## 2014-07-24 DIAGNOSIS — Z992 Dependence on renal dialysis: Secondary | ICD-10-CM | POA: Diagnosis not present

## 2014-07-24 DIAGNOSIS — D509 Iron deficiency anemia, unspecified: Secondary | ICD-10-CM | POA: Diagnosis not present

## 2014-07-24 DIAGNOSIS — Z23 Encounter for immunization: Secondary | ICD-10-CM | POA: Diagnosis not present

## 2014-07-25 DIAGNOSIS — D631 Anemia in chronic kidney disease: Secondary | ICD-10-CM | POA: Diagnosis not present

## 2014-07-25 DIAGNOSIS — N186 End stage renal disease: Secondary | ICD-10-CM | POA: Diagnosis not present

## 2014-07-25 DIAGNOSIS — D509 Iron deficiency anemia, unspecified: Secondary | ICD-10-CM | POA: Diagnosis not present

## 2014-07-25 DIAGNOSIS — Z23 Encounter for immunization: Secondary | ICD-10-CM | POA: Diagnosis not present

## 2014-07-25 DIAGNOSIS — Z992 Dependence on renal dialysis: Secondary | ICD-10-CM | POA: Diagnosis not present

## 2014-07-26 DIAGNOSIS — F17200 Nicotine dependence, unspecified, uncomplicated: Secondary | ICD-10-CM | POA: Diagnosis present

## 2014-07-26 DIAGNOSIS — G8929 Other chronic pain: Secondary | ICD-10-CM | POA: Diagnosis present

## 2014-07-26 DIAGNOSIS — I2510 Atherosclerotic heart disease of native coronary artery without angina pectoris: Secondary | ICD-10-CM | POA: Diagnosis present

## 2014-07-26 DIAGNOSIS — J449 Chronic obstructive pulmonary disease, unspecified: Secondary | ICD-10-CM | POA: Diagnosis present

## 2014-07-26 DIAGNOSIS — L03115 Cellulitis of right lower limb: Secondary | ICD-10-CM | POA: Diagnosis not present

## 2014-07-26 DIAGNOSIS — E1152 Type 2 diabetes mellitus with diabetic peripheral angiopathy with gangrene: Secondary | ICD-10-CM | POA: Diagnosis not present

## 2014-07-26 DIAGNOSIS — D649 Anemia, unspecified: Secondary | ICD-10-CM | POA: Diagnosis not present

## 2014-07-26 DIAGNOSIS — Z885 Allergy status to narcotic agent status: Secondary | ICD-10-CM | POA: Diagnosis not present

## 2014-07-26 DIAGNOSIS — I120 Hypertensive chronic kidney disease with stage 5 chronic kidney disease or end stage renal disease: Secondary | ICD-10-CM | POA: Diagnosis present

## 2014-07-26 DIAGNOSIS — E271 Primary adrenocortical insufficiency: Secondary | ICD-10-CM | POA: Diagnosis present

## 2014-07-26 DIAGNOSIS — Z79899 Other long term (current) drug therapy: Secondary | ICD-10-CM | POA: Diagnosis not present

## 2014-07-26 DIAGNOSIS — N186 End stage renal disease: Secondary | ICD-10-CM | POA: Diagnosis present

## 2014-07-26 DIAGNOSIS — L03116 Cellulitis of left lower limb: Secondary | ICD-10-CM | POA: Diagnosis not present

## 2014-07-26 DIAGNOSIS — R531 Weakness: Secondary | ICD-10-CM | POA: Diagnosis not present

## 2014-07-26 DIAGNOSIS — A419 Sepsis, unspecified organism: Secondary | ICD-10-CM | POA: Diagnosis not present

## 2014-07-26 DIAGNOSIS — D62 Acute posthemorrhagic anemia: Secondary | ICD-10-CM | POA: Diagnosis not present

## 2014-07-26 DIAGNOSIS — E1122 Type 2 diabetes mellitus with diabetic chronic kidney disease: Secondary | ICD-10-CM | POA: Diagnosis not present

## 2014-07-26 DIAGNOSIS — I96 Gangrene, not elsewhere classified: Secondary | ICD-10-CM | POA: Diagnosis not present

## 2014-07-26 DIAGNOSIS — E889 Metabolic disorder, unspecified: Secondary | ICD-10-CM | POA: Diagnosis present

## 2014-07-26 DIAGNOSIS — D509 Iron deficiency anemia, unspecified: Secondary | ICD-10-CM | POA: Diagnosis not present

## 2014-07-26 DIAGNOSIS — M545 Low back pain: Secondary | ICD-10-CM | POA: Diagnosis present

## 2014-07-26 DIAGNOSIS — Z952 Presence of prosthetic heart valve: Secondary | ICD-10-CM | POA: Diagnosis not present

## 2014-07-26 DIAGNOSIS — Z23 Encounter for immunization: Secondary | ICD-10-CM | POA: Diagnosis not present

## 2014-07-26 DIAGNOSIS — I739 Peripheral vascular disease, unspecified: Secondary | ICD-10-CM | POA: Diagnosis present

## 2014-07-26 DIAGNOSIS — L02619 Cutaneous abscess of unspecified foot: Secondary | ICD-10-CM | POA: Diagnosis not present

## 2014-07-26 DIAGNOSIS — Z992 Dependence on renal dialysis: Secondary | ICD-10-CM | POA: Diagnosis not present

## 2014-07-26 DIAGNOSIS — Z7901 Long term (current) use of anticoagulants: Secondary | ICD-10-CM | POA: Diagnosis not present

## 2014-07-26 DIAGNOSIS — D631 Anemia in chronic kidney disease: Secondary | ICD-10-CM | POA: Diagnosis not present

## 2014-07-30 DIAGNOSIS — R1310 Dysphagia, unspecified: Secondary | ICD-10-CM | POA: Diagnosis not present

## 2014-07-30 DIAGNOSIS — Z952 Presence of prosthetic heart valve: Secondary | ICD-10-CM | POA: Diagnosis not present

## 2014-07-30 DIAGNOSIS — L03031 Cellulitis of right toe: Secondary | ICD-10-CM | POA: Diagnosis present

## 2014-07-30 DIAGNOSIS — Z905 Acquired absence of kidney: Secondary | ICD-10-CM | POA: Diagnosis present

## 2014-07-30 DIAGNOSIS — D6489 Other specified anemias: Secondary | ICD-10-CM | POA: Diagnosis not present

## 2014-07-30 DIAGNOSIS — L03039 Cellulitis of unspecified toe: Secondary | ICD-10-CM | POA: Diagnosis not present

## 2014-07-30 DIAGNOSIS — J90 Pleural effusion, not elsewhere classified: Secondary | ICD-10-CM | POA: Diagnosis not present

## 2014-07-30 DIAGNOSIS — Z452 Encounter for adjustment and management of vascular access device: Secondary | ICD-10-CM | POA: Diagnosis not present

## 2014-07-30 DIAGNOSIS — F339 Major depressive disorder, recurrent, unspecified: Secondary | ICD-10-CM | POA: Diagnosis not present

## 2014-07-30 DIAGNOSIS — D62 Acute posthemorrhagic anemia: Secondary | ICD-10-CM | POA: Diagnosis not present

## 2014-07-30 DIAGNOSIS — E271 Primary adrenocortical insufficiency: Secondary | ICD-10-CM | POA: Diagnosis not present

## 2014-07-30 DIAGNOSIS — F329 Major depressive disorder, single episode, unspecified: Secondary | ICD-10-CM | POA: Diagnosis present

## 2014-07-30 DIAGNOSIS — K219 Gastro-esophageal reflux disease without esophagitis: Secondary | ICD-10-CM | POA: Diagnosis present

## 2014-07-30 DIAGNOSIS — E8809 Other disorders of plasma-protein metabolism, not elsewhere classified: Secondary | ICD-10-CM | POA: Diagnosis not present

## 2014-07-30 DIAGNOSIS — I509 Heart failure, unspecified: Secondary | ICD-10-CM | POA: Diagnosis present

## 2014-07-30 DIAGNOSIS — I200 Unstable angina: Secondary | ICD-10-CM | POA: Diagnosis present

## 2014-07-30 DIAGNOSIS — I70291 Other atherosclerosis of native arteries of extremities, right leg: Secondary | ICD-10-CM | POA: Diagnosis not present

## 2014-07-30 DIAGNOSIS — Z89519 Acquired absence of unspecified leg below knee: Secondary | ICD-10-CM | POA: Diagnosis not present

## 2014-07-30 DIAGNOSIS — I120 Hypertensive chronic kidney disease with stage 5 chronic kidney disease or end stage renal disease: Secondary | ICD-10-CM | POA: Diagnosis not present

## 2014-07-30 DIAGNOSIS — E079 Disorder of thyroid, unspecified: Secondary | ICD-10-CM | POA: Diagnosis present

## 2014-07-30 DIAGNOSIS — G8918 Other acute postprocedural pain: Secondary | ICD-10-CM | POA: Diagnosis not present

## 2014-07-30 DIAGNOSIS — Z992 Dependence on renal dialysis: Secondary | ICD-10-CM | POA: Diagnosis not present

## 2014-07-30 DIAGNOSIS — I350 Nonrheumatic aortic (valve) stenosis: Secondary | ICD-10-CM | POA: Diagnosis not present

## 2014-07-30 DIAGNOSIS — I7389 Other specified peripheral vascular diseases: Secondary | ICD-10-CM | POA: Diagnosis present

## 2014-07-30 DIAGNOSIS — M13862 Other specified arthritis, left knee: Secondary | ICD-10-CM | POA: Diagnosis present

## 2014-07-30 DIAGNOSIS — L03115 Cellulitis of right lower limb: Secondary | ICD-10-CM | POA: Diagnosis not present

## 2014-07-30 DIAGNOSIS — A419 Sepsis, unspecified organism: Secondary | ICD-10-CM | POA: Diagnosis not present

## 2014-07-30 DIAGNOSIS — I96 Gangrene, not elsewhere classified: Secondary | ICD-10-CM | POA: Diagnosis not present

## 2014-07-30 DIAGNOSIS — M79662 Pain in left lower leg: Secondary | ICD-10-CM | POA: Diagnosis not present

## 2014-07-30 DIAGNOSIS — D631 Anemia in chronic kidney disease: Secondary | ICD-10-CM | POA: Diagnosis not present

## 2014-07-30 DIAGNOSIS — Z89512 Acquired absence of left leg below knee: Secondary | ICD-10-CM | POA: Diagnosis not present

## 2014-07-30 DIAGNOSIS — M13861 Other specified arthritis, right knee: Secondary | ICD-10-CM | POA: Diagnosis present

## 2014-07-30 DIAGNOSIS — D72829 Elevated white blood cell count, unspecified: Secondary | ICD-10-CM | POA: Diagnosis not present

## 2014-07-30 DIAGNOSIS — J9811 Atelectasis: Secondary | ICD-10-CM | POA: Diagnosis not present

## 2014-07-30 DIAGNOSIS — Z7409 Other reduced mobility: Secondary | ICD-10-CM | POA: Diagnosis not present

## 2014-07-30 DIAGNOSIS — I517 Cardiomegaly: Secondary | ICD-10-CM | POA: Diagnosis not present

## 2014-07-30 DIAGNOSIS — Z7952 Long term (current) use of systemic steroids: Secondary | ICD-10-CM | POA: Diagnosis not present

## 2014-07-30 DIAGNOSIS — Z89511 Acquired absence of right leg below knee: Secondary | ICD-10-CM | POA: Diagnosis not present

## 2014-07-30 DIAGNOSIS — F39 Unspecified mood [affective] disorder: Secondary | ICD-10-CM | POA: Diagnosis present

## 2014-07-30 DIAGNOSIS — I70261 Atherosclerosis of native arteries of extremities with gangrene, right leg: Secondary | ICD-10-CM | POA: Diagnosis not present

## 2014-07-30 DIAGNOSIS — L03116 Cellulitis of left lower limb: Secondary | ICD-10-CM | POA: Diagnosis not present

## 2014-07-30 DIAGNOSIS — J449 Chronic obstructive pulmonary disease, unspecified: Secondary | ICD-10-CM | POA: Diagnosis not present

## 2014-07-30 DIAGNOSIS — L03032 Cellulitis of left toe: Secondary | ICD-10-CM | POA: Diagnosis present

## 2014-07-30 DIAGNOSIS — Z7901 Long term (current) use of anticoagulants: Secondary | ICD-10-CM | POA: Diagnosis not present

## 2014-07-30 DIAGNOSIS — I70262 Atherosclerosis of native arteries of extremities with gangrene, left leg: Secondary | ICD-10-CM | POA: Diagnosis not present

## 2014-07-30 DIAGNOSIS — M62561 Muscle wasting and atrophy, not elsewhere classified, right lower leg: Secondary | ICD-10-CM | POA: Diagnosis not present

## 2014-07-30 DIAGNOSIS — F17210 Nicotine dependence, cigarettes, uncomplicated: Secondary | ICD-10-CM | POA: Diagnosis present

## 2014-07-30 DIAGNOSIS — G8911 Acute pain due to trauma: Secondary | ICD-10-CM | POA: Diagnosis not present

## 2014-07-30 DIAGNOSIS — I998 Other disorder of circulatory system: Secondary | ICD-10-CM | POA: Diagnosis not present

## 2014-07-30 DIAGNOSIS — S98012A Complete traumatic amputation of left foot at ankle level, initial encounter: Secondary | ICD-10-CM | POA: Diagnosis not present

## 2014-07-30 DIAGNOSIS — I129 Hypertensive chronic kidney disease with stage 1 through stage 4 chronic kidney disease, or unspecified chronic kidney disease: Secondary | ICD-10-CM | POA: Diagnosis not present

## 2014-07-30 DIAGNOSIS — N186 End stage renal disease: Secondary | ICD-10-CM | POA: Diagnosis present

## 2014-07-30 DIAGNOSIS — F419 Anxiety disorder, unspecified: Secondary | ICD-10-CM | POA: Diagnosis present

## 2014-07-30 DIAGNOSIS — I10 Essential (primary) hypertension: Secondary | ICD-10-CM | POA: Diagnosis not present

## 2014-07-30 DIAGNOSIS — I739 Peripheral vascular disease, unspecified: Secondary | ICD-10-CM | POA: Diagnosis not present

## 2014-07-30 DIAGNOSIS — D649 Anemia, unspecified: Secondary | ICD-10-CM | POA: Diagnosis not present

## 2014-07-30 DIAGNOSIS — M79661 Pain in right lower leg: Secondary | ICD-10-CM | POA: Diagnosis not present

## 2014-07-30 DIAGNOSIS — R21 Rash and other nonspecific skin eruption: Secondary | ICD-10-CM | POA: Diagnosis not present

## 2014-07-30 DIAGNOSIS — R0989 Other specified symptoms and signs involving the circulatory and respiratory systems: Secondary | ICD-10-CM | POA: Diagnosis not present

## 2014-07-30 DIAGNOSIS — D638 Anemia in other chronic diseases classified elsewhere: Secondary | ICD-10-CM | POA: Diagnosis not present

## 2014-08-10 IMAGING — CR DG CHEST 1V PORT
1 series · 1 of 1 positions shown · non-contrast
Comparison: August 01, 2013

CLINICAL DATA: Chest pain and hypertension

EXAM:
PORTABLE CHEST - 1 VIEW

[AP]
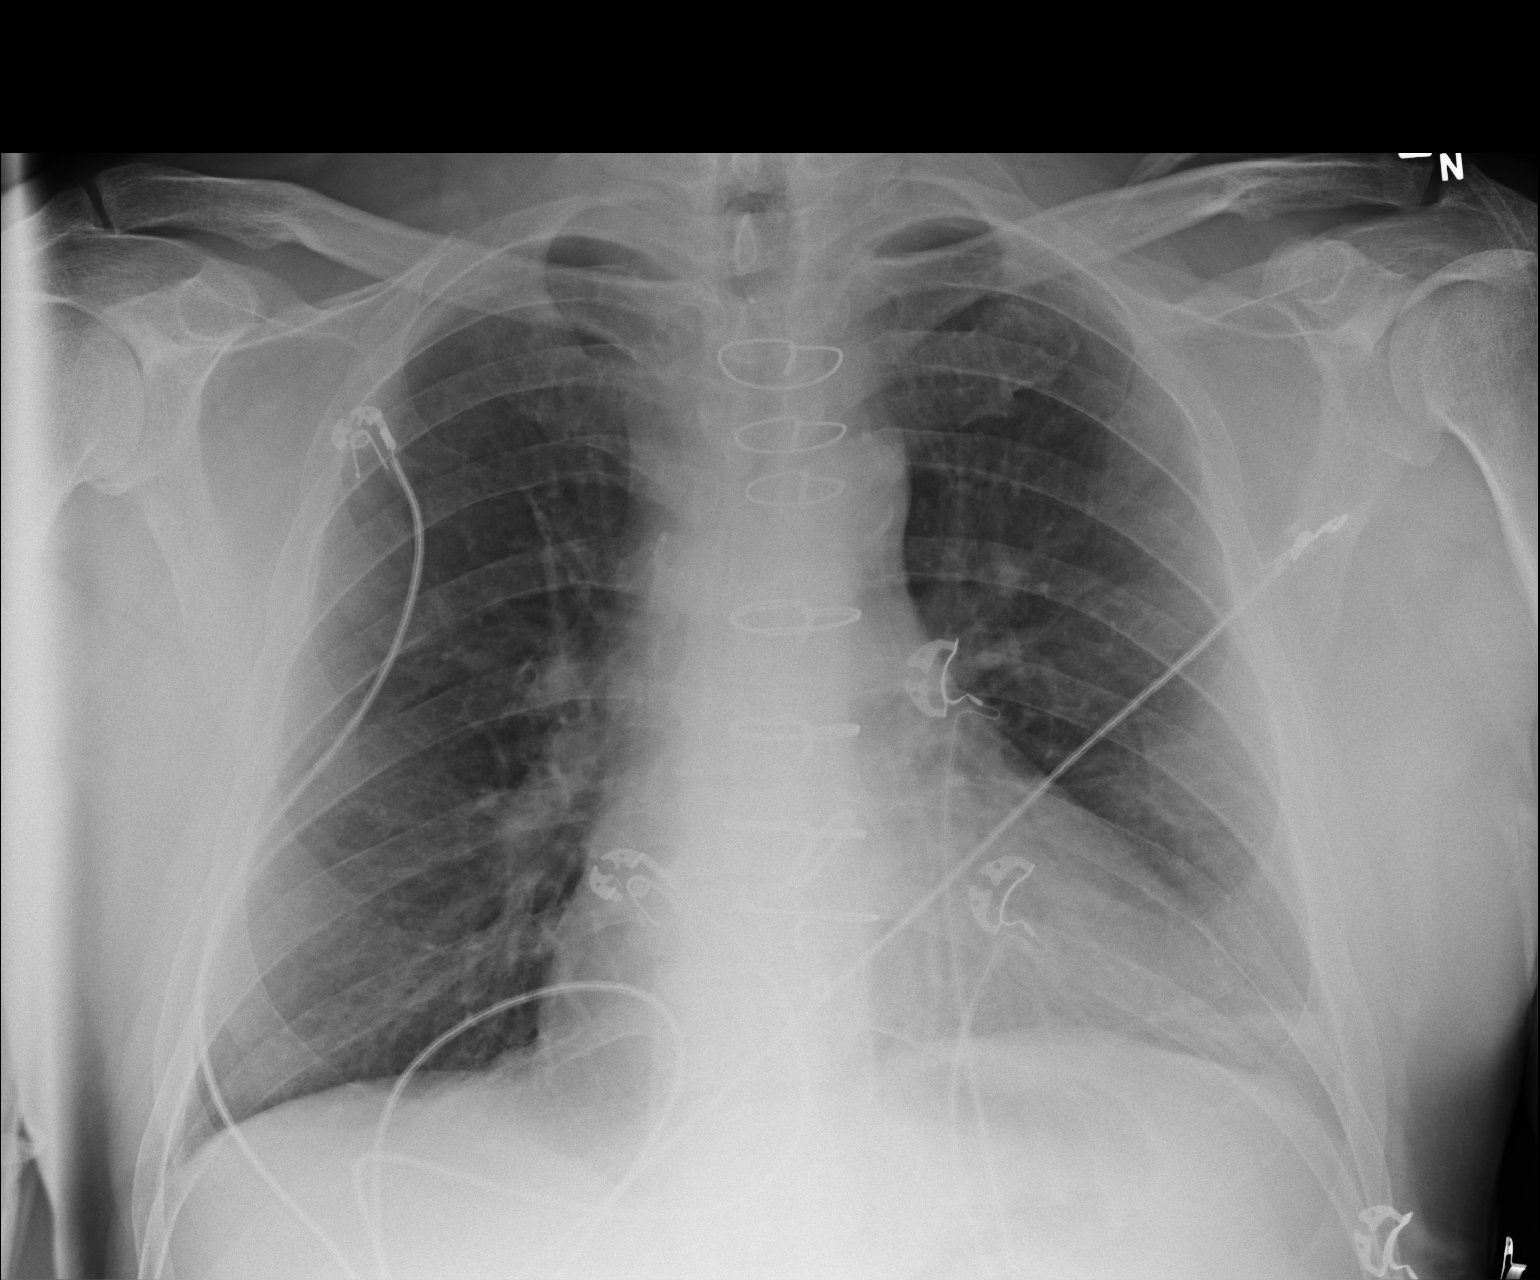

[1 of 1 positions shown; findings below may reference images not displayed]

FINDINGS: There is underlying emphysematous change. There is a small area of
patchy infiltrate in the left base. Lungs elsewhere are clear. Heart
is mildly enlarged. The pulmonary vascularity is within normal
limits. No adenopathy. Patient is status post aortic valve
replacement. No pneumothorax. No bone lesions.
IMPRESSION: There is a degree of underlying emphysematous change. There is a
small area of patchy infiltrate in the left base. Elsewhere, lungs
are clear. Heart mildly enlarged but stable.

## 2014-08-11 IMAGING — CR DG CHEST 2V
2 series · 2 of 2 positions shown · non-contrast
Comparison: Portable chest x-ray at October 26, 2013.

CLINICAL DATA: Dyspnea and chest pain

EXAM:
CHEST  2 VIEW

[w chest pa]
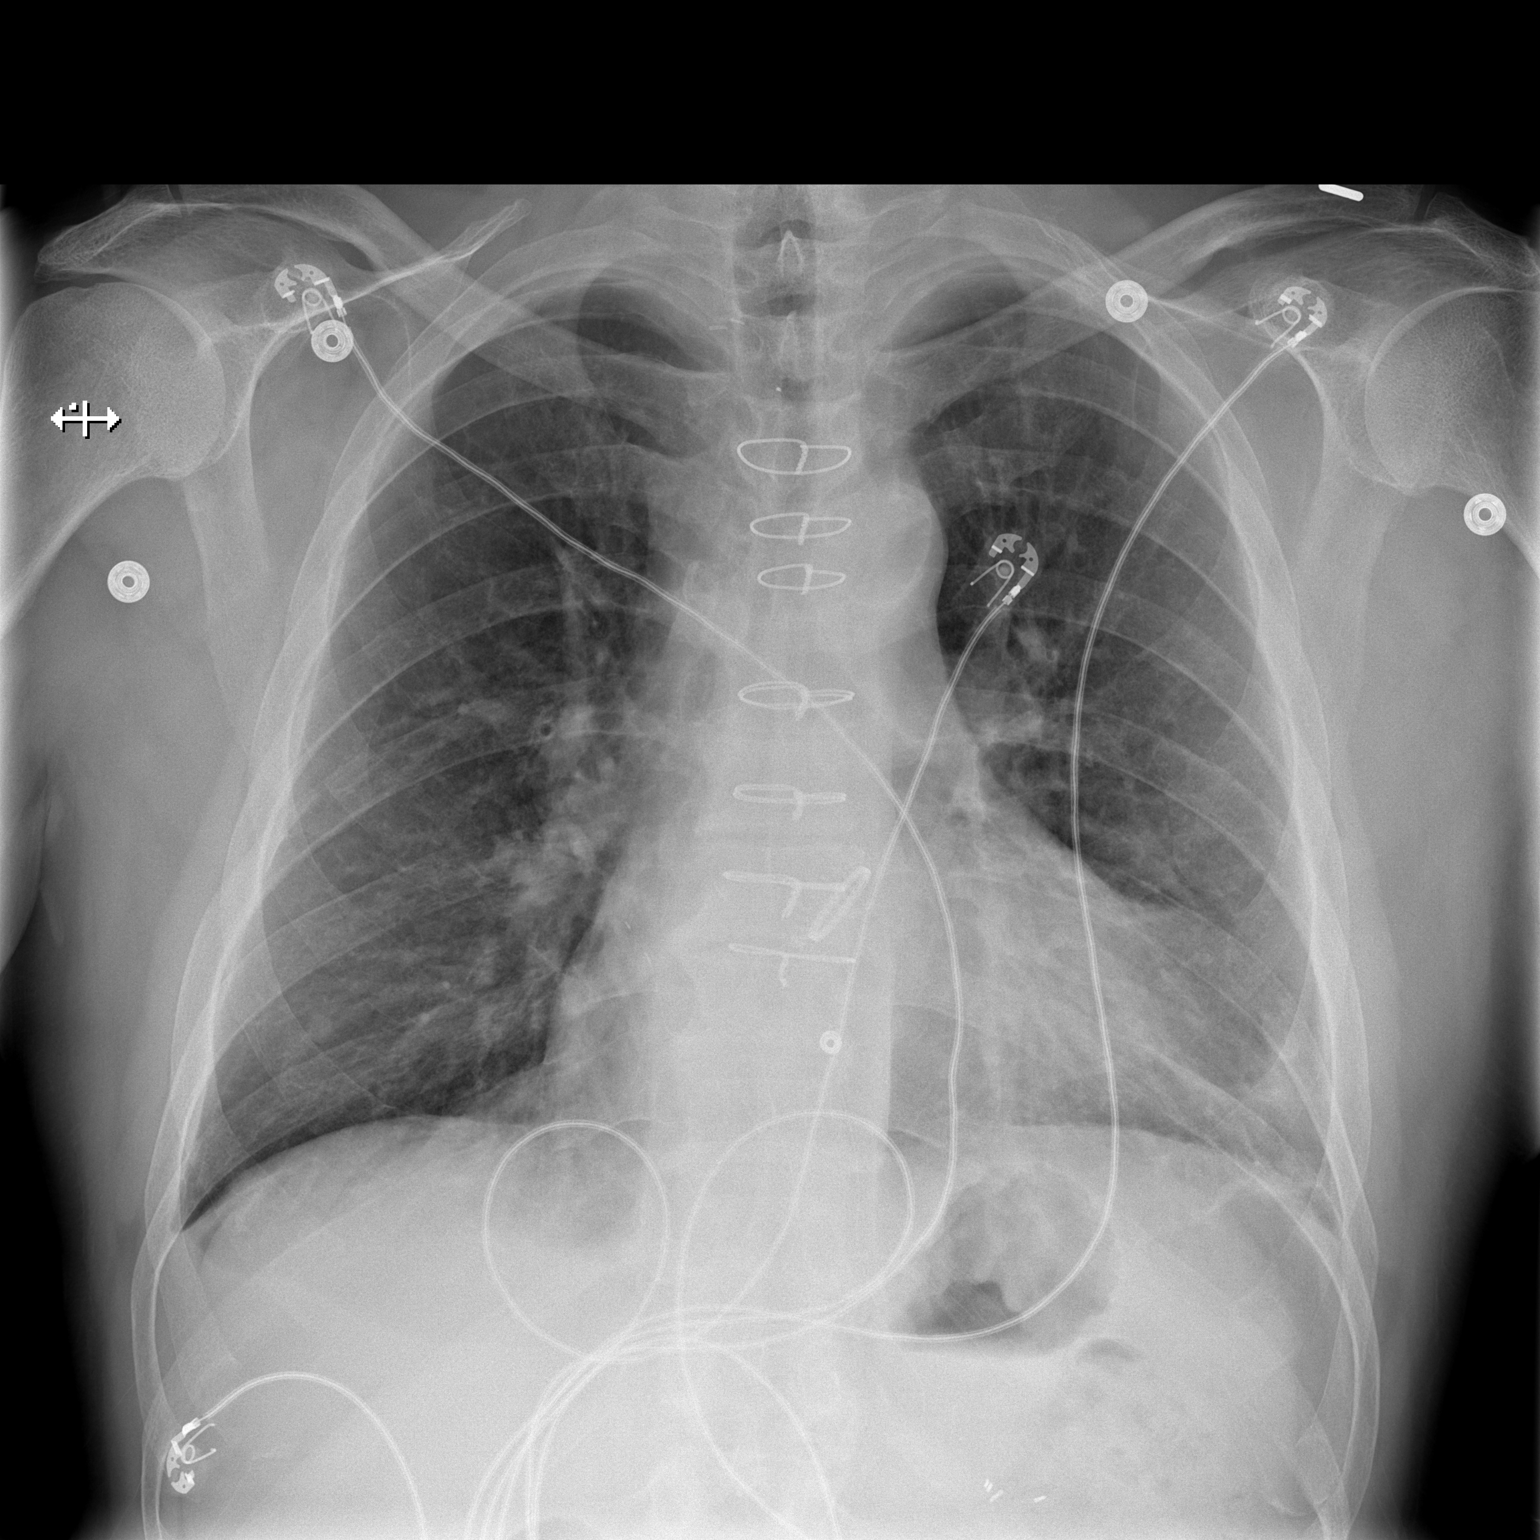

[w chest lat]
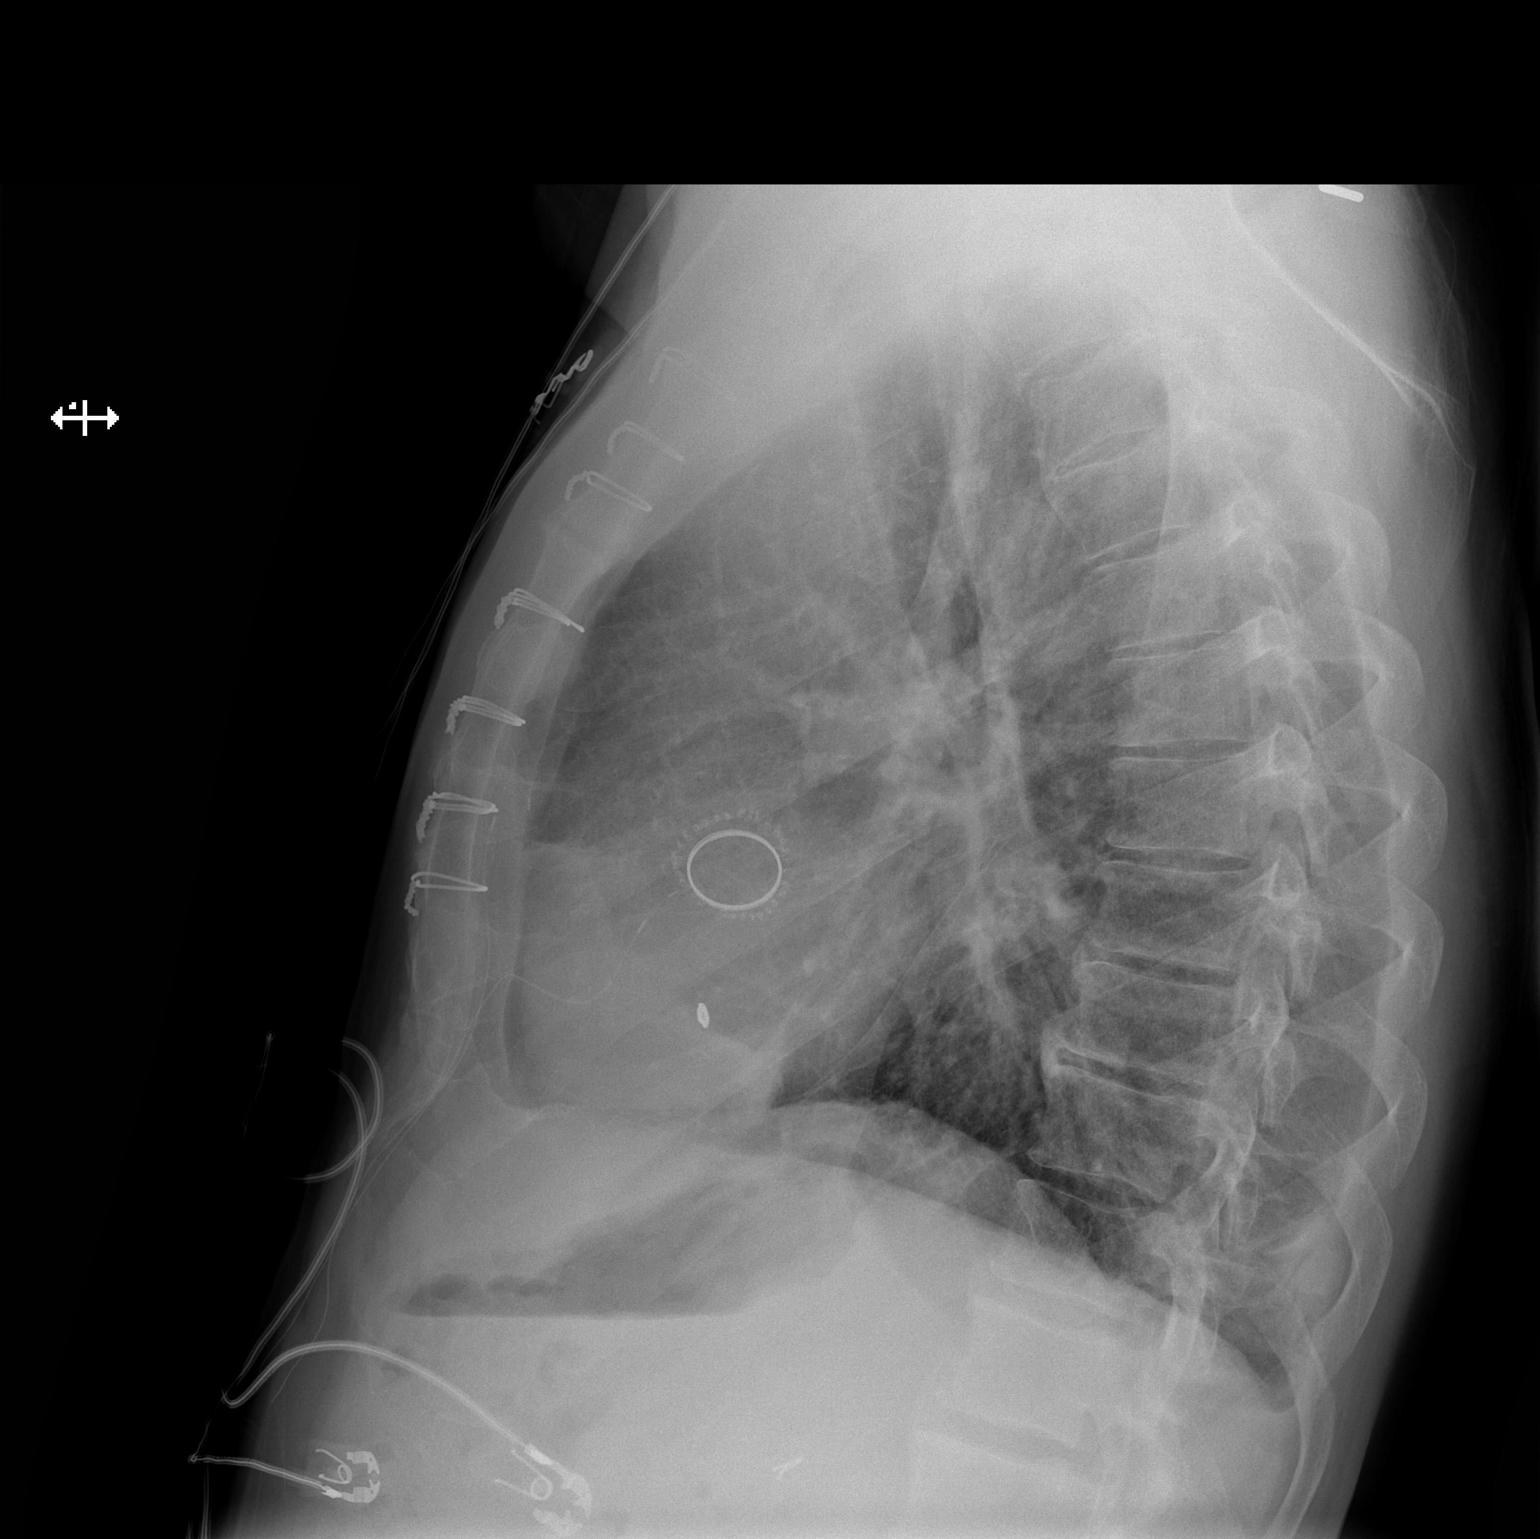

[2 of 2 positions shown; findings below may reference images not displayed]

FINDINGS: The lungs are well-expanded. The interstitial markings bilaterally
are slightly more conspicuous today. There is persistent abnormally
increased density at the left lung base anteriorly. The
cardiopericardial silhouette is top-normal in size. The central
pulmonary vascularity is minimally prominent but stable. There is no
evidence of cephalization of the vascularity nor is there
interstitial edema. There is a metallic ring in the aortic position.
IMPRESSION: There is persistent increased density at the left lung base
consistent with subsegmental atelectasis or early pneumonia. There
is no definite evidence of CHF.

## 2014-08-14 DIAGNOSIS — I739 Peripheral vascular disease, unspecified: Secondary | ICD-10-CM | POA: Diagnosis not present

## 2014-08-14 DIAGNOSIS — Z992 Dependence on renal dialysis: Secondary | ICD-10-CM | POA: Diagnosis not present

## 2014-08-14 DIAGNOSIS — I120 Hypertensive chronic kidney disease with stage 5 chronic kidney disease or end stage renal disease: Secondary | ICD-10-CM | POA: Diagnosis not present

## 2014-08-14 DIAGNOSIS — E271 Primary adrenocortical insufficiency: Secondary | ICD-10-CM | POA: Diagnosis not present

## 2014-08-14 DIAGNOSIS — D62 Acute posthemorrhagic anemia: Secondary | ICD-10-CM | POA: Diagnosis not present

## 2014-08-14 DIAGNOSIS — J449 Chronic obstructive pulmonary disease, unspecified: Secondary | ICD-10-CM | POA: Diagnosis not present

## 2014-08-14 DIAGNOSIS — D509 Iron deficiency anemia, unspecified: Secondary | ICD-10-CM | POA: Diagnosis not present

## 2014-08-14 DIAGNOSIS — I10 Essential (primary) hypertension: Secondary | ICD-10-CM | POA: Diagnosis not present

## 2014-08-14 DIAGNOSIS — Z89511 Acquired absence of right leg below knee: Secondary | ICD-10-CM | POA: Diagnosis not present

## 2014-08-14 DIAGNOSIS — D631 Anemia in chronic kidney disease: Secondary | ICD-10-CM | POA: Diagnosis not present

## 2014-08-14 DIAGNOSIS — Z89512 Acquired absence of left leg below knee: Secondary | ICD-10-CM | POA: Diagnosis not present

## 2014-08-14 DIAGNOSIS — I350 Nonrheumatic aortic (valve) stenosis: Secondary | ICD-10-CM | POA: Diagnosis not present

## 2014-08-14 DIAGNOSIS — N2581 Secondary hyperparathyroidism of renal origin: Secondary | ICD-10-CM | POA: Diagnosis not present

## 2014-08-14 DIAGNOSIS — Z89519 Acquired absence of unspecified leg below knee: Secondary | ICD-10-CM | POA: Diagnosis not present

## 2014-08-14 DIAGNOSIS — F339 Major depressive disorder, recurrent, unspecified: Secondary | ICD-10-CM | POA: Diagnosis not present

## 2014-08-14 DIAGNOSIS — I96 Gangrene, not elsewhere classified: Secondary | ICD-10-CM | POA: Diagnosis not present

## 2014-08-14 DIAGNOSIS — N186 End stage renal disease: Secondary | ICD-10-CM | POA: Diagnosis not present

## 2014-08-14 DIAGNOSIS — Z7409 Other reduced mobility: Secondary | ICD-10-CM | POA: Diagnosis not present

## 2014-08-14 DIAGNOSIS — Q238 Other congenital malformations of aortic and mitral valves: Secondary | ICD-10-CM | POA: Diagnosis not present

## 2014-08-14 DIAGNOSIS — I998 Other disorder of circulatory system: Secondary | ICD-10-CM | POA: Diagnosis not present

## 2014-08-15 DIAGNOSIS — N2581 Secondary hyperparathyroidism of renal origin: Secondary | ICD-10-CM | POA: Diagnosis not present

## 2014-08-15 DIAGNOSIS — D631 Anemia in chronic kidney disease: Secondary | ICD-10-CM | POA: Diagnosis not present

## 2014-08-15 DIAGNOSIS — Z992 Dependence on renal dialysis: Secondary | ICD-10-CM | POA: Diagnosis not present

## 2014-08-15 DIAGNOSIS — N186 End stage renal disease: Secondary | ICD-10-CM | POA: Diagnosis not present

## 2014-08-15 DIAGNOSIS — D509 Iron deficiency anemia, unspecified: Secondary | ICD-10-CM | POA: Diagnosis not present

## 2014-08-16 DIAGNOSIS — Z992 Dependence on renal dialysis: Secondary | ICD-10-CM | POA: Diagnosis not present

## 2014-08-16 DIAGNOSIS — Q238 Other congenital malformations of aortic and mitral valves: Secondary | ICD-10-CM | POA: Diagnosis not present

## 2014-08-16 DIAGNOSIS — I739 Peripheral vascular disease, unspecified: Secondary | ICD-10-CM | POA: Diagnosis not present

## 2014-08-16 DIAGNOSIS — N186 End stage renal disease: Secondary | ICD-10-CM | POA: Diagnosis not present

## 2014-08-20 ENCOUNTER — Encounter (HOSPITAL_COMMUNITY): Payer: Self-pay | Admitting: Emergency Medicine

## 2014-08-20 ENCOUNTER — Emergency Department (HOSPITAL_COMMUNITY): Payer: Medicare Other

## 2014-08-20 ENCOUNTER — Other Ambulatory Visit: Payer: Self-pay

## 2014-08-20 ENCOUNTER — Emergency Department (HOSPITAL_COMMUNITY)
Admission: EM | Admit: 2014-08-20 | Discharge: 2014-08-20 | Disposition: A | Discharge status: Other Acute Inpatient Hospital | Payer: Medicare Other | Attending: Emergency Medicine | Admitting: Emergency Medicine

## 2014-08-20 DIAGNOSIS — Z8639 Personal history of other endocrine, nutritional and metabolic disease: Secondary | ICD-10-CM | POA: Diagnosis not present

## 2014-08-20 DIAGNOSIS — D521 Drug-induced folate deficiency anemia: Secondary | ICD-10-CM

## 2014-08-20 DIAGNOSIS — R9431 Abnormal electrocardiogram [ECG] [EKG]: Secondary | ICD-10-CM | POA: Diagnosis not present

## 2014-08-20 DIAGNOSIS — D500 Iron deficiency anemia secondary to blood loss (chronic): Secondary | ICD-10-CM | POA: Diagnosis not present

## 2014-08-20 DIAGNOSIS — Z89512 Acquired absence of left leg below knee: Secondary | ICD-10-CM | POA: Diagnosis not present

## 2014-08-20 DIAGNOSIS — D649 Anemia, unspecified: Secondary | ICD-10-CM | POA: Diagnosis present

## 2014-08-20 DIAGNOSIS — I120 Hypertensive chronic kidney disease with stage 5 chronic kidney disease or end stage renal disease: Secondary | ICD-10-CM | POA: Diagnosis not present

## 2014-08-20 DIAGNOSIS — N186 End stage renal disease: Secondary | ICD-10-CM | POA: Diagnosis present

## 2014-08-20 DIAGNOSIS — F329 Major depressive disorder, single episode, unspecified: Secondary | ICD-10-CM | POA: Diagnosis not present

## 2014-08-20 DIAGNOSIS — G894 Chronic pain syndrome: Secondary | ICD-10-CM | POA: Diagnosis not present

## 2014-08-20 DIAGNOSIS — I10 Essential (primary) hypertension: Secondary | ICD-10-CM | POA: Diagnosis not present

## 2014-08-20 DIAGNOSIS — Z87442 Personal history of urinary calculi: Secondary | ICD-10-CM | POA: Insufficient documentation

## 2014-08-20 DIAGNOSIS — I444 Left anterior fascicular block: Secondary | ICD-10-CM | POA: Diagnosis not present

## 2014-08-20 DIAGNOSIS — L8995 Pressure ulcer of unspecified site, unstageable: Secondary | ICD-10-CM | POA: Diagnosis present

## 2014-08-20 DIAGNOSIS — Z89519 Acquired absence of unspecified leg below knee: Secondary | ICD-10-CM | POA: Diagnosis not present

## 2014-08-20 DIAGNOSIS — J449 Chronic obstructive pulmonary disease, unspecified: Secondary | ICD-10-CM | POA: Diagnosis not present

## 2014-08-20 DIAGNOSIS — G546 Phantom limb syndrome with pain: Secondary | ICD-10-CM | POA: Diagnosis not present

## 2014-08-20 DIAGNOSIS — K921 Melena: Secondary | ICD-10-CM | POA: Diagnosis present

## 2014-08-20 DIAGNOSIS — I340 Nonrheumatic mitral (valve) insufficiency: Secondary | ICD-10-CM | POA: Diagnosis not present

## 2014-08-20 DIAGNOSIS — Z8673 Personal history of transient ischemic attack (TIA), and cerebral infarction without residual deficits: Secondary | ICD-10-CM | POA: Insufficient documentation

## 2014-08-20 DIAGNOSIS — M25569 Pain in unspecified knee: Secondary | ICD-10-CM | POA: Diagnosis not present

## 2014-08-20 DIAGNOSIS — R5082 Postprocedural fever: Secondary | ICD-10-CM

## 2014-08-20 DIAGNOSIS — E271 Primary adrenocortical insufficiency: Secondary | ICD-10-CM | POA: Diagnosis not present

## 2014-08-20 DIAGNOSIS — J810 Acute pulmonary edema: Secondary | ICD-10-CM | POA: Diagnosis not present

## 2014-08-20 DIAGNOSIS — I739 Peripheral vascular disease, unspecified: Secondary | ICD-10-CM | POA: Diagnosis not present

## 2014-08-20 DIAGNOSIS — D631 Anemia in chronic kidney disease: Secondary | ICD-10-CM | POA: Diagnosis not present

## 2014-08-20 DIAGNOSIS — K922 Gastrointestinal hemorrhage, unspecified: Secondary | ICD-10-CM | POA: Diagnosis not present

## 2014-08-20 DIAGNOSIS — I361 Nonrheumatic tricuspid (valve) insufficiency: Secondary | ICD-10-CM | POA: Diagnosis not present

## 2014-08-20 DIAGNOSIS — R5081 Fever presenting with conditions classified elsewhere: Secondary | ICD-10-CM | POA: Insufficient documentation

## 2014-08-20 DIAGNOSIS — D123 Benign neoplasm of transverse colon: Secondary | ICD-10-CM | POA: Diagnosis not present

## 2014-08-20 DIAGNOSIS — I517 Cardiomegaly: Secondary | ICD-10-CM | POA: Diagnosis not present

## 2014-08-20 DIAGNOSIS — J440 Chronic obstructive pulmonary disease with acute lower respiratory infection: Secondary | ICD-10-CM | POA: Diagnosis present

## 2014-08-20 DIAGNOSIS — Z992 Dependence on renal dialysis: Secondary | ICD-10-CM | POA: Diagnosis not present

## 2014-08-20 DIAGNOSIS — T827XXA Infection and inflammatory reaction due to other cardiac and vascular devices, implants and grafts, initial encounter: Secondary | ICD-10-CM | POA: Diagnosis not present

## 2014-08-20 DIAGNOSIS — D62 Acute posthemorrhagic anemia: Secondary | ICD-10-CM

## 2014-08-20 DIAGNOSIS — Z720 Tobacco use: Secondary | ICD-10-CM | POA: Insufficient documentation

## 2014-08-20 DIAGNOSIS — Z7901 Long term (current) use of anticoagulants: Secondary | ICD-10-CM | POA: Diagnosis not present

## 2014-08-20 DIAGNOSIS — E875 Hyperkalemia: Secondary | ICD-10-CM | POA: Diagnosis not present

## 2014-08-20 DIAGNOSIS — E2740 Unspecified adrenocortical insufficiency: Secondary | ICD-10-CM | POA: Diagnosis present

## 2014-08-20 DIAGNOSIS — J189 Pneumonia, unspecified organism: Secondary | ICD-10-CM | POA: Diagnosis not present

## 2014-08-20 DIAGNOSIS — Z794 Long term (current) use of insulin: Secondary | ICD-10-CM | POA: Diagnosis not present

## 2014-08-20 DIAGNOSIS — I953 Hypotension of hemodialysis: Secondary | ICD-10-CM | POA: Diagnosis not present

## 2014-08-20 DIAGNOSIS — Z954 Presence of other heart-valve replacement: Secondary | ICD-10-CM | POA: Diagnosis not present

## 2014-08-20 DIAGNOSIS — R071 Chest pain on breathing: Secondary | ICD-10-CM | POA: Diagnosis not present

## 2014-08-20 DIAGNOSIS — J159 Unspecified bacterial pneumonia: Secondary | ICD-10-CM | POA: Diagnosis not present

## 2014-08-20 DIAGNOSIS — Z79899 Other long term (current) drug therapy: Secondary | ICD-10-CM | POA: Diagnosis not present

## 2014-08-20 DIAGNOSIS — Z905 Acquired absence of kidney: Secondary | ICD-10-CM | POA: Diagnosis not present

## 2014-08-20 DIAGNOSIS — J9691 Respiratory failure, unspecified with hypoxia: Secondary | ICD-10-CM | POA: Diagnosis not present

## 2014-08-20 DIAGNOSIS — J690 Pneumonitis due to inhalation of food and vomit: Secondary | ICD-10-CM | POA: Diagnosis not present

## 2014-08-20 DIAGNOSIS — R195 Other fecal abnormalities: Secondary | ICD-10-CM | POA: Diagnosis not present

## 2014-08-20 DIAGNOSIS — R079 Chest pain, unspecified: Secondary | ICD-10-CM

## 2014-08-20 DIAGNOSIS — Z9981 Dependence on supplemental oxygen: Secondary | ICD-10-CM | POA: Diagnosis not present

## 2014-08-20 DIAGNOSIS — I350 Nonrheumatic aortic (valve) stenosis: Secondary | ICD-10-CM | POA: Diagnosis not present

## 2014-08-20 DIAGNOSIS — R6520 Severe sepsis without septic shock: Secondary | ICD-10-CM | POA: Diagnosis not present

## 2014-08-20 DIAGNOSIS — J984 Other disorders of lung: Secondary | ICD-10-CM | POA: Diagnosis not present

## 2014-08-20 DIAGNOSIS — R0602 Shortness of breath: Secondary | ICD-10-CM | POA: Diagnosis not present

## 2014-08-20 DIAGNOSIS — D125 Benign neoplasm of sigmoid colon: Secondary | ICD-10-CM | POA: Diagnosis not present

## 2014-08-20 DIAGNOSIS — Z89511 Acquired absence of right leg below knee: Secondary | ICD-10-CM | POA: Diagnosis not present

## 2014-08-20 DIAGNOSIS — R1110 Vomiting, unspecified: Secondary | ICD-10-CM | POA: Diagnosis not present

## 2014-08-20 DIAGNOSIS — I351 Nonrheumatic aortic (valve) insufficiency: Secondary | ICD-10-CM | POA: Diagnosis not present

## 2014-08-20 DIAGNOSIS — F419 Anxiety disorder, unspecified: Secondary | ICD-10-CM | POA: Diagnosis not present

## 2014-08-20 DIAGNOSIS — J90 Pleural effusion, not elsewhere classified: Secondary | ICD-10-CM | POA: Diagnosis not present

## 2014-08-20 DIAGNOSIS — R918 Other nonspecific abnormal finding of lung field: Secondary | ICD-10-CM | POA: Diagnosis not present

## 2014-08-20 DIAGNOSIS — I447 Left bundle-branch block, unspecified: Secondary | ICD-10-CM | POA: Diagnosis not present

## 2014-08-20 DIAGNOSIS — Z7982 Long term (current) use of aspirin: Secondary | ICD-10-CM | POA: Diagnosis not present

## 2014-08-20 DIAGNOSIS — R000 Tachycardia, unspecified: Secondary | ICD-10-CM | POA: Diagnosis not present

## 2014-08-20 DIAGNOSIS — J9601 Acute respiratory failure with hypoxia: Secondary | ICD-10-CM | POA: Diagnosis not present

## 2014-08-20 DIAGNOSIS — I4581 Long QT syndrome: Secondary | ICD-10-CM | POA: Diagnosis not present

## 2014-08-20 DIAGNOSIS — R509 Fever, unspecified: Secondary | ICD-10-CM

## 2014-08-20 DIAGNOSIS — I509 Heart failure, unspecified: Secondary | ICD-10-CM | POA: Diagnosis not present

## 2014-08-20 DIAGNOSIS — F17210 Nicotine dependence, cigarettes, uncomplicated: Secondary | ICD-10-CM | POA: Diagnosis present

## 2014-08-20 DIAGNOSIS — A419 Sepsis, unspecified organism: Secondary | ICD-10-CM | POA: Diagnosis not present

## 2014-08-20 DIAGNOSIS — Z952 Presence of prosthetic heart valve: Secondary | ICD-10-CM | POA: Diagnosis not present

## 2014-08-20 DIAGNOSIS — R571 Hypovolemic shock: Secondary | ICD-10-CM | POA: Diagnosis present

## 2014-08-20 DIAGNOSIS — R0789 Other chest pain: Secondary | ICD-10-CM | POA: Diagnosis not present

## 2014-08-20 DIAGNOSIS — K5790 Diverticulosis of intestine, part unspecified, without perforation or abscess without bleeding: Secondary | ICD-10-CM | POA: Diagnosis not present

## 2014-08-20 DIAGNOSIS — J918 Pleural effusion in other conditions classified elsewhere: Secondary | ICD-10-CM | POA: Diagnosis not present

## 2014-08-20 DIAGNOSIS — I959 Hypotension, unspecified: Secondary | ICD-10-CM | POA: Diagnosis not present

## 2014-08-20 DIAGNOSIS — E872 Acidosis: Secondary | ICD-10-CM | POA: Diagnosis not present

## 2014-08-20 DIAGNOSIS — E8770 Fluid overload, unspecified: Secondary | ICD-10-CM | POA: Diagnosis not present

## 2014-08-20 DIAGNOSIS — R0600 Dyspnea, unspecified: Secondary | ICD-10-CM | POA: Diagnosis not present

## 2014-08-20 LAB — CBC WITH DIFFERENTIAL/PLATELET
Basophils Absolute: 0.2 10*3/uL — ABNORMAL HIGH (ref 0.0–0.1)
Basophils Relative: 1 % (ref 0–1)
EOS ABS: 0.3 10*3/uL (ref 0.0–0.7)
Eosinophils Relative: 2 % (ref 0–5)
HCT: 16.4 % — ABNORMAL LOW (ref 39.0–52.0)
Hemoglobin: 5.1 g/dL — CL (ref 13.0–17.0)
LYMPHS PCT: 8 % — AB (ref 12–46)
Lymphs Abs: 1.3 10*3/uL (ref 0.7–4.0)
MCH: 29.1 pg (ref 26.0–34.0)
MCHC: 31.1 g/dL (ref 30.0–36.0)
MCV: 93.7 fL (ref 78.0–100.0)
MONO ABS: 1.7 10*3/uL — AB (ref 0.1–1.0)
Monocytes Relative: 11 % (ref 3–12)
NEUTROS PCT: 78 % — AB (ref 43–77)
Neutro Abs: 12.2 10*3/uL — ABNORMAL HIGH (ref 1.7–7.7)
PLATELETS: 232 10*3/uL (ref 150–400)
RBC: 1.75 MIL/uL — ABNORMAL LOW (ref 4.22–5.81)
RDW: 20.5 % — ABNORMAL HIGH (ref 11.5–15.5)
WBC: 15.7 10*3/uL — ABNORMAL HIGH (ref 4.0–10.5)

## 2014-08-20 LAB — COMPREHENSIVE METABOLIC PANEL
ALT: 5 U/L (ref 0–53)
AST: 24 U/L (ref 0–37)
Albumin: 2.3 g/dL — ABNORMAL LOW (ref 3.5–5.2)
Alkaline Phosphatase: 99 U/L (ref 39–117)
Anion gap: 15 (ref 5–15)
BUN: 21 mg/dL (ref 6–23)
CALCIUM: 9.1 mg/dL (ref 8.4–10.5)
CO2: 29 mEq/L (ref 19–32)
CREATININE: 4.48 mg/dL — AB (ref 0.50–1.35)
Chloride: 96 mEq/L (ref 96–112)
GFR calc Af Amer: 16 mL/min — ABNORMAL LOW (ref >90)
GFR calc non Af Amer: 14 mL/min — ABNORMAL LOW (ref >90)
Glucose, Bld: 87 mg/dL (ref 70–99)
Potassium: 3.8 mEq/L (ref 3.7–5.3)
Sodium: 140 mEq/L (ref 137–147)
Total Bilirubin: 0.4 mg/dL (ref 0.3–1.2)
Total Protein: 6.1 g/dL (ref 6.0–8.3)

## 2014-08-20 LAB — FOLATE: Folate: 5.3 ng/mL

## 2014-08-20 LAB — IRON AND TIBC
Iron: 15 ug/dL — ABNORMAL LOW (ref 42–135)
Saturation Ratios: 10 % — ABNORMAL LOW (ref 20–55)
TIBC: 143 ug/dL — ABNORMAL LOW (ref 215–435)
UIBC: 128 ug/dL (ref 125–400)

## 2014-08-20 LAB — RETICULOCYTES
RBC.: 1.77 MIL/uL — AB (ref 4.22–5.81)
Retic Count, Absolute: 104.4 10*3/uL (ref 19.0–186.0)
Retic Ct Pct: 5.9 % — ABNORMAL HIGH (ref 0.4–3.1)

## 2014-08-20 LAB — LACTIC ACID, PLASMA: LACTIC ACID, VENOUS: 0.9 mmol/L (ref 0.5–2.2)

## 2014-08-20 LAB — FERRITIN: Ferritin: 1396 ng/mL — ABNORMAL HIGH (ref 22–322)

## 2014-08-20 LAB — PREPARE RBC (CROSSMATCH)

## 2014-08-20 LAB — ABO/RH: ABO/RH(D): A POS

## 2014-08-20 LAB — VITAMIN B12: VITAMIN B 12: 603 pg/mL (ref 211–911)

## 2014-08-20 LAB — PROTIME-INR
INR: 3.25 — ABNORMAL HIGH (ref 0.00–1.49)
PROTHROMBIN TIME: 33.4 s — AB (ref 11.6–15.2)

## 2014-08-20 MED ORDER — SODIUM CHLORIDE 0.9 % IV SOLN
1000.0000 mL | Freq: Once | INTRAVENOUS | Status: AC
  Administered 2014-08-20: 1000 mL via INTRAVENOUS

## 2014-08-20 MED ORDER — SODIUM CHLORIDE 0.9 % IV BOLUS (SEPSIS)
500.0000 mL | Freq: Once | INTRAVENOUS | Status: AC
  Administered 2014-08-20: 500 mL via INTRAVENOUS

## 2014-08-20 MED ORDER — SODIUM CHLORIDE 0.9 % IV SOLN: 10.0000 mL/h | Freq: Once | INTRAVENOUS | Status: DC

## 2014-08-20 MED ORDER — MORPHINE SULFATE 4 MG/ML IJ SOLN
4.0000 mg | Freq: Once | INTRAMUSCULAR | Status: AC
  Administered 2014-08-20: 4 mg via INTRAVENOUS

## 2014-08-20 NOTE — ED Notes (Signed)
Paged [facility]ALS line for a transfer to [facility] bed.

## 2014-08-20 NOTE — ED Notes (Signed)
Report to [place] at [facility]. ETA 20 minutes

## 2014-08-20 NOTE — Consult Note (Signed)
PCP:   No primary care provider on file.   Chief Complaint:  fever  HPI: 53 yo male h/o ESRD dialysis t/r/sat (was born with one kidney only then about 8 yrs ago that kidney failed due to stones and had to be removed), htn, s/p AVR, with recent hospitalization at [facility] requiring bilateral [facility]ka in the past week at [facility]  He was intially at [facility] for 3 days with black colored toes on both feet, transferred to [facility] from [facility] on Sunday October 25.  Vascular studies were done on his legs, and they could not save both of his legs, there was no stenting done peripherally that the patient knows of at [facility].  He had one amputated on oct 21 wed, then the other amputated on oct 24 saturday.  His postoperative course was complicated by worsening anemia requiring 3 units of prbc transfused.  He is not aware of any other complications.  Denies having any infections while there.  He was d/c to snf.  His wounds have been healing well.  But then yesterday starting running fever over 101.  He denies any cough, no sob.  No chest pain.  Does not make urine.  He denies any rashes, but he along with his wife and son who are present think that his stumps look more red than it has been looking.  He still has staples in place and is to follow up with his surgeon at [facility] second week of November.  He knows his hemoglobin was around 10 when they discharged him from the snf.  Denies melena or brbpr.  No n/v.  No abd pain.  He had dialysis yesterday.  His bp is not normally low, and he has required more blood transfusions than normal in the last 6 months.  i am asked to obs patient overnight for a blood transfusion.  Family and patient are requesting to be transferred to [facility] where his surgeons are.  All above history is from patient and family, do not have any [facility] records to review.  Review of Systems:  Positive and negative as per HPI otherwise all other systems are negative  Past Medical History: Past  Medical History  Diagnosis Date  . ESRD (end stage renal disease)     a. Dx ~ 2008, HD x 2 yrs then PD since.  . Hypertension   . Anxiety   . Chest pain     a. 2011 cath: Normal coronary arteries;  b. 04/2013 Cath: nonobs dzs - [facility]  . Hyperlipidemia   . TIA (transient ischemic attack)   . Tobacco abuse     a. ongoing (10/2013)  . Adrenal insufficiency   . Venous thrombosis   . Chronic pain syndrome   . Staghorn calculus     a. s/p bilateral nephrectomy  . Aortic insufficiency     a. 2011 s/p bioprosthetic AVR @ [facility] with subsequent failure and redo AVR with mechanical valve (SJM 39mm) 01/2012->chronic coumadin;  b. 04/2013 TEE and Echo ([facility]): No LA clot, nl fxning AVR, EF 35-40%, glob HK, mod dil LA, mild AI, mod TR, RV syst dysfxn.  . Depression    Past Surgical History  Procedure Laterality Date  . Nephrectomy      left secondary to staghorn calculus and chronic pyelonephritis    Medications: Prior to Admission medications   Medication Sig Start Date End Date Taking? Authorizing Provider  acetaminophen (TYLENOL) 500 MG tablet Take 500 mg by mouth every 6 (six) hours.  Yes Historical Provider, MD  amitriptyline (ELAVIL) 25 MG tablet Take 25 mg by mouth at bedtime.   Yes Historical Provider, MD  calcitRIOL (ROCALTROL) 0.5 MCG capsule Take 0.5 mcg by mouth daily.   Yes Historical Provider, MD  calcium carbonate (TUMS - DOSED IN MG ELEMENTAL CALCIUM) 500 MG chewable tablet Chew 1 tablet by mouth 3 (three) times daily.   Yes Historical Provider, MD  docusate sodium (COLACE) 100 MG capsule Take 100 mg by mouth 2 (two) times daily.   Yes Historical Provider, MD  gabapentin (NEURONTIN) 100 MG capsule Take 100 mg by mouth 3 (three) times daily.   Yes Historical Provider, MD  Multiple Vitamin (MULTIVITAMIN) LIQD Take 5 mLs by mouth daily.   Yes Historical Provider, MD  oxyCODONE (OXY IR/ROXICODONE) 5 MG immediate release tablet Take 5 mg by mouth every 4 (four) hours as needed  for severe pain.   Yes Historical Provider, MD  predniSONE (DELTASONE) 10 MG tablet Take 10 mg by mouth daily with breakfast.   Yes Historical Provider, MD  sertraline (ZOLOFT) 50 MG tablet Take 50 mg by mouth daily.   Yes Historical Provider, MD  sevelamer (RENVELA) 800 MG tablet Take 800 mg by mouth 3 (three) times daily with meals.   Yes Historical Provider, MD  albuterol (PROVENTIL HFA;VENTOLIN HFA) 108 (90 BASE) MCG/ACT inhaler Inhale 2 puffs into the lungs every 4 (four) hours as needed for wheezing or shortness of breath.    Historical Provider, MD  aspirin 81 MG tablet Take 81 mg by mouth daily.    Historical Provider, MD  calcium acetate (PHOSLO) 667 MG capsule Take 667 mg by mouth 2 (two) times daily.    Historical Provider, MD  folic acid-vitamin b complex-vitamin c-selenium-zinc (DIALYVITE) 3 MG TABS tablet Take 1 tablet by mouth daily.    Historical Provider, MD  LORazepam (ATIVAN) 1 MG tablet Take 1 mg by mouth 2 (two) times daily.    Historical Provider, MD  nitroGLYCERIN (NITROSTAT) 0.4 MG SL tablet Place 0.4 mg under the tongue every 5 (five) minutes as needed for chest pain.    Historical Provider, MD  Polyethylene Glycol 3350 POWD 1 Package by Does not apply route daily as needed. For constipation    Historical Provider, MD  warfarin (COUMADIN) 7.5 MG tablet Take 7.5 mg by mouth daily.    Historical Provider, MD    Allergies:   Allergies  Allergen Reactions  . Codeine     unknown    Social History:  reports that he has been smoking.  He does not have any smokeless tobacco history on file. He reports that he does not drink alcohol or use illicit drugs.  Family History: Family History  Problem Relation Age of Onset  . Hypertension    . Hyperlipidemia    . Coronary artery disease      Physical Exam: Filed Vitals:   08/20/14 0126 08/20/14 0313 08/20/14 0332 08/20/14 0430  BP: 111/69 93/64 102/64 102/61  Pulse: 127 118 118 114  Temp: 100.1 F (37.8 C)  98.5 F  (36.9 C)   TempSrc: Oral  Oral   Resp: 20 22 20 22   SpO2: 96% 96% 99% 100%   BP 102/61 mmHg  Pulse 114  Temp(Src) 98.5 F (36.9 C) (Oral)  Resp 22  SpO2 100% General appearance: alert, cooperative and no distress Head: Normocephalic, without obvious abnormality, atraumatic Eyes: negative Nose: Nares normal. Septum midline. Mucosa normal. No drainage or sinus tenderness. Neck: no JVD and  supple, symmetrical, trachea midline Lungs: clear to auscultation bilaterally Heart: regular rate and rhythm, S1, S2 normal, no murmur, click, rub or gallop Abdomen: soft, non-tender; bowel sounds normal; no masses,  no organomegaly Extremities: no significant swelling around bilateral stumps.  staples in place, mild erythema around wounds bilaterally concerning for early cellulitis, no purulent drainage, no skin breakdown around wounds, no fluncutance or induration anywhere. Pulses: 2+ and symmetric Skin: nml except as above Neurologic: Grossly normal    Labs on Admission:  Wbc is 15, hgb is 5   Recent Labs  08/20/14 0252  RETICCTPCT 5.9*    Radiological Exams on Admission: No results found.  Assessment/Plan  53 yo male with recent bilateral below the knee amputations due to severe PVD at [facility] with worsening anemia and possible cellulitis around wounds  Principal Problem:   Fever-  im concerned that he is developing early cellulitis around both wounds, right seems to be worse than the left.  Would cover him with antibiotics.  Agree with family with need to transfer to [facility] where his surgeon is and for continuity of care.  cxr no infiltrate, so source is likely his wounds.     Active Problems:  Stable unless o/w noted.   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    Kjirsten Bloodgood A 08/20/2014, 5:19 AM

## 2014-08-20 NOTE — ED Notes (Signed)
Positive occult blood. EDP notified.

## 2014-08-20 NOTE — ED Notes (Signed)
[facility] at bedside 

## 2014-08-20 NOTE — ED Notes (Addendum)
[facility] staff member Silva Bandy notified pt being sent to [facility] at the family request.

## 2014-08-20 NOTE — ED Notes (Signed)
At family request wanting pt to be sent back to [facility]. Dr Shanon Brow to speak to Dr Roxanne Mins.

## 2014-08-20 NOTE — ED Notes (Signed)
Awaiting [facility] at this time for transport to [facility]

## 2014-08-20 NOTE — ED Notes (Signed)
Pt arrived by EMS from the [facility] in [place]. Pt running a fever of 100.8. Pt was given tylenol before sending to the ER. Upon arrival pt complaining of bilateral leg pain & chest pain. Pt had bilateral amputation done 5 days ago at [facility].

## 2014-08-20 NOTE — ED Notes (Signed)
Patient left ED at this time with [facility] staff.

## 2014-08-20 NOTE — ED Provider Notes (Signed)
CSN: [id number]     Arrival date & time 08/20/14  0121 History   None      (Consider location/radiation/quality/duration/timing/severity/associated sxs/prior Treatment) The history is provided by the patient and the nursing home.  53 year old male, five days post-op bilateral below the knee amputations. He states he was not aware of a fever. Is complaining of some pleuritic chest pain but denies chills or sweats. He denies any cough. He is a dialysis patient and is dialyzed Tuesday-Thursday-Saturday. Also, please note that he is anticoagulated on warfarin because of a prosthetic aortic valve. He does not make any urine. He has not noticed any pain or drainage from his surgical site.   Past Medical History  Diagnosis Date  . ESRD (end stage renal disease)     a. Dx ~ 2008, HD x 2 yrs then PD since.  . Hypertension   . Anxiety   . Chest pain     a. 2011 cath: Normal coronary arteries;  b. 04/2013 Cath: nonobs dzs - [facility]  . Hyperlipidemia   . TIA (transient ischemic attack)   . Tobacco abuse     a. ongoing (10/2013)  . Adrenal insufficiency   . Venous thrombosis   . Chronic pain syndrome   . Staghorn calculus     a. s/p bilateral nephrectomy  . Aortic insufficiency     a. 2011 s/p bioprosthetic AVR @ [facility] with subsequent failure and redo AVR with mechanical valve (SJM 78mm) 01/2012->chronic coumadin;  b. 04/2013 TEE and Echo ([facility]): No LA clot, nl fxning AVR, EF 35-40%, glob HK, mod dil LA, mild AI, mod TR, RV syst dysfxn.  . Depression    Past Surgical History  Procedure Laterality Date  . Nephrectomy      left secondary to staghorn calculus and chronic pyelonephritis   Family History  Problem Relation Age of Onset  . Hypertension    . Hyperlipidemia    . Coronary artery disease     History  Substance Use Topics  . Smoking status: Current Every Day Smoker -- 1.00 packs/day for 30 years  . Smokeless tobacco: Not on file     Comment: currently smoking 1/2 ppd.  .  Alcohol Use: No    Review of Systems  All other systems reviewed and are negative.     Allergies  Codeine  Home Medications   Prior to Admission medications   Medication Sig Start Date End Date Taking? Authorizing Provider  acetaminophen (TYLENOL) 500 MG tablet Take 500 mg by mouth every 6 (six) hours.   Yes Historical Provider, MD  amitriptyline (ELAVIL) 25 MG tablet Take 25 mg by mouth at bedtime.   Yes Historical Provider, MD  calcitRIOL (ROCALTROL) 0.5 MCG capsule Take 0.5 mcg by mouth daily.   Yes Historical Provider, MD  calcium carbonate (TUMS - DOSED IN MG ELEMENTAL CALCIUM) 500 MG chewable tablet Chew 1 tablet by mouth 3 (three) times daily.   Yes Historical Provider, MD  docusate sodium (COLACE) 100 MG capsule Take 100 mg by mouth 2 (two) times daily.   Yes Historical Provider, MD  gabapentin (NEURONTIN) 100 MG capsule Take 100 mg by mouth 3 (three) times daily.   Yes Historical Provider, MD  Multiple Vitamin (MULTIVITAMIN) LIQD Take 5 mLs by mouth daily.   Yes Historical Provider, MD  oxyCODONE (OXY IR/ROXICODONE) 5 MG immediate release tablet Take 5 mg by mouth every 4 (four) hours as needed for severe pain.   Yes Historical Provider, MD  predniSONE (  DELTASONE) 10 MG tablet Take 10 mg by mouth daily with breakfast.   Yes Historical Provider, MD  sertraline (ZOLOFT) 50 MG tablet Take 50 mg by mouth daily.   Yes Historical Provider, MD  sevelamer (RENVELA) 800 MG tablet Take 800 mg by mouth 3 (three) times daily with meals.   Yes Historical Provider, MD  albuterol (PROVENTIL HFA;VENTOLIN HFA) 108 (90 BASE) MCG/ACT inhaler Inhale 2 puffs into the lungs every 4 (four) hours as needed for wheezing or shortness of breath.    Historical Provider, MD  aspirin 81 MG tablet Take 81 mg by mouth daily.    Historical Provider, MD  calcium acetate (PHOSLO) 667 MG capsule Take 667 mg by mouth 2 (two) times daily.    Historical Provider, MD  folic acid-vitamin b complex-vitamin  c-selenium-zinc (DIALYVITE) 3 MG TABS tablet Take 1 tablet by mouth daily.    Historical Provider, MD  LORazepam (ATIVAN) 1 MG tablet Take 1 mg by mouth 2 (two) times daily.    Historical Provider, MD  nitroGLYCERIN (NITROSTAT) 0.4 MG SL tablet Place 0.4 mg under the tongue every 5 (five) minutes as needed for chest pain.    Historical Provider, MD  Polyethylene Glycol 3350 POWD 1 Package by Does not apply route daily as needed. For constipation    Historical Provider, MD  warfarin (COUMADIN) 7.5 MG tablet Take 7.5 mg by mouth daily.    Historical Provider, MD   BP 80/49 mmHg  Pulse 101  Temp(Src) 98.7 F (37.1 C) (Oral)  Resp 17  Ht 5\' 11"  (1.803 m)  Wt 202 lb (91.627 kg)  BMI 28.19 kg/m2  SpO2 100% Physical Exam  Nursing note and vitals reviewed.  53 year old male, resting comfortably and in no acute distress. Vital signs are significant for tachycardia. He does not technically have a fever in the ED but temperature is borderline at 100.2.. Oxygen saturation is 94%, which is normal. Head is normocephalic and atraumatic. PERRLA, EOMI. Oropharynx is clear. Neck is nontender and supple without adenopathy or JVD. Back is nontender and there is no CVA tenderness. Lungs are clear without rales, wheezes, or rhonchi. Chest is nontender. Heart has regular rate and rhythm withprominent aortic click and 2/6 systolic ejection murmur consistent with aortic stenosis. Rate is tachycardic Abdomen is soft, flat, nontender without masses or hepatosplenomegaly and peristalsis is normoactive. Extremities:bilateral below-the-knee amputations with staples in place. There is mild erythema of the stumps but no drainage or warmth. AV fistula is present in the left forearm with thrill present. Skin is warm and dry without rash. Neurologic: Mental status is normal, cranial nerves are intact, there are no motor or sensory deficits.  ED Course  Procedures (including critical care time) Labs Review Results for  orders placed or performed during the hospital encounter of 08/20/14  Reticulocytes  Result Value Ref Range   Retic Ct Pct 5.9 (H) 0.4 - 3.1 %   RBC. 1.77 (L) 4.22 - 5.81 MIL/uL   Retic Count, Manual 104.4 19.0 - 186.0 K/uL  CBC with Differential  Result Value Ref Range   WBC 15.7 (H) 4.0 - 10.5 K/uL   RBC 1.75 (L) 4.22 - 5.81 MIL/uL   Hemoglobin 5.1 (LL) 13.0 - 17.0 g/dL   HCT 16.4 (L) 39.0 - 52.0 %   MCV 93.7 78.0 - 100.0 fL   MCH 29.1 26.0 - 34.0 pg   MCHC 31.1 30.0 - 36.0 g/dL   RDW 20.5 (H) 11.5 - 15.5 %   Platelets  232 150 - 400 K/uL   Neutrophils Relative % 78 (H) 43 - 77 %   Lymphocytes Relative 8 (L) 12 - 46 %   Monocytes Relative 11 3 - 12 %   Eosinophils Relative 2 0 - 5 %   Basophils Relative 1 0 - 1 %   Neutro Abs 12.2 (H) 1.7 - 7.7 K/uL   Lymphs Abs 1.3 0.7 - 4.0 K/uL   Monocytes Absolute 1.7 (H) 0.1 - 1.0 K/uL   Eosinophils Absolute 0.3 0.0 - 0.7 K/uL   Basophils Absolute 0.2 (H) 0.0 - 0.1 K/uL   RBC Morphology ELLIPTOCYTES    WBC Morphology TOXIC GRANULATION   Comprehensive metabolic panel  Result Value Ref Range   Sodium 140 137 - 147 mEq/L   Potassium 3.8 3.7 - 5.3 mEq/L   Chloride 96 96 - 112 mEq/L   CO2 29 19 - 32 mEq/L   Glucose, Bld 87 70 - 99 mg/dL   BUN 21 6 - 23 mg/dL   Creatinine, Ser 4.48 (H) 0.50 - 1.35 mg/dL   Calcium 9.1 8.4 - 10.5 mg/dL   Total Protein 6.1 6.0 - 8.3 g/dL   Albumin 2.3 (L) 3.5 - 5.2 g/dL   AST 24 0 - 37 U/L   ALT 5 0 - 53 U/L   Alkaline Phosphatase 99 39 - 117 U/L   Total Bilirubin 0.4 0.3 - 1.2 mg/dL   GFR calc non Af Amer 14 (L) >90 mL/min   GFR calc Af Amer 16 (L) >90 mL/min   Anion gap 15 5 - 15  Lactic acid, plasma  Result Value Ref Range   Lactic Acid, Venous 0.9 0.5 - 2.2 mmol/L  Protime-INR  Result Value Ref Range   Prothrombin Time 33.4 (H) 11.6 - 15.2 seconds   INR 3.25 (H) 0.00 - 1.49  Type and screen for Red Blood Exchange  Result Value Ref Range   ABO/RH(D) A POS    Antibody Screen NEG    Sample  Expiration 08/23/2014    Unit Number [id number]    Blood Component Type RED CELLS,LR    Unit division 00    Status of Unit ISSUED    Transfusion Status OK TO TRANSFUSE    Crossmatch Result Compatible    Unit Number [id number]    Blood Component Type RED CELLS,LR    Unit division 00    Status of Unit ALLOCATED    Transfusion Status OK TO TRANSFUSE    Crossmatch Result Compatible   Prepare RBC (crossmatch)  Result Value Ref Range   Order Confirmation ORDER PROCESSED BY BLOOD BANK   ABO/Rh  Result Value Ref Range   ABO/RH(D) A POS   Prepare RBC  Result Value Ref Range   Order Confirmation PENDING    Imaging Review Dg Chest Port 1 View  08/20/2014   CLINICAL DATA:  No clinical history provided  EXAM: Chest x-ray one view  COMPARISON:  None available  FINDINGS: The heart size appears enlarged. There are bilateral pleural effusions and moderate interstitial edema. No airspace consolidation identified. Atelectasis is noted within the right midlung.  IMPRESSION: Moderate CHF   Electronically Signed   By: Kerby Moors M.D.   On: 08/20/2014 02:19    EKG Interpretation   Date/Time:  Sunday August 20 2014 01:36:50 EDT Ventricular Rate:  125 PR Interval:  101 QRS Duration: 136 QT Interval:  424 QTC Calculation: 611 R Axis:   99 Text Interpretation:  Sinus tachycardia Nonspecific intraventricular  conduction  delay Non-specific ST-t changes Rightward axis Low voltage QRS  When compared with ECG of 10/28/2013, Rightward axis is now Present Low  voltage QRS is now Present HEART RATE has increased Confirmed by [facility]   MD, Dontrell Stuck ([id number]) on 08/20/2014 8:08:27 AM      CRITICAL CARE Performed by: [id number] Total critical care time: 90 minutes Critical care time was exclusive of separately billable procedures and treating other patients. Critical care was necessary to treat or prevent imminent or life-threatening deterioration. Critical care was time spent personally by me on the  following activities: development of treatment plan with patient and/or surrogate as well as nursing, discussions with consultants, evaluation of patient's response to treatment, examination of patient, obtaining history from patient or surrogate, ordering and performing treatments and interventions, ordering and review of laboratory studies, ordering and review of radiographic studies, pulse oximetry and re-evaluation of patient's condition.  MDM   Final diagnoses:  Anemia due to blood loss, acute  Guaiac positive stools  Anticoagulated on warfarin  End stage renal disease on dialysis  Other specified fever    Low-grade fever and tachycardia worrisome for possible sepsis. No clear source. He does not make urine and surgical site does not appear grossly infected. Chest x-ray is obtained which does seem consistent with possible congestive heart failure but no pneumonia. The cause of heart rate and borderline blood pressure, he is given a gentle IV fluid bolus. He will not be dialyzed for another 2 days and does not make urine sediment did not wish to fluid overload him. Lactic acid level has come back normal but hemoglobin has come back low. While in the ED, he did have a dark stool which was Hemoccult positive. INR is therapeutic and arrange appropriate for prosthetic aortic valve. Case was discussed with Dr. Shanon Brow of [facility] to arrange admission to the hospital here but when she came to see him, he stated that he wished to be admitted to [facility] where he had his surgery. I spoke with Dr. Alphonsa Gin, hospitalist at [facility] who agreed to accept the patient but requested that he start with blood transfusion here prior to transfer. He did have one brief episode in the ED were blood pressure dropped into the 80s and responded to another gentle fluid bolus.anticoagulation is not reversed because of concerns regarding his prosthetic valve and blood loss does not appear to be  especially brisk. He will be admitted to a stepdown unit at [place], [place] [id number]

## 2014-08-21 LAB — TYPE AND SCREEN
ABO/RH(D): A POS
ANTIBODY SCREEN: NEGATIVE
UNIT DIVISION: 0
Unit division: 0

## 2014-08-21 LAB — POC OCCULT BLOOD, ED: Fecal Occult Bld: POSITIVE — AB

## 2014-08-28 LAB — CULTURE, BLOOD (ROUTINE X 2)
Culture: NO GROWTH
Culture: NO GROWTH

## 2014-09-23 DIAGNOSIS — N186 End stage renal disease: Secondary | ICD-10-CM | POA: Diagnosis not present

## 2014-09-23 DIAGNOSIS — Z992 Dependence on renal dialysis: Secondary | ICD-10-CM | POA: Diagnosis not present

## 2014-09-23 DIAGNOSIS — J690 Pneumonitis due to inhalation of food and vomit: Secondary | ICD-10-CM | POA: Diagnosis not present

## 2014-09-23 DIAGNOSIS — D509 Iron deficiency anemia, unspecified: Secondary | ICD-10-CM | POA: Diagnosis not present

## 2014-09-25 DIAGNOSIS — R05 Cough: Secondary | ICD-10-CM | POA: Diagnosis not present

## 2014-09-25 DIAGNOSIS — N186 End stage renal disease: Secondary | ICD-10-CM | POA: Diagnosis not present

## 2014-09-25 DIAGNOSIS — G546 Phantom limb syndrome with pain: Secondary | ICD-10-CM | POA: Diagnosis not present

## 2014-09-25 DIAGNOSIS — I120 Hypertensive chronic kidney disease with stage 5 chronic kidney disease or end stage renal disease: Secondary | ICD-10-CM | POA: Diagnosis not present

## 2014-09-25 DIAGNOSIS — Z4781 Encounter for orthopedic aftercare following surgical amputation: Secondary | ICD-10-CM | POA: Diagnosis not present

## 2014-09-25 DIAGNOSIS — D649 Anemia, unspecified: Secondary | ICD-10-CM | POA: Diagnosis not present

## 2014-09-25 DIAGNOSIS — G8929 Other chronic pain: Secondary | ICD-10-CM | POA: Diagnosis not present

## 2014-09-25 DIAGNOSIS — J449 Chronic obstructive pulmonary disease, unspecified: Secondary | ICD-10-CM | POA: Diagnosis not present

## 2014-09-26 DIAGNOSIS — D509 Iron deficiency anemia, unspecified: Secondary | ICD-10-CM | POA: Diagnosis not present

## 2014-09-26 DIAGNOSIS — Z992 Dependence on renal dialysis: Secondary | ICD-10-CM | POA: Diagnosis not present

## 2014-09-26 DIAGNOSIS — N186 End stage renal disease: Secondary | ICD-10-CM | POA: Diagnosis not present

## 2014-09-26 DIAGNOSIS — J690 Pneumonitis due to inhalation of food and vomit: Secondary | ICD-10-CM | POA: Diagnosis not present

## 2014-09-27 DIAGNOSIS — I120 Hypertensive chronic kidney disease with stage 5 chronic kidney disease or end stage renal disease: Secondary | ICD-10-CM | POA: Diagnosis not present

## 2014-09-27 DIAGNOSIS — Z4781 Encounter for orthopedic aftercare following surgical amputation: Secondary | ICD-10-CM | POA: Diagnosis not present

## 2014-09-27 DIAGNOSIS — J449 Chronic obstructive pulmonary disease, unspecified: Secondary | ICD-10-CM | POA: Diagnosis not present

## 2014-09-27 DIAGNOSIS — N186 End stage renal disease: Secondary | ICD-10-CM | POA: Diagnosis not present

## 2014-09-27 DIAGNOSIS — G546 Phantom limb syndrome with pain: Secondary | ICD-10-CM | POA: Diagnosis not present

## 2014-09-27 DIAGNOSIS — G8929 Other chronic pain: Secondary | ICD-10-CM | POA: Diagnosis not present

## 2014-09-28 DIAGNOSIS — Z992 Dependence on renal dialysis: Secondary | ICD-10-CM | POA: Diagnosis not present

## 2014-09-28 DIAGNOSIS — D509 Iron deficiency anemia, unspecified: Secondary | ICD-10-CM | POA: Diagnosis not present

## 2014-09-28 DIAGNOSIS — N186 End stage renal disease: Secondary | ICD-10-CM | POA: Diagnosis not present

## 2014-09-28 DIAGNOSIS — J690 Pneumonitis due to inhalation of food and vomit: Secondary | ICD-10-CM | POA: Diagnosis not present

## 2014-09-29 DIAGNOSIS — J449 Chronic obstructive pulmonary disease, unspecified: Secondary | ICD-10-CM | POA: Diagnosis not present

## 2014-09-29 DIAGNOSIS — Z4781 Encounter for orthopedic aftercare following surgical amputation: Secondary | ICD-10-CM | POA: Diagnosis not present

## 2014-09-29 DIAGNOSIS — G8929 Other chronic pain: Secondary | ICD-10-CM | POA: Diagnosis not present

## 2014-09-29 DIAGNOSIS — I120 Hypertensive chronic kidney disease with stage 5 chronic kidney disease or end stage renal disease: Secondary | ICD-10-CM | POA: Diagnosis not present

## 2014-09-29 DIAGNOSIS — N186 End stage renal disease: Secondary | ICD-10-CM | POA: Diagnosis not present

## 2014-09-29 DIAGNOSIS — G546 Phantom limb syndrome with pain: Secondary | ICD-10-CM | POA: Diagnosis not present

## 2014-09-30 DIAGNOSIS — D509 Iron deficiency anemia, unspecified: Secondary | ICD-10-CM | POA: Diagnosis not present

## 2014-09-30 DIAGNOSIS — J690 Pneumonitis due to inhalation of food and vomit: Secondary | ICD-10-CM | POA: Diagnosis not present

## 2014-09-30 DIAGNOSIS — Z992 Dependence on renal dialysis: Secondary | ICD-10-CM | POA: Diagnosis not present

## 2014-09-30 DIAGNOSIS — N186 End stage renal disease: Secondary | ICD-10-CM | POA: Diagnosis not present

## 2014-10-02 DIAGNOSIS — I10 Essential (primary) hypertension: Secondary | ICD-10-CM | POA: Diagnosis not present

## 2014-10-02 DIAGNOSIS — L03115 Cellulitis of right lower limb: Secondary | ICD-10-CM | POA: Diagnosis not present

## 2014-10-03 DIAGNOSIS — I10 Essential (primary) hypertension: Secondary | ICD-10-CM | POA: Diagnosis not present

## 2014-10-03 DIAGNOSIS — J90 Pleural effusion, not elsewhere classified: Secondary | ICD-10-CM | POA: Diagnosis not present

## 2014-10-03 DIAGNOSIS — F329 Major depressive disorder, single episode, unspecified: Secondary | ICD-10-CM | POA: Diagnosis present

## 2014-10-03 DIAGNOSIS — Z952 Presence of prosthetic heart valve: Secondary | ICD-10-CM | POA: Diagnosis not present

## 2014-10-03 DIAGNOSIS — Z7901 Long term (current) use of anticoagulants: Secondary | ICD-10-CM | POA: Diagnosis not present

## 2014-10-03 DIAGNOSIS — A419 Sepsis, unspecified organism: Secondary | ICD-10-CM | POA: Diagnosis not present

## 2014-10-03 DIAGNOSIS — G8929 Other chronic pain: Secondary | ICD-10-CM | POA: Diagnosis present

## 2014-10-03 DIAGNOSIS — N186 End stage renal disease: Secondary | ICD-10-CM | POA: Diagnosis present

## 2014-10-03 DIAGNOSIS — J449 Chronic obstructive pulmonary disease, unspecified: Secondary | ICD-10-CM | POA: Diagnosis present

## 2014-10-03 DIAGNOSIS — D631 Anemia in chronic kidney disease: Secondary | ICD-10-CM | POA: Diagnosis not present

## 2014-10-03 DIAGNOSIS — K820 Obstruction of gallbladder: Secondary | ICD-10-CM | POA: Diagnosis not present

## 2014-10-03 DIAGNOSIS — R069 Unspecified abnormalities of breathing: Secondary | ICD-10-CM | POA: Diagnosis not present

## 2014-10-03 DIAGNOSIS — Z885 Allergy status to narcotic agent status: Secondary | ICD-10-CM | POA: Diagnosis not present

## 2014-10-03 DIAGNOSIS — N189 Chronic kidney disease, unspecified: Secondary | ICD-10-CM | POA: Diagnosis not present

## 2014-10-03 DIAGNOSIS — R0602 Shortness of breath: Secondary | ICD-10-CM | POA: Diagnosis not present

## 2014-10-03 DIAGNOSIS — I739 Peripheral vascular disease, unspecified: Secondary | ICD-10-CM | POA: Diagnosis present

## 2014-10-03 DIAGNOSIS — Z89511 Acquired absence of right leg below knee: Secondary | ICD-10-CM | POA: Diagnosis not present

## 2014-10-03 DIAGNOSIS — Z89512 Acquired absence of left leg below knee: Secondary | ICD-10-CM | POA: Diagnosis not present

## 2014-10-03 DIAGNOSIS — I5041 Acute combined systolic (congestive) and diastolic (congestive) heart failure: Secondary | ICD-10-CM | POA: Diagnosis not present

## 2014-10-03 DIAGNOSIS — I120 Hypertensive chronic kidney disease with stage 5 chronic kidney disease or end stage renal disease: Secondary | ICD-10-CM | POA: Diagnosis not present

## 2014-10-03 DIAGNOSIS — Z79899 Other long term (current) drug therapy: Secondary | ICD-10-CM | POA: Diagnosis not present

## 2014-10-03 DIAGNOSIS — I447 Left bundle-branch block, unspecified: Secondary | ICD-10-CM | POA: Diagnosis not present

## 2014-10-03 DIAGNOSIS — E039 Hypothyroidism, unspecified: Secondary | ICD-10-CM | POA: Diagnosis present

## 2014-10-03 DIAGNOSIS — I2510 Atherosclerotic heart disease of native coronary artery without angina pectoris: Secondary | ICD-10-CM | POA: Diagnosis present

## 2014-10-03 DIAGNOSIS — D649 Anemia, unspecified: Secondary | ICD-10-CM | POA: Diagnosis not present

## 2014-10-03 DIAGNOSIS — J189 Pneumonia, unspecified organism: Secondary | ICD-10-CM | POA: Diagnosis not present

## 2014-10-03 DIAGNOSIS — I509 Heart failure, unspecified: Secondary | ICD-10-CM | POA: Diagnosis not present

## 2014-10-03 DIAGNOSIS — I517 Cardiomegaly: Secondary | ICD-10-CM | POA: Diagnosis not present

## 2014-10-03 DIAGNOSIS — Z992 Dependence on renal dialysis: Secondary | ICD-10-CM | POA: Diagnosis not present

## 2014-10-03 DIAGNOSIS — Z7952 Long term (current) use of systemic steroids: Secondary | ICD-10-CM | POA: Diagnosis not present

## 2014-10-03 DIAGNOSIS — T827XXA Infection and inflammatory reaction due to other cardiac and vascular devices, implants and grafts, initial encounter: Secondary | ICD-10-CM | POA: Diagnosis not present

## 2014-10-03 DIAGNOSIS — Z954 Presence of other heart-valve replacement: Secondary | ICD-10-CM | POA: Diagnosis not present

## 2014-10-03 DIAGNOSIS — M898X9 Other specified disorders of bone, unspecified site: Secondary | ICD-10-CM | POA: Diagnosis present

## 2014-10-03 DIAGNOSIS — E271 Primary adrenocortical insufficiency: Secondary | ICD-10-CM | POA: Diagnosis not present

## 2014-10-03 DIAGNOSIS — M545 Low back pain: Secondary | ICD-10-CM | POA: Diagnosis present

## 2014-10-03 DIAGNOSIS — F17200 Nicotine dependence, unspecified, uncomplicated: Secondary | ICD-10-CM | POA: Diagnosis present

## 2014-10-11 DIAGNOSIS — N186 End stage renal disease: Secondary | ICD-10-CM | POA: Diagnosis not present

## 2014-10-11 DIAGNOSIS — J449 Chronic obstructive pulmonary disease, unspecified: Secondary | ICD-10-CM | POA: Diagnosis not present

## 2014-10-11 DIAGNOSIS — G546 Phantom limb syndrome with pain: Secondary | ICD-10-CM | POA: Diagnosis not present

## 2014-10-11 DIAGNOSIS — Z4781 Encounter for orthopedic aftercare following surgical amputation: Secondary | ICD-10-CM | POA: Diagnosis not present

## 2014-10-11 DIAGNOSIS — G8929 Other chronic pain: Secondary | ICD-10-CM | POA: Diagnosis not present

## 2014-10-11 DIAGNOSIS — I120 Hypertensive chronic kidney disease with stage 5 chronic kidney disease or end stage renal disease: Secondary | ICD-10-CM | POA: Diagnosis not present

## 2014-10-12 DIAGNOSIS — J690 Pneumonitis due to inhalation of food and vomit: Secondary | ICD-10-CM | POA: Diagnosis not present

## 2014-10-12 DIAGNOSIS — N186 End stage renal disease: Secondary | ICD-10-CM | POA: Diagnosis not present

## 2014-10-12 DIAGNOSIS — Z992 Dependence on renal dialysis: Secondary | ICD-10-CM | POA: Diagnosis not present

## 2014-10-12 DIAGNOSIS — D509 Iron deficiency anemia, unspecified: Secondary | ICD-10-CM | POA: Diagnosis not present

## 2014-10-13 DIAGNOSIS — G546 Phantom limb syndrome with pain: Secondary | ICD-10-CM | POA: Diagnosis not present

## 2014-10-13 DIAGNOSIS — J449 Chronic obstructive pulmonary disease, unspecified: Secondary | ICD-10-CM | POA: Diagnosis not present

## 2014-10-13 DIAGNOSIS — I120 Hypertensive chronic kidney disease with stage 5 chronic kidney disease or end stage renal disease: Secondary | ICD-10-CM | POA: Diagnosis not present

## 2014-10-13 DIAGNOSIS — Z4781 Encounter for orthopedic aftercare following surgical amputation: Secondary | ICD-10-CM | POA: Diagnosis not present

## 2014-10-13 DIAGNOSIS — N186 End stage renal disease: Secondary | ICD-10-CM | POA: Diagnosis not present

## 2014-10-13 DIAGNOSIS — G8929 Other chronic pain: Secondary | ICD-10-CM | POA: Diagnosis not present

## 2014-10-14 DIAGNOSIS — G8929 Other chronic pain: Secondary | ICD-10-CM | POA: Diagnosis not present

## 2014-10-14 DIAGNOSIS — Z4781 Encounter for orthopedic aftercare following surgical amputation: Secondary | ICD-10-CM | POA: Diagnosis not present

## 2014-10-14 DIAGNOSIS — N186 End stage renal disease: Secondary | ICD-10-CM | POA: Diagnosis not present

## 2014-10-14 DIAGNOSIS — I120 Hypertensive chronic kidney disease with stage 5 chronic kidney disease or end stage renal disease: Secondary | ICD-10-CM | POA: Diagnosis not present

## 2014-10-14 DIAGNOSIS — J449 Chronic obstructive pulmonary disease, unspecified: Secondary | ICD-10-CM | POA: Diagnosis not present

## 2014-10-14 DIAGNOSIS — G546 Phantom limb syndrome with pain: Secondary | ICD-10-CM | POA: Diagnosis not present

## 2014-10-15 DIAGNOSIS — Z992 Dependence on renal dialysis: Secondary | ICD-10-CM | POA: Diagnosis not present

## 2014-10-15 DIAGNOSIS — J690 Pneumonitis due to inhalation of food and vomit: Secondary | ICD-10-CM | POA: Diagnosis not present

## 2014-10-15 DIAGNOSIS — D509 Iron deficiency anemia, unspecified: Secondary | ICD-10-CM | POA: Diagnosis not present

## 2014-10-15 DIAGNOSIS — N186 End stage renal disease: Secondary | ICD-10-CM | POA: Diagnosis not present

## 2014-10-16 DIAGNOSIS — J449 Chronic obstructive pulmonary disease, unspecified: Secondary | ICD-10-CM | POA: Diagnosis not present

## 2014-10-16 DIAGNOSIS — I120 Hypertensive chronic kidney disease with stage 5 chronic kidney disease or end stage renal disease: Secondary | ICD-10-CM | POA: Diagnosis not present

## 2014-10-16 DIAGNOSIS — N186 End stage renal disease: Secondary | ICD-10-CM | POA: Diagnosis not present

## 2014-10-16 DIAGNOSIS — G546 Phantom limb syndrome with pain: Secondary | ICD-10-CM | POA: Diagnosis not present

## 2014-10-16 DIAGNOSIS — G8929 Other chronic pain: Secondary | ICD-10-CM | POA: Diagnosis not present

## 2014-10-16 DIAGNOSIS — Z4781 Encounter for orthopedic aftercare following surgical amputation: Secondary | ICD-10-CM | POA: Diagnosis not present

## 2014-10-17 DIAGNOSIS — Z992 Dependence on renal dialysis: Secondary | ICD-10-CM | POA: Diagnosis not present

## 2014-10-17 DIAGNOSIS — D631 Anemia in chronic kidney disease: Secondary | ICD-10-CM | POA: Diagnosis not present

## 2014-10-17 DIAGNOSIS — N186 End stage renal disease: Secondary | ICD-10-CM | POA: Diagnosis not present

## 2014-10-18 DIAGNOSIS — N186 End stage renal disease: Secondary | ICD-10-CM | POA: Diagnosis not present

## 2014-10-18 DIAGNOSIS — J449 Chronic obstructive pulmonary disease, unspecified: Secondary | ICD-10-CM | POA: Diagnosis not present

## 2014-10-18 DIAGNOSIS — Z4781 Encounter for orthopedic aftercare following surgical amputation: Secondary | ICD-10-CM | POA: Diagnosis not present

## 2014-10-18 DIAGNOSIS — G546 Phantom limb syndrome with pain: Secondary | ICD-10-CM | POA: Diagnosis not present

## 2014-10-18 DIAGNOSIS — I120 Hypertensive chronic kidney disease with stage 5 chronic kidney disease or end stage renal disease: Secondary | ICD-10-CM | POA: Diagnosis not present

## 2014-10-18 DIAGNOSIS — G8929 Other chronic pain: Secondary | ICD-10-CM | POA: Diagnosis not present

## 2014-10-19 DIAGNOSIS — N186 End stage renal disease: Secondary | ICD-10-CM | POA: Diagnosis not present

## 2014-10-19 DIAGNOSIS — Z992 Dependence on renal dialysis: Secondary | ICD-10-CM | POA: Diagnosis not present

## 2014-10-19 DIAGNOSIS — D631 Anemia in chronic kidney disease: Secondary | ICD-10-CM | POA: Diagnosis not present

## 2014-10-20 DIAGNOSIS — J449 Chronic obstructive pulmonary disease, unspecified: Secondary | ICD-10-CM | POA: Diagnosis not present

## 2014-10-20 DIAGNOSIS — G546 Phantom limb syndrome with pain: Secondary | ICD-10-CM | POA: Diagnosis not present

## 2014-10-20 DIAGNOSIS — I120 Hypertensive chronic kidney disease with stage 5 chronic kidney disease or end stage renal disease: Secondary | ICD-10-CM | POA: Diagnosis not present

## 2014-10-20 DIAGNOSIS — Z4781 Encounter for orthopedic aftercare following surgical amputation: Secondary | ICD-10-CM | POA: Diagnosis not present

## 2014-10-20 DIAGNOSIS — N186 End stage renal disease: Secondary | ICD-10-CM | POA: Diagnosis not present

## 2014-10-20 DIAGNOSIS — G8929 Other chronic pain: Secondary | ICD-10-CM | POA: Diagnosis not present

## 2014-10-21 DIAGNOSIS — N186 End stage renal disease: Secondary | ICD-10-CM | POA: Diagnosis not present

## 2014-10-21 DIAGNOSIS — Z992 Dependence on renal dialysis: Secondary | ICD-10-CM | POA: Diagnosis not present

## 2014-10-21 DIAGNOSIS — D631 Anemia in chronic kidney disease: Secondary | ICD-10-CM | POA: Diagnosis not present

## 2014-10-23 DIAGNOSIS — J449 Chronic obstructive pulmonary disease, unspecified: Secondary | ICD-10-CM | POA: Diagnosis not present

## 2014-10-23 DIAGNOSIS — G546 Phantom limb syndrome with pain: Secondary | ICD-10-CM | POA: Diagnosis not present

## 2014-10-23 DIAGNOSIS — G8929 Other chronic pain: Secondary | ICD-10-CM | POA: Diagnosis not present

## 2014-10-23 DIAGNOSIS — Z4781 Encounter for orthopedic aftercare following surgical amputation: Secondary | ICD-10-CM | POA: Diagnosis not present

## 2014-10-23 DIAGNOSIS — N186 End stage renal disease: Secondary | ICD-10-CM | POA: Diagnosis not present

## 2014-10-23 DIAGNOSIS — I120 Hypertensive chronic kidney disease with stage 5 chronic kidney disease or end stage renal disease: Secondary | ICD-10-CM | POA: Diagnosis not present

## 2014-10-24 DIAGNOSIS — N186 End stage renal disease: Secondary | ICD-10-CM | POA: Diagnosis not present

## 2014-10-24 DIAGNOSIS — D631 Anemia in chronic kidney disease: Secondary | ICD-10-CM | POA: Diagnosis not present

## 2014-10-24 DIAGNOSIS — Z992 Dependence on renal dialysis: Secondary | ICD-10-CM | POA: Diagnosis not present

## 2014-10-25 DIAGNOSIS — G546 Phantom limb syndrome with pain: Secondary | ICD-10-CM | POA: Diagnosis not present

## 2014-10-25 DIAGNOSIS — Z4781 Encounter for orthopedic aftercare following surgical amputation: Secondary | ICD-10-CM | POA: Diagnosis not present

## 2014-10-25 DIAGNOSIS — G8929 Other chronic pain: Secondary | ICD-10-CM | POA: Diagnosis not present

## 2014-10-25 DIAGNOSIS — I120 Hypertensive chronic kidney disease with stage 5 chronic kidney disease or end stage renal disease: Secondary | ICD-10-CM | POA: Diagnosis not present

## 2014-10-25 DIAGNOSIS — J449 Chronic obstructive pulmonary disease, unspecified: Secondary | ICD-10-CM | POA: Diagnosis not present

## 2014-10-25 DIAGNOSIS — N186 End stage renal disease: Secondary | ICD-10-CM | POA: Diagnosis not present

## 2014-10-26 DIAGNOSIS — N186 End stage renal disease: Secondary | ICD-10-CM | POA: Diagnosis not present

## 2014-10-26 DIAGNOSIS — D631 Anemia in chronic kidney disease: Secondary | ICD-10-CM | POA: Diagnosis not present

## 2014-10-26 DIAGNOSIS — Z992 Dependence on renal dialysis: Secondary | ICD-10-CM | POA: Diagnosis not present

## 2014-10-27 DIAGNOSIS — G546 Phantom limb syndrome with pain: Secondary | ICD-10-CM | POA: Diagnosis not present

## 2014-10-27 DIAGNOSIS — N186 End stage renal disease: Secondary | ICD-10-CM | POA: Diagnosis not present

## 2014-10-27 DIAGNOSIS — Z4781 Encounter for orthopedic aftercare following surgical amputation: Secondary | ICD-10-CM | POA: Diagnosis not present

## 2014-10-27 DIAGNOSIS — J449 Chronic obstructive pulmonary disease, unspecified: Secondary | ICD-10-CM | POA: Diagnosis not present

## 2014-10-27 DIAGNOSIS — I120 Hypertensive chronic kidney disease with stage 5 chronic kidney disease or end stage renal disease: Secondary | ICD-10-CM | POA: Diagnosis not present

## 2014-10-27 DIAGNOSIS — G8929 Other chronic pain: Secondary | ICD-10-CM | POA: Diagnosis not present

## 2014-10-28 DIAGNOSIS — Z992 Dependence on renal dialysis: Secondary | ICD-10-CM | POA: Diagnosis not present

## 2014-10-28 DIAGNOSIS — R7881 Bacteremia: Secondary | ICD-10-CM | POA: Diagnosis not present

## 2014-10-28 DIAGNOSIS — D631 Anemia in chronic kidney disease: Secondary | ICD-10-CM | POA: Diagnosis not present

## 2014-10-28 DIAGNOSIS — D509 Iron deficiency anemia, unspecified: Secondary | ICD-10-CM | POA: Diagnosis not present

## 2014-10-28 DIAGNOSIS — N2581 Secondary hyperparathyroidism of renal origin: Secondary | ICD-10-CM | POA: Diagnosis not present

## 2014-10-28 DIAGNOSIS — N186 End stage renal disease: Secondary | ICD-10-CM | POA: Diagnosis not present

## 2014-10-30 DIAGNOSIS — G8929 Other chronic pain: Secondary | ICD-10-CM | POA: Diagnosis not present

## 2014-10-30 DIAGNOSIS — Z4781 Encounter for orthopedic aftercare following surgical amputation: Secondary | ICD-10-CM | POA: Diagnosis not present

## 2014-10-30 DIAGNOSIS — J449 Chronic obstructive pulmonary disease, unspecified: Secondary | ICD-10-CM | POA: Diagnosis not present

## 2014-10-30 DIAGNOSIS — G546 Phantom limb syndrome with pain: Secondary | ICD-10-CM | POA: Diagnosis not present

## 2014-10-30 DIAGNOSIS — I120 Hypertensive chronic kidney disease with stage 5 chronic kidney disease or end stage renal disease: Secondary | ICD-10-CM | POA: Diagnosis not present

## 2014-10-30 DIAGNOSIS — N186 End stage renal disease: Secondary | ICD-10-CM | POA: Diagnosis not present

## 2014-10-31 DIAGNOSIS — D631 Anemia in chronic kidney disease: Secondary | ICD-10-CM | POA: Diagnosis not present

## 2014-10-31 DIAGNOSIS — D509 Iron deficiency anemia, unspecified: Secondary | ICD-10-CM | POA: Diagnosis not present

## 2014-10-31 DIAGNOSIS — N186 End stage renal disease: Secondary | ICD-10-CM | POA: Diagnosis not present

## 2014-10-31 DIAGNOSIS — Z992 Dependence on renal dialysis: Secondary | ICD-10-CM | POA: Diagnosis not present

## 2014-10-31 DIAGNOSIS — R7881 Bacteremia: Secondary | ICD-10-CM | POA: Diagnosis not present

## 2014-10-31 DIAGNOSIS — N2581 Secondary hyperparathyroidism of renal origin: Secondary | ICD-10-CM | POA: Diagnosis not present

## 2014-11-01 DIAGNOSIS — I120 Hypertensive chronic kidney disease with stage 5 chronic kidney disease or end stage renal disease: Secondary | ICD-10-CM | POA: Diagnosis not present

## 2014-11-01 DIAGNOSIS — Z4781 Encounter for orthopedic aftercare following surgical amputation: Secondary | ICD-10-CM | POA: Diagnosis not present

## 2014-11-01 DIAGNOSIS — N186 End stage renal disease: Secondary | ICD-10-CM | POA: Diagnosis not present

## 2014-11-01 DIAGNOSIS — J449 Chronic obstructive pulmonary disease, unspecified: Secondary | ICD-10-CM | POA: Diagnosis not present

## 2014-11-01 DIAGNOSIS — G8929 Other chronic pain: Secondary | ICD-10-CM | POA: Diagnosis not present

## 2014-11-01 DIAGNOSIS — G546 Phantom limb syndrome with pain: Secondary | ICD-10-CM | POA: Diagnosis not present

## 2014-11-02 DIAGNOSIS — N186 End stage renal disease: Secondary | ICD-10-CM | POA: Diagnosis not present

## 2014-11-02 DIAGNOSIS — D631 Anemia in chronic kidney disease: Secondary | ICD-10-CM | POA: Diagnosis not present

## 2014-11-02 DIAGNOSIS — N2581 Secondary hyperparathyroidism of renal origin: Secondary | ICD-10-CM | POA: Diagnosis not present

## 2014-11-02 DIAGNOSIS — D509 Iron deficiency anemia, unspecified: Secondary | ICD-10-CM | POA: Diagnosis not present

## 2014-11-02 DIAGNOSIS — Z992 Dependence on renal dialysis: Secondary | ICD-10-CM | POA: Diagnosis not present

## 2014-11-02 DIAGNOSIS — R7881 Bacteremia: Secondary | ICD-10-CM | POA: Diagnosis not present

## 2014-11-03 DIAGNOSIS — I120 Hypertensive chronic kidney disease with stage 5 chronic kidney disease or end stage renal disease: Secondary | ICD-10-CM | POA: Diagnosis not present

## 2014-11-03 DIAGNOSIS — G546 Phantom limb syndrome with pain: Secondary | ICD-10-CM | POA: Diagnosis not present

## 2014-11-03 DIAGNOSIS — Z4781 Encounter for orthopedic aftercare following surgical amputation: Secondary | ICD-10-CM | POA: Diagnosis not present

## 2014-11-03 DIAGNOSIS — N186 End stage renal disease: Secondary | ICD-10-CM | POA: Diagnosis not present

## 2014-11-03 DIAGNOSIS — J449 Chronic obstructive pulmonary disease, unspecified: Secondary | ICD-10-CM | POA: Diagnosis not present

## 2014-11-03 DIAGNOSIS — G8929 Other chronic pain: Secondary | ICD-10-CM | POA: Diagnosis not present

## 2014-11-04 DIAGNOSIS — D509 Iron deficiency anemia, unspecified: Secondary | ICD-10-CM | POA: Diagnosis not present

## 2014-11-04 DIAGNOSIS — D631 Anemia in chronic kidney disease: Secondary | ICD-10-CM | POA: Diagnosis not present

## 2014-11-04 DIAGNOSIS — Z992 Dependence on renal dialysis: Secondary | ICD-10-CM | POA: Diagnosis not present

## 2014-11-04 DIAGNOSIS — N186 End stage renal disease: Secondary | ICD-10-CM | POA: Diagnosis not present

## 2014-11-04 DIAGNOSIS — R7881 Bacteremia: Secondary | ICD-10-CM | POA: Diagnosis not present

## 2014-11-04 DIAGNOSIS — N2581 Secondary hyperparathyroidism of renal origin: Secondary | ICD-10-CM | POA: Diagnosis not present

## 2014-11-06 DIAGNOSIS — N186 End stage renal disease: Secondary | ICD-10-CM | POA: Diagnosis not present

## 2014-11-06 DIAGNOSIS — Z4902 Encounter for fitting and adjustment of peritoneal dialysis catheter: Secondary | ICD-10-CM | POA: Diagnosis not present

## 2014-11-06 DIAGNOSIS — Z992 Dependence on renal dialysis: Secondary | ICD-10-CM | POA: Diagnosis not present

## 2014-11-06 DIAGNOSIS — D631 Anemia in chronic kidney disease: Secondary | ICD-10-CM | POA: Diagnosis not present

## 2014-11-06 DIAGNOSIS — R7881 Bacteremia: Secondary | ICD-10-CM | POA: Diagnosis not present

## 2014-11-06 DIAGNOSIS — D509 Iron deficiency anemia, unspecified: Secondary | ICD-10-CM | POA: Diagnosis not present

## 2014-11-06 DIAGNOSIS — A419 Sepsis, unspecified organism: Secondary | ICD-10-CM | POA: Diagnosis not present

## 2014-11-06 DIAGNOSIS — N2581 Secondary hyperparathyroidism of renal origin: Secondary | ICD-10-CM | POA: Diagnosis not present

## 2014-11-06 DIAGNOSIS — I120 Hypertensive chronic kidney disease with stage 5 chronic kidney disease or end stage renal disease: Secondary | ICD-10-CM | POA: Diagnosis not present

## 2014-11-07 DIAGNOSIS — Z7901 Long term (current) use of anticoagulants: Secondary | ICD-10-CM | POA: Diagnosis not present

## 2014-11-07 DIAGNOSIS — R918 Other nonspecific abnormal finding of lung field: Secondary | ICD-10-CM | POA: Diagnosis not present

## 2014-11-07 DIAGNOSIS — A419 Sepsis, unspecified organism: Secondary | ICD-10-CM | POA: Diagnosis present

## 2014-11-07 DIAGNOSIS — J449 Chronic obstructive pulmonary disease, unspecified: Secondary | ICD-10-CM | POA: Diagnosis present

## 2014-11-07 DIAGNOSIS — I361 Nonrheumatic tricuspid (valve) insufficiency: Secondary | ICD-10-CM | POA: Diagnosis not present

## 2014-11-07 DIAGNOSIS — K668 Other specified disorders of peritoneum: Secondary | ICD-10-CM | POA: Diagnosis not present

## 2014-11-07 DIAGNOSIS — E872 Acidosis: Secondary | ICD-10-CM | POA: Diagnosis present

## 2014-11-07 DIAGNOSIS — Z992 Dependence on renal dialysis: Secondary | ICD-10-CM | POA: Diagnosis not present

## 2014-11-07 DIAGNOSIS — R938 Abnormal findings on diagnostic imaging of other specified body structures: Secondary | ICD-10-CM | POA: Diagnosis not present

## 2014-11-07 DIAGNOSIS — B9689 Other specified bacterial agents as the cause of diseases classified elsewhere: Secondary | ICD-10-CM | POA: Diagnosis not present

## 2014-11-07 DIAGNOSIS — Z87442 Personal history of urinary calculi: Secondary | ICD-10-CM | POA: Diagnosis not present

## 2014-11-07 DIAGNOSIS — I350 Nonrheumatic aortic (valve) stenosis: Secondary | ICD-10-CM | POA: Diagnosis present

## 2014-11-07 DIAGNOSIS — E271 Primary adrenocortical insufficiency: Secondary | ICD-10-CM | POA: Diagnosis present

## 2014-11-07 DIAGNOSIS — E876 Hypokalemia: Secondary | ICD-10-CM | POA: Diagnosis present

## 2014-11-07 DIAGNOSIS — Z89512 Acquired absence of left leg below knee: Secondary | ICD-10-CM | POA: Diagnosis not present

## 2014-11-07 DIAGNOSIS — R34 Anuria and oliguria: Secondary | ICD-10-CM | POA: Diagnosis not present

## 2014-11-07 DIAGNOSIS — I120 Hypertensive chronic kidney disease with stage 5 chronic kidney disease or end stage renal disease: Secondary | ICD-10-CM | POA: Diagnosis present

## 2014-11-07 DIAGNOSIS — F17210 Nicotine dependence, cigarettes, uncomplicated: Secondary | ICD-10-CM | POA: Diagnosis present

## 2014-11-07 DIAGNOSIS — J90 Pleural effusion, not elsewhere classified: Secondary | ICD-10-CM | POA: Diagnosis present

## 2014-11-07 DIAGNOSIS — T8571XA Infection and inflammatory reaction due to peritoneal dialysis catheter, initial encounter: Secondary | ICD-10-CM | POA: Diagnosis not present

## 2014-11-07 DIAGNOSIS — N186 End stage renal disease: Secondary | ICD-10-CM | POA: Diagnosis present

## 2014-11-07 DIAGNOSIS — R000 Tachycardia, unspecified: Secondary | ICD-10-CM | POA: Diagnosis not present

## 2014-11-07 DIAGNOSIS — Z89511 Acquired absence of right leg below knee: Secondary | ICD-10-CM | POA: Diagnosis not present

## 2014-11-07 DIAGNOSIS — Z452 Encounter for adjustment and management of vascular access device: Secondary | ICD-10-CM | POA: Diagnosis not present

## 2014-11-07 DIAGNOSIS — Z905 Acquired absence of kidney: Secondary | ICD-10-CM | POA: Diagnosis present

## 2014-11-07 DIAGNOSIS — I5031 Acute diastolic (congestive) heart failure: Secondary | ICD-10-CM | POA: Diagnosis not present

## 2014-11-07 DIAGNOSIS — K658 Other peritonitis: Secondary | ICD-10-CM | POA: Diagnosis not present

## 2014-11-07 DIAGNOSIS — R509 Fever, unspecified: Secondary | ICD-10-CM | POA: Diagnosis not present

## 2014-11-07 DIAGNOSIS — R109 Unspecified abdominal pain: Secondary | ICD-10-CM | POA: Diagnosis not present

## 2014-11-07 DIAGNOSIS — Z952 Presence of prosthetic heart valve: Secondary | ICD-10-CM | POA: Diagnosis not present

## 2014-11-07 DIAGNOSIS — R6521 Severe sepsis with septic shock: Secondary | ICD-10-CM | POA: Diagnosis not present

## 2014-11-07 DIAGNOSIS — F329 Major depressive disorder, single episode, unspecified: Secondary | ICD-10-CM | POA: Diagnosis present

## 2014-11-07 DIAGNOSIS — J189 Pneumonia, unspecified organism: Secondary | ICD-10-CM | POA: Diagnosis not present

## 2014-11-07 DIAGNOSIS — L89151 Pressure ulcer of sacral region, stage 1: Secondary | ICD-10-CM | POA: Diagnosis present

## 2014-11-07 DIAGNOSIS — N2581 Secondary hyperparathyroidism of renal origin: Secondary | ICD-10-CM | POA: Diagnosis present

## 2014-11-07 DIAGNOSIS — Z4902 Encounter for fitting and adjustment of peritoneal dialysis catheter: Secondary | ICD-10-CM | POA: Diagnosis not present

## 2014-11-17 DIAGNOSIS — G8929 Other chronic pain: Secondary | ICD-10-CM | POA: Diagnosis not present

## 2014-11-17 DIAGNOSIS — G546 Phantom limb syndrome with pain: Secondary | ICD-10-CM | POA: Diagnosis not present

## 2014-11-17 DIAGNOSIS — I120 Hypertensive chronic kidney disease with stage 5 chronic kidney disease or end stage renal disease: Secondary | ICD-10-CM | POA: Diagnosis not present

## 2014-11-17 DIAGNOSIS — Z4781 Encounter for orthopedic aftercare following surgical amputation: Secondary | ICD-10-CM | POA: Diagnosis not present

## 2014-11-17 DIAGNOSIS — N186 End stage renal disease: Secondary | ICD-10-CM | POA: Diagnosis not present

## 2014-11-17 DIAGNOSIS — J449 Chronic obstructive pulmonary disease, unspecified: Secondary | ICD-10-CM | POA: Diagnosis not present

## 2014-11-18 DIAGNOSIS — D631 Anemia in chronic kidney disease: Secondary | ICD-10-CM | POA: Diagnosis not present

## 2014-11-18 DIAGNOSIS — R7881 Bacteremia: Secondary | ICD-10-CM | POA: Diagnosis not present

## 2014-11-18 DIAGNOSIS — Z992 Dependence on renal dialysis: Secondary | ICD-10-CM | POA: Diagnosis not present

## 2014-11-18 DIAGNOSIS — D509 Iron deficiency anemia, unspecified: Secondary | ICD-10-CM | POA: Diagnosis not present

## 2014-11-18 DIAGNOSIS — N2581 Secondary hyperparathyroidism of renal origin: Secondary | ICD-10-CM | POA: Diagnosis not present

## 2014-11-18 DIAGNOSIS — N186 End stage renal disease: Secondary | ICD-10-CM | POA: Diagnosis not present

## 2014-11-19 DIAGNOSIS — Z992 Dependence on renal dialysis: Secondary | ICD-10-CM | POA: Diagnosis not present

## 2014-11-19 DIAGNOSIS — N186 End stage renal disease: Secondary | ICD-10-CM | POA: Diagnosis not present

## 2014-11-20 DIAGNOSIS — G8929 Other chronic pain: Secondary | ICD-10-CM | POA: Diagnosis not present

## 2014-11-20 DIAGNOSIS — J449 Chronic obstructive pulmonary disease, unspecified: Secondary | ICD-10-CM | POA: Diagnosis not present

## 2014-11-20 DIAGNOSIS — I120 Hypertensive chronic kidney disease with stage 5 chronic kidney disease or end stage renal disease: Secondary | ICD-10-CM | POA: Diagnosis not present

## 2014-11-20 DIAGNOSIS — N186 End stage renal disease: Secondary | ICD-10-CM | POA: Diagnosis not present

## 2014-11-20 DIAGNOSIS — G546 Phantom limb syndrome with pain: Secondary | ICD-10-CM | POA: Diagnosis not present

## 2014-11-20 DIAGNOSIS — Z4781 Encounter for orthopedic aftercare following surgical amputation: Secondary | ICD-10-CM | POA: Diagnosis not present

## 2014-11-21 DIAGNOSIS — N186 End stage renal disease: Secondary | ICD-10-CM | POA: Diagnosis not present

## 2014-11-21 DIAGNOSIS — Z992 Dependence on renal dialysis: Secondary | ICD-10-CM | POA: Diagnosis not present

## 2014-11-21 DIAGNOSIS — D509 Iron deficiency anemia, unspecified: Secondary | ICD-10-CM | POA: Diagnosis not present

## 2014-11-21 DIAGNOSIS — N2581 Secondary hyperparathyroidism of renal origin: Secondary | ICD-10-CM | POA: Diagnosis not present

## 2014-11-21 DIAGNOSIS — A419 Sepsis, unspecified organism: Secondary | ICD-10-CM | POA: Diagnosis not present

## 2014-11-21 DIAGNOSIS — D631 Anemia in chronic kidney disease: Secondary | ICD-10-CM | POA: Diagnosis not present

## 2014-11-22 DIAGNOSIS — N186 End stage renal disease: Secondary | ICD-10-CM | POA: Diagnosis not present

## 2014-11-22 DIAGNOSIS — Z4781 Encounter for orthopedic aftercare following surgical amputation: Secondary | ICD-10-CM | POA: Diagnosis not present

## 2014-11-22 DIAGNOSIS — G546 Phantom limb syndrome with pain: Secondary | ICD-10-CM | POA: Diagnosis not present

## 2014-11-22 DIAGNOSIS — I120 Hypertensive chronic kidney disease with stage 5 chronic kidney disease or end stage renal disease: Secondary | ICD-10-CM | POA: Diagnosis not present

## 2014-11-22 DIAGNOSIS — J449 Chronic obstructive pulmonary disease, unspecified: Secondary | ICD-10-CM | POA: Diagnosis not present

## 2014-11-22 DIAGNOSIS — G8929 Other chronic pain: Secondary | ICD-10-CM | POA: Diagnosis not present

## 2014-11-23 DIAGNOSIS — D631 Anemia in chronic kidney disease: Secondary | ICD-10-CM | POA: Diagnosis not present

## 2014-11-23 DIAGNOSIS — Z992 Dependence on renal dialysis: Secondary | ICD-10-CM | POA: Diagnosis not present

## 2014-11-23 DIAGNOSIS — N2581 Secondary hyperparathyroidism of renal origin: Secondary | ICD-10-CM | POA: Diagnosis not present

## 2014-11-23 DIAGNOSIS — N186 End stage renal disease: Secondary | ICD-10-CM | POA: Diagnosis not present

## 2014-11-23 DIAGNOSIS — D509 Iron deficiency anemia, unspecified: Secondary | ICD-10-CM | POA: Diagnosis not present

## 2014-11-23 DIAGNOSIS — A419 Sepsis, unspecified organism: Secondary | ICD-10-CM | POA: Diagnosis not present

## 2014-11-24 DIAGNOSIS — I120 Hypertensive chronic kidney disease with stage 5 chronic kidney disease or end stage renal disease: Secondary | ICD-10-CM | POA: Diagnosis not present

## 2014-11-24 DIAGNOSIS — Z4781 Encounter for orthopedic aftercare following surgical amputation: Secondary | ICD-10-CM | POA: Diagnosis not present

## 2014-11-24 DIAGNOSIS — N186 End stage renal disease: Secondary | ICD-10-CM | POA: Diagnosis not present

## 2014-11-24 DIAGNOSIS — J449 Chronic obstructive pulmonary disease, unspecified: Secondary | ICD-10-CM | POA: Diagnosis not present

## 2014-11-24 DIAGNOSIS — G546 Phantom limb syndrome with pain: Secondary | ICD-10-CM | POA: Diagnosis not present

## 2014-11-24 DIAGNOSIS — G8929 Other chronic pain: Secondary | ICD-10-CM | POA: Diagnosis not present

## 2014-11-25 DIAGNOSIS — Z992 Dependence on renal dialysis: Secondary | ICD-10-CM | POA: Diagnosis not present

## 2014-11-25 DIAGNOSIS — A419 Sepsis, unspecified organism: Secondary | ICD-10-CM | POA: Diagnosis not present

## 2014-11-25 DIAGNOSIS — D631 Anemia in chronic kidney disease: Secondary | ICD-10-CM | POA: Diagnosis not present

## 2014-11-25 DIAGNOSIS — D509 Iron deficiency anemia, unspecified: Secondary | ICD-10-CM | POA: Diagnosis not present

## 2014-11-25 DIAGNOSIS — N2581 Secondary hyperparathyroidism of renal origin: Secondary | ICD-10-CM | POA: Diagnosis not present

## 2014-11-25 DIAGNOSIS — N186 End stage renal disease: Secondary | ICD-10-CM | POA: Diagnosis not present

## 2014-11-28 DIAGNOSIS — D509 Iron deficiency anemia, unspecified: Secondary | ICD-10-CM | POA: Diagnosis not present

## 2014-11-28 DIAGNOSIS — D631 Anemia in chronic kidney disease: Secondary | ICD-10-CM | POA: Diagnosis not present

## 2014-11-28 DIAGNOSIS — N2581 Secondary hyperparathyroidism of renal origin: Secondary | ICD-10-CM | POA: Diagnosis not present

## 2014-11-28 DIAGNOSIS — N186 End stage renal disease: Secondary | ICD-10-CM | POA: Diagnosis not present

## 2014-11-28 DIAGNOSIS — A419 Sepsis, unspecified organism: Secondary | ICD-10-CM | POA: Diagnosis not present

## 2014-11-28 DIAGNOSIS — Z992 Dependence on renal dialysis: Secondary | ICD-10-CM | POA: Diagnosis not present

## 2014-11-29 DIAGNOSIS — N186 End stage renal disease: Secondary | ICD-10-CM | POA: Diagnosis not present

## 2014-11-29 DIAGNOSIS — D509 Iron deficiency anemia, unspecified: Secondary | ICD-10-CM | POA: Diagnosis not present

## 2014-11-29 DIAGNOSIS — J449 Chronic obstructive pulmonary disease, unspecified: Secondary | ICD-10-CM | POA: Diagnosis not present

## 2014-11-29 DIAGNOSIS — G546 Phantom limb syndrome with pain: Secondary | ICD-10-CM | POA: Diagnosis not present

## 2014-11-29 DIAGNOSIS — A419 Sepsis, unspecified organism: Secondary | ICD-10-CM | POA: Diagnosis not present

## 2014-11-29 DIAGNOSIS — D631 Anemia in chronic kidney disease: Secondary | ICD-10-CM | POA: Diagnosis not present

## 2014-11-29 DIAGNOSIS — G8929 Other chronic pain: Secondary | ICD-10-CM | POA: Diagnosis not present

## 2014-11-29 DIAGNOSIS — Z992 Dependence on renal dialysis: Secondary | ICD-10-CM | POA: Diagnosis not present

## 2014-11-29 DIAGNOSIS — Z4781 Encounter for orthopedic aftercare following surgical amputation: Secondary | ICD-10-CM | POA: Diagnosis not present

## 2014-11-29 DIAGNOSIS — I120 Hypertensive chronic kidney disease with stage 5 chronic kidney disease or end stage renal disease: Secondary | ICD-10-CM | POA: Diagnosis not present

## 2014-11-29 DIAGNOSIS — N2581 Secondary hyperparathyroidism of renal origin: Secondary | ICD-10-CM | POA: Diagnosis not present

## 2014-12-01 DIAGNOSIS — Z992 Dependence on renal dialysis: Secondary | ICD-10-CM | POA: Diagnosis not present

## 2014-12-01 DIAGNOSIS — G8929 Other chronic pain: Secondary | ICD-10-CM | POA: Diagnosis not present

## 2014-12-01 DIAGNOSIS — A419 Sepsis, unspecified organism: Secondary | ICD-10-CM | POA: Diagnosis not present

## 2014-12-01 DIAGNOSIS — N186 End stage renal disease: Secondary | ICD-10-CM | POA: Diagnosis not present

## 2014-12-01 DIAGNOSIS — Z4781 Encounter for orthopedic aftercare following surgical amputation: Secondary | ICD-10-CM | POA: Diagnosis not present

## 2014-12-01 DIAGNOSIS — N2581 Secondary hyperparathyroidism of renal origin: Secondary | ICD-10-CM | POA: Diagnosis not present

## 2014-12-01 DIAGNOSIS — D509 Iron deficiency anemia, unspecified: Secondary | ICD-10-CM | POA: Diagnosis not present

## 2014-12-01 DIAGNOSIS — D631 Anemia in chronic kidney disease: Secondary | ICD-10-CM | POA: Diagnosis not present

## 2014-12-01 DIAGNOSIS — G546 Phantom limb syndrome with pain: Secondary | ICD-10-CM | POA: Diagnosis not present

## 2014-12-01 DIAGNOSIS — J449 Chronic obstructive pulmonary disease, unspecified: Secondary | ICD-10-CM | POA: Diagnosis not present

## 2014-12-01 DIAGNOSIS — I120 Hypertensive chronic kidney disease with stage 5 chronic kidney disease or end stage renal disease: Secondary | ICD-10-CM | POA: Diagnosis not present

## 2014-12-04 DIAGNOSIS — D631 Anemia in chronic kidney disease: Secondary | ICD-10-CM | POA: Diagnosis not present

## 2014-12-04 DIAGNOSIS — D509 Iron deficiency anemia, unspecified: Secondary | ICD-10-CM | POA: Diagnosis not present

## 2014-12-04 DIAGNOSIS — A419 Sepsis, unspecified organism: Secondary | ICD-10-CM | POA: Diagnosis not present

## 2014-12-04 DIAGNOSIS — N186 End stage renal disease: Secondary | ICD-10-CM | POA: Diagnosis not present

## 2014-12-04 DIAGNOSIS — N2581 Secondary hyperparathyroidism of renal origin: Secondary | ICD-10-CM | POA: Diagnosis not present

## 2014-12-04 DIAGNOSIS — Z992 Dependence on renal dialysis: Secondary | ICD-10-CM | POA: Diagnosis not present

## 2014-12-05 DIAGNOSIS — G546 Phantom limb syndrome with pain: Secondary | ICD-10-CM | POA: Diagnosis not present

## 2014-12-05 DIAGNOSIS — G8929 Other chronic pain: Secondary | ICD-10-CM | POA: Diagnosis not present

## 2014-12-05 DIAGNOSIS — N186 End stage renal disease: Secondary | ICD-10-CM | POA: Diagnosis not present

## 2014-12-05 DIAGNOSIS — Z4781 Encounter for orthopedic aftercare following surgical amputation: Secondary | ICD-10-CM | POA: Diagnosis not present

## 2014-12-05 DIAGNOSIS — J449 Chronic obstructive pulmonary disease, unspecified: Secondary | ICD-10-CM | POA: Diagnosis not present

## 2014-12-05 DIAGNOSIS — I120 Hypertensive chronic kidney disease with stage 5 chronic kidney disease or end stage renal disease: Secondary | ICD-10-CM | POA: Diagnosis not present

## 2014-12-06 DIAGNOSIS — N186 End stage renal disease: Secondary | ICD-10-CM | POA: Diagnosis not present

## 2014-12-06 DIAGNOSIS — N2581 Secondary hyperparathyroidism of renal origin: Secondary | ICD-10-CM | POA: Diagnosis not present

## 2014-12-06 DIAGNOSIS — Z992 Dependence on renal dialysis: Secondary | ICD-10-CM | POA: Diagnosis not present

## 2014-12-06 DIAGNOSIS — A419 Sepsis, unspecified organism: Secondary | ICD-10-CM | POA: Diagnosis not present

## 2014-12-06 DIAGNOSIS — D509 Iron deficiency anemia, unspecified: Secondary | ICD-10-CM | POA: Diagnosis not present

## 2014-12-06 DIAGNOSIS — D631 Anemia in chronic kidney disease: Secondary | ICD-10-CM | POA: Diagnosis not present

## 2014-12-07 DIAGNOSIS — M549 Dorsalgia, unspecified: Secondary | ICD-10-CM | POA: Diagnosis not present

## 2014-12-07 DIAGNOSIS — I10 Essential (primary) hypertension: Secondary | ICD-10-CM | POA: Diagnosis not present

## 2014-12-08 DIAGNOSIS — G546 Phantom limb syndrome with pain: Secondary | ICD-10-CM | POA: Diagnosis not present

## 2014-12-08 DIAGNOSIS — I120 Hypertensive chronic kidney disease with stage 5 chronic kidney disease or end stage renal disease: Secondary | ICD-10-CM | POA: Diagnosis not present

## 2014-12-08 DIAGNOSIS — D509 Iron deficiency anemia, unspecified: Secondary | ICD-10-CM | POA: Diagnosis not present

## 2014-12-08 DIAGNOSIS — Z4781 Encounter for orthopedic aftercare following surgical amputation: Secondary | ICD-10-CM | POA: Diagnosis not present

## 2014-12-08 DIAGNOSIS — J449 Chronic obstructive pulmonary disease, unspecified: Secondary | ICD-10-CM | POA: Diagnosis not present

## 2014-12-08 DIAGNOSIS — N186 End stage renal disease: Secondary | ICD-10-CM | POA: Diagnosis not present

## 2014-12-08 DIAGNOSIS — A419 Sepsis, unspecified organism: Secondary | ICD-10-CM | POA: Diagnosis not present

## 2014-12-08 DIAGNOSIS — Z992 Dependence on renal dialysis: Secondary | ICD-10-CM | POA: Diagnosis not present

## 2014-12-08 DIAGNOSIS — G8929 Other chronic pain: Secondary | ICD-10-CM | POA: Diagnosis not present

## 2014-12-08 DIAGNOSIS — D631 Anemia in chronic kidney disease: Secondary | ICD-10-CM | POA: Diagnosis not present

## 2014-12-08 DIAGNOSIS — N2581 Secondary hyperparathyroidism of renal origin: Secondary | ICD-10-CM | POA: Diagnosis not present

## 2014-12-11 DIAGNOSIS — N186 End stage renal disease: Secondary | ICD-10-CM | POA: Diagnosis not present

## 2014-12-11 DIAGNOSIS — Z4781 Encounter for orthopedic aftercare following surgical amputation: Secondary | ICD-10-CM | POA: Diagnosis not present

## 2014-12-11 DIAGNOSIS — D509 Iron deficiency anemia, unspecified: Secondary | ICD-10-CM | POA: Diagnosis not present

## 2014-12-11 DIAGNOSIS — J449 Chronic obstructive pulmonary disease, unspecified: Secondary | ICD-10-CM | POA: Diagnosis not present

## 2014-12-11 DIAGNOSIS — D631 Anemia in chronic kidney disease: Secondary | ICD-10-CM | POA: Diagnosis not present

## 2014-12-11 DIAGNOSIS — I120 Hypertensive chronic kidney disease with stage 5 chronic kidney disease or end stage renal disease: Secondary | ICD-10-CM | POA: Diagnosis not present

## 2014-12-11 DIAGNOSIS — A419 Sepsis, unspecified organism: Secondary | ICD-10-CM | POA: Diagnosis not present

## 2014-12-11 DIAGNOSIS — N2581 Secondary hyperparathyroidism of renal origin: Secondary | ICD-10-CM | POA: Diagnosis not present

## 2014-12-11 DIAGNOSIS — Z992 Dependence on renal dialysis: Secondary | ICD-10-CM | POA: Diagnosis not present

## 2014-12-11 DIAGNOSIS — G8929 Other chronic pain: Secondary | ICD-10-CM | POA: Diagnosis not present

## 2014-12-11 DIAGNOSIS — G546 Phantom limb syndrome with pain: Secondary | ICD-10-CM | POA: Diagnosis not present

## 2014-12-12 DIAGNOSIS — A419 Sepsis, unspecified organism: Secondary | ICD-10-CM | POA: Diagnosis not present

## 2014-12-12 DIAGNOSIS — D631 Anemia in chronic kidney disease: Secondary | ICD-10-CM | POA: Diagnosis not present

## 2014-12-12 DIAGNOSIS — N186 End stage renal disease: Secondary | ICD-10-CM | POA: Diagnosis not present

## 2014-12-12 DIAGNOSIS — D509 Iron deficiency anemia, unspecified: Secondary | ICD-10-CM | POA: Diagnosis not present

## 2014-12-12 DIAGNOSIS — N2581 Secondary hyperparathyroidism of renal origin: Secondary | ICD-10-CM | POA: Diagnosis not present

## 2014-12-12 DIAGNOSIS — Z992 Dependence on renal dialysis: Secondary | ICD-10-CM | POA: Diagnosis not present

## 2014-12-13 DIAGNOSIS — Z87442 Personal history of urinary calculi: Secondary | ICD-10-CM | POA: Diagnosis not present

## 2014-12-13 DIAGNOSIS — D631 Anemia in chronic kidney disease: Secondary | ICD-10-CM | POA: Diagnosis not present

## 2014-12-13 DIAGNOSIS — Z992 Dependence on renal dialysis: Secondary | ICD-10-CM | POA: Diagnosis not present

## 2014-12-13 DIAGNOSIS — I120 Hypertensive chronic kidney disease with stage 5 chronic kidney disease or end stage renal disease: Secondary | ICD-10-CM | POA: Diagnosis not present

## 2014-12-13 DIAGNOSIS — Z8673 Personal history of transient ischemic attack (TIA), and cerebral infarction without residual deficits: Secondary | ICD-10-CM | POA: Diagnosis not present

## 2014-12-13 DIAGNOSIS — F419 Anxiety disorder, unspecified: Secondary | ICD-10-CM | POA: Diagnosis not present

## 2014-12-13 DIAGNOSIS — F329 Major depressive disorder, single episode, unspecified: Secondary | ICD-10-CM | POA: Diagnosis not present

## 2014-12-13 DIAGNOSIS — E271 Primary adrenocortical insufficiency: Secondary | ICD-10-CM | POA: Diagnosis not present

## 2014-12-13 DIAGNOSIS — Z9889 Other specified postprocedural states: Secondary | ICD-10-CM | POA: Diagnosis not present

## 2014-12-13 DIAGNOSIS — J449 Chronic obstructive pulmonary disease, unspecified: Secondary | ICD-10-CM | POA: Diagnosis not present

## 2014-12-13 DIAGNOSIS — N2581 Secondary hyperparathyroidism of renal origin: Secondary | ICD-10-CM | POA: Diagnosis not present

## 2014-12-13 DIAGNOSIS — Z952 Presence of prosthetic heart valve: Secondary | ICD-10-CM | POA: Diagnosis not present

## 2014-12-13 DIAGNOSIS — Z905 Acquired absence of kidney: Secondary | ICD-10-CM | POA: Diagnosis not present

## 2014-12-13 DIAGNOSIS — G8929 Other chronic pain: Secondary | ICD-10-CM | POA: Diagnosis not present

## 2014-12-13 DIAGNOSIS — Z7901 Long term (current) use of anticoagulants: Secondary | ICD-10-CM | POA: Diagnosis not present

## 2014-12-13 DIAGNOSIS — Z89432 Acquired absence of left foot: Secondary | ICD-10-CM | POA: Diagnosis not present

## 2014-12-13 DIAGNOSIS — Z7952 Long term (current) use of systemic steroids: Secondary | ICD-10-CM | POA: Diagnosis not present

## 2014-12-13 DIAGNOSIS — Z89511 Acquired absence of right leg below knee: Secondary | ICD-10-CM | POA: Diagnosis not present

## 2014-12-13 DIAGNOSIS — Z79899 Other long term (current) drug therapy: Secondary | ICD-10-CM | POA: Diagnosis not present

## 2014-12-13 DIAGNOSIS — N186 End stage renal disease: Secondary | ICD-10-CM | POA: Diagnosis not present

## 2014-12-13 DIAGNOSIS — Z4902 Encounter for fitting and adjustment of peritoneal dialysis catheter: Secondary | ICD-10-CM | POA: Diagnosis not present

## 2014-12-13 DIAGNOSIS — Z89512 Acquired absence of left leg below knee: Secondary | ICD-10-CM | POA: Diagnosis not present

## 2014-12-13 DIAGNOSIS — Z885 Allergy status to narcotic agent status: Secondary | ICD-10-CM | POA: Diagnosis not present

## 2014-12-13 DIAGNOSIS — F17210 Nicotine dependence, cigarettes, uncomplicated: Secondary | ICD-10-CM | POA: Diagnosis not present

## 2014-12-14 DIAGNOSIS — J449 Chronic obstructive pulmonary disease, unspecified: Secondary | ICD-10-CM | POA: Diagnosis not present

## 2014-12-14 DIAGNOSIS — Z992 Dependence on renal dialysis: Secondary | ICD-10-CM | POA: Diagnosis not present

## 2014-12-14 DIAGNOSIS — I120 Hypertensive chronic kidney disease with stage 5 chronic kidney disease or end stage renal disease: Secondary | ICD-10-CM | POA: Diagnosis not present

## 2014-12-14 DIAGNOSIS — N2581 Secondary hyperparathyroidism of renal origin: Secondary | ICD-10-CM | POA: Diagnosis not present

## 2014-12-14 DIAGNOSIS — T85691A Other mechanical complication of intraperitoneal dialysis catheter, initial encounter: Secondary | ICD-10-CM | POA: Diagnosis not present

## 2014-12-14 DIAGNOSIS — Z4902 Encounter for fitting and adjustment of peritoneal dialysis catheter: Secondary | ICD-10-CM | POA: Diagnosis not present

## 2014-12-14 DIAGNOSIS — E271 Primary adrenocortical insufficiency: Secondary | ICD-10-CM | POA: Diagnosis not present

## 2014-12-14 DIAGNOSIS — N186 End stage renal disease: Secondary | ICD-10-CM | POA: Diagnosis not present

## 2014-12-14 DIAGNOSIS — Z466 Encounter for fitting and adjustment of urinary device: Secondary | ICD-10-CM | POA: Diagnosis not present

## 2014-12-15 DIAGNOSIS — N2581 Secondary hyperparathyroidism of renal origin: Secondary | ICD-10-CM | POA: Diagnosis not present

## 2014-12-15 DIAGNOSIS — D509 Iron deficiency anemia, unspecified: Secondary | ICD-10-CM | POA: Diagnosis not present

## 2014-12-15 DIAGNOSIS — D631 Anemia in chronic kidney disease: Secondary | ICD-10-CM | POA: Diagnosis not present

## 2014-12-15 DIAGNOSIS — A419 Sepsis, unspecified organism: Secondary | ICD-10-CM | POA: Diagnosis not present

## 2014-12-15 DIAGNOSIS — N186 End stage renal disease: Secondary | ICD-10-CM | POA: Diagnosis not present

## 2014-12-15 DIAGNOSIS — Z992 Dependence on renal dialysis: Secondary | ICD-10-CM | POA: Diagnosis not present

## 2014-12-18 DIAGNOSIS — A419 Sepsis, unspecified organism: Secondary | ICD-10-CM | POA: Diagnosis not present

## 2014-12-18 DIAGNOSIS — N186 End stage renal disease: Secondary | ICD-10-CM | POA: Diagnosis not present

## 2014-12-18 DIAGNOSIS — Z992 Dependence on renal dialysis: Secondary | ICD-10-CM | POA: Diagnosis not present

## 2014-12-18 DIAGNOSIS — D631 Anemia in chronic kidney disease: Secondary | ICD-10-CM | POA: Diagnosis not present

## 2014-12-18 DIAGNOSIS — D509 Iron deficiency anemia, unspecified: Secondary | ICD-10-CM | POA: Diagnosis not present

## 2014-12-18 DIAGNOSIS — N2581 Secondary hyperparathyroidism of renal origin: Secondary | ICD-10-CM | POA: Diagnosis not present

## 2014-12-19 DIAGNOSIS — Z4781 Encounter for orthopedic aftercare following surgical amputation: Secondary | ICD-10-CM | POA: Diagnosis not present

## 2014-12-19 DIAGNOSIS — J449 Chronic obstructive pulmonary disease, unspecified: Secondary | ICD-10-CM | POA: Diagnosis not present

## 2014-12-19 DIAGNOSIS — G8929 Other chronic pain: Secondary | ICD-10-CM | POA: Diagnosis not present

## 2014-12-19 DIAGNOSIS — I120 Hypertensive chronic kidney disease with stage 5 chronic kidney disease or end stage renal disease: Secondary | ICD-10-CM | POA: Diagnosis not present

## 2014-12-19 DIAGNOSIS — G546 Phantom limb syndrome with pain: Secondary | ICD-10-CM | POA: Diagnosis not present

## 2014-12-19 DIAGNOSIS — N186 End stage renal disease: Secondary | ICD-10-CM | POA: Diagnosis not present

## 2014-12-20 DIAGNOSIS — N2581 Secondary hyperparathyroidism of renal origin: Secondary | ICD-10-CM | POA: Diagnosis not present

## 2014-12-20 DIAGNOSIS — B9689 Other specified bacterial agents as the cause of diseases classified elsewhere: Secondary | ICD-10-CM | POA: Diagnosis not present

## 2014-12-20 DIAGNOSIS — Z992 Dependence on renal dialysis: Secondary | ICD-10-CM | POA: Diagnosis not present

## 2014-12-20 DIAGNOSIS — N186 End stage renal disease: Secondary | ICD-10-CM | POA: Diagnosis not present

## 2014-12-20 DIAGNOSIS — R7881 Bacteremia: Secondary | ICD-10-CM | POA: Diagnosis not present

## 2014-12-20 DIAGNOSIS — D631 Anemia in chronic kidney disease: Secondary | ICD-10-CM | POA: Diagnosis not present

## 2014-12-20 DIAGNOSIS — D509 Iron deficiency anemia, unspecified: Secondary | ICD-10-CM | POA: Diagnosis not present

## 2014-12-21 DIAGNOSIS — N186 End stage renal disease: Secondary | ICD-10-CM | POA: Diagnosis not present

## 2014-12-21 DIAGNOSIS — I120 Hypertensive chronic kidney disease with stage 5 chronic kidney disease or end stage renal disease: Secondary | ICD-10-CM | POA: Diagnosis not present

## 2014-12-21 DIAGNOSIS — J449 Chronic obstructive pulmonary disease, unspecified: Secondary | ICD-10-CM | POA: Diagnosis not present

## 2014-12-21 DIAGNOSIS — G546 Phantom limb syndrome with pain: Secondary | ICD-10-CM | POA: Diagnosis not present

## 2014-12-21 DIAGNOSIS — G8929 Other chronic pain: Secondary | ICD-10-CM | POA: Diagnosis not present

## 2014-12-21 DIAGNOSIS — Z4781 Encounter for orthopedic aftercare following surgical amputation: Secondary | ICD-10-CM | POA: Diagnosis not present

## 2014-12-22 DIAGNOSIS — N2581 Secondary hyperparathyroidism of renal origin: Secondary | ICD-10-CM | POA: Diagnosis not present

## 2014-12-22 DIAGNOSIS — N186 End stage renal disease: Secondary | ICD-10-CM | POA: Diagnosis not present

## 2014-12-22 DIAGNOSIS — B9689 Other specified bacterial agents as the cause of diseases classified elsewhere: Secondary | ICD-10-CM | POA: Diagnosis not present

## 2014-12-22 DIAGNOSIS — D631 Anemia in chronic kidney disease: Secondary | ICD-10-CM | POA: Diagnosis not present

## 2014-12-22 DIAGNOSIS — R7881 Bacteremia: Secondary | ICD-10-CM | POA: Diagnosis not present

## 2014-12-22 DIAGNOSIS — D509 Iron deficiency anemia, unspecified: Secondary | ICD-10-CM | POA: Diagnosis not present

## 2014-12-25 DIAGNOSIS — D631 Anemia in chronic kidney disease: Secondary | ICD-10-CM | POA: Diagnosis not present

## 2014-12-25 DIAGNOSIS — D509 Iron deficiency anemia, unspecified: Secondary | ICD-10-CM | POA: Diagnosis not present

## 2014-12-25 DIAGNOSIS — N186 End stage renal disease: Secondary | ICD-10-CM | POA: Diagnosis not present

## 2014-12-25 DIAGNOSIS — N2581 Secondary hyperparathyroidism of renal origin: Secondary | ICD-10-CM | POA: Diagnosis not present

## 2014-12-25 DIAGNOSIS — B9689 Other specified bacterial agents as the cause of diseases classified elsewhere: Secondary | ICD-10-CM | POA: Diagnosis not present

## 2014-12-25 DIAGNOSIS — R7881 Bacteremia: Secondary | ICD-10-CM | POA: Diagnosis not present

## 2014-12-26 DIAGNOSIS — Z4781 Encounter for orthopedic aftercare following surgical amputation: Secondary | ICD-10-CM | POA: Diagnosis not present

## 2014-12-26 DIAGNOSIS — J449 Chronic obstructive pulmonary disease, unspecified: Secondary | ICD-10-CM | POA: Diagnosis not present

## 2014-12-26 DIAGNOSIS — N186 End stage renal disease: Secondary | ICD-10-CM | POA: Diagnosis not present

## 2014-12-26 DIAGNOSIS — G546 Phantom limb syndrome with pain: Secondary | ICD-10-CM | POA: Diagnosis not present

## 2014-12-26 DIAGNOSIS — I120 Hypertensive chronic kidney disease with stage 5 chronic kidney disease or end stage renal disease: Secondary | ICD-10-CM | POA: Diagnosis not present

## 2014-12-26 DIAGNOSIS — G8929 Other chronic pain: Secondary | ICD-10-CM | POA: Diagnosis not present

## 2014-12-27 DIAGNOSIS — N2581 Secondary hyperparathyroidism of renal origin: Secondary | ICD-10-CM | POA: Diagnosis not present

## 2014-12-27 DIAGNOSIS — N186 End stage renal disease: Secondary | ICD-10-CM | POA: Diagnosis not present

## 2014-12-27 DIAGNOSIS — R7881 Bacteremia: Secondary | ICD-10-CM | POA: Diagnosis not present

## 2014-12-27 DIAGNOSIS — B9689 Other specified bacterial agents as the cause of diseases classified elsewhere: Secondary | ICD-10-CM | POA: Diagnosis not present

## 2014-12-27 DIAGNOSIS — D631 Anemia in chronic kidney disease: Secondary | ICD-10-CM | POA: Diagnosis not present

## 2014-12-27 DIAGNOSIS — D509 Iron deficiency anemia, unspecified: Secondary | ICD-10-CM | POA: Diagnosis not present

## 2014-12-29 DIAGNOSIS — D509 Iron deficiency anemia, unspecified: Secondary | ICD-10-CM | POA: Diagnosis not present

## 2014-12-29 DIAGNOSIS — J449 Chronic obstructive pulmonary disease, unspecified: Secondary | ICD-10-CM | POA: Diagnosis not present

## 2014-12-29 DIAGNOSIS — R7881 Bacteremia: Secondary | ICD-10-CM | POA: Diagnosis not present

## 2014-12-29 DIAGNOSIS — N2581 Secondary hyperparathyroidism of renal origin: Secondary | ICD-10-CM | POA: Diagnosis not present

## 2014-12-29 DIAGNOSIS — D631 Anemia in chronic kidney disease: Secondary | ICD-10-CM | POA: Diagnosis not present

## 2014-12-29 DIAGNOSIS — Z4781 Encounter for orthopedic aftercare following surgical amputation: Secondary | ICD-10-CM | POA: Diagnosis not present

## 2014-12-29 DIAGNOSIS — I120 Hypertensive chronic kidney disease with stage 5 chronic kidney disease or end stage renal disease: Secondary | ICD-10-CM | POA: Diagnosis not present

## 2014-12-29 DIAGNOSIS — G546 Phantom limb syndrome with pain: Secondary | ICD-10-CM | POA: Diagnosis not present

## 2014-12-29 DIAGNOSIS — B9689 Other specified bacterial agents as the cause of diseases classified elsewhere: Secondary | ICD-10-CM | POA: Diagnosis not present

## 2014-12-29 DIAGNOSIS — G8929 Other chronic pain: Secondary | ICD-10-CM | POA: Diagnosis not present

## 2014-12-29 DIAGNOSIS — N186 End stage renal disease: Secondary | ICD-10-CM | POA: Diagnosis not present

## 2015-01-01 DIAGNOSIS — R7881 Bacteremia: Secondary | ICD-10-CM | POA: Diagnosis not present

## 2015-01-01 DIAGNOSIS — N2581 Secondary hyperparathyroidism of renal origin: Secondary | ICD-10-CM | POA: Diagnosis not present

## 2015-01-01 DIAGNOSIS — N186 End stage renal disease: Secondary | ICD-10-CM | POA: Diagnosis not present

## 2015-01-01 DIAGNOSIS — D509 Iron deficiency anemia, unspecified: Secondary | ICD-10-CM | POA: Diagnosis not present

## 2015-01-01 DIAGNOSIS — D631 Anemia in chronic kidney disease: Secondary | ICD-10-CM | POA: Diagnosis not present

## 2015-01-01 DIAGNOSIS — B9689 Other specified bacterial agents as the cause of diseases classified elsewhere: Secondary | ICD-10-CM | POA: Diagnosis not present

## 2015-01-02 DIAGNOSIS — Z4781 Encounter for orthopedic aftercare following surgical amputation: Secondary | ICD-10-CM | POA: Diagnosis not present

## 2015-01-02 DIAGNOSIS — J449 Chronic obstructive pulmonary disease, unspecified: Secondary | ICD-10-CM | POA: Diagnosis not present

## 2015-01-02 DIAGNOSIS — G546 Phantom limb syndrome with pain: Secondary | ICD-10-CM | POA: Diagnosis not present

## 2015-01-02 DIAGNOSIS — I120 Hypertensive chronic kidney disease with stage 5 chronic kidney disease or end stage renal disease: Secondary | ICD-10-CM | POA: Diagnosis not present

## 2015-01-02 DIAGNOSIS — N186 End stage renal disease: Secondary | ICD-10-CM | POA: Diagnosis not present

## 2015-01-02 DIAGNOSIS — G8929 Other chronic pain: Secondary | ICD-10-CM | POA: Diagnosis not present

## 2015-01-03 DIAGNOSIS — R7881 Bacteremia: Secondary | ICD-10-CM | POA: Diagnosis not present

## 2015-01-03 DIAGNOSIS — N2581 Secondary hyperparathyroidism of renal origin: Secondary | ICD-10-CM | POA: Diagnosis not present

## 2015-01-03 DIAGNOSIS — B9689 Other specified bacterial agents as the cause of diseases classified elsewhere: Secondary | ICD-10-CM | POA: Diagnosis not present

## 2015-01-03 DIAGNOSIS — N186 End stage renal disease: Secondary | ICD-10-CM | POA: Diagnosis not present

## 2015-01-03 DIAGNOSIS — D509 Iron deficiency anemia, unspecified: Secondary | ICD-10-CM | POA: Diagnosis not present

## 2015-01-03 DIAGNOSIS — D631 Anemia in chronic kidney disease: Secondary | ICD-10-CM | POA: Diagnosis not present

## 2015-01-04 DIAGNOSIS — I120 Hypertensive chronic kidney disease with stage 5 chronic kidney disease or end stage renal disease: Secondary | ICD-10-CM | POA: Diagnosis not present

## 2015-01-04 DIAGNOSIS — N186 End stage renal disease: Secondary | ICD-10-CM | POA: Diagnosis not present

## 2015-01-04 DIAGNOSIS — G8929 Other chronic pain: Secondary | ICD-10-CM | POA: Diagnosis not present

## 2015-01-04 DIAGNOSIS — Z4781 Encounter for orthopedic aftercare following surgical amputation: Secondary | ICD-10-CM | POA: Diagnosis not present

## 2015-01-04 DIAGNOSIS — J449 Chronic obstructive pulmonary disease, unspecified: Secondary | ICD-10-CM | POA: Diagnosis not present

## 2015-01-04 DIAGNOSIS — G546 Phantom limb syndrome with pain: Secondary | ICD-10-CM | POA: Diagnosis not present

## 2015-01-05 DIAGNOSIS — B9689 Other specified bacterial agents as the cause of diseases classified elsewhere: Secondary | ICD-10-CM | POA: Diagnosis not present

## 2015-01-05 DIAGNOSIS — N186 End stage renal disease: Secondary | ICD-10-CM | POA: Diagnosis not present

## 2015-01-05 DIAGNOSIS — N2581 Secondary hyperparathyroidism of renal origin: Secondary | ICD-10-CM | POA: Diagnosis not present

## 2015-01-05 DIAGNOSIS — D631 Anemia in chronic kidney disease: Secondary | ICD-10-CM | POA: Diagnosis not present

## 2015-01-05 DIAGNOSIS — R7881 Bacteremia: Secondary | ICD-10-CM | POA: Diagnosis not present

## 2015-01-05 DIAGNOSIS — D509 Iron deficiency anemia, unspecified: Secondary | ICD-10-CM | POA: Diagnosis not present

## 2015-01-08 DIAGNOSIS — N2581 Secondary hyperparathyroidism of renal origin: Secondary | ICD-10-CM | POA: Diagnosis not present

## 2015-01-08 DIAGNOSIS — D631 Anemia in chronic kidney disease: Secondary | ICD-10-CM | POA: Diagnosis not present

## 2015-01-08 DIAGNOSIS — D509 Iron deficiency anemia, unspecified: Secondary | ICD-10-CM | POA: Diagnosis not present

## 2015-01-08 DIAGNOSIS — N186 End stage renal disease: Secondary | ICD-10-CM | POA: Diagnosis not present

## 2015-01-08 DIAGNOSIS — R7881 Bacteremia: Secondary | ICD-10-CM | POA: Diagnosis not present

## 2015-01-08 DIAGNOSIS — B9689 Other specified bacterial agents as the cause of diseases classified elsewhere: Secondary | ICD-10-CM | POA: Diagnosis not present

## 2015-01-09 DIAGNOSIS — I120 Hypertensive chronic kidney disease with stage 5 chronic kidney disease or end stage renal disease: Secondary | ICD-10-CM | POA: Diagnosis not present

## 2015-01-09 DIAGNOSIS — Z4781 Encounter for orthopedic aftercare following surgical amputation: Secondary | ICD-10-CM | POA: Diagnosis not present

## 2015-01-09 DIAGNOSIS — G546 Phantom limb syndrome with pain: Secondary | ICD-10-CM | POA: Diagnosis not present

## 2015-01-09 DIAGNOSIS — J449 Chronic obstructive pulmonary disease, unspecified: Secondary | ICD-10-CM | POA: Diagnosis not present

## 2015-01-09 DIAGNOSIS — G8929 Other chronic pain: Secondary | ICD-10-CM | POA: Diagnosis not present

## 2015-01-09 DIAGNOSIS — N186 End stage renal disease: Secondary | ICD-10-CM | POA: Diagnosis not present

## 2015-01-10 DIAGNOSIS — R7881 Bacteremia: Secondary | ICD-10-CM | POA: Diagnosis not present

## 2015-01-10 DIAGNOSIS — D509 Iron deficiency anemia, unspecified: Secondary | ICD-10-CM | POA: Diagnosis not present

## 2015-01-10 DIAGNOSIS — D631 Anemia in chronic kidney disease: Secondary | ICD-10-CM | POA: Diagnosis not present

## 2015-01-10 DIAGNOSIS — B9689 Other specified bacterial agents as the cause of diseases classified elsewhere: Secondary | ICD-10-CM | POA: Diagnosis not present

## 2015-01-10 DIAGNOSIS — N186 End stage renal disease: Secondary | ICD-10-CM | POA: Diagnosis not present

## 2015-01-10 DIAGNOSIS — N2581 Secondary hyperparathyroidism of renal origin: Secondary | ICD-10-CM | POA: Diagnosis not present

## 2015-01-11 DIAGNOSIS — Z8249 Family history of ischemic heart disease and other diseases of the circulatory system: Secondary | ICD-10-CM | POA: Diagnosis not present

## 2015-01-11 DIAGNOSIS — A4189 Other specified sepsis: Secondary | ICD-10-CM | POA: Diagnosis not present

## 2015-01-11 DIAGNOSIS — Z992 Dependence on renal dialysis: Secondary | ICD-10-CM | POA: Diagnosis not present

## 2015-01-11 DIAGNOSIS — Z86718 Personal history of other venous thrombosis and embolism: Secondary | ICD-10-CM | POA: Diagnosis not present

## 2015-01-11 DIAGNOSIS — I739 Peripheral vascular disease, unspecified: Secondary | ICD-10-CM | POA: Diagnosis present

## 2015-01-11 DIAGNOSIS — E039 Hypothyroidism, unspecified: Secondary | ICD-10-CM | POA: Diagnosis present

## 2015-01-11 DIAGNOSIS — F419 Anxiety disorder, unspecified: Secondary | ICD-10-CM | POA: Diagnosis present

## 2015-01-11 DIAGNOSIS — Z952 Presence of prosthetic heart valve: Secondary | ICD-10-CM | POA: Diagnosis not present

## 2015-01-11 DIAGNOSIS — D649 Anemia, unspecified: Secondary | ICD-10-CM | POA: Diagnosis present

## 2015-01-11 DIAGNOSIS — I509 Heart failure, unspecified: Secondary | ICD-10-CM | POA: Diagnosis not present

## 2015-01-11 DIAGNOSIS — R05 Cough: Secondary | ICD-10-CM | POA: Diagnosis not present

## 2015-01-11 DIAGNOSIS — A047 Enterocolitis due to Clostridium difficile: Secondary | ICD-10-CM | POA: Diagnosis present

## 2015-01-11 DIAGNOSIS — R079 Chest pain, unspecified: Secondary | ICD-10-CM | POA: Diagnosis not present

## 2015-01-11 DIAGNOSIS — Z4781 Encounter for orthopedic aftercare following surgical amputation: Secondary | ICD-10-CM | POA: Diagnosis not present

## 2015-01-11 DIAGNOSIS — G8929 Other chronic pain: Secondary | ICD-10-CM | POA: Diagnosis present

## 2015-01-11 DIAGNOSIS — I5042 Chronic combined systolic (congestive) and diastolic (congestive) heart failure: Secondary | ICD-10-CM | POA: Diagnosis not present

## 2015-01-11 DIAGNOSIS — M898X9 Other specified disorders of bone, unspecified site: Secondary | ICD-10-CM | POA: Diagnosis present

## 2015-01-11 DIAGNOSIS — G546 Phantom limb syndrome with pain: Secondary | ICD-10-CM | POA: Diagnosis not present

## 2015-01-11 DIAGNOSIS — Z885 Allergy status to narcotic agent status: Secondary | ICD-10-CM | POA: Diagnosis not present

## 2015-01-11 DIAGNOSIS — Z8489 Family history of other specified conditions: Secondary | ICD-10-CM | POA: Diagnosis not present

## 2015-01-11 DIAGNOSIS — R509 Fever, unspecified: Secondary | ICD-10-CM | POA: Diagnosis not present

## 2015-01-11 DIAGNOSIS — Z89511 Acquired absence of right leg below knee: Secondary | ICD-10-CM | POA: Diagnosis not present

## 2015-01-11 DIAGNOSIS — J449 Chronic obstructive pulmonary disease, unspecified: Secondary | ICD-10-CM | POA: Diagnosis present

## 2015-01-11 DIAGNOSIS — E271 Primary adrenocortical insufficiency: Secondary | ICD-10-CM | POA: Diagnosis not present

## 2015-01-11 DIAGNOSIS — Z954 Presence of other heart-valve replacement: Secondary | ICD-10-CM | POA: Diagnosis not present

## 2015-01-11 DIAGNOSIS — F17200 Nicotine dependence, unspecified, uncomplicated: Secondary | ICD-10-CM | POA: Diagnosis present

## 2015-01-11 DIAGNOSIS — N186 End stage renal disease: Secondary | ICD-10-CM | POA: Diagnosis not present

## 2015-01-11 DIAGNOSIS — I959 Hypotension, unspecified: Secondary | ICD-10-CM | POA: Diagnosis not present

## 2015-01-11 DIAGNOSIS — I120 Hypertensive chronic kidney disease with stage 5 chronic kidney disease or end stage renal disease: Secondary | ICD-10-CM | POA: Diagnosis present

## 2015-01-11 DIAGNOSIS — M545 Low back pain: Secondary | ICD-10-CM | POA: Diagnosis present

## 2015-01-11 DIAGNOSIS — Z89512 Acquired absence of left leg below knee: Secondary | ICD-10-CM | POA: Diagnosis not present

## 2015-01-11 DIAGNOSIS — Z800 Family history of malignant neoplasm of digestive organs: Secondary | ICD-10-CM | POA: Diagnosis not present

## 2015-01-17 DIAGNOSIS — B9689 Other specified bacterial agents as the cause of diseases classified elsewhere: Secondary | ICD-10-CM | POA: Diagnosis not present

## 2015-01-17 DIAGNOSIS — D509 Iron deficiency anemia, unspecified: Secondary | ICD-10-CM | POA: Diagnosis not present

## 2015-01-17 DIAGNOSIS — R7881 Bacteremia: Secondary | ICD-10-CM | POA: Diagnosis not present

## 2015-01-17 DIAGNOSIS — N2581 Secondary hyperparathyroidism of renal origin: Secondary | ICD-10-CM | POA: Diagnosis not present

## 2015-01-17 DIAGNOSIS — N186 End stage renal disease: Secondary | ICD-10-CM | POA: Diagnosis not present

## 2015-01-17 DIAGNOSIS — D631 Anemia in chronic kidney disease: Secondary | ICD-10-CM | POA: Diagnosis not present

## 2015-01-18 DIAGNOSIS — Z992 Dependence on renal dialysis: Secondary | ICD-10-CM | POA: Diagnosis not present

## 2015-01-18 DIAGNOSIS — N186 End stage renal disease: Secondary | ICD-10-CM | POA: Diagnosis not present

## 2015-01-19 DIAGNOSIS — Z23 Encounter for immunization: Secondary | ICD-10-CM | POA: Diagnosis not present

## 2015-01-19 DIAGNOSIS — D631 Anemia in chronic kidney disease: Secondary | ICD-10-CM | POA: Diagnosis not present

## 2015-01-19 DIAGNOSIS — B9689 Other specified bacterial agents as the cause of diseases classified elsewhere: Secondary | ICD-10-CM | POA: Diagnosis not present

## 2015-01-19 DIAGNOSIS — N186 End stage renal disease: Secondary | ICD-10-CM | POA: Diagnosis not present

## 2015-01-19 DIAGNOSIS — D509 Iron deficiency anemia, unspecified: Secondary | ICD-10-CM | POA: Diagnosis not present

## 2015-01-19 DIAGNOSIS — R7881 Bacteremia: Secondary | ICD-10-CM | POA: Diagnosis not present

## 2015-01-19 DIAGNOSIS — Z992 Dependence on renal dialysis: Secondary | ICD-10-CM | POA: Diagnosis not present

## 2015-01-22 DIAGNOSIS — Z23 Encounter for immunization: Secondary | ICD-10-CM | POA: Diagnosis not present

## 2015-01-22 DIAGNOSIS — B9689 Other specified bacterial agents as the cause of diseases classified elsewhere: Secondary | ICD-10-CM | POA: Diagnosis not present

## 2015-01-22 DIAGNOSIS — D631 Anemia in chronic kidney disease: Secondary | ICD-10-CM | POA: Diagnosis not present

## 2015-01-22 DIAGNOSIS — N186 End stage renal disease: Secondary | ICD-10-CM | POA: Diagnosis not present

## 2015-01-22 DIAGNOSIS — R7881 Bacteremia: Secondary | ICD-10-CM | POA: Diagnosis not present

## 2015-01-22 DIAGNOSIS — D509 Iron deficiency anemia, unspecified: Secondary | ICD-10-CM | POA: Diagnosis not present

## 2015-01-23 DIAGNOSIS — D631 Anemia in chronic kidney disease: Secondary | ICD-10-CM | POA: Diagnosis not present

## 2015-01-23 DIAGNOSIS — J449 Chronic obstructive pulmonary disease, unspecified: Secondary | ICD-10-CM | POA: Diagnosis not present

## 2015-01-23 DIAGNOSIS — A047 Enterocolitis due to Clostridium difficile: Secondary | ICD-10-CM | POA: Diagnosis not present

## 2015-01-23 DIAGNOSIS — I509 Heart failure, unspecified: Secondary | ICD-10-CM | POA: Diagnosis not present

## 2015-01-23 DIAGNOSIS — I739 Peripheral vascular disease, unspecified: Secondary | ICD-10-CM | POA: Diagnosis not present

## 2015-01-23 DIAGNOSIS — I120 Hypertensive chronic kidney disease with stage 5 chronic kidney disease or end stage renal disease: Secondary | ICD-10-CM | POA: Diagnosis not present

## 2015-01-23 DIAGNOSIS — D509 Iron deficiency anemia, unspecified: Secondary | ICD-10-CM | POA: Diagnosis not present

## 2015-01-23 DIAGNOSIS — Z23 Encounter for immunization: Secondary | ICD-10-CM | POA: Diagnosis not present

## 2015-01-23 DIAGNOSIS — R7881 Bacteremia: Secondary | ICD-10-CM | POA: Diagnosis not present

## 2015-01-23 DIAGNOSIS — N186 End stage renal disease: Secondary | ICD-10-CM | POA: Diagnosis not present

## 2015-01-23 DIAGNOSIS — B9689 Other specified bacterial agents as the cause of diseases classified elsewhere: Secondary | ICD-10-CM | POA: Diagnosis not present

## 2015-01-24 DIAGNOSIS — B9689 Other specified bacterial agents as the cause of diseases classified elsewhere: Secondary | ICD-10-CM | POA: Diagnosis not present

## 2015-01-24 DIAGNOSIS — R7881 Bacteremia: Secondary | ICD-10-CM | POA: Diagnosis not present

## 2015-01-24 DIAGNOSIS — D509 Iron deficiency anemia, unspecified: Secondary | ICD-10-CM | POA: Diagnosis not present

## 2015-01-24 DIAGNOSIS — N186 End stage renal disease: Secondary | ICD-10-CM | POA: Diagnosis not present

## 2015-01-24 DIAGNOSIS — Z23 Encounter for immunization: Secondary | ICD-10-CM | POA: Diagnosis not present

## 2015-01-24 DIAGNOSIS — D631 Anemia in chronic kidney disease: Secondary | ICD-10-CM | POA: Diagnosis not present

## 2015-01-25 DIAGNOSIS — J449 Chronic obstructive pulmonary disease, unspecified: Secondary | ICD-10-CM | POA: Diagnosis not present

## 2015-01-25 DIAGNOSIS — I120 Hypertensive chronic kidney disease with stage 5 chronic kidney disease or end stage renal disease: Secondary | ICD-10-CM | POA: Diagnosis not present

## 2015-01-25 DIAGNOSIS — I739 Peripheral vascular disease, unspecified: Secondary | ICD-10-CM | POA: Diagnosis not present

## 2015-01-25 DIAGNOSIS — A047 Enterocolitis due to Clostridium difficile: Secondary | ICD-10-CM | POA: Diagnosis not present

## 2015-01-25 DIAGNOSIS — I509 Heart failure, unspecified: Secondary | ICD-10-CM | POA: Diagnosis not present

## 2015-01-25 DIAGNOSIS — N186 End stage renal disease: Secondary | ICD-10-CM | POA: Diagnosis not present

## 2015-01-26 DIAGNOSIS — N186 End stage renal disease: Secondary | ICD-10-CM | POA: Diagnosis not present

## 2015-01-26 DIAGNOSIS — B9689 Other specified bacterial agents as the cause of diseases classified elsewhere: Secondary | ICD-10-CM | POA: Diagnosis not present

## 2015-01-26 DIAGNOSIS — D509 Iron deficiency anemia, unspecified: Secondary | ICD-10-CM | POA: Diagnosis not present

## 2015-01-26 DIAGNOSIS — D631 Anemia in chronic kidney disease: Secondary | ICD-10-CM | POA: Diagnosis not present

## 2015-01-26 DIAGNOSIS — R7881 Bacteremia: Secondary | ICD-10-CM | POA: Diagnosis not present

## 2015-01-26 DIAGNOSIS — Z23 Encounter for immunization: Secondary | ICD-10-CM | POA: Diagnosis not present

## 2015-01-29 DIAGNOSIS — Z23 Encounter for immunization: Secondary | ICD-10-CM | POA: Diagnosis not present

## 2015-01-29 DIAGNOSIS — N186 End stage renal disease: Secondary | ICD-10-CM | POA: Diagnosis not present

## 2015-01-29 DIAGNOSIS — B9689 Other specified bacterial agents as the cause of diseases classified elsewhere: Secondary | ICD-10-CM | POA: Diagnosis not present

## 2015-01-29 DIAGNOSIS — R7881 Bacteremia: Secondary | ICD-10-CM | POA: Diagnosis not present

## 2015-01-29 DIAGNOSIS — D631 Anemia in chronic kidney disease: Secondary | ICD-10-CM | POA: Diagnosis not present

## 2015-01-29 DIAGNOSIS — D509 Iron deficiency anemia, unspecified: Secondary | ICD-10-CM | POA: Diagnosis not present

## 2015-01-30 DIAGNOSIS — K51511 Left sided colitis with rectal bleeding: Secondary | ICD-10-CM | POA: Diagnosis not present

## 2015-01-31 DIAGNOSIS — B9689 Other specified bacterial agents as the cause of diseases classified elsewhere: Secondary | ICD-10-CM | POA: Diagnosis not present

## 2015-01-31 DIAGNOSIS — R7881 Bacteremia: Secondary | ICD-10-CM | POA: Diagnosis not present

## 2015-01-31 DIAGNOSIS — D631 Anemia in chronic kidney disease: Secondary | ICD-10-CM | POA: Diagnosis not present

## 2015-01-31 DIAGNOSIS — N186 End stage renal disease: Secondary | ICD-10-CM | POA: Diagnosis not present

## 2015-01-31 DIAGNOSIS — Z23 Encounter for immunization: Secondary | ICD-10-CM | POA: Diagnosis not present

## 2015-01-31 DIAGNOSIS — D509 Iron deficiency anemia, unspecified: Secondary | ICD-10-CM | POA: Diagnosis not present

## 2015-02-01 DIAGNOSIS — A047 Enterocolitis due to Clostridium difficile: Secondary | ICD-10-CM | POA: Diagnosis not present

## 2015-02-01 DIAGNOSIS — N186 End stage renal disease: Secondary | ICD-10-CM | POA: Diagnosis not present

## 2015-02-01 DIAGNOSIS — I739 Peripheral vascular disease, unspecified: Secondary | ICD-10-CM | POA: Diagnosis not present

## 2015-02-01 DIAGNOSIS — I120 Hypertensive chronic kidney disease with stage 5 chronic kidney disease or end stage renal disease: Secondary | ICD-10-CM | POA: Diagnosis not present

## 2015-02-01 DIAGNOSIS — I509 Heart failure, unspecified: Secondary | ICD-10-CM | POA: Diagnosis not present

## 2015-02-01 DIAGNOSIS — J449 Chronic obstructive pulmonary disease, unspecified: Secondary | ICD-10-CM | POA: Diagnosis not present

## 2015-02-02 DIAGNOSIS — R7881 Bacteremia: Secondary | ICD-10-CM | POA: Diagnosis not present

## 2015-02-02 DIAGNOSIS — N186 End stage renal disease: Secondary | ICD-10-CM | POA: Diagnosis not present

## 2015-02-02 DIAGNOSIS — B9689 Other specified bacterial agents as the cause of diseases classified elsewhere: Secondary | ICD-10-CM | POA: Diagnosis not present

## 2015-02-02 DIAGNOSIS — D631 Anemia in chronic kidney disease: Secondary | ICD-10-CM | POA: Diagnosis not present

## 2015-02-02 DIAGNOSIS — D509 Iron deficiency anemia, unspecified: Secondary | ICD-10-CM | POA: Diagnosis not present

## 2015-02-02 DIAGNOSIS — Z23 Encounter for immunization: Secondary | ICD-10-CM | POA: Diagnosis not present

## 2015-02-05 DIAGNOSIS — D509 Iron deficiency anemia, unspecified: Secondary | ICD-10-CM | POA: Diagnosis not present

## 2015-02-05 DIAGNOSIS — D631 Anemia in chronic kidney disease: Secondary | ICD-10-CM | POA: Diagnosis not present

## 2015-02-05 DIAGNOSIS — R7881 Bacteremia: Secondary | ICD-10-CM | POA: Diagnosis not present

## 2015-02-05 DIAGNOSIS — B9689 Other specified bacterial agents as the cause of diseases classified elsewhere: Secondary | ICD-10-CM | POA: Diagnosis not present

## 2015-02-05 DIAGNOSIS — Z23 Encounter for immunization: Secondary | ICD-10-CM | POA: Diagnosis not present

## 2015-02-05 DIAGNOSIS — N186 End stage renal disease: Secondary | ICD-10-CM | POA: Diagnosis not present

## 2015-02-06 DIAGNOSIS — N186 End stage renal disease: Secondary | ICD-10-CM | POA: Diagnosis not present

## 2015-02-06 DIAGNOSIS — I739 Peripheral vascular disease, unspecified: Secondary | ICD-10-CM | POA: Diagnosis not present

## 2015-02-06 DIAGNOSIS — I120 Hypertensive chronic kidney disease with stage 5 chronic kidney disease or end stage renal disease: Secondary | ICD-10-CM | POA: Diagnosis not present

## 2015-02-06 DIAGNOSIS — A047 Enterocolitis due to Clostridium difficile: Secondary | ICD-10-CM | POA: Diagnosis not present

## 2015-02-06 DIAGNOSIS — I509 Heart failure, unspecified: Secondary | ICD-10-CM | POA: Diagnosis not present

## 2015-02-06 DIAGNOSIS — J449 Chronic obstructive pulmonary disease, unspecified: Secondary | ICD-10-CM | POA: Diagnosis not present

## 2015-02-07 DIAGNOSIS — D631 Anemia in chronic kidney disease: Secondary | ICD-10-CM | POA: Diagnosis not present

## 2015-02-07 DIAGNOSIS — N186 End stage renal disease: Secondary | ICD-10-CM | POA: Diagnosis not present

## 2015-02-07 DIAGNOSIS — Z23 Encounter for immunization: Secondary | ICD-10-CM | POA: Diagnosis not present

## 2015-02-07 DIAGNOSIS — R7881 Bacteremia: Secondary | ICD-10-CM | POA: Diagnosis not present

## 2015-02-07 DIAGNOSIS — B9689 Other specified bacterial agents as the cause of diseases classified elsewhere: Secondary | ICD-10-CM | POA: Diagnosis not present

## 2015-02-07 DIAGNOSIS — D509 Iron deficiency anemia, unspecified: Secondary | ICD-10-CM | POA: Diagnosis not present

## 2015-02-08 DIAGNOSIS — A047 Enterocolitis due to Clostridium difficile: Secondary | ICD-10-CM | POA: Diagnosis not present

## 2015-02-08 DIAGNOSIS — J449 Chronic obstructive pulmonary disease, unspecified: Secondary | ICD-10-CM | POA: Diagnosis not present

## 2015-02-08 DIAGNOSIS — I509 Heart failure, unspecified: Secondary | ICD-10-CM | POA: Diagnosis not present

## 2015-02-08 DIAGNOSIS — N186 End stage renal disease: Secondary | ICD-10-CM | POA: Diagnosis not present

## 2015-02-08 DIAGNOSIS — I120 Hypertensive chronic kidney disease with stage 5 chronic kidney disease or end stage renal disease: Secondary | ICD-10-CM | POA: Diagnosis not present

## 2015-02-08 DIAGNOSIS — I739 Peripheral vascular disease, unspecified: Secondary | ICD-10-CM | POA: Diagnosis not present

## 2015-02-09 DIAGNOSIS — D631 Anemia in chronic kidney disease: Secondary | ICD-10-CM | POA: Diagnosis not present

## 2015-02-09 DIAGNOSIS — D509 Iron deficiency anemia, unspecified: Secondary | ICD-10-CM | POA: Diagnosis not present

## 2015-02-09 DIAGNOSIS — Z23 Encounter for immunization: Secondary | ICD-10-CM | POA: Diagnosis not present

## 2015-02-09 DIAGNOSIS — R7881 Bacteremia: Secondary | ICD-10-CM | POA: Diagnosis not present

## 2015-02-09 DIAGNOSIS — B9689 Other specified bacterial agents as the cause of diseases classified elsewhere: Secondary | ICD-10-CM | POA: Diagnosis not present

## 2015-02-09 DIAGNOSIS — N186 End stage renal disease: Secondary | ICD-10-CM | POA: Diagnosis not present

## 2015-02-12 DIAGNOSIS — R7881 Bacteremia: Secondary | ICD-10-CM | POA: Diagnosis not present

## 2015-02-12 DIAGNOSIS — Z23 Encounter for immunization: Secondary | ICD-10-CM | POA: Diagnosis not present

## 2015-02-12 DIAGNOSIS — N186 End stage renal disease: Secondary | ICD-10-CM | POA: Diagnosis not present

## 2015-02-12 DIAGNOSIS — D631 Anemia in chronic kidney disease: Secondary | ICD-10-CM | POA: Diagnosis not present

## 2015-02-12 DIAGNOSIS — D509 Iron deficiency anemia, unspecified: Secondary | ICD-10-CM | POA: Diagnosis not present

## 2015-02-12 DIAGNOSIS — B9689 Other specified bacterial agents as the cause of diseases classified elsewhere: Secondary | ICD-10-CM | POA: Diagnosis not present

## 2015-02-13 DIAGNOSIS — I509 Heart failure, unspecified: Secondary | ICD-10-CM | POA: Diagnosis not present

## 2015-02-13 DIAGNOSIS — A047 Enterocolitis due to Clostridium difficile: Secondary | ICD-10-CM | POA: Diagnosis not present

## 2015-02-13 DIAGNOSIS — N186 End stage renal disease: Secondary | ICD-10-CM | POA: Diagnosis not present

## 2015-02-13 DIAGNOSIS — I120 Hypertensive chronic kidney disease with stage 5 chronic kidney disease or end stage renal disease: Secondary | ICD-10-CM | POA: Diagnosis not present

## 2015-02-13 DIAGNOSIS — J449 Chronic obstructive pulmonary disease, unspecified: Secondary | ICD-10-CM | POA: Diagnosis not present

## 2015-02-13 DIAGNOSIS — I739 Peripheral vascular disease, unspecified: Secondary | ICD-10-CM | POA: Diagnosis not present

## 2015-02-14 DIAGNOSIS — B9689 Other specified bacterial agents as the cause of diseases classified elsewhere: Secondary | ICD-10-CM | POA: Diagnosis not present

## 2015-02-14 DIAGNOSIS — D509 Iron deficiency anemia, unspecified: Secondary | ICD-10-CM | POA: Diagnosis not present

## 2015-02-14 DIAGNOSIS — D631 Anemia in chronic kidney disease: Secondary | ICD-10-CM | POA: Diagnosis not present

## 2015-02-14 DIAGNOSIS — R7881 Bacteremia: Secondary | ICD-10-CM | POA: Diagnosis not present

## 2015-02-14 DIAGNOSIS — Z23 Encounter for immunization: Secondary | ICD-10-CM | POA: Diagnosis not present

## 2015-02-14 DIAGNOSIS — N186 End stage renal disease: Secondary | ICD-10-CM | POA: Diagnosis not present

## 2015-02-15 DIAGNOSIS — I739 Peripheral vascular disease, unspecified: Secondary | ICD-10-CM | POA: Diagnosis not present

## 2015-02-15 DIAGNOSIS — J449 Chronic obstructive pulmonary disease, unspecified: Secondary | ICD-10-CM | POA: Diagnosis not present

## 2015-02-15 DIAGNOSIS — I120 Hypertensive chronic kidney disease with stage 5 chronic kidney disease or end stage renal disease: Secondary | ICD-10-CM | POA: Diagnosis not present

## 2015-02-15 DIAGNOSIS — I509 Heart failure, unspecified: Secondary | ICD-10-CM | POA: Diagnosis not present

## 2015-02-15 DIAGNOSIS — N186 End stage renal disease: Secondary | ICD-10-CM | POA: Diagnosis not present

## 2015-02-15 DIAGNOSIS — A047 Enterocolitis due to Clostridium difficile: Secondary | ICD-10-CM | POA: Diagnosis not present

## 2015-02-16 DIAGNOSIS — B9689 Other specified bacterial agents as the cause of diseases classified elsewhere: Secondary | ICD-10-CM | POA: Diagnosis not present

## 2015-02-16 DIAGNOSIS — Z23 Encounter for immunization: Secondary | ICD-10-CM | POA: Diagnosis not present

## 2015-02-16 DIAGNOSIS — N186 End stage renal disease: Secondary | ICD-10-CM | POA: Diagnosis not present

## 2015-02-16 DIAGNOSIS — D631 Anemia in chronic kidney disease: Secondary | ICD-10-CM | POA: Diagnosis not present

## 2015-02-16 DIAGNOSIS — R7881 Bacteremia: Secondary | ICD-10-CM | POA: Diagnosis not present

## 2015-02-16 DIAGNOSIS — D509 Iron deficiency anemia, unspecified: Secondary | ICD-10-CM | POA: Diagnosis not present

## 2015-02-17 DIAGNOSIS — N186 End stage renal disease: Secondary | ICD-10-CM | POA: Diagnosis not present

## 2015-02-17 DIAGNOSIS — Z992 Dependence on renal dialysis: Secondary | ICD-10-CM | POA: Diagnosis not present

## 2015-02-19 DIAGNOSIS — N186 End stage renal disease: Secondary | ICD-10-CM | POA: Diagnosis not present

## 2015-02-19 DIAGNOSIS — D631 Anemia in chronic kidney disease: Secondary | ICD-10-CM | POA: Diagnosis not present

## 2015-02-19 DIAGNOSIS — R112 Nausea with vomiting, unspecified: Secondary | ICD-10-CM | POA: Diagnosis not present

## 2015-02-19 DIAGNOSIS — Z992 Dependence on renal dialysis: Secondary | ICD-10-CM | POA: Diagnosis not present

## 2015-02-19 DIAGNOSIS — D509 Iron deficiency anemia, unspecified: Secondary | ICD-10-CM | POA: Diagnosis not present

## 2015-02-20 DIAGNOSIS — I739 Peripheral vascular disease, unspecified: Secondary | ICD-10-CM | POA: Diagnosis not present

## 2015-02-20 DIAGNOSIS — N186 End stage renal disease: Secondary | ICD-10-CM | POA: Diagnosis not present

## 2015-02-20 DIAGNOSIS — I509 Heart failure, unspecified: Secondary | ICD-10-CM | POA: Diagnosis not present

## 2015-02-20 DIAGNOSIS — A047 Enterocolitis due to Clostridium difficile: Secondary | ICD-10-CM | POA: Diagnosis not present

## 2015-02-20 DIAGNOSIS — J449 Chronic obstructive pulmonary disease, unspecified: Secondary | ICD-10-CM | POA: Diagnosis not present

## 2015-02-20 DIAGNOSIS — I120 Hypertensive chronic kidney disease with stage 5 chronic kidney disease or end stage renal disease: Secondary | ICD-10-CM | POA: Diagnosis not present

## 2015-02-21 DIAGNOSIS — D509 Iron deficiency anemia, unspecified: Secondary | ICD-10-CM | POA: Diagnosis not present

## 2015-02-21 DIAGNOSIS — D631 Anemia in chronic kidney disease: Secondary | ICD-10-CM | POA: Diagnosis not present

## 2015-02-21 DIAGNOSIS — R112 Nausea with vomiting, unspecified: Secondary | ICD-10-CM | POA: Diagnosis not present

## 2015-02-21 DIAGNOSIS — N186 End stage renal disease: Secondary | ICD-10-CM | POA: Diagnosis not present

## 2015-02-21 DIAGNOSIS — Z992 Dependence on renal dialysis: Secondary | ICD-10-CM | POA: Diagnosis not present

## 2015-02-22 DIAGNOSIS — J449 Chronic obstructive pulmonary disease, unspecified: Secondary | ICD-10-CM | POA: Diagnosis not present

## 2015-02-22 DIAGNOSIS — I120 Hypertensive chronic kidney disease with stage 5 chronic kidney disease or end stage renal disease: Secondary | ICD-10-CM | POA: Diagnosis not present

## 2015-02-22 DIAGNOSIS — I509 Heart failure, unspecified: Secondary | ICD-10-CM | POA: Diagnosis not present

## 2015-02-22 DIAGNOSIS — N186 End stage renal disease: Secondary | ICD-10-CM | POA: Diagnosis not present

## 2015-02-22 DIAGNOSIS — I739 Peripheral vascular disease, unspecified: Secondary | ICD-10-CM | POA: Diagnosis not present

## 2015-02-22 DIAGNOSIS — A047 Enterocolitis due to Clostridium difficile: Secondary | ICD-10-CM | POA: Diagnosis not present

## 2015-02-23 DIAGNOSIS — R112 Nausea with vomiting, unspecified: Secondary | ICD-10-CM | POA: Diagnosis not present

## 2015-02-23 DIAGNOSIS — N186 End stage renal disease: Secondary | ICD-10-CM | POA: Diagnosis not present

## 2015-02-23 DIAGNOSIS — Z992 Dependence on renal dialysis: Secondary | ICD-10-CM | POA: Diagnosis not present

## 2015-02-23 DIAGNOSIS — D509 Iron deficiency anemia, unspecified: Secondary | ICD-10-CM | POA: Diagnosis not present

## 2015-02-23 DIAGNOSIS — D631 Anemia in chronic kidney disease: Secondary | ICD-10-CM | POA: Diagnosis not present

## 2015-02-26 DIAGNOSIS — D631 Anemia in chronic kidney disease: Secondary | ICD-10-CM | POA: Diagnosis not present

## 2015-02-26 DIAGNOSIS — R112 Nausea with vomiting, unspecified: Secondary | ICD-10-CM | POA: Diagnosis not present

## 2015-02-26 DIAGNOSIS — D509 Iron deficiency anemia, unspecified: Secondary | ICD-10-CM | POA: Diagnosis not present

## 2015-02-26 DIAGNOSIS — Z992 Dependence on renal dialysis: Secondary | ICD-10-CM | POA: Diagnosis not present

## 2015-02-26 DIAGNOSIS — N186 End stage renal disease: Secondary | ICD-10-CM | POA: Diagnosis not present

## 2015-02-27 DIAGNOSIS — J449 Chronic obstructive pulmonary disease, unspecified: Secondary | ICD-10-CM | POA: Diagnosis not present

## 2015-02-27 DIAGNOSIS — N186 End stage renal disease: Secondary | ICD-10-CM | POA: Diagnosis not present

## 2015-02-27 DIAGNOSIS — I739 Peripheral vascular disease, unspecified: Secondary | ICD-10-CM | POA: Diagnosis not present

## 2015-02-27 DIAGNOSIS — I120 Hypertensive chronic kidney disease with stage 5 chronic kidney disease or end stage renal disease: Secondary | ICD-10-CM | POA: Diagnosis not present

## 2015-02-27 DIAGNOSIS — A047 Enterocolitis due to Clostridium difficile: Secondary | ICD-10-CM | POA: Diagnosis not present

## 2015-02-27 DIAGNOSIS — I509 Heart failure, unspecified: Secondary | ICD-10-CM | POA: Diagnosis not present

## 2015-02-28 DIAGNOSIS — D631 Anemia in chronic kidney disease: Secondary | ICD-10-CM | POA: Diagnosis not present

## 2015-02-28 DIAGNOSIS — R112 Nausea with vomiting, unspecified: Secondary | ICD-10-CM | POA: Diagnosis not present

## 2015-02-28 DIAGNOSIS — Z992 Dependence on renal dialysis: Secondary | ICD-10-CM | POA: Diagnosis not present

## 2015-02-28 DIAGNOSIS — N186 End stage renal disease: Secondary | ICD-10-CM | POA: Diagnosis not present

## 2015-02-28 DIAGNOSIS — D509 Iron deficiency anemia, unspecified: Secondary | ICD-10-CM | POA: Diagnosis not present

## 2015-03-01 DIAGNOSIS — A047 Enterocolitis due to Clostridium difficile: Secondary | ICD-10-CM | POA: Diagnosis not present

## 2015-03-01 DIAGNOSIS — J449 Chronic obstructive pulmonary disease, unspecified: Secondary | ICD-10-CM | POA: Diagnosis not present

## 2015-03-01 DIAGNOSIS — I739 Peripheral vascular disease, unspecified: Secondary | ICD-10-CM | POA: Diagnosis not present

## 2015-03-01 DIAGNOSIS — I120 Hypertensive chronic kidney disease with stage 5 chronic kidney disease or end stage renal disease: Secondary | ICD-10-CM | POA: Diagnosis not present

## 2015-03-01 DIAGNOSIS — N186 End stage renal disease: Secondary | ICD-10-CM | POA: Diagnosis not present

## 2015-03-01 DIAGNOSIS — I509 Heart failure, unspecified: Secondary | ICD-10-CM | POA: Diagnosis not present

## 2015-03-02 DIAGNOSIS — D509 Iron deficiency anemia, unspecified: Secondary | ICD-10-CM | POA: Diagnosis not present

## 2015-03-02 DIAGNOSIS — Z992 Dependence on renal dialysis: Secondary | ICD-10-CM | POA: Diagnosis not present

## 2015-03-02 DIAGNOSIS — R112 Nausea with vomiting, unspecified: Secondary | ICD-10-CM | POA: Diagnosis not present

## 2015-03-02 DIAGNOSIS — D631 Anemia in chronic kidney disease: Secondary | ICD-10-CM | POA: Diagnosis not present

## 2015-03-02 DIAGNOSIS — N186 End stage renal disease: Secondary | ICD-10-CM | POA: Diagnosis not present

## 2015-03-05 DIAGNOSIS — D631 Anemia in chronic kidney disease: Secondary | ICD-10-CM | POA: Diagnosis not present

## 2015-03-05 DIAGNOSIS — R112 Nausea with vomiting, unspecified: Secondary | ICD-10-CM | POA: Diagnosis not present

## 2015-03-05 DIAGNOSIS — D509 Iron deficiency anemia, unspecified: Secondary | ICD-10-CM | POA: Diagnosis not present

## 2015-03-05 DIAGNOSIS — Z992 Dependence on renal dialysis: Secondary | ICD-10-CM | POA: Diagnosis not present

## 2015-03-05 DIAGNOSIS — N186 End stage renal disease: Secondary | ICD-10-CM | POA: Diagnosis not present

## 2015-03-06 DIAGNOSIS — I739 Peripheral vascular disease, unspecified: Secondary | ICD-10-CM | POA: Diagnosis not present

## 2015-03-06 DIAGNOSIS — A047 Enterocolitis due to Clostridium difficile: Secondary | ICD-10-CM | POA: Diagnosis not present

## 2015-03-06 DIAGNOSIS — N186 End stage renal disease: Secondary | ICD-10-CM | POA: Diagnosis not present

## 2015-03-06 DIAGNOSIS — J449 Chronic obstructive pulmonary disease, unspecified: Secondary | ICD-10-CM | POA: Diagnosis not present

## 2015-03-06 DIAGNOSIS — I120 Hypertensive chronic kidney disease with stage 5 chronic kidney disease or end stage renal disease: Secondary | ICD-10-CM | POA: Diagnosis not present

## 2015-03-06 DIAGNOSIS — I509 Heart failure, unspecified: Secondary | ICD-10-CM | POA: Diagnosis not present

## 2015-03-07 DIAGNOSIS — R112 Nausea with vomiting, unspecified: Secondary | ICD-10-CM | POA: Diagnosis not present

## 2015-03-07 DIAGNOSIS — N186 End stage renal disease: Secondary | ICD-10-CM | POA: Diagnosis not present

## 2015-03-07 DIAGNOSIS — D509 Iron deficiency anemia, unspecified: Secondary | ICD-10-CM | POA: Diagnosis not present

## 2015-03-07 DIAGNOSIS — D631 Anemia in chronic kidney disease: Secondary | ICD-10-CM | POA: Diagnosis not present

## 2015-03-07 DIAGNOSIS — Z992 Dependence on renal dialysis: Secondary | ICD-10-CM | POA: Diagnosis not present

## 2015-03-09 DIAGNOSIS — D631 Anemia in chronic kidney disease: Secondary | ICD-10-CM | POA: Diagnosis not present

## 2015-03-09 DIAGNOSIS — R112 Nausea with vomiting, unspecified: Secondary | ICD-10-CM | POA: Diagnosis not present

## 2015-03-09 DIAGNOSIS — Z992 Dependence on renal dialysis: Secondary | ICD-10-CM | POA: Diagnosis not present

## 2015-03-09 DIAGNOSIS — D509 Iron deficiency anemia, unspecified: Secondary | ICD-10-CM | POA: Diagnosis not present

## 2015-03-09 DIAGNOSIS — N186 End stage renal disease: Secondary | ICD-10-CM | POA: Diagnosis not present

## 2015-03-12 DIAGNOSIS — Z992 Dependence on renal dialysis: Secondary | ICD-10-CM | POA: Diagnosis not present

## 2015-03-12 DIAGNOSIS — D509 Iron deficiency anemia, unspecified: Secondary | ICD-10-CM | POA: Diagnosis not present

## 2015-03-12 DIAGNOSIS — R112 Nausea with vomiting, unspecified: Secondary | ICD-10-CM | POA: Diagnosis not present

## 2015-03-12 DIAGNOSIS — D631 Anemia in chronic kidney disease: Secondary | ICD-10-CM | POA: Diagnosis not present

## 2015-03-12 DIAGNOSIS — N186 End stage renal disease: Secondary | ICD-10-CM | POA: Diagnosis not present

## 2015-03-13 DIAGNOSIS — A047 Enterocolitis due to Clostridium difficile: Secondary | ICD-10-CM | POA: Diagnosis not present

## 2015-03-13 DIAGNOSIS — I739 Peripheral vascular disease, unspecified: Secondary | ICD-10-CM | POA: Diagnosis not present

## 2015-03-13 DIAGNOSIS — N186 End stage renal disease: Secondary | ICD-10-CM | POA: Diagnosis not present

## 2015-03-13 DIAGNOSIS — I120 Hypertensive chronic kidney disease with stage 5 chronic kidney disease or end stage renal disease: Secondary | ICD-10-CM | POA: Diagnosis not present

## 2015-03-13 DIAGNOSIS — J449 Chronic obstructive pulmonary disease, unspecified: Secondary | ICD-10-CM | POA: Diagnosis not present

## 2015-03-13 DIAGNOSIS — I509 Heart failure, unspecified: Secondary | ICD-10-CM | POA: Diagnosis not present

## 2015-03-14 DIAGNOSIS — D509 Iron deficiency anemia, unspecified: Secondary | ICD-10-CM | POA: Diagnosis not present

## 2015-03-14 DIAGNOSIS — Z992 Dependence on renal dialysis: Secondary | ICD-10-CM | POA: Diagnosis not present

## 2015-03-14 DIAGNOSIS — N186 End stage renal disease: Secondary | ICD-10-CM | POA: Diagnosis not present

## 2015-03-14 DIAGNOSIS — R112 Nausea with vomiting, unspecified: Secondary | ICD-10-CM | POA: Diagnosis not present

## 2015-03-14 DIAGNOSIS — D631 Anemia in chronic kidney disease: Secondary | ICD-10-CM | POA: Diagnosis not present

## 2015-03-16 DIAGNOSIS — D631 Anemia in chronic kidney disease: Secondary | ICD-10-CM | POA: Diagnosis not present

## 2015-03-16 DIAGNOSIS — R112 Nausea with vomiting, unspecified: Secondary | ICD-10-CM | POA: Diagnosis not present

## 2015-03-16 DIAGNOSIS — N186 End stage renal disease: Secondary | ICD-10-CM | POA: Diagnosis not present

## 2015-03-16 DIAGNOSIS — Z992 Dependence on renal dialysis: Secondary | ICD-10-CM | POA: Diagnosis not present

## 2015-03-16 DIAGNOSIS — D509 Iron deficiency anemia, unspecified: Secondary | ICD-10-CM | POA: Diagnosis not present

## 2015-03-19 DIAGNOSIS — D631 Anemia in chronic kidney disease: Secondary | ICD-10-CM | POA: Diagnosis not present

## 2015-03-19 DIAGNOSIS — Z992 Dependence on renal dialysis: Secondary | ICD-10-CM | POA: Diagnosis not present

## 2015-03-19 DIAGNOSIS — D509 Iron deficiency anemia, unspecified: Secondary | ICD-10-CM | POA: Diagnosis not present

## 2015-03-19 DIAGNOSIS — N186 End stage renal disease: Secondary | ICD-10-CM | POA: Diagnosis not present

## 2015-03-19 DIAGNOSIS — R112 Nausea with vomiting, unspecified: Secondary | ICD-10-CM | POA: Diagnosis not present

## 2015-03-20 DIAGNOSIS — Z992 Dependence on renal dialysis: Secondary | ICD-10-CM | POA: Diagnosis not present

## 2015-03-20 DIAGNOSIS — N186 End stage renal disease: Secondary | ICD-10-CM | POA: Diagnosis not present

## 2015-03-21 DIAGNOSIS — Z992 Dependence on renal dialysis: Secondary | ICD-10-CM | POA: Diagnosis not present

## 2015-03-21 DIAGNOSIS — D509 Iron deficiency anemia, unspecified: Secondary | ICD-10-CM | POA: Diagnosis not present

## 2015-03-21 DIAGNOSIS — N186 End stage renal disease: Secondary | ICD-10-CM | POA: Diagnosis not present

## 2015-03-21 DIAGNOSIS — D631 Anemia in chronic kidney disease: Secondary | ICD-10-CM | POA: Diagnosis not present

## 2015-03-22 DIAGNOSIS — N186 End stage renal disease: Secondary | ICD-10-CM | POA: Diagnosis not present

## 2015-03-22 DIAGNOSIS — I739 Peripheral vascular disease, unspecified: Secondary | ICD-10-CM | POA: Diagnosis not present

## 2015-03-22 DIAGNOSIS — A047 Enterocolitis due to Clostridium difficile: Secondary | ICD-10-CM | POA: Diagnosis not present

## 2015-03-22 DIAGNOSIS — I120 Hypertensive chronic kidney disease with stage 5 chronic kidney disease or end stage renal disease: Secondary | ICD-10-CM | POA: Diagnosis not present

## 2015-03-22 DIAGNOSIS — J449 Chronic obstructive pulmonary disease, unspecified: Secondary | ICD-10-CM | POA: Diagnosis not present

## 2015-03-22 DIAGNOSIS — I509 Heart failure, unspecified: Secondary | ICD-10-CM | POA: Diagnosis not present

## 2015-03-23 DIAGNOSIS — Z992 Dependence on renal dialysis: Secondary | ICD-10-CM | POA: Diagnosis not present

## 2015-03-23 DIAGNOSIS — D631 Anemia in chronic kidney disease: Secondary | ICD-10-CM | POA: Diagnosis not present

## 2015-03-23 DIAGNOSIS — D509 Iron deficiency anemia, unspecified: Secondary | ICD-10-CM | POA: Diagnosis not present

## 2015-03-23 DIAGNOSIS — N186 End stage renal disease: Secondary | ICD-10-CM | POA: Diagnosis not present

## 2015-03-24 DIAGNOSIS — J449 Chronic obstructive pulmonary disease, unspecified: Secondary | ICD-10-CM | POA: Diagnosis not present

## 2015-03-24 DIAGNOSIS — I120 Hypertensive chronic kidney disease with stage 5 chronic kidney disease or end stage renal disease: Secondary | ICD-10-CM | POA: Diagnosis not present

## 2015-03-24 DIAGNOSIS — I509 Heart failure, unspecified: Secondary | ICD-10-CM | POA: Diagnosis not present

## 2015-03-24 DIAGNOSIS — A047 Enterocolitis due to Clostridium difficile: Secondary | ICD-10-CM | POA: Diagnosis not present

## 2015-03-24 DIAGNOSIS — I739 Peripheral vascular disease, unspecified: Secondary | ICD-10-CM | POA: Diagnosis not present

## 2015-03-24 DIAGNOSIS — N186 End stage renal disease: Secondary | ICD-10-CM | POA: Diagnosis not present

## 2015-03-26 DIAGNOSIS — N186 End stage renal disease: Secondary | ICD-10-CM | POA: Diagnosis not present

## 2015-03-26 DIAGNOSIS — D631 Anemia in chronic kidney disease: Secondary | ICD-10-CM | POA: Diagnosis not present

## 2015-03-26 DIAGNOSIS — Z992 Dependence on renal dialysis: Secondary | ICD-10-CM | POA: Diagnosis not present

## 2015-03-26 DIAGNOSIS — D509 Iron deficiency anemia, unspecified: Secondary | ICD-10-CM | POA: Diagnosis not present

## 2015-03-27 DIAGNOSIS — J449 Chronic obstructive pulmonary disease, unspecified: Secondary | ICD-10-CM | POA: Diagnosis not present

## 2015-03-27 DIAGNOSIS — A047 Enterocolitis due to Clostridium difficile: Secondary | ICD-10-CM | POA: Diagnosis not present

## 2015-03-27 DIAGNOSIS — I509 Heart failure, unspecified: Secondary | ICD-10-CM | POA: Diagnosis not present

## 2015-03-27 DIAGNOSIS — I739 Peripheral vascular disease, unspecified: Secondary | ICD-10-CM | POA: Diagnosis not present

## 2015-03-27 DIAGNOSIS — N186 End stage renal disease: Secondary | ICD-10-CM | POA: Diagnosis not present

## 2015-03-27 DIAGNOSIS — I120 Hypertensive chronic kidney disease with stage 5 chronic kidney disease or end stage renal disease: Secondary | ICD-10-CM | POA: Diagnosis not present

## 2015-03-28 DIAGNOSIS — Z992 Dependence on renal dialysis: Secondary | ICD-10-CM | POA: Diagnosis not present

## 2015-03-28 DIAGNOSIS — D631 Anemia in chronic kidney disease: Secondary | ICD-10-CM | POA: Diagnosis not present

## 2015-03-28 DIAGNOSIS — N186 End stage renal disease: Secondary | ICD-10-CM | POA: Diagnosis not present

## 2015-03-28 DIAGNOSIS — D509 Iron deficiency anemia, unspecified: Secondary | ICD-10-CM | POA: Diagnosis not present

## 2015-03-30 DIAGNOSIS — N186 End stage renal disease: Secondary | ICD-10-CM | POA: Diagnosis not present

## 2015-03-30 DIAGNOSIS — Z992 Dependence on renal dialysis: Secondary | ICD-10-CM | POA: Diagnosis not present

## 2015-03-30 DIAGNOSIS — D509 Iron deficiency anemia, unspecified: Secondary | ICD-10-CM | POA: Diagnosis not present

## 2015-03-30 DIAGNOSIS — D631 Anemia in chronic kidney disease: Secondary | ICD-10-CM | POA: Diagnosis not present

## 2015-04-02 DIAGNOSIS — D631 Anemia in chronic kidney disease: Secondary | ICD-10-CM | POA: Diagnosis not present

## 2015-04-02 DIAGNOSIS — Z992 Dependence on renal dialysis: Secondary | ICD-10-CM | POA: Diagnosis not present

## 2015-04-02 DIAGNOSIS — N186 End stage renal disease: Secondary | ICD-10-CM | POA: Diagnosis not present

## 2015-04-02 DIAGNOSIS — D509 Iron deficiency anemia, unspecified: Secondary | ICD-10-CM | POA: Diagnosis not present

## 2015-04-04 DIAGNOSIS — Z992 Dependence on renal dialysis: Secondary | ICD-10-CM | POA: Diagnosis not present

## 2015-04-04 DIAGNOSIS — D631 Anemia in chronic kidney disease: Secondary | ICD-10-CM | POA: Diagnosis not present

## 2015-04-04 DIAGNOSIS — D509 Iron deficiency anemia, unspecified: Secondary | ICD-10-CM | POA: Diagnosis not present

## 2015-04-04 DIAGNOSIS — N186 End stage renal disease: Secondary | ICD-10-CM | POA: Diagnosis not present

## 2015-04-05 DIAGNOSIS — N186 End stage renal disease: Secondary | ICD-10-CM | POA: Diagnosis not present

## 2015-04-05 DIAGNOSIS — M6281 Muscle weakness (generalized): Secondary | ICD-10-CM | POA: Diagnosis not present

## 2015-04-05 DIAGNOSIS — R269 Unspecified abnormalities of gait and mobility: Secondary | ICD-10-CM | POA: Diagnosis not present

## 2015-04-05 DIAGNOSIS — I509 Heart failure, unspecified: Secondary | ICD-10-CM | POA: Diagnosis not present

## 2015-04-05 DIAGNOSIS — A047 Enterocolitis due to Clostridium difficile: Secondary | ICD-10-CM | POA: Diagnosis not present

## 2015-04-05 DIAGNOSIS — I739 Peripheral vascular disease, unspecified: Secondary | ICD-10-CM | POA: Diagnosis not present

## 2015-04-05 DIAGNOSIS — J449 Chronic obstructive pulmonary disease, unspecified: Secondary | ICD-10-CM | POA: Diagnosis not present

## 2015-04-05 DIAGNOSIS — I120 Hypertensive chronic kidney disease with stage 5 chronic kidney disease or end stage renal disease: Secondary | ICD-10-CM | POA: Diagnosis not present

## 2015-04-05 DIAGNOSIS — R262 Difficulty in walking, not elsewhere classified: Secondary | ICD-10-CM | POA: Diagnosis not present

## 2015-04-06 DIAGNOSIS — Z992 Dependence on renal dialysis: Secondary | ICD-10-CM | POA: Diagnosis not present

## 2015-04-06 DIAGNOSIS — D631 Anemia in chronic kidney disease: Secondary | ICD-10-CM | POA: Diagnosis not present

## 2015-04-06 DIAGNOSIS — N186 End stage renal disease: Secondary | ICD-10-CM | POA: Diagnosis not present

## 2015-04-06 DIAGNOSIS — D509 Iron deficiency anemia, unspecified: Secondary | ICD-10-CM | POA: Diagnosis not present

## 2015-04-09 DIAGNOSIS — D509 Iron deficiency anemia, unspecified: Secondary | ICD-10-CM | POA: Diagnosis not present

## 2015-04-09 DIAGNOSIS — D631 Anemia in chronic kidney disease: Secondary | ICD-10-CM | POA: Diagnosis not present

## 2015-04-09 DIAGNOSIS — N186 End stage renal disease: Secondary | ICD-10-CM | POA: Diagnosis not present

## 2015-04-09 DIAGNOSIS — Z992 Dependence on renal dialysis: Secondary | ICD-10-CM | POA: Diagnosis not present

## 2015-04-10 DIAGNOSIS — M6281 Muscle weakness (generalized): Secondary | ICD-10-CM | POA: Diagnosis not present

## 2015-04-10 DIAGNOSIS — R262 Difficulty in walking, not elsewhere classified: Secondary | ICD-10-CM | POA: Diagnosis not present

## 2015-04-10 DIAGNOSIS — R269 Unspecified abnormalities of gait and mobility: Secondary | ICD-10-CM | POA: Diagnosis not present

## 2015-04-11 DIAGNOSIS — N186 End stage renal disease: Secondary | ICD-10-CM | POA: Diagnosis not present

## 2015-04-11 DIAGNOSIS — Z992 Dependence on renal dialysis: Secondary | ICD-10-CM | POA: Diagnosis not present

## 2015-04-11 DIAGNOSIS — D509 Iron deficiency anemia, unspecified: Secondary | ICD-10-CM | POA: Diagnosis not present

## 2015-04-11 DIAGNOSIS — D631 Anemia in chronic kidney disease: Secondary | ICD-10-CM | POA: Diagnosis not present

## 2015-04-12 DIAGNOSIS — M6281 Muscle weakness (generalized): Secondary | ICD-10-CM | POA: Diagnosis not present

## 2015-04-12 DIAGNOSIS — R269 Unspecified abnormalities of gait and mobility: Secondary | ICD-10-CM | POA: Diagnosis not present

## 2015-04-12 DIAGNOSIS — R262 Difficulty in walking, not elsewhere classified: Secondary | ICD-10-CM | POA: Diagnosis not present

## 2015-04-13 DIAGNOSIS — D509 Iron deficiency anemia, unspecified: Secondary | ICD-10-CM | POA: Diagnosis not present

## 2015-04-13 DIAGNOSIS — N186 End stage renal disease: Secondary | ICD-10-CM | POA: Diagnosis not present

## 2015-04-13 DIAGNOSIS — D631 Anemia in chronic kidney disease: Secondary | ICD-10-CM | POA: Diagnosis not present

## 2015-04-13 DIAGNOSIS — Z992 Dependence on renal dialysis: Secondary | ICD-10-CM | POA: Diagnosis not present

## 2015-04-16 DIAGNOSIS — N186 End stage renal disease: Secondary | ICD-10-CM | POA: Diagnosis not present

## 2015-04-16 DIAGNOSIS — D631 Anemia in chronic kidney disease: Secondary | ICD-10-CM | POA: Diagnosis not present

## 2015-04-16 DIAGNOSIS — D509 Iron deficiency anemia, unspecified: Secondary | ICD-10-CM | POA: Diagnosis not present

## 2015-04-16 DIAGNOSIS — Z992 Dependence on renal dialysis: Secondary | ICD-10-CM | POA: Diagnosis not present

## 2015-04-18 DIAGNOSIS — Z992 Dependence on renal dialysis: Secondary | ICD-10-CM | POA: Diagnosis not present

## 2015-04-18 DIAGNOSIS — N186 End stage renal disease: Secondary | ICD-10-CM | POA: Diagnosis not present

## 2015-04-18 DIAGNOSIS — D631 Anemia in chronic kidney disease: Secondary | ICD-10-CM | POA: Diagnosis not present

## 2015-04-18 DIAGNOSIS — D509 Iron deficiency anemia, unspecified: Secondary | ICD-10-CM | POA: Diagnosis not present

## 2015-04-19 DIAGNOSIS — N186 End stage renal disease: Secondary | ICD-10-CM | POA: Diagnosis not present

## 2015-04-19 DIAGNOSIS — Z992 Dependence on renal dialysis: Secondary | ICD-10-CM | POA: Diagnosis not present

## 2015-04-20 DIAGNOSIS — D631 Anemia in chronic kidney disease: Secondary | ICD-10-CM | POA: Diagnosis not present

## 2015-04-20 DIAGNOSIS — N186 End stage renal disease: Secondary | ICD-10-CM | POA: Diagnosis not present

## 2015-04-20 DIAGNOSIS — D509 Iron deficiency anemia, unspecified: Secondary | ICD-10-CM | POA: Diagnosis not present

## 2015-04-20 DIAGNOSIS — Z992 Dependence on renal dialysis: Secondary | ICD-10-CM | POA: Diagnosis not present

## 2015-04-23 DIAGNOSIS — D509 Iron deficiency anemia, unspecified: Secondary | ICD-10-CM | POA: Diagnosis not present

## 2015-04-23 DIAGNOSIS — N186 End stage renal disease: Secondary | ICD-10-CM | POA: Diagnosis not present

## 2015-04-23 DIAGNOSIS — D631 Anemia in chronic kidney disease: Secondary | ICD-10-CM | POA: Diagnosis not present

## 2015-04-23 DIAGNOSIS — Z992 Dependence on renal dialysis: Secondary | ICD-10-CM | POA: Diagnosis not present

## 2015-04-24 DIAGNOSIS — M6281 Muscle weakness (generalized): Secondary | ICD-10-CM | POA: Diagnosis not present

## 2015-04-24 DIAGNOSIS — R262 Difficulty in walking, not elsewhere classified: Secondary | ICD-10-CM | POA: Diagnosis not present

## 2015-04-24 DIAGNOSIS — R269 Unspecified abnormalities of gait and mobility: Secondary | ICD-10-CM | POA: Diagnosis not present

## 2015-04-25 DIAGNOSIS — D631 Anemia in chronic kidney disease: Secondary | ICD-10-CM | POA: Diagnosis not present

## 2015-04-25 DIAGNOSIS — D509 Iron deficiency anemia, unspecified: Secondary | ICD-10-CM | POA: Diagnosis not present

## 2015-04-25 DIAGNOSIS — Z992 Dependence on renal dialysis: Secondary | ICD-10-CM | POA: Diagnosis not present

## 2015-04-25 DIAGNOSIS — N186 End stage renal disease: Secondary | ICD-10-CM | POA: Diagnosis not present

## 2015-04-27 DIAGNOSIS — Z992 Dependence on renal dialysis: Secondary | ICD-10-CM | POA: Diagnosis not present

## 2015-04-27 DIAGNOSIS — N186 End stage renal disease: Secondary | ICD-10-CM | POA: Diagnosis not present

## 2015-04-27 DIAGNOSIS — D631 Anemia in chronic kidney disease: Secondary | ICD-10-CM | POA: Diagnosis not present

## 2015-04-27 DIAGNOSIS — D509 Iron deficiency anemia, unspecified: Secondary | ICD-10-CM | POA: Diagnosis not present

## 2015-04-30 DIAGNOSIS — N186 End stage renal disease: Secondary | ICD-10-CM | POA: Diagnosis not present

## 2015-04-30 DIAGNOSIS — D509 Iron deficiency anemia, unspecified: Secondary | ICD-10-CM | POA: Diagnosis not present

## 2015-04-30 DIAGNOSIS — D631 Anemia in chronic kidney disease: Secondary | ICD-10-CM | POA: Diagnosis not present

## 2015-04-30 DIAGNOSIS — Z992 Dependence on renal dialysis: Secondary | ICD-10-CM | POA: Diagnosis not present

## 2015-05-01 DIAGNOSIS — R262 Difficulty in walking, not elsewhere classified: Secondary | ICD-10-CM | POA: Diagnosis not present

## 2015-05-01 DIAGNOSIS — M6281 Muscle weakness (generalized): Secondary | ICD-10-CM | POA: Diagnosis not present

## 2015-05-01 DIAGNOSIS — R269 Unspecified abnormalities of gait and mobility: Secondary | ICD-10-CM | POA: Diagnosis not present

## 2015-05-02 DIAGNOSIS — D631 Anemia in chronic kidney disease: Secondary | ICD-10-CM | POA: Diagnosis not present

## 2015-05-02 DIAGNOSIS — D509 Iron deficiency anemia, unspecified: Secondary | ICD-10-CM | POA: Diagnosis not present

## 2015-05-02 DIAGNOSIS — Z992 Dependence on renal dialysis: Secondary | ICD-10-CM | POA: Diagnosis not present

## 2015-05-02 DIAGNOSIS — N186 End stage renal disease: Secondary | ICD-10-CM | POA: Diagnosis not present

## 2015-05-03 DIAGNOSIS — R269 Unspecified abnormalities of gait and mobility: Secondary | ICD-10-CM | POA: Diagnosis not present

## 2015-05-03 DIAGNOSIS — M6281 Muscle weakness (generalized): Secondary | ICD-10-CM | POA: Diagnosis not present

## 2015-05-03 DIAGNOSIS — R262 Difficulty in walking, not elsewhere classified: Secondary | ICD-10-CM | POA: Diagnosis not present

## 2015-05-04 DIAGNOSIS — D509 Iron deficiency anemia, unspecified: Secondary | ICD-10-CM | POA: Diagnosis not present

## 2015-05-04 DIAGNOSIS — D631 Anemia in chronic kidney disease: Secondary | ICD-10-CM | POA: Diagnosis not present

## 2015-05-04 DIAGNOSIS — N186 End stage renal disease: Secondary | ICD-10-CM | POA: Diagnosis not present

## 2015-05-04 DIAGNOSIS — Z992 Dependence on renal dialysis: Secondary | ICD-10-CM | POA: Diagnosis not present

## 2015-05-07 DIAGNOSIS — M545 Low back pain: Secondary | ICD-10-CM | POA: Diagnosis not present

## 2015-05-07 DIAGNOSIS — N186 End stage renal disease: Secondary | ICD-10-CM | POA: Diagnosis not present

## 2015-05-07 DIAGNOSIS — Z992 Dependence on renal dialysis: Secondary | ICD-10-CM | POA: Diagnosis not present

## 2015-05-07 DIAGNOSIS — D509 Iron deficiency anemia, unspecified: Secondary | ICD-10-CM | POA: Diagnosis not present

## 2015-05-07 DIAGNOSIS — I10 Essential (primary) hypertension: Secondary | ICD-10-CM | POA: Diagnosis not present

## 2015-05-07 DIAGNOSIS — D631 Anemia in chronic kidney disease: Secondary | ICD-10-CM | POA: Diagnosis not present

## 2015-05-08 DIAGNOSIS — R269 Unspecified abnormalities of gait and mobility: Secondary | ICD-10-CM | POA: Diagnosis not present

## 2015-05-08 DIAGNOSIS — M6281 Muscle weakness (generalized): Secondary | ICD-10-CM | POA: Diagnosis not present

## 2015-05-08 DIAGNOSIS — R262 Difficulty in walking, not elsewhere classified: Secondary | ICD-10-CM | POA: Diagnosis not present

## 2015-05-09 DIAGNOSIS — D631 Anemia in chronic kidney disease: Secondary | ICD-10-CM | POA: Diagnosis not present

## 2015-05-09 DIAGNOSIS — D509 Iron deficiency anemia, unspecified: Secondary | ICD-10-CM | POA: Diagnosis not present

## 2015-05-09 DIAGNOSIS — Z992 Dependence on renal dialysis: Secondary | ICD-10-CM | POA: Diagnosis not present

## 2015-05-09 DIAGNOSIS — N186 End stage renal disease: Secondary | ICD-10-CM | POA: Diagnosis not present

## 2015-05-10 DIAGNOSIS — M6281 Muscle weakness (generalized): Secondary | ICD-10-CM | POA: Diagnosis not present

## 2015-05-10 DIAGNOSIS — R262 Difficulty in walking, not elsewhere classified: Secondary | ICD-10-CM | POA: Diagnosis not present

## 2015-05-10 DIAGNOSIS — R269 Unspecified abnormalities of gait and mobility: Secondary | ICD-10-CM | POA: Diagnosis not present

## 2015-05-11 DIAGNOSIS — D631 Anemia in chronic kidney disease: Secondary | ICD-10-CM | POA: Diagnosis not present

## 2015-05-11 DIAGNOSIS — D509 Iron deficiency anemia, unspecified: Secondary | ICD-10-CM | POA: Diagnosis not present

## 2015-05-11 DIAGNOSIS — N186 End stage renal disease: Secondary | ICD-10-CM | POA: Diagnosis not present

## 2015-05-11 DIAGNOSIS — Z992 Dependence on renal dialysis: Secondary | ICD-10-CM | POA: Diagnosis not present

## 2015-05-14 DIAGNOSIS — Z992 Dependence on renal dialysis: Secondary | ICD-10-CM | POA: Diagnosis not present

## 2015-05-14 DIAGNOSIS — D631 Anemia in chronic kidney disease: Secondary | ICD-10-CM | POA: Diagnosis not present

## 2015-05-14 DIAGNOSIS — N186 End stage renal disease: Secondary | ICD-10-CM | POA: Diagnosis not present

## 2015-05-14 DIAGNOSIS — D509 Iron deficiency anemia, unspecified: Secondary | ICD-10-CM | POA: Diagnosis not present

## 2015-05-15 DIAGNOSIS — R269 Unspecified abnormalities of gait and mobility: Secondary | ICD-10-CM | POA: Diagnosis not present

## 2015-05-15 DIAGNOSIS — M6281 Muscle weakness (generalized): Secondary | ICD-10-CM | POA: Diagnosis not present

## 2015-05-15 DIAGNOSIS — R262 Difficulty in walking, not elsewhere classified: Secondary | ICD-10-CM | POA: Diagnosis not present

## 2015-05-16 DIAGNOSIS — D509 Iron deficiency anemia, unspecified: Secondary | ICD-10-CM | POA: Diagnosis not present

## 2015-05-16 DIAGNOSIS — D631 Anemia in chronic kidney disease: Secondary | ICD-10-CM | POA: Diagnosis not present

## 2015-05-16 DIAGNOSIS — N186 End stage renal disease: Secondary | ICD-10-CM | POA: Diagnosis not present

## 2015-05-16 DIAGNOSIS — Z992 Dependence on renal dialysis: Secondary | ICD-10-CM | POA: Diagnosis not present

## 2015-05-18 DIAGNOSIS — N186 End stage renal disease: Secondary | ICD-10-CM | POA: Diagnosis not present

## 2015-05-18 DIAGNOSIS — D509 Iron deficiency anemia, unspecified: Secondary | ICD-10-CM | POA: Diagnosis not present

## 2015-05-18 DIAGNOSIS — D631 Anemia in chronic kidney disease: Secondary | ICD-10-CM | POA: Diagnosis not present

## 2015-05-18 DIAGNOSIS — Z992 Dependence on renal dialysis: Secondary | ICD-10-CM | POA: Diagnosis not present

## 2015-05-20 DIAGNOSIS — Z992 Dependence on renal dialysis: Secondary | ICD-10-CM | POA: Diagnosis not present

## 2015-05-20 DIAGNOSIS — N186 End stage renal disease: Secondary | ICD-10-CM | POA: Diagnosis not present

## 2015-05-21 DIAGNOSIS — Z992 Dependence on renal dialysis: Secondary | ICD-10-CM | POA: Diagnosis not present

## 2015-05-21 DIAGNOSIS — D631 Anemia in chronic kidney disease: Secondary | ICD-10-CM | POA: Diagnosis not present

## 2015-05-21 DIAGNOSIS — N186 End stage renal disease: Secondary | ICD-10-CM | POA: Diagnosis not present

## 2015-05-21 DIAGNOSIS — D509 Iron deficiency anemia, unspecified: Secondary | ICD-10-CM | POA: Diagnosis not present

## 2015-05-22 DIAGNOSIS — R269 Unspecified abnormalities of gait and mobility: Secondary | ICD-10-CM | POA: Diagnosis not present

## 2015-05-22 DIAGNOSIS — M6281 Muscle weakness (generalized): Secondary | ICD-10-CM | POA: Diagnosis not present

## 2015-05-22 DIAGNOSIS — R262 Difficulty in walking, not elsewhere classified: Secondary | ICD-10-CM | POA: Diagnosis not present

## 2015-05-23 DIAGNOSIS — N186 End stage renal disease: Secondary | ICD-10-CM | POA: Diagnosis not present

## 2015-05-23 DIAGNOSIS — D631 Anemia in chronic kidney disease: Secondary | ICD-10-CM | POA: Diagnosis not present

## 2015-05-23 DIAGNOSIS — D509 Iron deficiency anemia, unspecified: Secondary | ICD-10-CM | POA: Diagnosis not present

## 2015-05-23 DIAGNOSIS — Z992 Dependence on renal dialysis: Secondary | ICD-10-CM | POA: Diagnosis not present

## 2015-05-24 DIAGNOSIS — R269 Unspecified abnormalities of gait and mobility: Secondary | ICD-10-CM | POA: Diagnosis not present

## 2015-05-24 DIAGNOSIS — R262 Difficulty in walking, not elsewhere classified: Secondary | ICD-10-CM | POA: Diagnosis not present

## 2015-05-24 DIAGNOSIS — M6281 Muscle weakness (generalized): Secondary | ICD-10-CM | POA: Diagnosis not present

## 2015-05-25 DIAGNOSIS — D509 Iron deficiency anemia, unspecified: Secondary | ICD-10-CM | POA: Diagnosis not present

## 2015-05-25 DIAGNOSIS — D631 Anemia in chronic kidney disease: Secondary | ICD-10-CM | POA: Diagnosis not present

## 2015-05-25 DIAGNOSIS — N186 End stage renal disease: Secondary | ICD-10-CM | POA: Diagnosis not present

## 2015-05-25 DIAGNOSIS — Z992 Dependence on renal dialysis: Secondary | ICD-10-CM | POA: Diagnosis not present

## 2015-05-28 DIAGNOSIS — D509 Iron deficiency anemia, unspecified: Secondary | ICD-10-CM | POA: Diagnosis not present

## 2015-05-28 DIAGNOSIS — N186 End stage renal disease: Secondary | ICD-10-CM | POA: Diagnosis not present

## 2015-05-28 DIAGNOSIS — Z992 Dependence on renal dialysis: Secondary | ICD-10-CM | POA: Diagnosis not present

## 2015-05-28 DIAGNOSIS — D631 Anemia in chronic kidney disease: Secondary | ICD-10-CM | POA: Diagnosis not present

## 2015-05-29 DIAGNOSIS — R262 Difficulty in walking, not elsewhere classified: Secondary | ICD-10-CM | POA: Diagnosis not present

## 2015-05-29 DIAGNOSIS — M6281 Muscle weakness (generalized): Secondary | ICD-10-CM | POA: Diagnosis not present

## 2015-05-29 DIAGNOSIS — R269 Unspecified abnormalities of gait and mobility: Secondary | ICD-10-CM | POA: Diagnosis not present

## 2015-05-30 DIAGNOSIS — N186 End stage renal disease: Secondary | ICD-10-CM | POA: Diagnosis not present

## 2015-05-30 DIAGNOSIS — Z992 Dependence on renal dialysis: Secondary | ICD-10-CM | POA: Diagnosis not present

## 2015-05-30 DIAGNOSIS — D509 Iron deficiency anemia, unspecified: Secondary | ICD-10-CM | POA: Diagnosis not present

## 2015-05-30 DIAGNOSIS — D631 Anemia in chronic kidney disease: Secondary | ICD-10-CM | POA: Diagnosis not present

## 2015-06-01 DIAGNOSIS — N186 End stage renal disease: Secondary | ICD-10-CM | POA: Diagnosis not present

## 2015-06-01 DIAGNOSIS — D631 Anemia in chronic kidney disease: Secondary | ICD-10-CM | POA: Diagnosis not present

## 2015-06-01 DIAGNOSIS — D509 Iron deficiency anemia, unspecified: Secondary | ICD-10-CM | POA: Diagnosis not present

## 2015-06-01 DIAGNOSIS — Z992 Dependence on renal dialysis: Secondary | ICD-10-CM | POA: Diagnosis not present

## 2015-06-04 DIAGNOSIS — N186 End stage renal disease: Secondary | ICD-10-CM | POA: Diagnosis not present

## 2015-06-04 DIAGNOSIS — Z992 Dependence on renal dialysis: Secondary | ICD-10-CM | POA: Diagnosis not present

## 2015-06-04 DIAGNOSIS — D509 Iron deficiency anemia, unspecified: Secondary | ICD-10-CM | POA: Diagnosis not present

## 2015-06-04 DIAGNOSIS — D631 Anemia in chronic kidney disease: Secondary | ICD-10-CM | POA: Diagnosis not present

## 2015-06-04 IMAGING — CR DG CHEST 1V PORT
1 series · 1 of 1 positions shown · non-contrast
Comparison: None available

CLINICAL DATA: No clinical history provided

EXAM:
Chest x-ray one view

[ap portable]
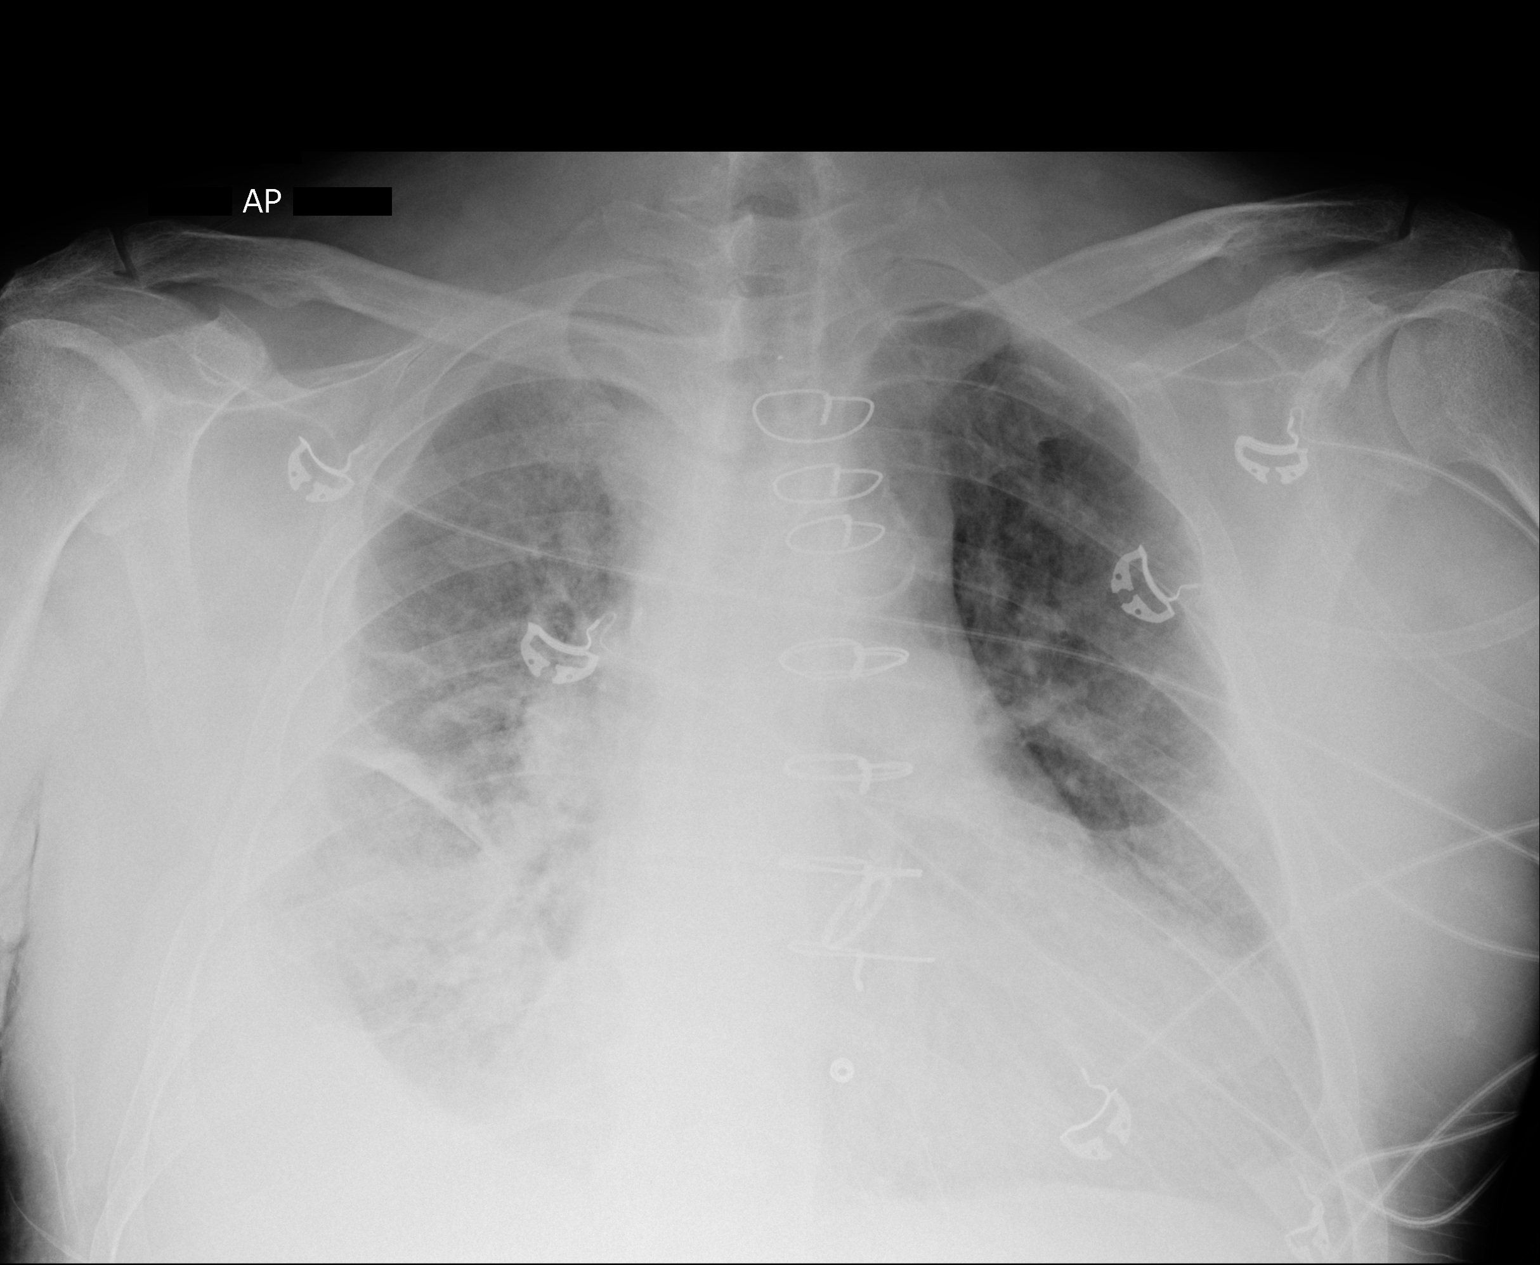

[1 of 1 positions shown; findings below may reference images not displayed]

FINDINGS: The heart size appears enlarged. There are bilateral pleural
effusions and moderate interstitial edema. No airspace consolidation
identified. Atelectasis is noted within the right midlung.
IMPRESSION: Moderate CHF

## 2015-06-05 DIAGNOSIS — M6281 Muscle weakness (generalized): Secondary | ICD-10-CM | POA: Diagnosis not present

## 2015-06-05 DIAGNOSIS — R269 Unspecified abnormalities of gait and mobility: Secondary | ICD-10-CM | POA: Diagnosis not present

## 2015-06-05 DIAGNOSIS — R262 Difficulty in walking, not elsewhere classified: Secondary | ICD-10-CM | POA: Diagnosis not present

## 2015-06-06 DIAGNOSIS — N186 End stage renal disease: Secondary | ICD-10-CM | POA: Diagnosis not present

## 2015-06-06 DIAGNOSIS — D509 Iron deficiency anemia, unspecified: Secondary | ICD-10-CM | POA: Diagnosis not present

## 2015-06-06 DIAGNOSIS — Z992 Dependence on renal dialysis: Secondary | ICD-10-CM | POA: Diagnosis not present

## 2015-06-06 DIAGNOSIS — D631 Anemia in chronic kidney disease: Secondary | ICD-10-CM | POA: Diagnosis not present

## 2015-06-08 DIAGNOSIS — D509 Iron deficiency anemia, unspecified: Secondary | ICD-10-CM | POA: Diagnosis not present

## 2015-06-08 DIAGNOSIS — D631 Anemia in chronic kidney disease: Secondary | ICD-10-CM | POA: Diagnosis not present

## 2015-06-08 DIAGNOSIS — Z992 Dependence on renal dialysis: Secondary | ICD-10-CM | POA: Diagnosis not present

## 2015-06-08 DIAGNOSIS — N186 End stage renal disease: Secondary | ICD-10-CM | POA: Diagnosis not present

## 2015-06-11 DIAGNOSIS — D509 Iron deficiency anemia, unspecified: Secondary | ICD-10-CM | POA: Diagnosis not present

## 2015-06-11 DIAGNOSIS — N186 End stage renal disease: Secondary | ICD-10-CM | POA: Diagnosis not present

## 2015-06-11 DIAGNOSIS — Z992 Dependence on renal dialysis: Secondary | ICD-10-CM | POA: Diagnosis not present

## 2015-06-11 DIAGNOSIS — D631 Anemia in chronic kidney disease: Secondary | ICD-10-CM | POA: Diagnosis not present

## 2015-06-13 DIAGNOSIS — D509 Iron deficiency anemia, unspecified: Secondary | ICD-10-CM | POA: Diagnosis not present

## 2015-06-13 DIAGNOSIS — Z992 Dependence on renal dialysis: Secondary | ICD-10-CM | POA: Diagnosis not present

## 2015-06-13 DIAGNOSIS — D631 Anemia in chronic kidney disease: Secondary | ICD-10-CM | POA: Diagnosis not present

## 2015-06-13 DIAGNOSIS — N186 End stage renal disease: Secondary | ICD-10-CM | POA: Diagnosis not present

## 2015-06-14 DIAGNOSIS — R269 Unspecified abnormalities of gait and mobility: Secondary | ICD-10-CM | POA: Diagnosis not present

## 2015-06-14 DIAGNOSIS — R262 Difficulty in walking, not elsewhere classified: Secondary | ICD-10-CM | POA: Diagnosis not present

## 2015-06-14 DIAGNOSIS — M6281 Muscle weakness (generalized): Secondary | ICD-10-CM | POA: Diagnosis not present

## 2015-06-15 DIAGNOSIS — D631 Anemia in chronic kidney disease: Secondary | ICD-10-CM | POA: Diagnosis not present

## 2015-06-15 DIAGNOSIS — N186 End stage renal disease: Secondary | ICD-10-CM | POA: Diagnosis not present

## 2015-06-15 DIAGNOSIS — Z992 Dependence on renal dialysis: Secondary | ICD-10-CM | POA: Diagnosis not present

## 2015-06-15 DIAGNOSIS — D509 Iron deficiency anemia, unspecified: Secondary | ICD-10-CM | POA: Diagnosis not present

## 2015-06-18 DIAGNOSIS — N186 End stage renal disease: Secondary | ICD-10-CM | POA: Diagnosis not present

## 2015-06-18 DIAGNOSIS — D509 Iron deficiency anemia, unspecified: Secondary | ICD-10-CM | POA: Diagnosis not present

## 2015-06-18 DIAGNOSIS — Z992 Dependence on renal dialysis: Secondary | ICD-10-CM | POA: Diagnosis not present

## 2015-06-18 DIAGNOSIS — D631 Anemia in chronic kidney disease: Secondary | ICD-10-CM | POA: Diagnosis not present

## 2015-06-20 DIAGNOSIS — D631 Anemia in chronic kidney disease: Secondary | ICD-10-CM | POA: Diagnosis not present

## 2015-06-20 DIAGNOSIS — Z992 Dependence on renal dialysis: Secondary | ICD-10-CM | POA: Diagnosis not present

## 2015-06-20 DIAGNOSIS — N186 End stage renal disease: Secondary | ICD-10-CM | POA: Diagnosis not present

## 2015-06-20 DIAGNOSIS — D509 Iron deficiency anemia, unspecified: Secondary | ICD-10-CM | POA: Diagnosis not present

## 2015-06-22 DIAGNOSIS — Z992 Dependence on renal dialysis: Secondary | ICD-10-CM | POA: Diagnosis not present

## 2015-06-22 DIAGNOSIS — T8744 Infection of amputation stump, left lower extremity: Secondary | ICD-10-CM | POA: Diagnosis not present

## 2015-06-22 DIAGNOSIS — D509 Iron deficiency anemia, unspecified: Secondary | ICD-10-CM | POA: Diagnosis not present

## 2015-06-22 DIAGNOSIS — N186 End stage renal disease: Secondary | ICD-10-CM | POA: Diagnosis not present

## 2015-06-22 DIAGNOSIS — Z23 Encounter for immunization: Secondary | ICD-10-CM | POA: Diagnosis not present

## 2015-06-22 DIAGNOSIS — D631 Anemia in chronic kidney disease: Secondary | ICD-10-CM | POA: Diagnosis not present

## 2015-06-25 DIAGNOSIS — Z23 Encounter for immunization: Secondary | ICD-10-CM | POA: Diagnosis not present

## 2015-06-25 DIAGNOSIS — N186 End stage renal disease: Secondary | ICD-10-CM | POA: Diagnosis not present

## 2015-06-25 DIAGNOSIS — T8744 Infection of amputation stump, left lower extremity: Secondary | ICD-10-CM | POA: Diagnosis not present

## 2015-06-25 DIAGNOSIS — Z992 Dependence on renal dialysis: Secondary | ICD-10-CM | POA: Diagnosis not present

## 2015-06-25 DIAGNOSIS — D631 Anemia in chronic kidney disease: Secondary | ICD-10-CM | POA: Diagnosis not present

## 2015-06-25 DIAGNOSIS — D509 Iron deficiency anemia, unspecified: Secondary | ICD-10-CM | POA: Diagnosis not present

## 2015-06-27 DIAGNOSIS — Z23 Encounter for immunization: Secondary | ICD-10-CM | POA: Diagnosis not present

## 2015-06-27 DIAGNOSIS — D509 Iron deficiency anemia, unspecified: Secondary | ICD-10-CM | POA: Diagnosis not present

## 2015-06-27 DIAGNOSIS — T8744 Infection of amputation stump, left lower extremity: Secondary | ICD-10-CM | POA: Diagnosis not present

## 2015-06-27 DIAGNOSIS — D631 Anemia in chronic kidney disease: Secondary | ICD-10-CM | POA: Diagnosis not present

## 2015-06-27 DIAGNOSIS — Z992 Dependence on renal dialysis: Secondary | ICD-10-CM | POA: Diagnosis not present

## 2015-06-27 DIAGNOSIS — N186 End stage renal disease: Secondary | ICD-10-CM | POA: Diagnosis not present

## 2015-06-29 DIAGNOSIS — Z992 Dependence on renal dialysis: Secondary | ICD-10-CM | POA: Diagnosis not present

## 2015-06-29 DIAGNOSIS — D631 Anemia in chronic kidney disease: Secondary | ICD-10-CM | POA: Diagnosis not present

## 2015-06-29 DIAGNOSIS — T8744 Infection of amputation stump, left lower extremity: Secondary | ICD-10-CM | POA: Diagnosis not present

## 2015-06-29 DIAGNOSIS — N186 End stage renal disease: Secondary | ICD-10-CM | POA: Diagnosis not present

## 2015-06-29 DIAGNOSIS — Z23 Encounter for immunization: Secondary | ICD-10-CM | POA: Diagnosis not present

## 2015-06-29 DIAGNOSIS — D509 Iron deficiency anemia, unspecified: Secondary | ICD-10-CM | POA: Diagnosis not present

## 2015-07-02 DIAGNOSIS — N186 End stage renal disease: Secondary | ICD-10-CM | POA: Diagnosis not present

## 2015-07-02 DIAGNOSIS — D631 Anemia in chronic kidney disease: Secondary | ICD-10-CM | POA: Diagnosis not present

## 2015-07-02 DIAGNOSIS — T8744 Infection of amputation stump, left lower extremity: Secondary | ICD-10-CM | POA: Diagnosis not present

## 2015-07-02 DIAGNOSIS — Z23 Encounter for immunization: Secondary | ICD-10-CM | POA: Diagnosis not present

## 2015-07-02 DIAGNOSIS — Z992 Dependence on renal dialysis: Secondary | ICD-10-CM | POA: Diagnosis not present

## 2015-07-02 DIAGNOSIS — D509 Iron deficiency anemia, unspecified: Secondary | ICD-10-CM | POA: Diagnosis not present

## 2015-07-04 DIAGNOSIS — T8744 Infection of amputation stump, left lower extremity: Secondary | ICD-10-CM | POA: Diagnosis not present

## 2015-07-04 DIAGNOSIS — Z23 Encounter for immunization: Secondary | ICD-10-CM | POA: Diagnosis not present

## 2015-07-04 DIAGNOSIS — Z992 Dependence on renal dialysis: Secondary | ICD-10-CM | POA: Diagnosis not present

## 2015-07-04 DIAGNOSIS — D509 Iron deficiency anemia, unspecified: Secondary | ICD-10-CM | POA: Diagnosis not present

## 2015-07-04 DIAGNOSIS — D631 Anemia in chronic kidney disease: Secondary | ICD-10-CM | POA: Diagnosis not present

## 2015-07-04 DIAGNOSIS — N186 End stage renal disease: Secondary | ICD-10-CM | POA: Diagnosis not present

## 2015-07-06 DIAGNOSIS — Z23 Encounter for immunization: Secondary | ICD-10-CM | POA: Diagnosis not present

## 2015-07-06 DIAGNOSIS — D631 Anemia in chronic kidney disease: Secondary | ICD-10-CM | POA: Diagnosis not present

## 2015-07-06 DIAGNOSIS — T8744 Infection of amputation stump, left lower extremity: Secondary | ICD-10-CM | POA: Diagnosis not present

## 2015-07-06 DIAGNOSIS — N186 End stage renal disease: Secondary | ICD-10-CM | POA: Diagnosis not present

## 2015-07-06 DIAGNOSIS — D509 Iron deficiency anemia, unspecified: Secondary | ICD-10-CM | POA: Diagnosis not present

## 2015-07-06 DIAGNOSIS — Z992 Dependence on renal dialysis: Secondary | ICD-10-CM | POA: Diagnosis not present

## 2015-07-09 DIAGNOSIS — Z23 Encounter for immunization: Secondary | ICD-10-CM | POA: Diagnosis not present

## 2015-07-09 DIAGNOSIS — Z992 Dependence on renal dialysis: Secondary | ICD-10-CM | POA: Diagnosis not present

## 2015-07-09 DIAGNOSIS — N186 End stage renal disease: Secondary | ICD-10-CM | POA: Diagnosis not present

## 2015-07-09 DIAGNOSIS — D631 Anemia in chronic kidney disease: Secondary | ICD-10-CM | POA: Diagnosis not present

## 2015-07-09 DIAGNOSIS — T8744 Infection of amputation stump, left lower extremity: Secondary | ICD-10-CM | POA: Diagnosis not present

## 2015-07-09 DIAGNOSIS — D509 Iron deficiency anemia, unspecified: Secondary | ICD-10-CM | POA: Diagnosis not present

## 2015-07-10 DIAGNOSIS — I10 Essential (primary) hypertension: Secondary | ICD-10-CM | POA: Diagnosis not present

## 2015-07-10 DIAGNOSIS — Z1389 Encounter for screening for other disorder: Secondary | ICD-10-CM | POA: Diagnosis not present

## 2015-07-10 DIAGNOSIS — Z0000 Encounter for general adult medical examination without abnormal findings: Secondary | ICD-10-CM | POA: Diagnosis not present

## 2015-07-10 DIAGNOSIS — M545 Low back pain: Secondary | ICD-10-CM | POA: Diagnosis not present

## 2015-07-11 DIAGNOSIS — N186 End stage renal disease: Secondary | ICD-10-CM | POA: Diagnosis not present

## 2015-07-11 DIAGNOSIS — D631 Anemia in chronic kidney disease: Secondary | ICD-10-CM | POA: Diagnosis not present

## 2015-07-11 DIAGNOSIS — Z992 Dependence on renal dialysis: Secondary | ICD-10-CM | POA: Diagnosis not present

## 2015-07-11 DIAGNOSIS — T8744 Infection of amputation stump, left lower extremity: Secondary | ICD-10-CM | POA: Diagnosis not present

## 2015-07-11 DIAGNOSIS — Z23 Encounter for immunization: Secondary | ICD-10-CM | POA: Diagnosis not present

## 2015-07-11 DIAGNOSIS — D509 Iron deficiency anemia, unspecified: Secondary | ICD-10-CM | POA: Diagnosis not present

## 2015-07-13 DIAGNOSIS — N186 End stage renal disease: Secondary | ICD-10-CM | POA: Diagnosis not present

## 2015-07-13 DIAGNOSIS — D631 Anemia in chronic kidney disease: Secondary | ICD-10-CM | POA: Diagnosis not present

## 2015-07-13 DIAGNOSIS — D509 Iron deficiency anemia, unspecified: Secondary | ICD-10-CM | POA: Diagnosis not present

## 2015-07-13 DIAGNOSIS — Z23 Encounter for immunization: Secondary | ICD-10-CM | POA: Diagnosis not present

## 2015-07-13 DIAGNOSIS — T8744 Infection of amputation stump, left lower extremity: Secondary | ICD-10-CM | POA: Diagnosis not present

## 2015-07-13 DIAGNOSIS — Z992 Dependence on renal dialysis: Secondary | ICD-10-CM | POA: Diagnosis not present

## 2015-07-16 DIAGNOSIS — D631 Anemia in chronic kidney disease: Secondary | ICD-10-CM | POA: Diagnosis not present

## 2015-07-16 DIAGNOSIS — N186 End stage renal disease: Secondary | ICD-10-CM | POA: Diagnosis not present

## 2015-07-16 DIAGNOSIS — Z23 Encounter for immunization: Secondary | ICD-10-CM | POA: Diagnosis not present

## 2015-07-16 DIAGNOSIS — T8744 Infection of amputation stump, left lower extremity: Secondary | ICD-10-CM | POA: Diagnosis not present

## 2015-07-16 DIAGNOSIS — D509 Iron deficiency anemia, unspecified: Secondary | ICD-10-CM | POA: Diagnosis not present

## 2015-07-16 DIAGNOSIS — Z992 Dependence on renal dialysis: Secondary | ICD-10-CM | POA: Diagnosis not present

## 2015-07-18 DIAGNOSIS — N186 End stage renal disease: Secondary | ICD-10-CM | POA: Diagnosis present

## 2015-07-18 DIAGNOSIS — I739 Peripheral vascular disease, unspecified: Secondary | ICD-10-CM | POA: Diagnosis present

## 2015-07-18 DIAGNOSIS — Z7901 Long term (current) use of anticoagulants: Secondary | ICD-10-CM | POA: Diagnosis not present

## 2015-07-18 DIAGNOSIS — D689 Coagulation defect, unspecified: Secondary | ICD-10-CM | POA: Diagnosis not present

## 2015-07-18 DIAGNOSIS — Z825 Family history of asthma and other chronic lower respiratory diseases: Secondary | ICD-10-CM | POA: Diagnosis not present

## 2015-07-18 DIAGNOSIS — I959 Hypotension, unspecified: Secondary | ICD-10-CM | POA: Diagnosis not present

## 2015-07-18 DIAGNOSIS — Z992 Dependence on renal dialysis: Secondary | ICD-10-CM | POA: Diagnosis not present

## 2015-07-18 DIAGNOSIS — Z8249 Family history of ischemic heart disease and other diseases of the circulatory system: Secondary | ICD-10-CM | POA: Diagnosis not present

## 2015-07-18 DIAGNOSIS — Z89511 Acquired absence of right leg below knee: Secondary | ICD-10-CM | POA: Diagnosis not present

## 2015-07-18 DIAGNOSIS — D509 Iron deficiency anemia, unspecified: Secondary | ICD-10-CM | POA: Diagnosis not present

## 2015-07-18 DIAGNOSIS — R197 Diarrhea, unspecified: Secondary | ICD-10-CM | POA: Diagnosis not present

## 2015-07-18 DIAGNOSIS — T8744 Infection of amputation stump, left lower extremity: Secondary | ICD-10-CM | POA: Diagnosis not present

## 2015-07-18 DIAGNOSIS — Z885 Allergy status to narcotic agent status: Secondary | ICD-10-CM | POA: Diagnosis not present

## 2015-07-18 DIAGNOSIS — E1129 Type 2 diabetes mellitus with other diabetic kidney complication: Secondary | ICD-10-CM | POA: Diagnosis not present

## 2015-07-18 DIAGNOSIS — I878 Other specified disorders of veins: Secondary | ICD-10-CM | POA: Diagnosis not present

## 2015-07-18 DIAGNOSIS — I10 Essential (primary) hypertension: Secondary | ICD-10-CM | POA: Diagnosis not present

## 2015-07-18 DIAGNOSIS — Z952 Presence of prosthetic heart valve: Secondary | ICD-10-CM | POA: Diagnosis not present

## 2015-07-18 DIAGNOSIS — D631 Anemia in chronic kidney disease: Secondary | ICD-10-CM | POA: Diagnosis not present

## 2015-07-18 DIAGNOSIS — E875 Hyperkalemia: Secondary | ICD-10-CM | POA: Diagnosis present

## 2015-07-18 DIAGNOSIS — J449 Chronic obstructive pulmonary disease, unspecified: Secondary | ICD-10-CM | POA: Diagnosis present

## 2015-07-18 DIAGNOSIS — Z7951 Long term (current) use of inhaled steroids: Secondary | ICD-10-CM | POA: Diagnosis not present

## 2015-07-18 DIAGNOSIS — D649 Anemia, unspecified: Secondary | ICD-10-CM | POA: Diagnosis present

## 2015-07-18 DIAGNOSIS — A047 Enterocolitis due to Clostridium difficile: Secondary | ICD-10-CM | POA: Diagnosis not present

## 2015-07-18 DIAGNOSIS — I5042 Chronic combined systolic (congestive) and diastolic (congestive) heart failure: Secondary | ICD-10-CM | POA: Diagnosis not present

## 2015-07-18 DIAGNOSIS — Z7952 Long term (current) use of systemic steroids: Secondary | ICD-10-CM | POA: Diagnosis not present

## 2015-07-18 DIAGNOSIS — G8929 Other chronic pain: Secondary | ICD-10-CM | POA: Diagnosis present

## 2015-07-18 DIAGNOSIS — L03116 Cellulitis of left lower limb: Secondary | ICD-10-CM | POA: Diagnosis present

## 2015-07-18 DIAGNOSIS — E271 Primary adrenocortical insufficiency: Secondary | ICD-10-CM | POA: Diagnosis present

## 2015-07-18 DIAGNOSIS — Z951 Presence of aortocoronary bypass graft: Secondary | ICD-10-CM | POA: Diagnosis not present

## 2015-07-18 DIAGNOSIS — Z79899 Other long term (current) drug therapy: Secondary | ICD-10-CM | POA: Diagnosis not present

## 2015-07-18 DIAGNOSIS — Z23 Encounter for immunization: Secondary | ICD-10-CM | POA: Diagnosis not present

## 2015-07-18 DIAGNOSIS — M545 Low back pain: Secondary | ICD-10-CM | POA: Diagnosis present

## 2015-07-18 DIAGNOSIS — M898X9 Other specified disorders of bone, unspecified site: Secondary | ICD-10-CM | POA: Diagnosis present

## 2015-07-18 DIAGNOSIS — I132 Hypertensive heart and chronic kidney disease with heart failure and with stage 5 chronic kidney disease, or end stage renal disease: Secondary | ICD-10-CM | POA: Diagnosis present

## 2015-07-18 DIAGNOSIS — R0602 Shortness of breath: Secondary | ICD-10-CM | POA: Diagnosis not present

## 2015-07-20 DIAGNOSIS — Z992 Dependence on renal dialysis: Secondary | ICD-10-CM | POA: Diagnosis not present

## 2015-07-20 DIAGNOSIS — N186 End stage renal disease: Secondary | ICD-10-CM | POA: Diagnosis not present

## 2015-07-25 DIAGNOSIS — E162 Hypoglycemia, unspecified: Secondary | ICD-10-CM | POA: Diagnosis not present

## 2015-07-25 DIAGNOSIS — R112 Nausea with vomiting, unspecified: Secondary | ICD-10-CM | POA: Diagnosis not present

## 2015-07-25 DIAGNOSIS — Z992 Dependence on renal dialysis: Secondary | ICD-10-CM | POA: Diagnosis not present

## 2015-07-25 DIAGNOSIS — N186 End stage renal disease: Secondary | ICD-10-CM | POA: Diagnosis not present

## 2015-07-25 DIAGNOSIS — E11649 Type 2 diabetes mellitus with hypoglycemia without coma: Secondary | ICD-10-CM | POA: Diagnosis not present

## 2015-07-25 DIAGNOSIS — D509 Iron deficiency anemia, unspecified: Secondary | ICD-10-CM | POA: Diagnosis not present

## 2015-07-25 DIAGNOSIS — D631 Anemia in chronic kidney disease: Secondary | ICD-10-CM | POA: Diagnosis not present

## 2015-07-27 DIAGNOSIS — N186 End stage renal disease: Secondary | ICD-10-CM | POA: Diagnosis not present

## 2015-07-27 DIAGNOSIS — Z992 Dependence on renal dialysis: Secondary | ICD-10-CM | POA: Diagnosis not present

## 2015-07-27 DIAGNOSIS — D509 Iron deficiency anemia, unspecified: Secondary | ICD-10-CM | POA: Diagnosis not present

## 2015-07-27 DIAGNOSIS — R112 Nausea with vomiting, unspecified: Secondary | ICD-10-CM | POA: Diagnosis not present

## 2015-07-27 DIAGNOSIS — E162 Hypoglycemia, unspecified: Secondary | ICD-10-CM | POA: Diagnosis not present

## 2015-07-27 DIAGNOSIS — D631 Anemia in chronic kidney disease: Secondary | ICD-10-CM | POA: Diagnosis not present

## 2015-07-30 DIAGNOSIS — Z7901 Long term (current) use of anticoagulants: Secondary | ICD-10-CM | POA: Diagnosis not present

## 2015-07-30 DIAGNOSIS — Z992 Dependence on renal dialysis: Secondary | ICD-10-CM | POA: Diagnosis not present

## 2015-07-30 DIAGNOSIS — R112 Nausea with vomiting, unspecified: Secondary | ICD-10-CM | POA: Diagnosis not present

## 2015-07-30 DIAGNOSIS — D509 Iron deficiency anemia, unspecified: Secondary | ICD-10-CM | POA: Diagnosis not present

## 2015-07-30 DIAGNOSIS — D631 Anemia in chronic kidney disease: Secondary | ICD-10-CM | POA: Diagnosis not present

## 2015-07-30 DIAGNOSIS — N186 End stage renal disease: Secondary | ICD-10-CM | POA: Diagnosis not present

## 2015-07-30 DIAGNOSIS — E162 Hypoglycemia, unspecified: Secondary | ICD-10-CM | POA: Diagnosis not present

## 2015-08-01 DIAGNOSIS — E162 Hypoglycemia, unspecified: Secondary | ICD-10-CM | POA: Diagnosis not present

## 2015-08-01 DIAGNOSIS — R112 Nausea with vomiting, unspecified: Secondary | ICD-10-CM | POA: Diagnosis not present

## 2015-08-01 DIAGNOSIS — N186 End stage renal disease: Secondary | ICD-10-CM | POA: Diagnosis not present

## 2015-08-01 DIAGNOSIS — D509 Iron deficiency anemia, unspecified: Secondary | ICD-10-CM | POA: Diagnosis not present

## 2015-08-01 DIAGNOSIS — Z992 Dependence on renal dialysis: Secondary | ICD-10-CM | POA: Diagnosis not present

## 2015-08-01 DIAGNOSIS — D631 Anemia in chronic kidney disease: Secondary | ICD-10-CM | POA: Diagnosis not present

## 2015-08-03 DIAGNOSIS — N186 End stage renal disease: Secondary | ICD-10-CM | POA: Diagnosis not present

## 2015-08-03 DIAGNOSIS — R112 Nausea with vomiting, unspecified: Secondary | ICD-10-CM | POA: Diagnosis not present

## 2015-08-03 DIAGNOSIS — D631 Anemia in chronic kidney disease: Secondary | ICD-10-CM | POA: Diagnosis not present

## 2015-08-03 DIAGNOSIS — E162 Hypoglycemia, unspecified: Secondary | ICD-10-CM | POA: Diagnosis not present

## 2015-08-03 DIAGNOSIS — Z992 Dependence on renal dialysis: Secondary | ICD-10-CM | POA: Diagnosis not present

## 2015-08-03 DIAGNOSIS — D509 Iron deficiency anemia, unspecified: Secondary | ICD-10-CM | POA: Diagnosis not present

## 2015-08-06 DIAGNOSIS — M545 Low back pain: Secondary | ICD-10-CM | POA: Diagnosis not present

## 2015-08-06 DIAGNOSIS — D509 Iron deficiency anemia, unspecified: Secondary | ICD-10-CM | POA: Diagnosis not present

## 2015-08-06 DIAGNOSIS — N186 End stage renal disease: Secondary | ICD-10-CM | POA: Diagnosis not present

## 2015-08-06 DIAGNOSIS — Z992 Dependence on renal dialysis: Secondary | ICD-10-CM | POA: Diagnosis not present

## 2015-08-06 DIAGNOSIS — E162 Hypoglycemia, unspecified: Secondary | ICD-10-CM | POA: Diagnosis not present

## 2015-08-06 DIAGNOSIS — R112 Nausea with vomiting, unspecified: Secondary | ICD-10-CM | POA: Diagnosis not present

## 2015-08-06 DIAGNOSIS — L03114 Cellulitis of left upper limb: Secondary | ICD-10-CM | POA: Diagnosis not present

## 2015-08-06 DIAGNOSIS — A047 Enterocolitis due to Clostridium difficile: Secondary | ICD-10-CM | POA: Diagnosis not present

## 2015-08-06 DIAGNOSIS — D631 Anemia in chronic kidney disease: Secondary | ICD-10-CM | POA: Diagnosis not present

## 2015-08-08 DIAGNOSIS — R112 Nausea with vomiting, unspecified: Secondary | ICD-10-CM | POA: Diagnosis not present

## 2015-08-08 DIAGNOSIS — D509 Iron deficiency anemia, unspecified: Secondary | ICD-10-CM | POA: Diagnosis not present

## 2015-08-08 DIAGNOSIS — Z992 Dependence on renal dialysis: Secondary | ICD-10-CM | POA: Diagnosis not present

## 2015-08-08 DIAGNOSIS — D631 Anemia in chronic kidney disease: Secondary | ICD-10-CM | POA: Diagnosis not present

## 2015-08-08 DIAGNOSIS — N186 End stage renal disease: Secondary | ICD-10-CM | POA: Diagnosis not present

## 2015-08-08 DIAGNOSIS — E162 Hypoglycemia, unspecified: Secondary | ICD-10-CM | POA: Diagnosis not present

## 2015-08-10 DIAGNOSIS — D631 Anemia in chronic kidney disease: Secondary | ICD-10-CM | POA: Diagnosis not present

## 2015-08-10 DIAGNOSIS — Z792 Long term (current) use of antibiotics: Secondary | ICD-10-CM | POA: Diagnosis not present

## 2015-08-10 DIAGNOSIS — I10 Essential (primary) hypertension: Secondary | ICD-10-CM | POA: Diagnosis not present

## 2015-08-10 DIAGNOSIS — L03115 Cellulitis of right lower limb: Secondary | ICD-10-CM | POA: Diagnosis not present

## 2015-08-10 DIAGNOSIS — Z7952 Long term (current) use of systemic steroids: Secondary | ICD-10-CM | POA: Diagnosis not present

## 2015-08-10 DIAGNOSIS — Z79899 Other long term (current) drug therapy: Secondary | ICD-10-CM | POA: Diagnosis not present

## 2015-08-10 DIAGNOSIS — I120 Hypertensive chronic kidney disease with stage 5 chronic kidney disease or end stage renal disease: Secondary | ICD-10-CM | POA: Diagnosis not present

## 2015-08-10 DIAGNOSIS — F17200 Nicotine dependence, unspecified, uncomplicated: Secondary | ICD-10-CM | POA: Diagnosis not present

## 2015-08-10 DIAGNOSIS — N186 End stage renal disease: Secondary | ICD-10-CM | POA: Diagnosis not present

## 2015-08-10 DIAGNOSIS — R002 Palpitations: Secondary | ICD-10-CM | POA: Diagnosis not present

## 2015-08-10 DIAGNOSIS — R404 Transient alteration of awareness: Secondary | ICD-10-CM | POA: Diagnosis not present

## 2015-08-10 DIAGNOSIS — E875 Hyperkalemia: Secondary | ICD-10-CM | POA: Diagnosis not present

## 2015-08-10 DIAGNOSIS — R531 Weakness: Secondary | ICD-10-CM | POA: Diagnosis not present

## 2015-08-10 DIAGNOSIS — Z992 Dependence on renal dialysis: Secondary | ICD-10-CM | POA: Diagnosis not present

## 2015-08-10 DIAGNOSIS — M545 Low back pain: Secondary | ICD-10-CM | POA: Diagnosis not present

## 2015-08-10 DIAGNOSIS — Z885 Allergy status to narcotic agent status: Secondary | ICD-10-CM | POA: Diagnosis not present

## 2015-08-10 DIAGNOSIS — R748 Abnormal levels of other serum enzymes: Secondary | ICD-10-CM | POA: Diagnosis not present

## 2015-08-10 DIAGNOSIS — G8929 Other chronic pain: Secondary | ICD-10-CM | POA: Diagnosis not present

## 2015-08-10 DIAGNOSIS — R55 Syncope and collapse: Secondary | ICD-10-CM | POA: Diagnosis not present

## 2015-08-10 DIAGNOSIS — I5042 Chronic combined systolic (congestive) and diastolic (congestive) heart failure: Secondary | ICD-10-CM | POA: Diagnosis not present

## 2015-08-10 DIAGNOSIS — I739 Peripheral vascular disease, unspecified: Secondary | ICD-10-CM | POA: Diagnosis not present

## 2015-08-10 DIAGNOSIS — Z89512 Acquired absence of left leg below knee: Secondary | ICD-10-CM | POA: Diagnosis not present

## 2015-08-10 DIAGNOSIS — J449 Chronic obstructive pulmonary disease, unspecified: Secondary | ICD-10-CM | POA: Diagnosis not present

## 2015-08-10 DIAGNOSIS — Z952 Presence of prosthetic heart valve: Secondary | ICD-10-CM | POA: Diagnosis not present

## 2015-08-10 DIAGNOSIS — R079 Chest pain, unspecified: Secondary | ICD-10-CM | POA: Diagnosis not present

## 2015-08-10 DIAGNOSIS — E271 Primary adrenocortical insufficiency: Secondary | ICD-10-CM | POA: Diagnosis not present

## 2015-08-10 DIAGNOSIS — Z89511 Acquired absence of right leg below knee: Secondary | ICD-10-CM | POA: Diagnosis not present

## 2015-08-11 DIAGNOSIS — D631 Anemia in chronic kidney disease: Secondary | ICD-10-CM | POA: Diagnosis not present

## 2015-08-11 DIAGNOSIS — Z992 Dependence on renal dialysis: Secondary | ICD-10-CM | POA: Diagnosis not present

## 2015-08-11 DIAGNOSIS — I5042 Chronic combined systolic (congestive) and diastolic (congestive) heart failure: Secondary | ICD-10-CM | POA: Diagnosis not present

## 2015-08-11 DIAGNOSIS — I120 Hypertensive chronic kidney disease with stage 5 chronic kidney disease or end stage renal disease: Secondary | ICD-10-CM | POA: Diagnosis not present

## 2015-08-11 DIAGNOSIS — N186 End stage renal disease: Secondary | ICD-10-CM | POA: Diagnosis not present

## 2015-08-11 DIAGNOSIS — E875 Hyperkalemia: Secondary | ICD-10-CM | POA: Diagnosis not present

## 2015-08-11 DIAGNOSIS — R55 Syncope and collapse: Secondary | ICD-10-CM | POA: Diagnosis not present

## 2015-08-11 DIAGNOSIS — R748 Abnormal levels of other serum enzymes: Secondary | ICD-10-CM | POA: Diagnosis not present

## 2015-08-11 DIAGNOSIS — I10 Essential (primary) hypertension: Secondary | ICD-10-CM | POA: Diagnosis not present

## 2015-08-11 DIAGNOSIS — L03115 Cellulitis of right lower limb: Secondary | ICD-10-CM | POA: Diagnosis not present

## 2015-08-14 DIAGNOSIS — Z7901 Long term (current) use of anticoagulants: Secondary | ICD-10-CM | POA: Diagnosis not present

## 2015-08-15 DIAGNOSIS — E119 Type 2 diabetes mellitus without complications: Secondary | ICD-10-CM | POA: Diagnosis not present

## 2015-08-15 DIAGNOSIS — D631 Anemia in chronic kidney disease: Secondary | ICD-10-CM | POA: Diagnosis not present

## 2015-08-15 DIAGNOSIS — R112 Nausea with vomiting, unspecified: Secondary | ICD-10-CM | POA: Diagnosis not present

## 2015-08-15 DIAGNOSIS — D509 Iron deficiency anemia, unspecified: Secondary | ICD-10-CM | POA: Diagnosis not present

## 2015-08-15 DIAGNOSIS — N186 End stage renal disease: Secondary | ICD-10-CM | POA: Diagnosis not present

## 2015-08-15 DIAGNOSIS — E162 Hypoglycemia, unspecified: Secondary | ICD-10-CM | POA: Diagnosis not present

## 2015-08-15 DIAGNOSIS — Z992 Dependence on renal dialysis: Secondary | ICD-10-CM | POA: Diagnosis not present

## 2015-08-16 DIAGNOSIS — Z952 Presence of prosthetic heart valve: Secondary | ICD-10-CM | POA: Diagnosis not present

## 2015-08-16 DIAGNOSIS — Z8489 Family history of other specified conditions: Secondary | ICD-10-CM | POA: Diagnosis not present

## 2015-08-16 DIAGNOSIS — Z905 Acquired absence of kidney: Secondary | ICD-10-CM | POA: Diagnosis not present

## 2015-08-16 DIAGNOSIS — Z452 Encounter for adjustment and management of vascular access device: Secondary | ICD-10-CM | POA: Diagnosis not present

## 2015-08-16 DIAGNOSIS — Z992 Dependence on renal dialysis: Secondary | ICD-10-CM | POA: Diagnosis not present

## 2015-08-16 DIAGNOSIS — Z79899 Other long term (current) drug therapy: Secondary | ICD-10-CM | POA: Diagnosis not present

## 2015-08-16 DIAGNOSIS — Z808 Family history of malignant neoplasm of other organs or systems: Secondary | ICD-10-CM | POA: Diagnosis not present

## 2015-08-16 DIAGNOSIS — I739 Peripheral vascular disease, unspecified: Secondary | ICD-10-CM | POA: Diagnosis not present

## 2015-08-16 DIAGNOSIS — L03116 Cellulitis of left lower limb: Secondary | ICD-10-CM | POA: Diagnosis not present

## 2015-08-16 DIAGNOSIS — Z89512 Acquired absence of left leg below knee: Secondary | ICD-10-CM | POA: Diagnosis not present

## 2015-08-16 DIAGNOSIS — I120 Hypertensive chronic kidney disease with stage 5 chronic kidney disease or end stage renal disease: Secondary | ICD-10-CM | POA: Diagnosis not present

## 2015-08-16 DIAGNOSIS — Z836 Family history of other diseases of the respiratory system: Secondary | ICD-10-CM | POA: Diagnosis not present

## 2015-08-16 DIAGNOSIS — T8744 Infection of amputation stump, left lower extremity: Secondary | ICD-10-CM | POA: Diagnosis not present

## 2015-08-16 DIAGNOSIS — E271 Primary adrenocortical insufficiency: Secondary | ICD-10-CM | POA: Diagnosis not present

## 2015-08-16 DIAGNOSIS — I878 Other specified disorders of veins: Secondary | ICD-10-CM | POA: Diagnosis not present

## 2015-08-16 DIAGNOSIS — Z8249 Family history of ischemic heart disease and other diseases of the circulatory system: Secondary | ICD-10-CM | POA: Diagnosis not present

## 2015-08-16 DIAGNOSIS — I5040 Unspecified combined systolic (congestive) and diastolic (congestive) heart failure: Secondary | ICD-10-CM | POA: Diagnosis not present

## 2015-08-16 DIAGNOSIS — N186 End stage renal disease: Secondary | ICD-10-CM | POA: Diagnosis not present

## 2015-08-16 DIAGNOSIS — J449 Chronic obstructive pulmonary disease, unspecified: Secondary | ICD-10-CM | POA: Diagnosis not present

## 2015-08-16 DIAGNOSIS — F17200 Nicotine dependence, unspecified, uncomplicated: Secondary | ICD-10-CM | POA: Diagnosis not present

## 2015-08-16 DIAGNOSIS — Z89511 Acquired absence of right leg below knee: Secondary | ICD-10-CM | POA: Diagnosis not present

## 2015-08-16 DIAGNOSIS — Z7902 Long term (current) use of antithrombotics/antiplatelets: Secondary | ICD-10-CM | POA: Diagnosis not present

## 2015-08-16 DIAGNOSIS — Z885 Allergy status to narcotic agent status: Secondary | ICD-10-CM | POA: Diagnosis not present

## 2015-08-17 DIAGNOSIS — D631 Anemia in chronic kidney disease: Secondary | ICD-10-CM | POA: Diagnosis not present

## 2015-08-17 DIAGNOSIS — D509 Iron deficiency anemia, unspecified: Secondary | ICD-10-CM | POA: Diagnosis not present

## 2015-08-17 DIAGNOSIS — N186 End stage renal disease: Secondary | ICD-10-CM | POA: Diagnosis not present

## 2015-08-17 DIAGNOSIS — R112 Nausea with vomiting, unspecified: Secondary | ICD-10-CM | POA: Diagnosis not present

## 2015-08-17 DIAGNOSIS — E162 Hypoglycemia, unspecified: Secondary | ICD-10-CM | POA: Diagnosis not present

## 2015-08-17 DIAGNOSIS — Z992 Dependence on renal dialysis: Secondary | ICD-10-CM | POA: Diagnosis not present

## 2015-08-20 DIAGNOSIS — D509 Iron deficiency anemia, unspecified: Secondary | ICD-10-CM | POA: Diagnosis not present

## 2015-08-20 DIAGNOSIS — N186 End stage renal disease: Secondary | ICD-10-CM | POA: Diagnosis not present

## 2015-08-20 DIAGNOSIS — D631 Anemia in chronic kidney disease: Secondary | ICD-10-CM | POA: Diagnosis not present

## 2015-08-20 DIAGNOSIS — E162 Hypoglycemia, unspecified: Secondary | ICD-10-CM | POA: Diagnosis not present

## 2015-08-20 DIAGNOSIS — R112 Nausea with vomiting, unspecified: Secondary | ICD-10-CM | POA: Diagnosis not present

## 2015-08-20 DIAGNOSIS — Z992 Dependence on renal dialysis: Secondary | ICD-10-CM | POA: Diagnosis not present

## 2015-08-22 DIAGNOSIS — D631 Anemia in chronic kidney disease: Secondary | ICD-10-CM | POA: Diagnosis not present

## 2015-08-22 DIAGNOSIS — N186 End stage renal disease: Secondary | ICD-10-CM | POA: Diagnosis not present

## 2015-08-22 DIAGNOSIS — Z992 Dependence on renal dialysis: Secondary | ICD-10-CM | POA: Diagnosis not present

## 2015-08-22 DIAGNOSIS — D509 Iron deficiency anemia, unspecified: Secondary | ICD-10-CM | POA: Diagnosis not present

## 2015-08-23 DIAGNOSIS — Z7901 Long term (current) use of anticoagulants: Secondary | ICD-10-CM | POA: Diagnosis not present

## 2015-08-24 DIAGNOSIS — Z992 Dependence on renal dialysis: Secondary | ICD-10-CM | POA: Diagnosis not present

## 2015-08-24 DIAGNOSIS — D631 Anemia in chronic kidney disease: Secondary | ICD-10-CM | POA: Diagnosis not present

## 2015-08-24 DIAGNOSIS — N186 End stage renal disease: Secondary | ICD-10-CM | POA: Diagnosis not present

## 2015-08-24 DIAGNOSIS — D509 Iron deficiency anemia, unspecified: Secondary | ICD-10-CM | POA: Diagnosis not present

## 2015-08-27 DIAGNOSIS — N186 End stage renal disease: Secondary | ICD-10-CM | POA: Diagnosis not present

## 2015-08-27 DIAGNOSIS — D631 Anemia in chronic kidney disease: Secondary | ICD-10-CM | POA: Diagnosis not present

## 2015-08-27 DIAGNOSIS — Z992 Dependence on renal dialysis: Secondary | ICD-10-CM | POA: Diagnosis not present

## 2015-08-27 DIAGNOSIS — D509 Iron deficiency anemia, unspecified: Secondary | ICD-10-CM | POA: Diagnosis not present

## 2015-08-29 DIAGNOSIS — Z992 Dependence on renal dialysis: Secondary | ICD-10-CM | POA: Diagnosis not present

## 2015-08-29 DIAGNOSIS — D509 Iron deficiency anemia, unspecified: Secondary | ICD-10-CM | POA: Diagnosis not present

## 2015-08-29 DIAGNOSIS — N186 End stage renal disease: Secondary | ICD-10-CM | POA: Diagnosis not present

## 2015-08-29 DIAGNOSIS — D631 Anemia in chronic kidney disease: Secondary | ICD-10-CM | POA: Diagnosis not present

## 2015-08-31 DIAGNOSIS — D509 Iron deficiency anemia, unspecified: Secondary | ICD-10-CM | POA: Diagnosis not present

## 2015-08-31 DIAGNOSIS — D631 Anemia in chronic kidney disease: Secondary | ICD-10-CM | POA: Diagnosis not present

## 2015-08-31 DIAGNOSIS — N186 End stage renal disease: Secondary | ICD-10-CM | POA: Diagnosis not present

## 2015-08-31 DIAGNOSIS — Z992 Dependence on renal dialysis: Secondary | ICD-10-CM | POA: Diagnosis not present

## 2015-09-03 DIAGNOSIS — N186 End stage renal disease: Secondary | ICD-10-CM | POA: Diagnosis not present

## 2015-09-03 DIAGNOSIS — Z992 Dependence on renal dialysis: Secondary | ICD-10-CM | POA: Diagnosis not present

## 2015-09-03 DIAGNOSIS — D631 Anemia in chronic kidney disease: Secondary | ICD-10-CM | POA: Diagnosis not present

## 2015-09-03 DIAGNOSIS — D509 Iron deficiency anemia, unspecified: Secondary | ICD-10-CM | POA: Diagnosis not present

## 2015-09-05 DIAGNOSIS — Z992 Dependence on renal dialysis: Secondary | ICD-10-CM | POA: Diagnosis not present

## 2015-09-05 DIAGNOSIS — N186 End stage renal disease: Secondary | ICD-10-CM | POA: Diagnosis not present

## 2015-09-05 DIAGNOSIS — D631 Anemia in chronic kidney disease: Secondary | ICD-10-CM | POA: Diagnosis not present

## 2015-09-05 DIAGNOSIS — D509 Iron deficiency anemia, unspecified: Secondary | ICD-10-CM | POA: Diagnosis not present

## 2015-09-06 DIAGNOSIS — Z452 Encounter for adjustment and management of vascular access device: Secondary | ICD-10-CM | POA: Diagnosis not present

## 2015-09-07 DIAGNOSIS — D631 Anemia in chronic kidney disease: Secondary | ICD-10-CM | POA: Diagnosis not present

## 2015-09-07 DIAGNOSIS — N186 End stage renal disease: Secondary | ICD-10-CM | POA: Diagnosis not present

## 2015-09-07 DIAGNOSIS — D509 Iron deficiency anemia, unspecified: Secondary | ICD-10-CM | POA: Diagnosis not present

## 2015-09-07 DIAGNOSIS — Z992 Dependence on renal dialysis: Secondary | ICD-10-CM | POA: Diagnosis not present

## 2015-09-10 DIAGNOSIS — D631 Anemia in chronic kidney disease: Secondary | ICD-10-CM | POA: Diagnosis not present

## 2015-09-10 DIAGNOSIS — N186 End stage renal disease: Secondary | ICD-10-CM | POA: Diagnosis not present

## 2015-09-10 DIAGNOSIS — D509 Iron deficiency anemia, unspecified: Secondary | ICD-10-CM | POA: Diagnosis not present

## 2015-09-10 DIAGNOSIS — Z992 Dependence on renal dialysis: Secondary | ICD-10-CM | POA: Diagnosis not present

## 2015-09-12 DIAGNOSIS — D509 Iron deficiency anemia, unspecified: Secondary | ICD-10-CM | POA: Diagnosis not present

## 2015-09-12 DIAGNOSIS — D631 Anemia in chronic kidney disease: Secondary | ICD-10-CM | POA: Diagnosis not present

## 2015-09-12 DIAGNOSIS — Z992 Dependence on renal dialysis: Secondary | ICD-10-CM | POA: Diagnosis not present

## 2015-09-12 DIAGNOSIS — N186 End stage renal disease: Secondary | ICD-10-CM | POA: Diagnosis not present

## 2015-09-14 DIAGNOSIS — Z992 Dependence on renal dialysis: Secondary | ICD-10-CM | POA: Diagnosis not present

## 2015-09-14 DIAGNOSIS — D631 Anemia in chronic kidney disease: Secondary | ICD-10-CM | POA: Diagnosis not present

## 2015-09-14 DIAGNOSIS — N186 End stage renal disease: Secondary | ICD-10-CM | POA: Diagnosis not present

## 2015-09-14 DIAGNOSIS — D509 Iron deficiency anemia, unspecified: Secondary | ICD-10-CM | POA: Diagnosis not present

## 2015-09-17 DIAGNOSIS — N186 End stage renal disease: Secondary | ICD-10-CM | POA: Diagnosis not present

## 2015-09-17 DIAGNOSIS — D631 Anemia in chronic kidney disease: Secondary | ICD-10-CM | POA: Diagnosis not present

## 2015-09-17 DIAGNOSIS — D509 Iron deficiency anemia, unspecified: Secondary | ICD-10-CM | POA: Diagnosis not present

## 2015-09-17 DIAGNOSIS — Z992 Dependence on renal dialysis: Secondary | ICD-10-CM | POA: Diagnosis not present

## 2015-09-19 DIAGNOSIS — N186 End stage renal disease: Secondary | ICD-10-CM | POA: Diagnosis not present

## 2015-09-19 DIAGNOSIS — D509 Iron deficiency anemia, unspecified: Secondary | ICD-10-CM | POA: Diagnosis not present

## 2015-09-19 DIAGNOSIS — D631 Anemia in chronic kidney disease: Secondary | ICD-10-CM | POA: Diagnosis not present

## 2015-09-19 DIAGNOSIS — Z992 Dependence on renal dialysis: Secondary | ICD-10-CM | POA: Diagnosis not present

## 2015-09-21 DIAGNOSIS — D631 Anemia in chronic kidney disease: Secondary | ICD-10-CM | POA: Diagnosis not present

## 2015-09-21 DIAGNOSIS — N186 End stage renal disease: Secondary | ICD-10-CM | POA: Diagnosis not present

## 2015-09-21 DIAGNOSIS — Z992 Dependence on renal dialysis: Secondary | ICD-10-CM | POA: Diagnosis not present

## 2015-09-21 DIAGNOSIS — D509 Iron deficiency anemia, unspecified: Secondary | ICD-10-CM | POA: Diagnosis not present

## 2015-09-24 DIAGNOSIS — D631 Anemia in chronic kidney disease: Secondary | ICD-10-CM | POA: Diagnosis not present

## 2015-09-24 DIAGNOSIS — Z992 Dependence on renal dialysis: Secondary | ICD-10-CM | POA: Diagnosis not present

## 2015-09-24 DIAGNOSIS — N186 End stage renal disease: Secondary | ICD-10-CM | POA: Diagnosis not present

## 2015-09-24 DIAGNOSIS — D509 Iron deficiency anemia, unspecified: Secondary | ICD-10-CM | POA: Diagnosis not present

## 2015-09-26 DIAGNOSIS — N186 End stage renal disease: Secondary | ICD-10-CM | POA: Diagnosis not present

## 2015-09-26 DIAGNOSIS — D631 Anemia in chronic kidney disease: Secondary | ICD-10-CM | POA: Diagnosis not present

## 2015-09-26 DIAGNOSIS — D509 Iron deficiency anemia, unspecified: Secondary | ICD-10-CM | POA: Diagnosis not present

## 2015-09-26 DIAGNOSIS — Z992 Dependence on renal dialysis: Secondary | ICD-10-CM | POA: Diagnosis not present

## 2015-09-28 DIAGNOSIS — N186 End stage renal disease: Secondary | ICD-10-CM | POA: Diagnosis not present

## 2015-09-28 DIAGNOSIS — D509 Iron deficiency anemia, unspecified: Secondary | ICD-10-CM | POA: Diagnosis not present

## 2015-09-28 DIAGNOSIS — Z992 Dependence on renal dialysis: Secondary | ICD-10-CM | POA: Diagnosis not present

## 2015-09-28 DIAGNOSIS — D631 Anemia in chronic kidney disease: Secondary | ICD-10-CM | POA: Diagnosis not present

## 2015-10-01 DIAGNOSIS — D631 Anemia in chronic kidney disease: Secondary | ICD-10-CM | POA: Diagnosis not present

## 2015-10-01 DIAGNOSIS — Z992 Dependence on renal dialysis: Secondary | ICD-10-CM | POA: Diagnosis not present

## 2015-10-01 DIAGNOSIS — N186 End stage renal disease: Secondary | ICD-10-CM | POA: Diagnosis not present

## 2015-10-01 DIAGNOSIS — D509 Iron deficiency anemia, unspecified: Secondary | ICD-10-CM | POA: Diagnosis not present

## 2015-10-03 DIAGNOSIS — D509 Iron deficiency anemia, unspecified: Secondary | ICD-10-CM | POA: Diagnosis not present

## 2015-10-03 DIAGNOSIS — D631 Anemia in chronic kidney disease: Secondary | ICD-10-CM | POA: Diagnosis not present

## 2015-10-03 DIAGNOSIS — Z992 Dependence on renal dialysis: Secondary | ICD-10-CM | POA: Diagnosis not present

## 2015-10-03 DIAGNOSIS — N186 End stage renal disease: Secondary | ICD-10-CM | POA: Diagnosis not present

## 2015-10-05 DIAGNOSIS — N186 End stage renal disease: Secondary | ICD-10-CM | POA: Diagnosis not present

## 2015-10-05 DIAGNOSIS — Z992 Dependence on renal dialysis: Secondary | ICD-10-CM | POA: Diagnosis not present

## 2015-10-05 DIAGNOSIS — D509 Iron deficiency anemia, unspecified: Secondary | ICD-10-CM | POA: Diagnosis not present

## 2015-10-05 DIAGNOSIS — D631 Anemia in chronic kidney disease: Secondary | ICD-10-CM | POA: Diagnosis not present

## 2015-10-08 DIAGNOSIS — D631 Anemia in chronic kidney disease: Secondary | ICD-10-CM | POA: Diagnosis not present

## 2015-10-08 DIAGNOSIS — N186 End stage renal disease: Secondary | ICD-10-CM | POA: Diagnosis not present

## 2015-10-08 DIAGNOSIS — D509 Iron deficiency anemia, unspecified: Secondary | ICD-10-CM | POA: Diagnosis not present

## 2015-10-08 DIAGNOSIS — Z992 Dependence on renal dialysis: Secondary | ICD-10-CM | POA: Diagnosis not present

## 2015-10-09 DIAGNOSIS — M545 Low back pain: Secondary | ICD-10-CM | POA: Diagnosis not present

## 2015-10-09 DIAGNOSIS — Z452 Encounter for adjustment and management of vascular access device: Secondary | ICD-10-CM | POA: Diagnosis not present

## 2015-10-10 DIAGNOSIS — Z992 Dependence on renal dialysis: Secondary | ICD-10-CM | POA: Diagnosis not present

## 2015-10-10 DIAGNOSIS — D509 Iron deficiency anemia, unspecified: Secondary | ICD-10-CM | POA: Diagnosis not present

## 2015-10-10 DIAGNOSIS — N186 End stage renal disease: Secondary | ICD-10-CM | POA: Diagnosis not present

## 2015-10-10 DIAGNOSIS — D631 Anemia in chronic kidney disease: Secondary | ICD-10-CM | POA: Diagnosis not present

## 2015-10-12 DIAGNOSIS — D509 Iron deficiency anemia, unspecified: Secondary | ICD-10-CM | POA: Diagnosis not present

## 2015-10-12 DIAGNOSIS — D631 Anemia in chronic kidney disease: Secondary | ICD-10-CM | POA: Diagnosis not present

## 2015-10-12 DIAGNOSIS — N186 End stage renal disease: Secondary | ICD-10-CM | POA: Diagnosis not present

## 2015-10-12 DIAGNOSIS — Z992 Dependence on renal dialysis: Secondary | ICD-10-CM | POA: Diagnosis not present

## 2015-10-15 DIAGNOSIS — N186 End stage renal disease: Secondary | ICD-10-CM | POA: Diagnosis not present

## 2015-10-15 DIAGNOSIS — Z992 Dependence on renal dialysis: Secondary | ICD-10-CM | POA: Diagnosis not present

## 2015-10-15 DIAGNOSIS — D509 Iron deficiency anemia, unspecified: Secondary | ICD-10-CM | POA: Diagnosis not present

## 2015-10-15 DIAGNOSIS — D631 Anemia in chronic kidney disease: Secondary | ICD-10-CM | POA: Diagnosis not present

## 2015-10-17 DIAGNOSIS — Z79899 Other long term (current) drug therapy: Secondary | ICD-10-CM | POA: Diagnosis not present

## 2015-10-17 DIAGNOSIS — J440 Chronic obstructive pulmonary disease with acute lower respiratory infection: Secondary | ICD-10-CM | POA: Diagnosis not present

## 2015-10-17 DIAGNOSIS — E119 Type 2 diabetes mellitus without complications: Secondary | ICD-10-CM | POA: Diagnosis present

## 2015-10-17 DIAGNOSIS — Z89511 Acquired absence of right leg below knee: Secondary | ICD-10-CM | POA: Diagnosis not present

## 2015-10-17 DIAGNOSIS — J168 Pneumonia due to other specified infectious organisms: Secondary | ICD-10-CM | POA: Diagnosis not present

## 2015-10-17 DIAGNOSIS — R05 Cough: Secondary | ICD-10-CM | POA: Diagnosis not present

## 2015-10-17 DIAGNOSIS — M545 Low back pain: Secondary | ICD-10-CM | POA: Diagnosis present

## 2015-10-17 DIAGNOSIS — Z800 Family history of malignant neoplasm of digestive organs: Secondary | ICD-10-CM | POA: Diagnosis not present

## 2015-10-17 DIAGNOSIS — R4182 Altered mental status, unspecified: Secondary | ICD-10-CM | POA: Diagnosis not present

## 2015-10-17 DIAGNOSIS — K5730 Diverticulosis of large intestine without perforation or abscess without bleeding: Secondary | ICD-10-CM | POA: Diagnosis not present

## 2015-10-17 DIAGNOSIS — G8929 Other chronic pain: Secondary | ICD-10-CM | POA: Diagnosis present

## 2015-10-17 DIAGNOSIS — Z79891 Long term (current) use of opiate analgesic: Secondary | ICD-10-CM | POA: Diagnosis not present

## 2015-10-17 DIAGNOSIS — E271 Primary adrenocortical insufficiency: Secondary | ICD-10-CM | POA: Diagnosis present

## 2015-10-17 DIAGNOSIS — Z992 Dependence on renal dialysis: Secondary | ICD-10-CM | POA: Diagnosis not present

## 2015-10-17 DIAGNOSIS — J9601 Acute respiratory failure with hypoxia: Secondary | ICD-10-CM | POA: Diagnosis not present

## 2015-10-17 DIAGNOSIS — D509 Iron deficiency anemia, unspecified: Secondary | ICD-10-CM | POA: Diagnosis not present

## 2015-10-17 DIAGNOSIS — R069 Unspecified abnormalities of breathing: Secondary | ICD-10-CM | POA: Diagnosis not present

## 2015-10-17 DIAGNOSIS — Z885 Allergy status to narcotic agent status: Secondary | ICD-10-CM | POA: Diagnosis not present

## 2015-10-17 DIAGNOSIS — Z952 Presence of prosthetic heart valve: Secondary | ICD-10-CM | POA: Diagnosis not present

## 2015-10-17 DIAGNOSIS — R0602 Shortness of breath: Secondary | ICD-10-CM | POA: Diagnosis not present

## 2015-10-17 DIAGNOSIS — R0902 Hypoxemia: Secondary | ICD-10-CM | POA: Diagnosis not present

## 2015-10-17 DIAGNOSIS — I132 Hypertensive heart and chronic kidney disease with heart failure and with stage 5 chronic kidney disease, or end stage renal disease: Secondary | ICD-10-CM | POA: Diagnosis present

## 2015-10-17 DIAGNOSIS — I5041 Acute combined systolic (congestive) and diastolic (congestive) heart failure: Secondary | ICD-10-CM | POA: Diagnosis not present

## 2015-10-17 DIAGNOSIS — J811 Chronic pulmonary edema: Secondary | ICD-10-CM | POA: Diagnosis not present

## 2015-10-17 DIAGNOSIS — J441 Chronic obstructive pulmonary disease with (acute) exacerbation: Secondary | ICD-10-CM | POA: Diagnosis not present

## 2015-10-17 DIAGNOSIS — I509 Heart failure, unspecified: Secondary | ICD-10-CM | POA: Diagnosis not present

## 2015-10-17 DIAGNOSIS — Z7901 Long term (current) use of anticoagulants: Secondary | ICD-10-CM | POA: Diagnosis not present

## 2015-10-17 DIAGNOSIS — D631 Anemia in chronic kidney disease: Secondary | ICD-10-CM | POA: Diagnosis not present

## 2015-10-17 DIAGNOSIS — J180 Bronchopneumonia, unspecified organism: Secondary | ICD-10-CM | POA: Diagnosis present

## 2015-10-17 DIAGNOSIS — I739 Peripheral vascular disease, unspecified: Secondary | ICD-10-CM | POA: Diagnosis present

## 2015-10-17 DIAGNOSIS — Z7951 Long term (current) use of inhaled steroids: Secondary | ICD-10-CM | POA: Diagnosis not present

## 2015-10-17 DIAGNOSIS — F17220 Nicotine dependence, chewing tobacco, uncomplicated: Secondary | ICD-10-CM | POA: Diagnosis present

## 2015-10-17 DIAGNOSIS — Z89512 Acquired absence of left leg below knee: Secondary | ICD-10-CM | POA: Diagnosis not present

## 2015-10-17 DIAGNOSIS — F329 Major depressive disorder, single episode, unspecified: Secondary | ICD-10-CM | POA: Diagnosis present

## 2015-10-17 DIAGNOSIS — D689 Coagulation defect, unspecified: Secondary | ICD-10-CM | POA: Diagnosis not present

## 2015-10-17 DIAGNOSIS — Z7952 Long term (current) use of systemic steroids: Secondary | ICD-10-CM | POA: Diagnosis not present

## 2015-10-17 DIAGNOSIS — N186 End stage renal disease: Secondary | ICD-10-CM | POA: Diagnosis present

## 2015-10-20 DIAGNOSIS — N186 End stage renal disease: Secondary | ICD-10-CM | POA: Diagnosis not present

## 2015-10-20 DIAGNOSIS — Z992 Dependence on renal dialysis: Secondary | ICD-10-CM | POA: Diagnosis not present

## 2015-10-24 DIAGNOSIS — I120 Hypertensive chronic kidney disease with stage 5 chronic kidney disease or end stage renal disease: Secondary | ICD-10-CM | POA: Diagnosis not present

## 2015-10-24 DIAGNOSIS — M545 Low back pain: Secondary | ICD-10-CM | POA: Diagnosis not present

## 2015-10-24 DIAGNOSIS — Z7952 Long term (current) use of systemic steroids: Secondary | ICD-10-CM | POA: Diagnosis not present

## 2015-10-24 DIAGNOSIS — Z7951 Long term (current) use of inhaled steroids: Secondary | ICD-10-CM | POA: Diagnosis not present

## 2015-10-24 DIAGNOSIS — Z992 Dependence on renal dialysis: Secondary | ICD-10-CM | POA: Diagnosis not present

## 2015-10-24 DIAGNOSIS — Z885 Allergy status to narcotic agent status: Secondary | ICD-10-CM | POA: Diagnosis not present

## 2015-10-24 DIAGNOSIS — Z8249 Family history of ischemic heart disease and other diseases of the circulatory system: Secondary | ICD-10-CM | POA: Diagnosis not present

## 2015-10-24 DIAGNOSIS — N186 End stage renal disease: Secondary | ICD-10-CM | POA: Diagnosis not present

## 2015-10-24 DIAGNOSIS — K8050 Calculus of bile duct without cholangitis or cholecystitis without obstruction: Secondary | ICD-10-CM | POA: Diagnosis not present

## 2015-10-24 DIAGNOSIS — R0682 Tachypnea, not elsewhere classified: Secondary | ICD-10-CM | POA: Diagnosis not present

## 2015-10-24 DIAGNOSIS — D631 Anemia in chronic kidney disease: Secondary | ICD-10-CM | POA: Diagnosis not present

## 2015-10-24 DIAGNOSIS — I10 Essential (primary) hypertension: Secondary | ICD-10-CM | POA: Diagnosis not present

## 2015-10-24 DIAGNOSIS — J441 Chronic obstructive pulmonary disease with (acute) exacerbation: Secondary | ICD-10-CM | POA: Diagnosis not present

## 2015-10-24 DIAGNOSIS — I5043 Acute on chronic combined systolic (congestive) and diastolic (congestive) heart failure: Secondary | ICD-10-CM | POA: Diagnosis not present

## 2015-10-24 DIAGNOSIS — R0602 Shortness of breath: Secondary | ICD-10-CM | POA: Diagnosis not present

## 2015-10-24 DIAGNOSIS — Z952 Presence of prosthetic heart valve: Secondary | ICD-10-CM | POA: Diagnosis not present

## 2015-10-24 DIAGNOSIS — R10811 Right upper quadrant abdominal tenderness: Secondary | ICD-10-CM | POA: Diagnosis not present

## 2015-10-24 DIAGNOSIS — Z7901 Long term (current) use of anticoagulants: Secondary | ICD-10-CM | POA: Diagnosis not present

## 2015-10-24 DIAGNOSIS — Z79899 Other long term (current) drug therapy: Secondary | ICD-10-CM | POA: Diagnosis not present

## 2015-10-24 DIAGNOSIS — R109 Unspecified abdominal pain: Secondary | ICD-10-CM | POA: Diagnosis not present

## 2015-10-24 DIAGNOSIS — I739 Peripheral vascular disease, unspecified: Secondary | ICD-10-CM | POA: Diagnosis not present

## 2015-10-24 DIAGNOSIS — J189 Pneumonia, unspecified organism: Secondary | ICD-10-CM | POA: Diagnosis not present

## 2015-10-24 DIAGNOSIS — E271 Primary adrenocortical insufficiency: Secondary | ICD-10-CM | POA: Diagnosis not present

## 2015-10-24 DIAGNOSIS — G8929 Other chronic pain: Secondary | ICD-10-CM | POA: Diagnosis not present

## 2015-10-24 DIAGNOSIS — J449 Chronic obstructive pulmonary disease, unspecified: Secondary | ICD-10-CM | POA: Diagnosis not present

## 2015-10-25 DIAGNOSIS — I10 Essential (primary) hypertension: Secondary | ICD-10-CM | POA: Diagnosis not present

## 2015-10-25 DIAGNOSIS — R109 Unspecified abdominal pain: Secondary | ICD-10-CM | POA: Diagnosis not present

## 2015-10-25 DIAGNOSIS — K838 Other specified diseases of biliary tract: Secondary | ICD-10-CM | POA: Diagnosis not present

## 2015-10-25 DIAGNOSIS — Z992 Dependence on renal dialysis: Secondary | ICD-10-CM | POA: Diagnosis not present

## 2015-10-25 DIAGNOSIS — N186 End stage renal disease: Secondary | ICD-10-CM | POA: Diagnosis not present

## 2015-10-26 DIAGNOSIS — R109 Unspecified abdominal pain: Secondary | ICD-10-CM | POA: Diagnosis not present

## 2015-10-26 DIAGNOSIS — N186 End stage renal disease: Secondary | ICD-10-CM | POA: Diagnosis not present

## 2015-10-26 DIAGNOSIS — R935 Abnormal findings on diagnostic imaging of other abdominal regions, including retroperitoneum: Secondary | ICD-10-CM | POA: Diagnosis not present

## 2015-10-26 DIAGNOSIS — I10 Essential (primary) hypertension: Secondary | ICD-10-CM | POA: Diagnosis not present

## 2015-10-26 DIAGNOSIS — K838 Other specified diseases of biliary tract: Secondary | ICD-10-CM | POA: Diagnosis not present

## 2015-10-26 DIAGNOSIS — K8020 Calculus of gallbladder without cholecystitis without obstruction: Secondary | ICD-10-CM | POA: Diagnosis not present

## 2015-10-26 DIAGNOSIS — Z992 Dependence on renal dialysis: Secondary | ICD-10-CM | POA: Diagnosis not present

## 2015-10-27 DIAGNOSIS — R109 Unspecified abdominal pain: Secondary | ICD-10-CM | POA: Diagnosis not present

## 2015-10-27 DIAGNOSIS — Z992 Dependence on renal dialysis: Secondary | ICD-10-CM | POA: Diagnosis not present

## 2015-10-27 DIAGNOSIS — I10 Essential (primary) hypertension: Secondary | ICD-10-CM | POA: Diagnosis not present

## 2015-10-27 DIAGNOSIS — N186 End stage renal disease: Secondary | ICD-10-CM | POA: Diagnosis not present

## 2015-10-30 DIAGNOSIS — D631 Anemia in chronic kidney disease: Secondary | ICD-10-CM | POA: Diagnosis not present

## 2015-10-30 DIAGNOSIS — D509 Iron deficiency anemia, unspecified: Secondary | ICD-10-CM | POA: Diagnosis not present

## 2015-10-30 DIAGNOSIS — Z992 Dependence on renal dialysis: Secondary | ICD-10-CM | POA: Diagnosis not present

## 2015-10-30 DIAGNOSIS — N186 End stage renal disease: Secondary | ICD-10-CM | POA: Diagnosis not present

## 2015-10-31 DIAGNOSIS — N186 End stage renal disease: Secondary | ICD-10-CM | POA: Diagnosis not present

## 2015-10-31 DIAGNOSIS — Z992 Dependence on renal dialysis: Secondary | ICD-10-CM | POA: Diagnosis not present

## 2015-10-31 DIAGNOSIS — D509 Iron deficiency anemia, unspecified: Secondary | ICD-10-CM | POA: Diagnosis not present

## 2015-10-31 DIAGNOSIS — D631 Anemia in chronic kidney disease: Secondary | ICD-10-CM | POA: Diagnosis not present

## 2015-11-02 DIAGNOSIS — Z992 Dependence on renal dialysis: Secondary | ICD-10-CM | POA: Diagnosis not present

## 2015-11-02 DIAGNOSIS — D631 Anemia in chronic kidney disease: Secondary | ICD-10-CM | POA: Diagnosis not present

## 2015-11-02 DIAGNOSIS — D509 Iron deficiency anemia, unspecified: Secondary | ICD-10-CM | POA: Diagnosis not present

## 2015-11-02 DIAGNOSIS — N186 End stage renal disease: Secondary | ICD-10-CM | POA: Diagnosis not present

## 2015-11-05 DIAGNOSIS — D631 Anemia in chronic kidney disease: Secondary | ICD-10-CM | POA: Diagnosis not present

## 2015-11-05 DIAGNOSIS — N186 End stage renal disease: Secondary | ICD-10-CM | POA: Diagnosis not present

## 2015-11-05 DIAGNOSIS — D509 Iron deficiency anemia, unspecified: Secondary | ICD-10-CM | POA: Diagnosis not present

## 2015-11-05 DIAGNOSIS — Z992 Dependence on renal dialysis: Secondary | ICD-10-CM | POA: Diagnosis not present

## 2015-11-07 DIAGNOSIS — D509 Iron deficiency anemia, unspecified: Secondary | ICD-10-CM | POA: Diagnosis not present

## 2015-11-07 DIAGNOSIS — D631 Anemia in chronic kidney disease: Secondary | ICD-10-CM | POA: Diagnosis not present

## 2015-11-07 DIAGNOSIS — Z992 Dependence on renal dialysis: Secondary | ICD-10-CM | POA: Diagnosis not present

## 2015-11-07 DIAGNOSIS — N186 End stage renal disease: Secondary | ICD-10-CM | POA: Diagnosis not present

## 2015-11-09 DIAGNOSIS — D631 Anemia in chronic kidney disease: Secondary | ICD-10-CM | POA: Diagnosis not present

## 2015-11-09 DIAGNOSIS — Z992 Dependence on renal dialysis: Secondary | ICD-10-CM | POA: Diagnosis not present

## 2015-11-09 DIAGNOSIS — D509 Iron deficiency anemia, unspecified: Secondary | ICD-10-CM | POA: Diagnosis not present

## 2015-11-09 DIAGNOSIS — N186 End stage renal disease: Secondary | ICD-10-CM | POA: Diagnosis not present

## 2015-11-12 DIAGNOSIS — Z992 Dependence on renal dialysis: Secondary | ICD-10-CM | POA: Diagnosis not present

## 2015-11-12 DIAGNOSIS — N186 End stage renal disease: Secondary | ICD-10-CM | POA: Diagnosis not present

## 2015-11-12 DIAGNOSIS — D509 Iron deficiency anemia, unspecified: Secondary | ICD-10-CM | POA: Diagnosis not present

## 2015-11-12 DIAGNOSIS — D631 Anemia in chronic kidney disease: Secondary | ICD-10-CM | POA: Diagnosis not present

## 2015-11-14 DIAGNOSIS — D509 Iron deficiency anemia, unspecified: Secondary | ICD-10-CM | POA: Diagnosis not present

## 2015-11-14 DIAGNOSIS — N186 End stage renal disease: Secondary | ICD-10-CM | POA: Diagnosis not present

## 2015-11-14 DIAGNOSIS — D631 Anemia in chronic kidney disease: Secondary | ICD-10-CM | POA: Diagnosis not present

## 2015-11-14 DIAGNOSIS — Z992 Dependence on renal dialysis: Secondary | ICD-10-CM | POA: Diagnosis not present

## 2015-11-15 DIAGNOSIS — Z7901 Long term (current) use of anticoagulants: Secondary | ICD-10-CM | POA: Diagnosis not present

## 2015-11-15 DIAGNOSIS — J17 Pneumonia in diseases classified elsewhere: Secondary | ICD-10-CM | POA: Diagnosis not present

## 2015-11-16 DIAGNOSIS — N186 End stage renal disease: Secondary | ICD-10-CM | POA: Diagnosis not present

## 2015-11-16 DIAGNOSIS — Z992 Dependence on renal dialysis: Secondary | ICD-10-CM | POA: Diagnosis not present

## 2015-11-16 DIAGNOSIS — D631 Anemia in chronic kidney disease: Secondary | ICD-10-CM | POA: Diagnosis not present

## 2015-11-16 DIAGNOSIS — D509 Iron deficiency anemia, unspecified: Secondary | ICD-10-CM | POA: Diagnosis not present

## 2015-11-19 DIAGNOSIS — Z992 Dependence on renal dialysis: Secondary | ICD-10-CM | POA: Diagnosis not present

## 2015-11-19 DIAGNOSIS — D509 Iron deficiency anemia, unspecified: Secondary | ICD-10-CM | POA: Diagnosis not present

## 2015-11-19 DIAGNOSIS — D631 Anemia in chronic kidney disease: Secondary | ICD-10-CM | POA: Diagnosis not present

## 2015-11-19 DIAGNOSIS — N186 End stage renal disease: Secondary | ICD-10-CM | POA: Diagnosis not present

## 2015-11-20 DIAGNOSIS — N186 End stage renal disease: Secondary | ICD-10-CM | POA: Diagnosis not present

## 2015-11-20 DIAGNOSIS — Z992 Dependence on renal dialysis: Secondary | ICD-10-CM | POA: Diagnosis not present

## 2015-11-21 DIAGNOSIS — D631 Anemia in chronic kidney disease: Secondary | ICD-10-CM | POA: Diagnosis not present

## 2015-11-21 DIAGNOSIS — D509 Iron deficiency anemia, unspecified: Secondary | ICD-10-CM | POA: Diagnosis not present

## 2015-11-21 DIAGNOSIS — N186 End stage renal disease: Secondary | ICD-10-CM | POA: Diagnosis not present

## 2015-11-21 DIAGNOSIS — Z992 Dependence on renal dialysis: Secondary | ICD-10-CM | POA: Diagnosis not present

## 2015-11-23 DIAGNOSIS — D631 Anemia in chronic kidney disease: Secondary | ICD-10-CM | POA: Diagnosis not present

## 2015-11-23 DIAGNOSIS — Z992 Dependence on renal dialysis: Secondary | ICD-10-CM | POA: Diagnosis not present

## 2015-11-23 DIAGNOSIS — D509 Iron deficiency anemia, unspecified: Secondary | ICD-10-CM | POA: Diagnosis not present

## 2015-11-23 DIAGNOSIS — N186 End stage renal disease: Secondary | ICD-10-CM | POA: Diagnosis not present

## 2015-11-26 DIAGNOSIS — N186 End stage renal disease: Secondary | ICD-10-CM | POA: Diagnosis not present

## 2015-11-26 DIAGNOSIS — D509 Iron deficiency anemia, unspecified: Secondary | ICD-10-CM | POA: Diagnosis not present

## 2015-11-26 DIAGNOSIS — Z992 Dependence on renal dialysis: Secondary | ICD-10-CM | POA: Diagnosis not present

## 2015-11-26 DIAGNOSIS — D631 Anemia in chronic kidney disease: Secondary | ICD-10-CM | POA: Diagnosis not present

## 2015-11-28 DIAGNOSIS — N186 End stage renal disease: Secondary | ICD-10-CM | POA: Diagnosis not present

## 2015-11-28 DIAGNOSIS — Z992 Dependence on renal dialysis: Secondary | ICD-10-CM | POA: Diagnosis not present

## 2015-11-28 DIAGNOSIS — D509 Iron deficiency anemia, unspecified: Secondary | ICD-10-CM | POA: Diagnosis not present

## 2015-11-28 DIAGNOSIS — D631 Anemia in chronic kidney disease: Secondary | ICD-10-CM | POA: Diagnosis not present

## 2015-11-30 DIAGNOSIS — N186 End stage renal disease: Secondary | ICD-10-CM | POA: Diagnosis not present

## 2015-11-30 DIAGNOSIS — Z992 Dependence on renal dialysis: Secondary | ICD-10-CM | POA: Diagnosis not present

## 2015-11-30 DIAGNOSIS — D509 Iron deficiency anemia, unspecified: Secondary | ICD-10-CM | POA: Diagnosis not present

## 2015-11-30 DIAGNOSIS — D631 Anemia in chronic kidney disease: Secondary | ICD-10-CM | POA: Diagnosis not present

## 2015-12-03 DIAGNOSIS — D509 Iron deficiency anemia, unspecified: Secondary | ICD-10-CM | POA: Diagnosis not present

## 2015-12-03 DIAGNOSIS — N186 End stage renal disease: Secondary | ICD-10-CM | POA: Diagnosis not present

## 2015-12-03 DIAGNOSIS — Z992 Dependence on renal dialysis: Secondary | ICD-10-CM | POA: Diagnosis not present

## 2015-12-03 DIAGNOSIS — D631 Anemia in chronic kidney disease: Secondary | ICD-10-CM | POA: Diagnosis not present

## 2015-12-05 DIAGNOSIS — Z992 Dependence on renal dialysis: Secondary | ICD-10-CM | POA: Diagnosis not present

## 2015-12-05 DIAGNOSIS — D631 Anemia in chronic kidney disease: Secondary | ICD-10-CM | POA: Diagnosis not present

## 2015-12-05 DIAGNOSIS — D509 Iron deficiency anemia, unspecified: Secondary | ICD-10-CM | POA: Diagnosis not present

## 2015-12-05 DIAGNOSIS — N186 End stage renal disease: Secondary | ICD-10-CM | POA: Diagnosis not present

## 2015-12-07 DIAGNOSIS — D509 Iron deficiency anemia, unspecified: Secondary | ICD-10-CM | POA: Diagnosis not present

## 2015-12-07 DIAGNOSIS — Z992 Dependence on renal dialysis: Secondary | ICD-10-CM | POA: Diagnosis not present

## 2015-12-07 DIAGNOSIS — N186 End stage renal disease: Secondary | ICD-10-CM | POA: Diagnosis not present

## 2015-12-07 DIAGNOSIS — D631 Anemia in chronic kidney disease: Secondary | ICD-10-CM | POA: Diagnosis not present

## 2015-12-10 DIAGNOSIS — Z992 Dependence on renal dialysis: Secondary | ICD-10-CM | POA: Diagnosis not present

## 2015-12-10 DIAGNOSIS — D509 Iron deficiency anemia, unspecified: Secondary | ICD-10-CM | POA: Diagnosis not present

## 2015-12-10 DIAGNOSIS — N186 End stage renal disease: Secondary | ICD-10-CM | POA: Diagnosis not present

## 2015-12-10 DIAGNOSIS — D631 Anemia in chronic kidney disease: Secondary | ICD-10-CM | POA: Diagnosis not present

## 2015-12-11 DIAGNOSIS — Z452 Encounter for adjustment and management of vascular access device: Secondary | ICD-10-CM | POA: Diagnosis not present

## 2015-12-12 DIAGNOSIS — D509 Iron deficiency anemia, unspecified: Secondary | ICD-10-CM | POA: Diagnosis not present

## 2015-12-12 DIAGNOSIS — Z992 Dependence on renal dialysis: Secondary | ICD-10-CM | POA: Diagnosis not present

## 2015-12-12 DIAGNOSIS — D631 Anemia in chronic kidney disease: Secondary | ICD-10-CM | POA: Diagnosis not present

## 2015-12-12 DIAGNOSIS — N186 End stage renal disease: Secondary | ICD-10-CM | POA: Diagnosis not present

## 2015-12-14 DIAGNOSIS — N186 End stage renal disease: Secondary | ICD-10-CM | POA: Diagnosis not present

## 2015-12-14 DIAGNOSIS — D631 Anemia in chronic kidney disease: Secondary | ICD-10-CM | POA: Diagnosis not present

## 2015-12-14 DIAGNOSIS — Z992 Dependence on renal dialysis: Secondary | ICD-10-CM | POA: Diagnosis not present

## 2015-12-14 DIAGNOSIS — D509 Iron deficiency anemia, unspecified: Secondary | ICD-10-CM | POA: Diagnosis not present

## 2015-12-17 DIAGNOSIS — N186 End stage renal disease: Secondary | ICD-10-CM | POA: Diagnosis not present

## 2015-12-17 DIAGNOSIS — D631 Anemia in chronic kidney disease: Secondary | ICD-10-CM | POA: Diagnosis not present

## 2015-12-17 DIAGNOSIS — Z992 Dependence on renal dialysis: Secondary | ICD-10-CM | POA: Diagnosis not present

## 2015-12-17 DIAGNOSIS — D509 Iron deficiency anemia, unspecified: Secondary | ICD-10-CM | POA: Diagnosis not present

## 2015-12-18 DIAGNOSIS — N186 End stage renal disease: Secondary | ICD-10-CM | POA: Diagnosis not present

## 2015-12-18 DIAGNOSIS — Z992 Dependence on renal dialysis: Secondary | ICD-10-CM | POA: Diagnosis not present

## 2015-12-19 DIAGNOSIS — Z992 Dependence on renal dialysis: Secondary | ICD-10-CM | POA: Diagnosis not present

## 2015-12-19 DIAGNOSIS — D509 Iron deficiency anemia, unspecified: Secondary | ICD-10-CM | POA: Diagnosis not present

## 2015-12-19 DIAGNOSIS — N186 End stage renal disease: Secondary | ICD-10-CM | POA: Diagnosis not present

## 2015-12-19 DIAGNOSIS — D631 Anemia in chronic kidney disease: Secondary | ICD-10-CM | POA: Diagnosis not present

## 2015-12-21 DIAGNOSIS — D631 Anemia in chronic kidney disease: Secondary | ICD-10-CM | POA: Diagnosis not present

## 2015-12-21 DIAGNOSIS — N186 End stage renal disease: Secondary | ICD-10-CM | POA: Diagnosis not present

## 2015-12-21 DIAGNOSIS — Z992 Dependence on renal dialysis: Secondary | ICD-10-CM | POA: Diagnosis not present

## 2015-12-21 DIAGNOSIS — D509 Iron deficiency anemia, unspecified: Secondary | ICD-10-CM | POA: Diagnosis not present

## 2015-12-24 DIAGNOSIS — D509 Iron deficiency anemia, unspecified: Secondary | ICD-10-CM | POA: Diagnosis not present

## 2015-12-24 DIAGNOSIS — Z992 Dependence on renal dialysis: Secondary | ICD-10-CM | POA: Diagnosis not present

## 2015-12-24 DIAGNOSIS — D631 Anemia in chronic kidney disease: Secondary | ICD-10-CM | POA: Diagnosis not present

## 2015-12-24 DIAGNOSIS — N186 End stage renal disease: Secondary | ICD-10-CM | POA: Diagnosis not present

## 2015-12-26 DIAGNOSIS — D631 Anemia in chronic kidney disease: Secondary | ICD-10-CM | POA: Diagnosis not present

## 2015-12-26 DIAGNOSIS — D509 Iron deficiency anemia, unspecified: Secondary | ICD-10-CM | POA: Diagnosis not present

## 2015-12-26 DIAGNOSIS — Z992 Dependence on renal dialysis: Secondary | ICD-10-CM | POA: Diagnosis not present

## 2015-12-26 DIAGNOSIS — N186 End stage renal disease: Secondary | ICD-10-CM | POA: Diagnosis not present

## 2015-12-28 DIAGNOSIS — D509 Iron deficiency anemia, unspecified: Secondary | ICD-10-CM | POA: Diagnosis not present

## 2015-12-28 DIAGNOSIS — N186 End stage renal disease: Secondary | ICD-10-CM | POA: Diagnosis not present

## 2015-12-28 DIAGNOSIS — D631 Anemia in chronic kidney disease: Secondary | ICD-10-CM | POA: Diagnosis not present

## 2015-12-28 DIAGNOSIS — Z992 Dependence on renal dialysis: Secondary | ICD-10-CM | POA: Diagnosis not present

## 2015-12-31 DIAGNOSIS — N186 End stage renal disease: Secondary | ICD-10-CM | POA: Diagnosis not present

## 2015-12-31 DIAGNOSIS — Z992 Dependence on renal dialysis: Secondary | ICD-10-CM | POA: Diagnosis not present

## 2015-12-31 DIAGNOSIS — D631 Anemia in chronic kidney disease: Secondary | ICD-10-CM | POA: Diagnosis not present

## 2015-12-31 DIAGNOSIS — D509 Iron deficiency anemia, unspecified: Secondary | ICD-10-CM | POA: Diagnosis not present

## 2016-01-02 DIAGNOSIS — D509 Iron deficiency anemia, unspecified: Secondary | ICD-10-CM | POA: Diagnosis not present

## 2016-01-02 DIAGNOSIS — Z992 Dependence on renal dialysis: Secondary | ICD-10-CM | POA: Diagnosis not present

## 2016-01-02 DIAGNOSIS — N186 End stage renal disease: Secondary | ICD-10-CM | POA: Diagnosis not present

## 2016-01-02 DIAGNOSIS — D631 Anemia in chronic kidney disease: Secondary | ICD-10-CM | POA: Diagnosis not present

## 2016-01-04 DIAGNOSIS — Z992 Dependence on renal dialysis: Secondary | ICD-10-CM | POA: Diagnosis not present

## 2016-01-04 DIAGNOSIS — D631 Anemia in chronic kidney disease: Secondary | ICD-10-CM | POA: Diagnosis not present

## 2016-01-04 DIAGNOSIS — D509 Iron deficiency anemia, unspecified: Secondary | ICD-10-CM | POA: Diagnosis not present

## 2016-01-04 DIAGNOSIS — N186 End stage renal disease: Secondary | ICD-10-CM | POA: Diagnosis not present

## 2016-01-07 DIAGNOSIS — N186 End stage renal disease: Secondary | ICD-10-CM | POA: Diagnosis not present

## 2016-01-07 DIAGNOSIS — Z992 Dependence on renal dialysis: Secondary | ICD-10-CM | POA: Diagnosis not present

## 2016-01-07 DIAGNOSIS — D631 Anemia in chronic kidney disease: Secondary | ICD-10-CM | POA: Diagnosis not present

## 2016-01-07 DIAGNOSIS — D509 Iron deficiency anemia, unspecified: Secondary | ICD-10-CM | POA: Diagnosis not present

## 2016-01-08 DIAGNOSIS — Z452 Encounter for adjustment and management of vascular access device: Secondary | ICD-10-CM | POA: Diagnosis not present

## 2016-01-09 DIAGNOSIS — N186 End stage renal disease: Secondary | ICD-10-CM | POA: Diagnosis not present

## 2016-01-09 DIAGNOSIS — Z992 Dependence on renal dialysis: Secondary | ICD-10-CM | POA: Diagnosis not present

## 2016-01-09 DIAGNOSIS — D509 Iron deficiency anemia, unspecified: Secondary | ICD-10-CM | POA: Diagnosis not present

## 2016-01-09 DIAGNOSIS — D631 Anemia in chronic kidney disease: Secondary | ICD-10-CM | POA: Diagnosis not present

## 2016-01-11 DIAGNOSIS — N186 End stage renal disease: Secondary | ICD-10-CM | POA: Diagnosis not present

## 2016-01-11 DIAGNOSIS — Z992 Dependence on renal dialysis: Secondary | ICD-10-CM | POA: Diagnosis not present

## 2016-01-11 DIAGNOSIS — D509 Iron deficiency anemia, unspecified: Secondary | ICD-10-CM | POA: Diagnosis not present

## 2016-01-11 DIAGNOSIS — D631 Anemia in chronic kidney disease: Secondary | ICD-10-CM | POA: Diagnosis not present

## 2016-01-14 DIAGNOSIS — D509 Iron deficiency anemia, unspecified: Secondary | ICD-10-CM | POA: Diagnosis not present

## 2016-01-14 DIAGNOSIS — Z992 Dependence on renal dialysis: Secondary | ICD-10-CM | POA: Diagnosis not present

## 2016-01-14 DIAGNOSIS — E119 Type 2 diabetes mellitus without complications: Secondary | ICD-10-CM | POA: Diagnosis not present

## 2016-01-14 DIAGNOSIS — D631 Anemia in chronic kidney disease: Secondary | ICD-10-CM | POA: Diagnosis not present

## 2016-01-14 DIAGNOSIS — N186 End stage renal disease: Secondary | ICD-10-CM | POA: Diagnosis not present

## 2016-01-15 DIAGNOSIS — M545 Low back pain: Secondary | ICD-10-CM | POA: Diagnosis not present

## 2016-01-16 DIAGNOSIS — Z992 Dependence on renal dialysis: Secondary | ICD-10-CM | POA: Diagnosis not present

## 2016-01-16 DIAGNOSIS — D509 Iron deficiency anemia, unspecified: Secondary | ICD-10-CM | POA: Diagnosis not present

## 2016-01-16 DIAGNOSIS — N186 End stage renal disease: Secondary | ICD-10-CM | POA: Diagnosis not present

## 2016-01-16 DIAGNOSIS — D631 Anemia in chronic kidney disease: Secondary | ICD-10-CM | POA: Diagnosis not present

## 2016-01-18 DIAGNOSIS — Z992 Dependence on renal dialysis: Secondary | ICD-10-CM | POA: Diagnosis not present

## 2016-01-18 DIAGNOSIS — D509 Iron deficiency anemia, unspecified: Secondary | ICD-10-CM | POA: Diagnosis not present

## 2016-01-18 DIAGNOSIS — D631 Anemia in chronic kidney disease: Secondary | ICD-10-CM | POA: Diagnosis not present

## 2016-01-18 DIAGNOSIS — N186 End stage renal disease: Secondary | ICD-10-CM | POA: Diagnosis not present

## 2016-01-21 DIAGNOSIS — N186 End stage renal disease: Secondary | ICD-10-CM | POA: Diagnosis not present

## 2016-01-21 DIAGNOSIS — Z992 Dependence on renal dialysis: Secondary | ICD-10-CM | POA: Diagnosis not present

## 2016-01-21 DIAGNOSIS — D631 Anemia in chronic kidney disease: Secondary | ICD-10-CM | POA: Diagnosis not present

## 2016-01-21 DIAGNOSIS — D509 Iron deficiency anemia, unspecified: Secondary | ICD-10-CM | POA: Diagnosis not present

## 2016-01-23 DIAGNOSIS — D631 Anemia in chronic kidney disease: Secondary | ICD-10-CM | POA: Diagnosis not present

## 2016-01-23 DIAGNOSIS — N186 End stage renal disease: Secondary | ICD-10-CM | POA: Diagnosis not present

## 2016-01-23 DIAGNOSIS — Z992 Dependence on renal dialysis: Secondary | ICD-10-CM | POA: Diagnosis not present

## 2016-01-23 DIAGNOSIS — D509 Iron deficiency anemia, unspecified: Secondary | ICD-10-CM | POA: Diagnosis not present

## 2016-01-25 DIAGNOSIS — D509 Iron deficiency anemia, unspecified: Secondary | ICD-10-CM | POA: Diagnosis not present

## 2016-01-25 DIAGNOSIS — D631 Anemia in chronic kidney disease: Secondary | ICD-10-CM | POA: Diagnosis not present

## 2016-01-25 DIAGNOSIS — Z992 Dependence on renal dialysis: Secondary | ICD-10-CM | POA: Diagnosis not present

## 2016-01-25 DIAGNOSIS — N186 End stage renal disease: Secondary | ICD-10-CM | POA: Diagnosis not present

## 2016-01-28 DIAGNOSIS — Z992 Dependence on renal dialysis: Secondary | ICD-10-CM | POA: Diagnosis not present

## 2016-01-28 DIAGNOSIS — N186 End stage renal disease: Secondary | ICD-10-CM | POA: Diagnosis not present

## 2016-01-28 DIAGNOSIS — D631 Anemia in chronic kidney disease: Secondary | ICD-10-CM | POA: Diagnosis not present

## 2016-01-28 DIAGNOSIS — D509 Iron deficiency anemia, unspecified: Secondary | ICD-10-CM | POA: Diagnosis not present

## 2016-01-30 DIAGNOSIS — D631 Anemia in chronic kidney disease: Secondary | ICD-10-CM | POA: Diagnosis not present

## 2016-01-30 DIAGNOSIS — D509 Iron deficiency anemia, unspecified: Secondary | ICD-10-CM | POA: Diagnosis not present

## 2016-01-30 DIAGNOSIS — N186 End stage renal disease: Secondary | ICD-10-CM | POA: Diagnosis not present

## 2016-01-30 DIAGNOSIS — Z992 Dependence on renal dialysis: Secondary | ICD-10-CM | POA: Diagnosis not present

## 2016-02-01 DIAGNOSIS — D509 Iron deficiency anemia, unspecified: Secondary | ICD-10-CM | POA: Diagnosis not present

## 2016-02-01 DIAGNOSIS — N186 End stage renal disease: Secondary | ICD-10-CM | POA: Diagnosis not present

## 2016-02-01 DIAGNOSIS — Z992 Dependence on renal dialysis: Secondary | ICD-10-CM | POA: Diagnosis not present

## 2016-02-01 DIAGNOSIS — D631 Anemia in chronic kidney disease: Secondary | ICD-10-CM | POA: Diagnosis not present

## 2016-02-04 DIAGNOSIS — Z992 Dependence on renal dialysis: Secondary | ICD-10-CM | POA: Diagnosis not present

## 2016-02-04 DIAGNOSIS — N186 End stage renal disease: Secondary | ICD-10-CM | POA: Diagnosis not present

## 2016-02-04 DIAGNOSIS — D631 Anemia in chronic kidney disease: Secondary | ICD-10-CM | POA: Diagnosis not present

## 2016-02-04 DIAGNOSIS — D509 Iron deficiency anemia, unspecified: Secondary | ICD-10-CM | POA: Diagnosis not present

## 2016-02-06 DIAGNOSIS — D631 Anemia in chronic kidney disease: Secondary | ICD-10-CM | POA: Diagnosis not present

## 2016-02-06 DIAGNOSIS — N186 End stage renal disease: Secondary | ICD-10-CM | POA: Diagnosis not present

## 2016-02-06 DIAGNOSIS — Z992 Dependence on renal dialysis: Secondary | ICD-10-CM | POA: Diagnosis not present

## 2016-02-06 DIAGNOSIS — D509 Iron deficiency anemia, unspecified: Secondary | ICD-10-CM | POA: Diagnosis not present

## 2016-02-08 DIAGNOSIS — N186 End stage renal disease: Secondary | ICD-10-CM | POA: Diagnosis not present

## 2016-02-08 DIAGNOSIS — D631 Anemia in chronic kidney disease: Secondary | ICD-10-CM | POA: Diagnosis not present

## 2016-02-08 DIAGNOSIS — D509 Iron deficiency anemia, unspecified: Secondary | ICD-10-CM | POA: Diagnosis not present

## 2016-02-08 DIAGNOSIS — Z992 Dependence on renal dialysis: Secondary | ICD-10-CM | POA: Diagnosis not present

## 2016-02-11 DIAGNOSIS — Z992 Dependence on renal dialysis: Secondary | ICD-10-CM | POA: Diagnosis not present

## 2016-02-11 DIAGNOSIS — N186 End stage renal disease: Secondary | ICD-10-CM | POA: Diagnosis not present

## 2016-02-11 DIAGNOSIS — D509 Iron deficiency anemia, unspecified: Secondary | ICD-10-CM | POA: Diagnosis not present

## 2016-02-11 DIAGNOSIS — D631 Anemia in chronic kidney disease: Secondary | ICD-10-CM | POA: Diagnosis not present

## 2016-02-12 DIAGNOSIS — Z452 Encounter for adjustment and management of vascular access device: Secondary | ICD-10-CM | POA: Diagnosis not present

## 2016-02-13 DIAGNOSIS — D509 Iron deficiency anemia, unspecified: Secondary | ICD-10-CM | POA: Diagnosis not present

## 2016-02-13 DIAGNOSIS — Z992 Dependence on renal dialysis: Secondary | ICD-10-CM | POA: Diagnosis not present

## 2016-02-13 DIAGNOSIS — D631 Anemia in chronic kidney disease: Secondary | ICD-10-CM | POA: Diagnosis not present

## 2016-02-13 DIAGNOSIS — N186 End stage renal disease: Secondary | ICD-10-CM | POA: Diagnosis not present

## 2016-02-15 DIAGNOSIS — Z992 Dependence on renal dialysis: Secondary | ICD-10-CM | POA: Diagnosis not present

## 2016-02-15 DIAGNOSIS — N186 End stage renal disease: Secondary | ICD-10-CM | POA: Diagnosis not present

## 2016-02-15 DIAGNOSIS — D509 Iron deficiency anemia, unspecified: Secondary | ICD-10-CM | POA: Diagnosis not present

## 2016-02-15 DIAGNOSIS — D631 Anemia in chronic kidney disease: Secondary | ICD-10-CM | POA: Diagnosis not present

## 2016-02-17 DIAGNOSIS — Z992 Dependence on renal dialysis: Secondary | ICD-10-CM | POA: Diagnosis not present

## 2016-02-17 DIAGNOSIS — N186 End stage renal disease: Secondary | ICD-10-CM | POA: Diagnosis not present

## 2016-02-18 DIAGNOSIS — Z992 Dependence on renal dialysis: Secondary | ICD-10-CM | POA: Diagnosis not present

## 2016-02-18 DIAGNOSIS — N186 End stage renal disease: Secondary | ICD-10-CM | POA: Diagnosis not present

## 2016-02-18 DIAGNOSIS — D631 Anemia in chronic kidney disease: Secondary | ICD-10-CM | POA: Diagnosis not present

## 2016-02-18 DIAGNOSIS — D509 Iron deficiency anemia, unspecified: Secondary | ICD-10-CM | POA: Diagnosis not present

## 2016-02-20 DIAGNOSIS — D509 Iron deficiency anemia, unspecified: Secondary | ICD-10-CM | POA: Diagnosis not present

## 2016-02-20 DIAGNOSIS — Z992 Dependence on renal dialysis: Secondary | ICD-10-CM | POA: Diagnosis not present

## 2016-02-20 DIAGNOSIS — D631 Anemia in chronic kidney disease: Secondary | ICD-10-CM | POA: Diagnosis not present

## 2016-02-20 DIAGNOSIS — N186 End stage renal disease: Secondary | ICD-10-CM | POA: Diagnosis not present

## 2016-02-22 DIAGNOSIS — D509 Iron deficiency anemia, unspecified: Secondary | ICD-10-CM | POA: Diagnosis not present

## 2016-02-22 DIAGNOSIS — Z992 Dependence on renal dialysis: Secondary | ICD-10-CM | POA: Diagnosis not present

## 2016-02-22 DIAGNOSIS — N186 End stage renal disease: Secondary | ICD-10-CM | POA: Diagnosis not present

## 2016-02-22 DIAGNOSIS — D631 Anemia in chronic kidney disease: Secondary | ICD-10-CM | POA: Diagnosis not present

## 2016-02-23 DIAGNOSIS — D631 Anemia in chronic kidney disease: Secondary | ICD-10-CM | POA: Diagnosis not present

## 2016-02-23 DIAGNOSIS — Z992 Dependence on renal dialysis: Secondary | ICD-10-CM | POA: Diagnosis not present

## 2016-02-23 DIAGNOSIS — D509 Iron deficiency anemia, unspecified: Secondary | ICD-10-CM | POA: Diagnosis not present

## 2016-02-23 DIAGNOSIS — N186 End stage renal disease: Secondary | ICD-10-CM | POA: Diagnosis not present

## 2016-02-25 DIAGNOSIS — N186 End stage renal disease: Secondary | ICD-10-CM | POA: Diagnosis not present

## 2016-02-25 DIAGNOSIS — D631 Anemia in chronic kidney disease: Secondary | ICD-10-CM | POA: Diagnosis not present

## 2016-02-25 DIAGNOSIS — D509 Iron deficiency anemia, unspecified: Secondary | ICD-10-CM | POA: Diagnosis not present

## 2016-02-25 DIAGNOSIS — Z992 Dependence on renal dialysis: Secondary | ICD-10-CM | POA: Diagnosis not present

## 2016-02-27 DIAGNOSIS — D509 Iron deficiency anemia, unspecified: Secondary | ICD-10-CM | POA: Diagnosis not present

## 2016-02-27 DIAGNOSIS — N186 End stage renal disease: Secondary | ICD-10-CM | POA: Diagnosis not present

## 2016-02-27 DIAGNOSIS — Z992 Dependence on renal dialysis: Secondary | ICD-10-CM | POA: Diagnosis not present

## 2016-02-27 DIAGNOSIS — D631 Anemia in chronic kidney disease: Secondary | ICD-10-CM | POA: Diagnosis not present

## 2016-02-29 DIAGNOSIS — D631 Anemia in chronic kidney disease: Secondary | ICD-10-CM | POA: Diagnosis not present

## 2016-02-29 DIAGNOSIS — N186 End stage renal disease: Secondary | ICD-10-CM | POA: Diagnosis not present

## 2016-02-29 DIAGNOSIS — Z992 Dependence on renal dialysis: Secondary | ICD-10-CM | POA: Diagnosis not present

## 2016-02-29 DIAGNOSIS — D509 Iron deficiency anemia, unspecified: Secondary | ICD-10-CM | POA: Diagnosis not present

## 2016-03-03 DIAGNOSIS — N186 End stage renal disease: Secondary | ICD-10-CM | POA: Diagnosis not present

## 2016-03-03 DIAGNOSIS — D509 Iron deficiency anemia, unspecified: Secondary | ICD-10-CM | POA: Diagnosis not present

## 2016-03-03 DIAGNOSIS — D631 Anemia in chronic kidney disease: Secondary | ICD-10-CM | POA: Diagnosis not present

## 2016-03-03 DIAGNOSIS — Z992 Dependence on renal dialysis: Secondary | ICD-10-CM | POA: Diagnosis not present

## 2016-03-05 DIAGNOSIS — N186 End stage renal disease: Secondary | ICD-10-CM | POA: Diagnosis not present

## 2016-03-05 DIAGNOSIS — D631 Anemia in chronic kidney disease: Secondary | ICD-10-CM | POA: Diagnosis not present

## 2016-03-05 DIAGNOSIS — Z992 Dependence on renal dialysis: Secondary | ICD-10-CM | POA: Diagnosis not present

## 2016-03-05 DIAGNOSIS — D509 Iron deficiency anemia, unspecified: Secondary | ICD-10-CM | POA: Diagnosis not present

## 2016-03-07 DIAGNOSIS — Z992 Dependence on renal dialysis: Secondary | ICD-10-CM | POA: Diagnosis not present

## 2016-03-07 DIAGNOSIS — D631 Anemia in chronic kidney disease: Secondary | ICD-10-CM | POA: Diagnosis not present

## 2016-03-07 DIAGNOSIS — N186 End stage renal disease: Secondary | ICD-10-CM | POA: Diagnosis not present

## 2016-03-07 DIAGNOSIS — D509 Iron deficiency anemia, unspecified: Secondary | ICD-10-CM | POA: Diagnosis not present

## 2016-03-10 DIAGNOSIS — D509 Iron deficiency anemia, unspecified: Secondary | ICD-10-CM | POA: Diagnosis not present

## 2016-03-10 DIAGNOSIS — N186 End stage renal disease: Secondary | ICD-10-CM | POA: Diagnosis not present

## 2016-03-10 DIAGNOSIS — D631 Anemia in chronic kidney disease: Secondary | ICD-10-CM | POA: Diagnosis not present

## 2016-03-10 DIAGNOSIS — Z992 Dependence on renal dialysis: Secondary | ICD-10-CM | POA: Diagnosis not present

## 2016-03-11 DIAGNOSIS — Z452 Encounter for adjustment and management of vascular access device: Secondary | ICD-10-CM | POA: Diagnosis not present

## 2016-03-12 DIAGNOSIS — D509 Iron deficiency anemia, unspecified: Secondary | ICD-10-CM | POA: Diagnosis not present

## 2016-03-12 DIAGNOSIS — N186 End stage renal disease: Secondary | ICD-10-CM | POA: Diagnosis not present

## 2016-03-12 DIAGNOSIS — D631 Anemia in chronic kidney disease: Secondary | ICD-10-CM | POA: Diagnosis not present

## 2016-03-12 DIAGNOSIS — Z992 Dependence on renal dialysis: Secondary | ICD-10-CM | POA: Diagnosis not present

## 2016-03-14 DIAGNOSIS — Z992 Dependence on renal dialysis: Secondary | ICD-10-CM | POA: Diagnosis not present

## 2016-03-14 DIAGNOSIS — D631 Anemia in chronic kidney disease: Secondary | ICD-10-CM | POA: Diagnosis not present

## 2016-03-14 DIAGNOSIS — N186 End stage renal disease: Secondary | ICD-10-CM | POA: Diagnosis not present

## 2016-03-14 DIAGNOSIS — D509 Iron deficiency anemia, unspecified: Secondary | ICD-10-CM | POA: Diagnosis not present

## 2016-03-16 DIAGNOSIS — E8770 Fluid overload, unspecified: Secondary | ICD-10-CM | POA: Diagnosis not present

## 2016-03-16 DIAGNOSIS — N186 End stage renal disease: Secondary | ICD-10-CM | POA: Diagnosis present

## 2016-03-16 DIAGNOSIS — D500 Iron deficiency anemia secondary to blood loss (chronic): Secondary | ICD-10-CM | POA: Diagnosis present

## 2016-03-16 DIAGNOSIS — J189 Pneumonia, unspecified organism: Secondary | ICD-10-CM | POA: Diagnosis present

## 2016-03-16 DIAGNOSIS — Z79891 Long term (current) use of opiate analgesic: Secondary | ICD-10-CM | POA: Diagnosis not present

## 2016-03-16 DIAGNOSIS — Z992 Dependence on renal dialysis: Secondary | ICD-10-CM | POA: Diagnosis not present

## 2016-03-16 DIAGNOSIS — R0602 Shortness of breath: Secondary | ICD-10-CM | POA: Diagnosis not present

## 2016-03-16 DIAGNOSIS — Z952 Presence of prosthetic heart valve: Secondary | ICD-10-CM | POA: Diagnosis not present

## 2016-03-16 DIAGNOSIS — J440 Chronic obstructive pulmonary disease with acute lower respiratory infection: Secondary | ICD-10-CM | POA: Diagnosis present

## 2016-03-16 DIAGNOSIS — Z89511 Acquired absence of right leg below knee: Secondary | ICD-10-CM | POA: Diagnosis not present

## 2016-03-16 DIAGNOSIS — Z7902 Long term (current) use of antithrombotics/antiplatelets: Secondary | ICD-10-CM | POA: Diagnosis not present

## 2016-03-16 DIAGNOSIS — R000 Tachycardia, unspecified: Secondary | ICD-10-CM | POA: Diagnosis not present

## 2016-03-16 DIAGNOSIS — E1129 Type 2 diabetes mellitus with other diabetic kidney complication: Secondary | ICD-10-CM | POA: Diagnosis not present

## 2016-03-16 DIAGNOSIS — G8929 Other chronic pain: Secondary | ICD-10-CM | POA: Diagnosis present

## 2016-03-16 DIAGNOSIS — D631 Anemia in chronic kidney disease: Secondary | ICD-10-CM | POA: Diagnosis present

## 2016-03-16 DIAGNOSIS — I10 Essential (primary) hypertension: Secondary | ICD-10-CM | POA: Diagnosis not present

## 2016-03-16 DIAGNOSIS — E1122 Type 2 diabetes mellitus with diabetic chronic kidney disease: Secondary | ICD-10-CM | POA: Diagnosis present

## 2016-03-16 DIAGNOSIS — Z79899 Other long term (current) drug therapy: Secondary | ICD-10-CM | POA: Diagnosis not present

## 2016-03-16 DIAGNOSIS — Z89512 Acquired absence of left leg below knee: Secondary | ICD-10-CM | POA: Diagnosis not present

## 2016-03-16 DIAGNOSIS — I5043 Acute on chronic combined systolic (congestive) and diastolic (congestive) heart failure: Secondary | ICD-10-CM | POA: Diagnosis present

## 2016-03-16 DIAGNOSIS — R0682 Tachypnea, not elsewhere classified: Secondary | ICD-10-CM | POA: Diagnosis not present

## 2016-03-16 DIAGNOSIS — Z7952 Long term (current) use of systemic steroids: Secondary | ICD-10-CM | POA: Diagnosis not present

## 2016-03-16 DIAGNOSIS — J168 Pneumonia due to other specified infectious organisms: Secondary | ICD-10-CM | POA: Diagnosis not present

## 2016-03-16 DIAGNOSIS — E271 Primary adrenocortical insufficiency: Secondary | ICD-10-CM | POA: Diagnosis not present

## 2016-03-16 DIAGNOSIS — J90 Pleural effusion, not elsewhere classified: Secondary | ICD-10-CM | POA: Diagnosis not present

## 2016-03-16 DIAGNOSIS — M545 Low back pain: Secondary | ICD-10-CM | POA: Diagnosis present

## 2016-03-16 DIAGNOSIS — I132 Hypertensive heart and chronic kidney disease with heart failure and with stage 5 chronic kidney disease, or end stage renal disease: Secondary | ICD-10-CM | POA: Diagnosis present

## 2016-03-16 DIAGNOSIS — R062 Wheezing: Secondary | ICD-10-CM | POA: Diagnosis not present

## 2016-03-16 DIAGNOSIS — J449 Chronic obstructive pulmonary disease, unspecified: Secondary | ICD-10-CM | POA: Diagnosis not present

## 2016-03-16 DIAGNOSIS — I739 Peripheral vascular disease, unspecified: Secondary | ICD-10-CM | POA: Diagnosis present

## 2016-03-16 DIAGNOSIS — Z885 Allergy status to narcotic agent status: Secondary | ICD-10-CM | POA: Diagnosis not present

## 2016-03-19 DIAGNOSIS — N186 End stage renal disease: Secondary | ICD-10-CM | POA: Diagnosis not present

## 2016-03-19 DIAGNOSIS — Z992 Dependence on renal dialysis: Secondary | ICD-10-CM | POA: Diagnosis not present

## 2016-03-24 DIAGNOSIS — D509 Iron deficiency anemia, unspecified: Secondary | ICD-10-CM | POA: Diagnosis not present

## 2016-03-24 DIAGNOSIS — N186 End stage renal disease: Secondary | ICD-10-CM | POA: Diagnosis not present

## 2016-03-24 DIAGNOSIS — Z992 Dependence on renal dialysis: Secondary | ICD-10-CM | POA: Diagnosis not present

## 2016-03-24 DIAGNOSIS — N2581 Secondary hyperparathyroidism of renal origin: Secondary | ICD-10-CM | POA: Diagnosis not present

## 2016-03-24 DIAGNOSIS — D631 Anemia in chronic kidney disease: Secondary | ICD-10-CM | POA: Diagnosis not present

## 2016-03-26 DIAGNOSIS — N186 End stage renal disease: Secondary | ICD-10-CM | POA: Diagnosis not present

## 2016-03-26 DIAGNOSIS — Z992 Dependence on renal dialysis: Secondary | ICD-10-CM | POA: Diagnosis not present

## 2016-03-26 DIAGNOSIS — D631 Anemia in chronic kidney disease: Secondary | ICD-10-CM | POA: Diagnosis not present

## 2016-03-26 DIAGNOSIS — N2581 Secondary hyperparathyroidism of renal origin: Secondary | ICD-10-CM | POA: Diagnosis not present

## 2016-03-26 DIAGNOSIS — D509 Iron deficiency anemia, unspecified: Secondary | ICD-10-CM | POA: Diagnosis not present

## 2016-03-27 DIAGNOSIS — J168 Pneumonia due to other specified infectious organisms: Secondary | ICD-10-CM | POA: Diagnosis not present

## 2016-03-27 DIAGNOSIS — Z7901 Long term (current) use of anticoagulants: Secondary | ICD-10-CM | POA: Diagnosis not present

## 2016-03-27 DIAGNOSIS — M545 Low back pain: Secondary | ICD-10-CM | POA: Diagnosis not present

## 2016-03-28 DIAGNOSIS — N186 End stage renal disease: Secondary | ICD-10-CM | POA: Diagnosis not present

## 2016-03-28 DIAGNOSIS — Z992 Dependence on renal dialysis: Secondary | ICD-10-CM | POA: Diagnosis not present

## 2016-03-28 DIAGNOSIS — D631 Anemia in chronic kidney disease: Secondary | ICD-10-CM | POA: Diagnosis not present

## 2016-03-28 DIAGNOSIS — N2581 Secondary hyperparathyroidism of renal origin: Secondary | ICD-10-CM | POA: Diagnosis not present

## 2016-03-28 DIAGNOSIS — D509 Iron deficiency anemia, unspecified: Secondary | ICD-10-CM | POA: Diagnosis not present

## 2016-03-31 DIAGNOSIS — Z992 Dependence on renal dialysis: Secondary | ICD-10-CM | POA: Diagnosis not present

## 2016-03-31 DIAGNOSIS — N2581 Secondary hyperparathyroidism of renal origin: Secondary | ICD-10-CM | POA: Diagnosis not present

## 2016-03-31 DIAGNOSIS — D631 Anemia in chronic kidney disease: Secondary | ICD-10-CM | POA: Diagnosis not present

## 2016-03-31 DIAGNOSIS — D509 Iron deficiency anemia, unspecified: Secondary | ICD-10-CM | POA: Diagnosis not present

## 2016-03-31 DIAGNOSIS — N186 End stage renal disease: Secondary | ICD-10-CM | POA: Diagnosis not present

## 2016-04-02 DIAGNOSIS — N2581 Secondary hyperparathyroidism of renal origin: Secondary | ICD-10-CM | POA: Diagnosis not present

## 2016-04-02 DIAGNOSIS — N186 End stage renal disease: Secondary | ICD-10-CM | POA: Diagnosis not present

## 2016-04-02 DIAGNOSIS — Z992 Dependence on renal dialysis: Secondary | ICD-10-CM | POA: Diagnosis not present

## 2016-04-02 DIAGNOSIS — D509 Iron deficiency anemia, unspecified: Secondary | ICD-10-CM | POA: Diagnosis not present

## 2016-04-02 DIAGNOSIS — D631 Anemia in chronic kidney disease: Secondary | ICD-10-CM | POA: Diagnosis not present

## 2016-04-04 DIAGNOSIS — N186 End stage renal disease: Secondary | ICD-10-CM | POA: Diagnosis not present

## 2016-04-04 DIAGNOSIS — D631 Anemia in chronic kidney disease: Secondary | ICD-10-CM | POA: Diagnosis not present

## 2016-04-04 DIAGNOSIS — N2581 Secondary hyperparathyroidism of renal origin: Secondary | ICD-10-CM | POA: Diagnosis not present

## 2016-04-04 DIAGNOSIS — D509 Iron deficiency anemia, unspecified: Secondary | ICD-10-CM | POA: Diagnosis not present

## 2016-04-04 DIAGNOSIS — Z992 Dependence on renal dialysis: Secondary | ICD-10-CM | POA: Diagnosis not present

## 2016-04-07 DIAGNOSIS — D631 Anemia in chronic kidney disease: Secondary | ICD-10-CM | POA: Diagnosis not present

## 2016-04-07 DIAGNOSIS — N2581 Secondary hyperparathyroidism of renal origin: Secondary | ICD-10-CM | POA: Diagnosis not present

## 2016-04-07 DIAGNOSIS — N186 End stage renal disease: Secondary | ICD-10-CM | POA: Diagnosis not present

## 2016-04-07 DIAGNOSIS — Z992 Dependence on renal dialysis: Secondary | ICD-10-CM | POA: Diagnosis not present

## 2016-04-07 DIAGNOSIS — D509 Iron deficiency anemia, unspecified: Secondary | ICD-10-CM | POA: Diagnosis not present

## 2016-04-09 DIAGNOSIS — N186 End stage renal disease: Secondary | ICD-10-CM | POA: Diagnosis not present

## 2016-04-09 DIAGNOSIS — D631 Anemia in chronic kidney disease: Secondary | ICD-10-CM | POA: Diagnosis not present

## 2016-04-09 DIAGNOSIS — N2581 Secondary hyperparathyroidism of renal origin: Secondary | ICD-10-CM | POA: Diagnosis not present

## 2016-04-09 DIAGNOSIS — D509 Iron deficiency anemia, unspecified: Secondary | ICD-10-CM | POA: Diagnosis not present

## 2016-04-09 DIAGNOSIS — Z992 Dependence on renal dialysis: Secondary | ICD-10-CM | POA: Diagnosis not present

## 2016-04-11 DIAGNOSIS — D631 Anemia in chronic kidney disease: Secondary | ICD-10-CM | POA: Diagnosis not present

## 2016-04-11 DIAGNOSIS — D509 Iron deficiency anemia, unspecified: Secondary | ICD-10-CM | POA: Diagnosis not present

## 2016-04-11 DIAGNOSIS — N186 End stage renal disease: Secondary | ICD-10-CM | POA: Diagnosis not present

## 2016-04-11 DIAGNOSIS — Z992 Dependence on renal dialysis: Secondary | ICD-10-CM | POA: Diagnosis not present

## 2016-04-11 DIAGNOSIS — N2581 Secondary hyperparathyroidism of renal origin: Secondary | ICD-10-CM | POA: Diagnosis not present

## 2016-04-14 DIAGNOSIS — N186 End stage renal disease: Secondary | ICD-10-CM | POA: Diagnosis not present

## 2016-04-14 DIAGNOSIS — D631 Anemia in chronic kidney disease: Secondary | ICD-10-CM | POA: Diagnosis not present

## 2016-04-14 DIAGNOSIS — Z992 Dependence on renal dialysis: Secondary | ICD-10-CM | POA: Diagnosis not present

## 2016-04-14 DIAGNOSIS — D509 Iron deficiency anemia, unspecified: Secondary | ICD-10-CM | POA: Diagnosis not present

## 2016-04-14 DIAGNOSIS — N2581 Secondary hyperparathyroidism of renal origin: Secondary | ICD-10-CM | POA: Diagnosis not present

## 2016-04-16 DIAGNOSIS — D509 Iron deficiency anemia, unspecified: Secondary | ICD-10-CM | POA: Diagnosis not present

## 2016-04-16 DIAGNOSIS — Z992 Dependence on renal dialysis: Secondary | ICD-10-CM | POA: Diagnosis not present

## 2016-04-16 DIAGNOSIS — N186 End stage renal disease: Secondary | ICD-10-CM | POA: Diagnosis not present

## 2016-04-16 DIAGNOSIS — D631 Anemia in chronic kidney disease: Secondary | ICD-10-CM | POA: Diagnosis not present

## 2016-04-16 DIAGNOSIS — N2581 Secondary hyperparathyroidism of renal origin: Secondary | ICD-10-CM | POA: Diagnosis not present

## 2016-04-18 DIAGNOSIS — Z992 Dependence on renal dialysis: Secondary | ICD-10-CM | POA: Diagnosis not present

## 2016-04-18 DIAGNOSIS — D509 Iron deficiency anemia, unspecified: Secondary | ICD-10-CM | POA: Diagnosis not present

## 2016-04-18 DIAGNOSIS — N186 End stage renal disease: Secondary | ICD-10-CM | POA: Diagnosis not present

## 2016-04-18 DIAGNOSIS — N2581 Secondary hyperparathyroidism of renal origin: Secondary | ICD-10-CM | POA: Diagnosis not present

## 2016-04-18 DIAGNOSIS — D631 Anemia in chronic kidney disease: Secondary | ICD-10-CM | POA: Diagnosis not present

## 2016-04-21 DIAGNOSIS — D509 Iron deficiency anemia, unspecified: Secondary | ICD-10-CM | POA: Diagnosis not present

## 2016-04-21 DIAGNOSIS — D631 Anemia in chronic kidney disease: Secondary | ICD-10-CM | POA: Diagnosis not present

## 2016-04-21 DIAGNOSIS — Z992 Dependence on renal dialysis: Secondary | ICD-10-CM | POA: Diagnosis not present

## 2016-04-21 DIAGNOSIS — N186 End stage renal disease: Secondary | ICD-10-CM | POA: Diagnosis not present

## 2016-04-23 DIAGNOSIS — D631 Anemia in chronic kidney disease: Secondary | ICD-10-CM | POA: Diagnosis not present

## 2016-04-23 DIAGNOSIS — N186 End stage renal disease: Secondary | ICD-10-CM | POA: Diagnosis not present

## 2016-04-23 DIAGNOSIS — Z992 Dependence on renal dialysis: Secondary | ICD-10-CM | POA: Diagnosis not present

## 2016-04-23 DIAGNOSIS — D509 Iron deficiency anemia, unspecified: Secondary | ICD-10-CM | POA: Diagnosis not present

## 2016-04-24 DIAGNOSIS — Z452 Encounter for adjustment and management of vascular access device: Secondary | ICD-10-CM | POA: Diagnosis not present

## 2016-04-25 DIAGNOSIS — N186 End stage renal disease: Secondary | ICD-10-CM | POA: Diagnosis not present

## 2016-04-25 DIAGNOSIS — D631 Anemia in chronic kidney disease: Secondary | ICD-10-CM | POA: Diagnosis not present

## 2016-04-25 DIAGNOSIS — D509 Iron deficiency anemia, unspecified: Secondary | ICD-10-CM | POA: Diagnosis not present

## 2016-04-25 DIAGNOSIS — Z992 Dependence on renal dialysis: Secondary | ICD-10-CM | POA: Diagnosis not present

## 2016-04-28 DIAGNOSIS — D631 Anemia in chronic kidney disease: Secondary | ICD-10-CM | POA: Diagnosis not present

## 2016-04-28 DIAGNOSIS — N186 End stage renal disease: Secondary | ICD-10-CM | POA: Diagnosis not present

## 2016-04-28 DIAGNOSIS — Z992 Dependence on renal dialysis: Secondary | ICD-10-CM | POA: Diagnosis not present

## 2016-04-28 DIAGNOSIS — D509 Iron deficiency anemia, unspecified: Secondary | ICD-10-CM | POA: Diagnosis not present

## 2016-04-30 DIAGNOSIS — Z992 Dependence on renal dialysis: Secondary | ICD-10-CM | POA: Diagnosis not present

## 2016-04-30 DIAGNOSIS — D509 Iron deficiency anemia, unspecified: Secondary | ICD-10-CM | POA: Diagnosis not present

## 2016-04-30 DIAGNOSIS — D631 Anemia in chronic kidney disease: Secondary | ICD-10-CM | POA: Diagnosis not present

## 2016-04-30 DIAGNOSIS — N186 End stage renal disease: Secondary | ICD-10-CM | POA: Diagnosis not present

## 2016-05-02 DIAGNOSIS — D631 Anemia in chronic kidney disease: Secondary | ICD-10-CM | POA: Diagnosis not present

## 2016-05-02 DIAGNOSIS — N186 End stage renal disease: Secondary | ICD-10-CM | POA: Diagnosis not present

## 2016-05-02 DIAGNOSIS — D509 Iron deficiency anemia, unspecified: Secondary | ICD-10-CM | POA: Diagnosis not present

## 2016-05-02 DIAGNOSIS — Z992 Dependence on renal dialysis: Secondary | ICD-10-CM | POA: Diagnosis not present

## 2016-05-05 DIAGNOSIS — N186 End stage renal disease: Secondary | ICD-10-CM | POA: Diagnosis not present

## 2016-05-05 DIAGNOSIS — D509 Iron deficiency anemia, unspecified: Secondary | ICD-10-CM | POA: Diagnosis not present

## 2016-05-05 DIAGNOSIS — Z992 Dependence on renal dialysis: Secondary | ICD-10-CM | POA: Diagnosis not present

## 2016-05-05 DIAGNOSIS — D631 Anemia in chronic kidney disease: Secondary | ICD-10-CM | POA: Diagnosis not present

## 2016-05-07 DIAGNOSIS — N186 End stage renal disease: Secondary | ICD-10-CM | POA: Diagnosis not present

## 2016-05-07 DIAGNOSIS — D509 Iron deficiency anemia, unspecified: Secondary | ICD-10-CM | POA: Diagnosis not present

## 2016-05-07 DIAGNOSIS — D631 Anemia in chronic kidney disease: Secondary | ICD-10-CM | POA: Diagnosis not present

## 2016-05-07 DIAGNOSIS — Z992 Dependence on renal dialysis: Secondary | ICD-10-CM | POA: Diagnosis not present

## 2016-05-09 DIAGNOSIS — Z992 Dependence on renal dialysis: Secondary | ICD-10-CM | POA: Diagnosis not present

## 2016-05-09 DIAGNOSIS — D631 Anemia in chronic kidney disease: Secondary | ICD-10-CM | POA: Diagnosis not present

## 2016-05-09 DIAGNOSIS — D509 Iron deficiency anemia, unspecified: Secondary | ICD-10-CM | POA: Diagnosis not present

## 2016-05-09 DIAGNOSIS — N186 End stage renal disease: Secondary | ICD-10-CM | POA: Diagnosis not present

## 2016-05-12 DIAGNOSIS — N186 End stage renal disease: Secondary | ICD-10-CM | POA: Diagnosis not present

## 2016-05-12 DIAGNOSIS — Z992 Dependence on renal dialysis: Secondary | ICD-10-CM | POA: Diagnosis not present

## 2016-05-12 DIAGNOSIS — D631 Anemia in chronic kidney disease: Secondary | ICD-10-CM | POA: Diagnosis not present

## 2016-05-12 DIAGNOSIS — D509 Iron deficiency anemia, unspecified: Secondary | ICD-10-CM | POA: Diagnosis not present

## 2016-05-14 DIAGNOSIS — D509 Iron deficiency anemia, unspecified: Secondary | ICD-10-CM | POA: Diagnosis not present

## 2016-05-14 DIAGNOSIS — D631 Anemia in chronic kidney disease: Secondary | ICD-10-CM | POA: Diagnosis not present

## 2016-05-14 DIAGNOSIS — Z992 Dependence on renal dialysis: Secondary | ICD-10-CM | POA: Diagnosis not present

## 2016-05-14 DIAGNOSIS — N186 End stage renal disease: Secondary | ICD-10-CM | POA: Diagnosis not present

## 2016-05-16 DIAGNOSIS — N186 End stage renal disease: Secondary | ICD-10-CM | POA: Diagnosis not present

## 2016-05-16 DIAGNOSIS — D509 Iron deficiency anemia, unspecified: Secondary | ICD-10-CM | POA: Diagnosis not present

## 2016-05-16 DIAGNOSIS — D631 Anemia in chronic kidney disease: Secondary | ICD-10-CM | POA: Diagnosis not present

## 2016-05-16 DIAGNOSIS — Z992 Dependence on renal dialysis: Secondary | ICD-10-CM | POA: Diagnosis not present

## 2016-05-19 DIAGNOSIS — D509 Iron deficiency anemia, unspecified: Secondary | ICD-10-CM | POA: Diagnosis not present

## 2016-05-19 DIAGNOSIS — N186 End stage renal disease: Secondary | ICD-10-CM | POA: Diagnosis not present

## 2016-05-19 DIAGNOSIS — Z992 Dependence on renal dialysis: Secondary | ICD-10-CM | POA: Diagnosis not present

## 2016-05-19 DIAGNOSIS — D631 Anemia in chronic kidney disease: Secondary | ICD-10-CM | POA: Diagnosis not present

## 2016-05-20 DIAGNOSIS — I10 Essential (primary) hypertension: Secondary | ICD-10-CM | POA: Diagnosis not present

## 2016-05-20 DIAGNOSIS — N186 End stage renal disease: Secondary | ICD-10-CM | POA: Diagnosis not present

## 2016-05-20 DIAGNOSIS — M545 Low back pain: Secondary | ICD-10-CM | POA: Diagnosis not present

## 2016-05-21 DIAGNOSIS — N186 End stage renal disease: Secondary | ICD-10-CM | POA: Diagnosis not present

## 2016-05-21 DIAGNOSIS — D631 Anemia in chronic kidney disease: Secondary | ICD-10-CM | POA: Diagnosis not present

## 2016-05-21 DIAGNOSIS — D509 Iron deficiency anemia, unspecified: Secondary | ICD-10-CM | POA: Diagnosis not present

## 2016-05-21 DIAGNOSIS — Z992 Dependence on renal dialysis: Secondary | ICD-10-CM | POA: Diagnosis not present

## 2016-05-23 DIAGNOSIS — D509 Iron deficiency anemia, unspecified: Secondary | ICD-10-CM | POA: Diagnosis not present

## 2016-05-23 DIAGNOSIS — D631 Anemia in chronic kidney disease: Secondary | ICD-10-CM | POA: Diagnosis not present

## 2016-05-23 DIAGNOSIS — N186 End stage renal disease: Secondary | ICD-10-CM | POA: Diagnosis not present

## 2016-05-23 DIAGNOSIS — Z992 Dependence on renal dialysis: Secondary | ICD-10-CM | POA: Diagnosis not present

## 2016-05-26 DIAGNOSIS — N186 End stage renal disease: Secondary | ICD-10-CM | POA: Diagnosis not present

## 2016-05-26 DIAGNOSIS — Z992 Dependence on renal dialysis: Secondary | ICD-10-CM | POA: Diagnosis not present

## 2016-05-26 DIAGNOSIS — D509 Iron deficiency anemia, unspecified: Secondary | ICD-10-CM | POA: Diagnosis not present

## 2016-05-26 DIAGNOSIS — D631 Anemia in chronic kidney disease: Secondary | ICD-10-CM | POA: Diagnosis not present

## 2016-05-28 DIAGNOSIS — N186 End stage renal disease: Secondary | ICD-10-CM | POA: Diagnosis not present

## 2016-05-28 DIAGNOSIS — D631 Anemia in chronic kidney disease: Secondary | ICD-10-CM | POA: Diagnosis not present

## 2016-05-28 DIAGNOSIS — Z992 Dependence on renal dialysis: Secondary | ICD-10-CM | POA: Diagnosis not present

## 2016-05-28 DIAGNOSIS — D509 Iron deficiency anemia, unspecified: Secondary | ICD-10-CM | POA: Diagnosis not present

## 2016-05-29 DIAGNOSIS — Z452 Encounter for adjustment and management of vascular access device: Secondary | ICD-10-CM | POA: Diagnosis not present

## 2016-05-30 DIAGNOSIS — Z992 Dependence on renal dialysis: Secondary | ICD-10-CM | POA: Diagnosis not present

## 2016-05-30 DIAGNOSIS — D509 Iron deficiency anemia, unspecified: Secondary | ICD-10-CM | POA: Diagnosis not present

## 2016-05-30 DIAGNOSIS — N186 End stage renal disease: Secondary | ICD-10-CM | POA: Diagnosis not present

## 2016-05-30 DIAGNOSIS — D631 Anemia in chronic kidney disease: Secondary | ICD-10-CM | POA: Diagnosis not present

## 2016-06-02 DIAGNOSIS — D631 Anemia in chronic kidney disease: Secondary | ICD-10-CM | POA: Diagnosis not present

## 2016-06-02 DIAGNOSIS — N186 End stage renal disease: Secondary | ICD-10-CM | POA: Diagnosis not present

## 2016-06-02 DIAGNOSIS — D509 Iron deficiency anemia, unspecified: Secondary | ICD-10-CM | POA: Diagnosis not present

## 2016-06-02 DIAGNOSIS — Z992 Dependence on renal dialysis: Secondary | ICD-10-CM | POA: Diagnosis not present

## 2016-06-04 DIAGNOSIS — Z992 Dependence on renal dialysis: Secondary | ICD-10-CM | POA: Diagnosis not present

## 2016-06-04 DIAGNOSIS — D509 Iron deficiency anemia, unspecified: Secondary | ICD-10-CM | POA: Diagnosis not present

## 2016-06-04 DIAGNOSIS — N186 End stage renal disease: Secondary | ICD-10-CM | POA: Diagnosis not present

## 2016-06-04 DIAGNOSIS — D631 Anemia in chronic kidney disease: Secondary | ICD-10-CM | POA: Diagnosis not present

## 2016-06-06 DIAGNOSIS — D509 Iron deficiency anemia, unspecified: Secondary | ICD-10-CM | POA: Diagnosis not present

## 2016-06-06 DIAGNOSIS — Z992 Dependence on renal dialysis: Secondary | ICD-10-CM | POA: Diagnosis not present

## 2016-06-06 DIAGNOSIS — N186 End stage renal disease: Secondary | ICD-10-CM | POA: Diagnosis not present

## 2016-06-06 DIAGNOSIS — D631 Anemia in chronic kidney disease: Secondary | ICD-10-CM | POA: Diagnosis not present

## 2016-06-09 DIAGNOSIS — Z992 Dependence on renal dialysis: Secondary | ICD-10-CM | POA: Diagnosis not present

## 2016-06-09 DIAGNOSIS — N186 End stage renal disease: Secondary | ICD-10-CM | POA: Diagnosis not present

## 2016-06-09 DIAGNOSIS — D631 Anemia in chronic kidney disease: Secondary | ICD-10-CM | POA: Diagnosis not present

## 2016-06-09 DIAGNOSIS — D509 Iron deficiency anemia, unspecified: Secondary | ICD-10-CM | POA: Diagnosis not present

## 2016-06-11 DIAGNOSIS — Z992 Dependence on renal dialysis: Secondary | ICD-10-CM | POA: Diagnosis not present

## 2016-06-11 DIAGNOSIS — D631 Anemia in chronic kidney disease: Secondary | ICD-10-CM | POA: Diagnosis not present

## 2016-06-11 DIAGNOSIS — D509 Iron deficiency anemia, unspecified: Secondary | ICD-10-CM | POA: Diagnosis not present

## 2016-06-11 DIAGNOSIS — N186 End stage renal disease: Secondary | ICD-10-CM | POA: Diagnosis not present

## 2016-06-13 DIAGNOSIS — D509 Iron deficiency anemia, unspecified: Secondary | ICD-10-CM | POA: Diagnosis not present

## 2016-06-13 DIAGNOSIS — Z992 Dependence on renal dialysis: Secondary | ICD-10-CM | POA: Diagnosis not present

## 2016-06-13 DIAGNOSIS — N186 End stage renal disease: Secondary | ICD-10-CM | POA: Diagnosis not present

## 2016-06-13 DIAGNOSIS — D631 Anemia in chronic kidney disease: Secondary | ICD-10-CM | POA: Diagnosis not present

## 2016-06-16 DIAGNOSIS — D631 Anemia in chronic kidney disease: Secondary | ICD-10-CM | POA: Diagnosis not present

## 2016-06-16 DIAGNOSIS — N186 End stage renal disease: Secondary | ICD-10-CM | POA: Diagnosis not present

## 2016-06-16 DIAGNOSIS — Z992 Dependence on renal dialysis: Secondary | ICD-10-CM | POA: Diagnosis not present

## 2016-06-16 DIAGNOSIS — D509 Iron deficiency anemia, unspecified: Secondary | ICD-10-CM | POA: Diagnosis not present

## 2016-06-18 DIAGNOSIS — N186 End stage renal disease: Secondary | ICD-10-CM | POA: Diagnosis not present

## 2016-06-18 DIAGNOSIS — Z992 Dependence on renal dialysis: Secondary | ICD-10-CM | POA: Diagnosis not present

## 2016-06-18 DIAGNOSIS — D631 Anemia in chronic kidney disease: Secondary | ICD-10-CM | POA: Diagnosis not present

## 2016-06-18 DIAGNOSIS — D509 Iron deficiency anemia, unspecified: Secondary | ICD-10-CM | POA: Diagnosis not present

## 2016-06-19 DIAGNOSIS — N186 End stage renal disease: Secondary | ICD-10-CM | POA: Diagnosis not present

## 2016-06-19 DIAGNOSIS — Z992 Dependence on renal dialysis: Secondary | ICD-10-CM | POA: Diagnosis not present

## 2016-06-20 DIAGNOSIS — D509 Iron deficiency anemia, unspecified: Secondary | ICD-10-CM | POA: Diagnosis not present

## 2016-06-20 DIAGNOSIS — N186 End stage renal disease: Secondary | ICD-10-CM | POA: Diagnosis not present

## 2016-06-20 DIAGNOSIS — Z992 Dependence on renal dialysis: Secondary | ICD-10-CM | POA: Diagnosis not present

## 2016-06-20 DIAGNOSIS — D631 Anemia in chronic kidney disease: Secondary | ICD-10-CM | POA: Diagnosis not present

## 2016-06-20 DIAGNOSIS — N2581 Secondary hyperparathyroidism of renal origin: Secondary | ICD-10-CM | POA: Diagnosis not present

## 2016-06-20 DIAGNOSIS — Z23 Encounter for immunization: Secondary | ICD-10-CM | POA: Diagnosis not present

## 2016-06-23 DIAGNOSIS — Z23 Encounter for immunization: Secondary | ICD-10-CM | POA: Diagnosis not present

## 2016-06-23 DIAGNOSIS — D509 Iron deficiency anemia, unspecified: Secondary | ICD-10-CM | POA: Diagnosis not present

## 2016-06-23 DIAGNOSIS — D631 Anemia in chronic kidney disease: Secondary | ICD-10-CM | POA: Diagnosis not present

## 2016-06-23 DIAGNOSIS — N186 End stage renal disease: Secondary | ICD-10-CM | POA: Diagnosis not present

## 2016-06-23 DIAGNOSIS — Z992 Dependence on renal dialysis: Secondary | ICD-10-CM | POA: Diagnosis not present

## 2016-06-23 DIAGNOSIS — N2581 Secondary hyperparathyroidism of renal origin: Secondary | ICD-10-CM | POA: Diagnosis not present

## 2016-06-24 DIAGNOSIS — Z452 Encounter for adjustment and management of vascular access device: Secondary | ICD-10-CM | POA: Diagnosis not present

## 2016-06-24 DIAGNOSIS — M545 Low back pain: Secondary | ICD-10-CM | POA: Diagnosis not present

## 2016-06-24 DIAGNOSIS — N186 End stage renal disease: Secondary | ICD-10-CM | POA: Diagnosis not present

## 2016-06-24 DIAGNOSIS — I10 Essential (primary) hypertension: Secondary | ICD-10-CM | POA: Diagnosis not present

## 2016-06-25 DIAGNOSIS — N186 End stage renal disease: Secondary | ICD-10-CM | POA: Diagnosis not present

## 2016-06-25 DIAGNOSIS — Z23 Encounter for immunization: Secondary | ICD-10-CM | POA: Diagnosis not present

## 2016-06-25 DIAGNOSIS — Z992 Dependence on renal dialysis: Secondary | ICD-10-CM | POA: Diagnosis not present

## 2016-06-25 DIAGNOSIS — N2581 Secondary hyperparathyroidism of renal origin: Secondary | ICD-10-CM | POA: Diagnosis not present

## 2016-06-25 DIAGNOSIS — D631 Anemia in chronic kidney disease: Secondary | ICD-10-CM | POA: Diagnosis not present

## 2016-06-25 DIAGNOSIS — D509 Iron deficiency anemia, unspecified: Secondary | ICD-10-CM | POA: Diagnosis not present

## 2016-06-26 DIAGNOSIS — I481 Persistent atrial fibrillation: Secondary | ICD-10-CM | POA: Diagnosis not present

## 2016-06-26 DIAGNOSIS — M545 Low back pain: Secondary | ICD-10-CM | POA: Diagnosis not present

## 2016-06-26 DIAGNOSIS — J440 Chronic obstructive pulmonary disease with acute lower respiratory infection: Secondary | ICD-10-CM | POA: Diagnosis not present

## 2016-06-26 DIAGNOSIS — I358 Other nonrheumatic aortic valve disorders: Secondary | ICD-10-CM | POA: Diagnosis not present

## 2016-06-27 DIAGNOSIS — Z992 Dependence on renal dialysis: Secondary | ICD-10-CM | POA: Diagnosis not present

## 2016-06-27 DIAGNOSIS — D631 Anemia in chronic kidney disease: Secondary | ICD-10-CM | POA: Diagnosis not present

## 2016-06-27 DIAGNOSIS — Z23 Encounter for immunization: Secondary | ICD-10-CM | POA: Diagnosis not present

## 2016-06-27 DIAGNOSIS — N2581 Secondary hyperparathyroidism of renal origin: Secondary | ICD-10-CM | POA: Diagnosis not present

## 2016-06-27 DIAGNOSIS — N186 End stage renal disease: Secondary | ICD-10-CM | POA: Diagnosis not present

## 2016-06-27 DIAGNOSIS — D509 Iron deficiency anemia, unspecified: Secondary | ICD-10-CM | POA: Diagnosis not present

## 2016-06-30 DIAGNOSIS — N186 End stage renal disease: Secondary | ICD-10-CM | POA: Diagnosis not present

## 2016-06-30 DIAGNOSIS — N2581 Secondary hyperparathyroidism of renal origin: Secondary | ICD-10-CM | POA: Diagnosis not present

## 2016-06-30 DIAGNOSIS — Z992 Dependence on renal dialysis: Secondary | ICD-10-CM | POA: Diagnosis not present

## 2016-06-30 DIAGNOSIS — D509 Iron deficiency anemia, unspecified: Secondary | ICD-10-CM | POA: Diagnosis not present

## 2016-06-30 DIAGNOSIS — D631 Anemia in chronic kidney disease: Secondary | ICD-10-CM | POA: Diagnosis not present

## 2016-06-30 DIAGNOSIS — Z23 Encounter for immunization: Secondary | ICD-10-CM | POA: Diagnosis not present

## 2016-07-02 DIAGNOSIS — N186 End stage renal disease: Secondary | ICD-10-CM | POA: Diagnosis not present

## 2016-07-02 DIAGNOSIS — D509 Iron deficiency anemia, unspecified: Secondary | ICD-10-CM | POA: Diagnosis not present

## 2016-07-02 DIAGNOSIS — Z23 Encounter for immunization: Secondary | ICD-10-CM | POA: Diagnosis not present

## 2016-07-02 DIAGNOSIS — N2581 Secondary hyperparathyroidism of renal origin: Secondary | ICD-10-CM | POA: Diagnosis not present

## 2016-07-02 DIAGNOSIS — D631 Anemia in chronic kidney disease: Secondary | ICD-10-CM | POA: Diagnosis not present

## 2016-07-02 DIAGNOSIS — Z992 Dependence on renal dialysis: Secondary | ICD-10-CM | POA: Diagnosis not present

## 2016-07-04 DIAGNOSIS — Z23 Encounter for immunization: Secondary | ICD-10-CM | POA: Diagnosis not present

## 2016-07-04 DIAGNOSIS — D631 Anemia in chronic kidney disease: Secondary | ICD-10-CM | POA: Diagnosis not present

## 2016-07-04 DIAGNOSIS — N186 End stage renal disease: Secondary | ICD-10-CM | POA: Diagnosis not present

## 2016-07-04 DIAGNOSIS — Z992 Dependence on renal dialysis: Secondary | ICD-10-CM | POA: Diagnosis not present

## 2016-07-04 DIAGNOSIS — D509 Iron deficiency anemia, unspecified: Secondary | ICD-10-CM | POA: Diagnosis not present

## 2016-07-04 DIAGNOSIS — N2581 Secondary hyperparathyroidism of renal origin: Secondary | ICD-10-CM | POA: Diagnosis not present

## 2016-07-07 DIAGNOSIS — D631 Anemia in chronic kidney disease: Secondary | ICD-10-CM | POA: Diagnosis not present

## 2016-07-07 DIAGNOSIS — Z23 Encounter for immunization: Secondary | ICD-10-CM | POA: Diagnosis not present

## 2016-07-07 DIAGNOSIS — Z992 Dependence on renal dialysis: Secondary | ICD-10-CM | POA: Diagnosis not present

## 2016-07-07 DIAGNOSIS — N186 End stage renal disease: Secondary | ICD-10-CM | POA: Diagnosis not present

## 2016-07-07 DIAGNOSIS — N2581 Secondary hyperparathyroidism of renal origin: Secondary | ICD-10-CM | POA: Diagnosis not present

## 2016-07-07 DIAGNOSIS — D509 Iron deficiency anemia, unspecified: Secondary | ICD-10-CM | POA: Diagnosis not present

## 2016-07-09 DIAGNOSIS — D631 Anemia in chronic kidney disease: Secondary | ICD-10-CM | POA: Diagnosis not present

## 2016-07-09 DIAGNOSIS — N186 End stage renal disease: Secondary | ICD-10-CM | POA: Diagnosis not present

## 2016-07-09 DIAGNOSIS — D509 Iron deficiency anemia, unspecified: Secondary | ICD-10-CM | POA: Diagnosis not present

## 2016-07-09 DIAGNOSIS — Z992 Dependence on renal dialysis: Secondary | ICD-10-CM | POA: Diagnosis not present

## 2016-07-09 DIAGNOSIS — N2581 Secondary hyperparathyroidism of renal origin: Secondary | ICD-10-CM | POA: Diagnosis not present

## 2016-07-09 DIAGNOSIS — Z23 Encounter for immunization: Secondary | ICD-10-CM | POA: Diagnosis not present

## 2016-07-11 DIAGNOSIS — Z992 Dependence on renal dialysis: Secondary | ICD-10-CM | POA: Diagnosis not present

## 2016-07-11 DIAGNOSIS — N2581 Secondary hyperparathyroidism of renal origin: Secondary | ICD-10-CM | POA: Diagnosis not present

## 2016-07-11 DIAGNOSIS — D631 Anemia in chronic kidney disease: Secondary | ICD-10-CM | POA: Diagnosis not present

## 2016-07-11 DIAGNOSIS — Z23 Encounter for immunization: Secondary | ICD-10-CM | POA: Diagnosis not present

## 2016-07-11 DIAGNOSIS — D509 Iron deficiency anemia, unspecified: Secondary | ICD-10-CM | POA: Diagnosis not present

## 2016-07-11 DIAGNOSIS — N186 End stage renal disease: Secondary | ICD-10-CM | POA: Diagnosis not present

## 2016-07-14 DIAGNOSIS — N2581 Secondary hyperparathyroidism of renal origin: Secondary | ICD-10-CM | POA: Diagnosis not present

## 2016-07-14 DIAGNOSIS — Z23 Encounter for immunization: Secondary | ICD-10-CM | POA: Diagnosis not present

## 2016-07-14 DIAGNOSIS — D631 Anemia in chronic kidney disease: Secondary | ICD-10-CM | POA: Diagnosis not present

## 2016-07-14 DIAGNOSIS — N186 End stage renal disease: Secondary | ICD-10-CM | POA: Diagnosis not present

## 2016-07-14 DIAGNOSIS — D509 Iron deficiency anemia, unspecified: Secondary | ICD-10-CM | POA: Diagnosis not present

## 2016-07-14 DIAGNOSIS — Z992 Dependence on renal dialysis: Secondary | ICD-10-CM | POA: Diagnosis not present

## 2016-07-16 DIAGNOSIS — Z79891 Long term (current) use of opiate analgesic: Secondary | ICD-10-CM | POA: Diagnosis not present

## 2016-07-16 DIAGNOSIS — D689 Coagulation defect, unspecified: Secondary | ICD-10-CM | POA: Diagnosis not present

## 2016-07-16 DIAGNOSIS — W228XXA Striking against or struck by other objects, initial encounter: Secondary | ICD-10-CM | POA: Diagnosis not present

## 2016-07-16 DIAGNOSIS — Z7952 Long term (current) use of systemic steroids: Secondary | ICD-10-CM | POA: Diagnosis not present

## 2016-07-16 DIAGNOSIS — Z952 Presence of prosthetic heart valve: Secondary | ICD-10-CM | POA: Diagnosis not present

## 2016-07-16 DIAGNOSIS — Z23 Encounter for immunization: Secondary | ICD-10-CM | POA: Diagnosis not present

## 2016-07-16 DIAGNOSIS — I5042 Chronic combined systolic (congestive) and diastolic (congestive) heart failure: Secondary | ICD-10-CM | POA: Diagnosis not present

## 2016-07-16 DIAGNOSIS — I132 Hypertensive heart and chronic kidney disease with heart failure and with stage 5 chronic kidney disease, or end stage renal disease: Secondary | ICD-10-CM | POA: Diagnosis not present

## 2016-07-16 DIAGNOSIS — S41109A Unspecified open wound of unspecified upper arm, initial encounter: Secondary | ICD-10-CM | POA: Diagnosis not present

## 2016-07-16 DIAGNOSIS — Z992 Dependence on renal dialysis: Secondary | ICD-10-CM | POA: Diagnosis not present

## 2016-07-16 DIAGNOSIS — Z7901 Long term (current) use of anticoagulants: Secondary | ICD-10-CM | POA: Diagnosis not present

## 2016-07-16 DIAGNOSIS — E1122 Type 2 diabetes mellitus with diabetic chronic kidney disease: Secondary | ICD-10-CM | POA: Diagnosis not present

## 2016-07-16 DIAGNOSIS — E1151 Type 2 diabetes mellitus with diabetic peripheral angiopathy without gangrene: Secondary | ICD-10-CM | POA: Diagnosis not present

## 2016-07-16 DIAGNOSIS — Z79899 Other long term (current) drug therapy: Secondary | ICD-10-CM | POA: Diagnosis not present

## 2016-07-16 DIAGNOSIS — I10 Essential (primary) hypertension: Secondary | ICD-10-CM | POA: Diagnosis not present

## 2016-07-16 DIAGNOSIS — S51812A Laceration without foreign body of left forearm, initial encounter: Secondary | ICD-10-CM | POA: Diagnosis not present

## 2016-07-16 DIAGNOSIS — N2581 Secondary hyperparathyroidism of renal origin: Secondary | ICD-10-CM | POA: Diagnosis not present

## 2016-07-16 DIAGNOSIS — D631 Anemia in chronic kidney disease: Secondary | ICD-10-CM | POA: Diagnosis not present

## 2016-07-16 DIAGNOSIS — D509 Iron deficiency anemia, unspecified: Secondary | ICD-10-CM | POA: Diagnosis not present

## 2016-07-16 DIAGNOSIS — N186 End stage renal disease: Secondary | ICD-10-CM | POA: Diagnosis not present

## 2016-07-16 DIAGNOSIS — S5012XA Contusion of left forearm, initial encounter: Secondary | ICD-10-CM | POA: Diagnosis not present

## 2016-07-16 DIAGNOSIS — M545 Low back pain: Secondary | ICD-10-CM | POA: Diagnosis not present

## 2016-07-16 DIAGNOSIS — G8929 Other chronic pain: Secondary | ICD-10-CM | POA: Diagnosis not present

## 2016-07-16 DIAGNOSIS — R030 Elevated blood-pressure reading, without diagnosis of hypertension: Secondary | ICD-10-CM | POA: Diagnosis not present

## 2016-07-16 DIAGNOSIS — E271 Primary adrenocortical insufficiency: Secondary | ICD-10-CM | POA: Diagnosis not present

## 2016-07-16 DIAGNOSIS — D6832 Hemorrhagic disorder due to extrinsic circulating anticoagulants: Secondary | ICD-10-CM | POA: Diagnosis not present

## 2016-07-16 DIAGNOSIS — J449 Chronic obstructive pulmonary disease, unspecified: Secondary | ICD-10-CM | POA: Diagnosis not present

## 2016-07-16 DIAGNOSIS — D649 Anemia, unspecified: Secondary | ICD-10-CM | POA: Diagnosis not present

## 2016-07-17 DIAGNOSIS — I5042 Chronic combined systolic (congestive) and diastolic (congestive) heart failure: Secondary | ICD-10-CM | POA: Diagnosis not present

## 2016-07-17 DIAGNOSIS — S5012XA Contusion of left forearm, initial encounter: Secondary | ICD-10-CM | POA: Diagnosis not present

## 2016-07-17 DIAGNOSIS — D6832 Hemorrhagic disorder due to extrinsic circulating anticoagulants: Secondary | ICD-10-CM | POA: Diagnosis not present

## 2016-07-17 DIAGNOSIS — I132 Hypertensive heart and chronic kidney disease with heart failure and with stage 5 chronic kidney disease, or end stage renal disease: Secondary | ICD-10-CM | POA: Diagnosis not present

## 2016-07-17 DIAGNOSIS — W228XXA Striking against or struck by other objects, initial encounter: Secondary | ICD-10-CM | POA: Diagnosis not present

## 2016-07-18 DIAGNOSIS — N186 End stage renal disease: Secondary | ICD-10-CM | POA: Diagnosis not present

## 2016-07-18 DIAGNOSIS — D631 Anemia in chronic kidney disease: Secondary | ICD-10-CM | POA: Diagnosis not present

## 2016-07-18 DIAGNOSIS — D509 Iron deficiency anemia, unspecified: Secondary | ICD-10-CM | POA: Diagnosis not present

## 2016-07-18 DIAGNOSIS — Z23 Encounter for immunization: Secondary | ICD-10-CM | POA: Diagnosis not present

## 2016-07-18 DIAGNOSIS — N2581 Secondary hyperparathyroidism of renal origin: Secondary | ICD-10-CM | POA: Diagnosis not present

## 2016-07-18 DIAGNOSIS — Z992 Dependence on renal dialysis: Secondary | ICD-10-CM | POA: Diagnosis not present

## 2016-07-19 DIAGNOSIS — Z992 Dependence on renal dialysis: Secondary | ICD-10-CM | POA: Diagnosis not present

## 2016-07-19 DIAGNOSIS — N186 End stage renal disease: Secondary | ICD-10-CM | POA: Diagnosis not present

## 2016-07-21 DIAGNOSIS — D631 Anemia in chronic kidney disease: Secondary | ICD-10-CM | POA: Diagnosis not present

## 2016-07-21 DIAGNOSIS — N186 End stage renal disease: Secondary | ICD-10-CM | POA: Diagnosis not present

## 2016-07-21 DIAGNOSIS — D509 Iron deficiency anemia, unspecified: Secondary | ICD-10-CM | POA: Diagnosis not present

## 2016-07-21 DIAGNOSIS — Z992 Dependence on renal dialysis: Secondary | ICD-10-CM | POA: Diagnosis not present

## 2016-07-23 DIAGNOSIS — N186 End stage renal disease: Secondary | ICD-10-CM | POA: Diagnosis not present

## 2016-07-23 DIAGNOSIS — D631 Anemia in chronic kidney disease: Secondary | ICD-10-CM | POA: Diagnosis not present

## 2016-07-23 DIAGNOSIS — Z992 Dependence on renal dialysis: Secondary | ICD-10-CM | POA: Diagnosis not present

## 2016-07-23 DIAGNOSIS — D509 Iron deficiency anemia, unspecified: Secondary | ICD-10-CM | POA: Diagnosis not present

## 2016-07-25 DIAGNOSIS — D631 Anemia in chronic kidney disease: Secondary | ICD-10-CM | POA: Diagnosis not present

## 2016-07-25 DIAGNOSIS — Z992 Dependence on renal dialysis: Secondary | ICD-10-CM | POA: Diagnosis not present

## 2016-07-25 DIAGNOSIS — N186 End stage renal disease: Secondary | ICD-10-CM | POA: Diagnosis not present

## 2016-07-25 DIAGNOSIS — D509 Iron deficiency anemia, unspecified: Secondary | ICD-10-CM | POA: Diagnosis not present

## 2016-07-28 DIAGNOSIS — D509 Iron deficiency anemia, unspecified: Secondary | ICD-10-CM | POA: Diagnosis not present

## 2016-07-28 DIAGNOSIS — Z992 Dependence on renal dialysis: Secondary | ICD-10-CM | POA: Diagnosis not present

## 2016-07-28 DIAGNOSIS — N186 End stage renal disease: Secondary | ICD-10-CM | POA: Diagnosis not present

## 2016-07-28 DIAGNOSIS — D631 Anemia in chronic kidney disease: Secondary | ICD-10-CM | POA: Diagnosis not present

## 2016-07-30 DIAGNOSIS — N186 End stage renal disease: Secondary | ICD-10-CM | POA: Diagnosis not present

## 2016-07-30 DIAGNOSIS — Z992 Dependence on renal dialysis: Secondary | ICD-10-CM | POA: Diagnosis not present

## 2016-07-30 DIAGNOSIS — D631 Anemia in chronic kidney disease: Secondary | ICD-10-CM | POA: Diagnosis not present

## 2016-07-30 DIAGNOSIS — D509 Iron deficiency anemia, unspecified: Secondary | ICD-10-CM | POA: Diagnosis not present

## 2016-08-01 DIAGNOSIS — N186 End stage renal disease: Secondary | ICD-10-CM | POA: Diagnosis not present

## 2016-08-01 DIAGNOSIS — Z992 Dependence on renal dialysis: Secondary | ICD-10-CM | POA: Diagnosis not present

## 2016-08-01 DIAGNOSIS — D509 Iron deficiency anemia, unspecified: Secondary | ICD-10-CM | POA: Diagnosis not present

## 2016-08-01 DIAGNOSIS — D631 Anemia in chronic kidney disease: Secondary | ICD-10-CM | POA: Diagnosis not present

## 2016-08-04 DIAGNOSIS — D631 Anemia in chronic kidney disease: Secondary | ICD-10-CM | POA: Diagnosis not present

## 2016-08-04 DIAGNOSIS — Z992 Dependence on renal dialysis: Secondary | ICD-10-CM | POA: Diagnosis not present

## 2016-08-04 DIAGNOSIS — D509 Iron deficiency anemia, unspecified: Secondary | ICD-10-CM | POA: Diagnosis not present

## 2016-08-04 DIAGNOSIS — N186 End stage renal disease: Secondary | ICD-10-CM | POA: Diagnosis not present

## 2016-08-06 DIAGNOSIS — Z992 Dependence on renal dialysis: Secondary | ICD-10-CM | POA: Diagnosis not present

## 2016-08-06 DIAGNOSIS — D631 Anemia in chronic kidney disease: Secondary | ICD-10-CM | POA: Diagnosis not present

## 2016-08-06 DIAGNOSIS — N186 End stage renal disease: Secondary | ICD-10-CM | POA: Diagnosis not present

## 2016-08-06 DIAGNOSIS — D509 Iron deficiency anemia, unspecified: Secondary | ICD-10-CM | POA: Diagnosis not present

## 2016-08-08 DIAGNOSIS — D631 Anemia in chronic kidney disease: Secondary | ICD-10-CM | POA: Diagnosis not present

## 2016-08-08 DIAGNOSIS — D509 Iron deficiency anemia, unspecified: Secondary | ICD-10-CM | POA: Diagnosis not present

## 2016-08-08 DIAGNOSIS — Z992 Dependence on renal dialysis: Secondary | ICD-10-CM | POA: Diagnosis not present

## 2016-08-08 DIAGNOSIS — N186 End stage renal disease: Secondary | ICD-10-CM | POA: Diagnosis not present

## 2016-08-11 DIAGNOSIS — N186 End stage renal disease: Secondary | ICD-10-CM | POA: Diagnosis not present

## 2016-08-11 DIAGNOSIS — D509 Iron deficiency anemia, unspecified: Secondary | ICD-10-CM | POA: Diagnosis not present

## 2016-08-11 DIAGNOSIS — Z992 Dependence on renal dialysis: Secondary | ICD-10-CM | POA: Diagnosis not present

## 2016-08-11 DIAGNOSIS — D631 Anemia in chronic kidney disease: Secondary | ICD-10-CM | POA: Diagnosis not present

## 2016-08-13 DIAGNOSIS — D631 Anemia in chronic kidney disease: Secondary | ICD-10-CM | POA: Diagnosis not present

## 2016-08-13 DIAGNOSIS — Z992 Dependence on renal dialysis: Secondary | ICD-10-CM | POA: Diagnosis not present

## 2016-08-13 DIAGNOSIS — N186 End stage renal disease: Secondary | ICD-10-CM | POA: Diagnosis not present

## 2016-08-13 DIAGNOSIS — D509 Iron deficiency anemia, unspecified: Secondary | ICD-10-CM | POA: Diagnosis not present

## 2016-08-15 DIAGNOSIS — D509 Iron deficiency anemia, unspecified: Secondary | ICD-10-CM | POA: Diagnosis not present

## 2016-08-15 DIAGNOSIS — N186 End stage renal disease: Secondary | ICD-10-CM | POA: Diagnosis not present

## 2016-08-15 DIAGNOSIS — Z992 Dependence on renal dialysis: Secondary | ICD-10-CM | POA: Diagnosis not present

## 2016-08-15 DIAGNOSIS — D631 Anemia in chronic kidney disease: Secondary | ICD-10-CM | POA: Diagnosis not present

## 2016-08-18 DIAGNOSIS — D509 Iron deficiency anemia, unspecified: Secondary | ICD-10-CM | POA: Diagnosis not present

## 2016-08-18 DIAGNOSIS — Z992 Dependence on renal dialysis: Secondary | ICD-10-CM | POA: Diagnosis not present

## 2016-08-18 DIAGNOSIS — N186 End stage renal disease: Secondary | ICD-10-CM | POA: Diagnosis not present

## 2016-08-18 DIAGNOSIS — D631 Anemia in chronic kidney disease: Secondary | ICD-10-CM | POA: Diagnosis not present

## 2016-08-19 DIAGNOSIS — Z992 Dependence on renal dialysis: Secondary | ICD-10-CM | POA: Diagnosis not present

## 2016-08-19 DIAGNOSIS — Z452 Encounter for adjustment and management of vascular access device: Secondary | ICD-10-CM | POA: Diagnosis not present

## 2016-08-19 DIAGNOSIS — N186 End stage renal disease: Secondary | ICD-10-CM | POA: Diagnosis not present

## 2016-08-20 DIAGNOSIS — D631 Anemia in chronic kidney disease: Secondary | ICD-10-CM | POA: Diagnosis not present

## 2016-08-20 DIAGNOSIS — D509 Iron deficiency anemia, unspecified: Secondary | ICD-10-CM | POA: Diagnosis not present

## 2016-08-20 DIAGNOSIS — Z992 Dependence on renal dialysis: Secondary | ICD-10-CM | POA: Diagnosis not present

## 2016-08-20 DIAGNOSIS — N186 End stage renal disease: Secondary | ICD-10-CM | POA: Diagnosis not present

## 2016-08-21 DIAGNOSIS — I10 Essential (primary) hypertension: Secondary | ICD-10-CM | POA: Diagnosis not present

## 2016-08-21 DIAGNOSIS — N186 End stage renal disease: Secondary | ICD-10-CM | POA: Diagnosis not present

## 2016-08-21 DIAGNOSIS — M545 Low back pain: Secondary | ICD-10-CM | POA: Diagnosis not present

## 2016-08-22 DIAGNOSIS — N186 End stage renal disease: Secondary | ICD-10-CM | POA: Diagnosis not present

## 2016-08-22 DIAGNOSIS — Z992 Dependence on renal dialysis: Secondary | ICD-10-CM | POA: Diagnosis not present

## 2016-08-22 DIAGNOSIS — D509 Iron deficiency anemia, unspecified: Secondary | ICD-10-CM | POA: Diagnosis not present

## 2016-08-22 DIAGNOSIS — D631 Anemia in chronic kidney disease: Secondary | ICD-10-CM | POA: Diagnosis not present

## 2016-08-25 DIAGNOSIS — Z992 Dependence on renal dialysis: Secondary | ICD-10-CM | POA: Diagnosis not present

## 2016-08-25 DIAGNOSIS — N186 End stage renal disease: Secondary | ICD-10-CM | POA: Diagnosis not present

## 2016-08-25 DIAGNOSIS — D509 Iron deficiency anemia, unspecified: Secondary | ICD-10-CM | POA: Diagnosis not present

## 2016-08-25 DIAGNOSIS — D631 Anemia in chronic kidney disease: Secondary | ICD-10-CM | POA: Diagnosis not present

## 2016-08-27 DIAGNOSIS — N186 End stage renal disease: Secondary | ICD-10-CM | POA: Diagnosis not present

## 2016-08-27 DIAGNOSIS — D631 Anemia in chronic kidney disease: Secondary | ICD-10-CM | POA: Diagnosis not present

## 2016-08-27 DIAGNOSIS — Z992 Dependence on renal dialysis: Secondary | ICD-10-CM | POA: Diagnosis not present

## 2016-08-27 DIAGNOSIS — D509 Iron deficiency anemia, unspecified: Secondary | ICD-10-CM | POA: Diagnosis not present

## 2016-08-29 DIAGNOSIS — D509 Iron deficiency anemia, unspecified: Secondary | ICD-10-CM | POA: Diagnosis not present

## 2016-08-29 DIAGNOSIS — Z992 Dependence on renal dialysis: Secondary | ICD-10-CM | POA: Diagnosis not present

## 2016-08-29 DIAGNOSIS — D631 Anemia in chronic kidney disease: Secondary | ICD-10-CM | POA: Diagnosis not present

## 2016-08-29 DIAGNOSIS — N186 End stage renal disease: Secondary | ICD-10-CM | POA: Diagnosis not present

## 2016-09-01 DIAGNOSIS — I132 Hypertensive heart and chronic kidney disease with heart failure and with stage 5 chronic kidney disease, or end stage renal disease: Secondary | ICD-10-CM | POA: Diagnosis present

## 2016-09-01 DIAGNOSIS — E271 Primary adrenocortical insufficiency: Secondary | ICD-10-CM | POA: Diagnosis not present

## 2016-09-01 DIAGNOSIS — I214 Non-ST elevation (NSTEMI) myocardial infarction: Secondary | ICD-10-CM | POA: Diagnosis present

## 2016-09-01 DIAGNOSIS — J189 Pneumonia, unspecified organism: Secondary | ICD-10-CM | POA: Diagnosis present

## 2016-09-01 DIAGNOSIS — Z992 Dependence on renal dialysis: Secondary | ICD-10-CM | POA: Diagnosis not present

## 2016-09-01 DIAGNOSIS — Z89511 Acquired absence of right leg below knee: Secondary | ICD-10-CM | POA: Diagnosis not present

## 2016-09-01 DIAGNOSIS — N186 End stage renal disease: Secondary | ICD-10-CM | POA: Diagnosis present

## 2016-09-01 DIAGNOSIS — I482 Chronic atrial fibrillation: Secondary | ICD-10-CM | POA: Diagnosis not present

## 2016-09-01 DIAGNOSIS — E875 Hyperkalemia: Secondary | ICD-10-CM | POA: Diagnosis present

## 2016-09-01 DIAGNOSIS — I5043 Acute on chronic combined systolic (congestive) and diastolic (congestive) heart failure: Secondary | ICD-10-CM | POA: Diagnosis present

## 2016-09-01 DIAGNOSIS — G8929 Other chronic pain: Secondary | ICD-10-CM | POA: Diagnosis present

## 2016-09-01 DIAGNOSIS — I509 Heart failure, unspecified: Secondary | ICD-10-CM | POA: Diagnosis not present

## 2016-09-01 DIAGNOSIS — E1122 Type 2 diabetes mellitus with diabetic chronic kidney disease: Secondary | ICD-10-CM | POA: Diagnosis present

## 2016-09-01 DIAGNOSIS — J449 Chronic obstructive pulmonary disease, unspecified: Secondary | ICD-10-CM | POA: Diagnosis present

## 2016-09-01 DIAGNOSIS — D631 Anemia in chronic kidney disease: Secondary | ICD-10-CM | POA: Diagnosis not present

## 2016-09-01 DIAGNOSIS — J90 Pleural effusion, not elsewhere classified: Secondary | ICD-10-CM | POA: Diagnosis not present

## 2016-09-01 DIAGNOSIS — D509 Iron deficiency anemia, unspecified: Secondary | ICD-10-CM | POA: Diagnosis not present

## 2016-09-01 DIAGNOSIS — R0602 Shortness of breath: Secondary | ICD-10-CM | POA: Diagnosis not present

## 2016-09-01 DIAGNOSIS — Z89512 Acquired absence of left leg below knee: Secondary | ICD-10-CM | POA: Diagnosis not present

## 2016-09-01 DIAGNOSIS — R0603 Acute respiratory distress: Secondary | ICD-10-CM | POA: Diagnosis not present

## 2016-09-01 DIAGNOSIS — Z8489 Family history of other specified conditions: Secondary | ICD-10-CM | POA: Diagnosis not present

## 2016-09-01 DIAGNOSIS — I352 Nonrheumatic aortic (valve) stenosis with insufficiency: Secondary | ICD-10-CM | POA: Diagnosis present

## 2016-09-01 DIAGNOSIS — M545 Low back pain: Secondary | ICD-10-CM | POA: Diagnosis present

## 2016-09-01 DIAGNOSIS — J168 Pneumonia due to other specified infectious organisms: Secondary | ICD-10-CM | POA: Diagnosis not present

## 2016-09-01 DIAGNOSIS — I350 Nonrheumatic aortic (valve) stenosis: Secondary | ICD-10-CM | POA: Diagnosis present

## 2016-09-01 DIAGNOSIS — Z952 Presence of prosthetic heart valve: Secondary | ICD-10-CM | POA: Diagnosis not present

## 2016-09-01 DIAGNOSIS — Z836 Family history of other diseases of the respiratory system: Secondary | ICD-10-CM | POA: Diagnosis not present

## 2016-09-01 DIAGNOSIS — R0682 Tachypnea, not elsewhere classified: Secondary | ICD-10-CM | POA: Diagnosis not present

## 2016-09-08 DIAGNOSIS — D509 Iron deficiency anemia, unspecified: Secondary | ICD-10-CM | POA: Diagnosis not present

## 2016-09-08 DIAGNOSIS — Z992 Dependence on renal dialysis: Secondary | ICD-10-CM | POA: Diagnosis not present

## 2016-09-08 DIAGNOSIS — N186 End stage renal disease: Secondary | ICD-10-CM | POA: Diagnosis not present

## 2016-09-08 DIAGNOSIS — D631 Anemia in chronic kidney disease: Secondary | ICD-10-CM | POA: Diagnosis not present

## 2016-09-10 DIAGNOSIS — D509 Iron deficiency anemia, unspecified: Secondary | ICD-10-CM | POA: Diagnosis not present

## 2016-09-10 DIAGNOSIS — D631 Anemia in chronic kidney disease: Secondary | ICD-10-CM | POA: Diagnosis not present

## 2016-09-10 DIAGNOSIS — N186 End stage renal disease: Secondary | ICD-10-CM | POA: Diagnosis not present

## 2016-09-10 DIAGNOSIS — Z992 Dependence on renal dialysis: Secondary | ICD-10-CM | POA: Diagnosis not present

## 2016-09-12 DIAGNOSIS — N186 End stage renal disease: Secondary | ICD-10-CM | POA: Diagnosis not present

## 2016-09-12 DIAGNOSIS — Z992 Dependence on renal dialysis: Secondary | ICD-10-CM | POA: Diagnosis not present

## 2016-09-12 DIAGNOSIS — D509 Iron deficiency anemia, unspecified: Secondary | ICD-10-CM | POA: Diagnosis not present

## 2016-09-12 DIAGNOSIS — D631 Anemia in chronic kidney disease: Secondary | ICD-10-CM | POA: Diagnosis not present

## 2016-09-15 DIAGNOSIS — Z992 Dependence on renal dialysis: Secondary | ICD-10-CM | POA: Diagnosis not present

## 2016-09-15 DIAGNOSIS — N186 End stage renal disease: Secondary | ICD-10-CM | POA: Diagnosis not present

## 2016-09-15 DIAGNOSIS — D631 Anemia in chronic kidney disease: Secondary | ICD-10-CM | POA: Diagnosis not present

## 2016-09-15 DIAGNOSIS — D509 Iron deficiency anemia, unspecified: Secondary | ICD-10-CM | POA: Diagnosis not present

## 2016-09-17 DIAGNOSIS — N186 End stage renal disease: Secondary | ICD-10-CM | POA: Diagnosis not present

## 2016-09-17 DIAGNOSIS — Z992 Dependence on renal dialysis: Secondary | ICD-10-CM | POA: Diagnosis not present

## 2016-09-17 DIAGNOSIS — D509 Iron deficiency anemia, unspecified: Secondary | ICD-10-CM | POA: Diagnosis not present

## 2016-09-17 DIAGNOSIS — D631 Anemia in chronic kidney disease: Secondary | ICD-10-CM | POA: Diagnosis not present

## 2016-09-18 DIAGNOSIS — Z1389 Encounter for screening for other disorder: Secondary | ICD-10-CM | POA: Diagnosis not present

## 2016-09-18 DIAGNOSIS — J158 Pneumonia due to other specified bacteria: Secondary | ICD-10-CM | POA: Diagnosis not present

## 2016-09-18 DIAGNOSIS — Z0000 Encounter for general adult medical examination without abnormal findings: Secondary | ICD-10-CM | POA: Diagnosis not present

## 2016-09-19 DIAGNOSIS — N186 End stage renal disease: Secondary | ICD-10-CM | POA: Diagnosis not present

## 2016-09-19 DIAGNOSIS — N2581 Secondary hyperparathyroidism of renal origin: Secondary | ICD-10-CM | POA: Diagnosis not present

## 2016-09-19 DIAGNOSIS — D509 Iron deficiency anemia, unspecified: Secondary | ICD-10-CM | POA: Diagnosis not present

## 2016-09-19 DIAGNOSIS — D631 Anemia in chronic kidney disease: Secondary | ICD-10-CM | POA: Diagnosis not present

## 2016-09-19 DIAGNOSIS — Z992 Dependence on renal dialysis: Secondary | ICD-10-CM | POA: Diagnosis not present

## 2016-09-22 DIAGNOSIS — D631 Anemia in chronic kidney disease: Secondary | ICD-10-CM | POA: Diagnosis not present

## 2016-09-22 DIAGNOSIS — D509 Iron deficiency anemia, unspecified: Secondary | ICD-10-CM | POA: Diagnosis not present

## 2016-09-22 DIAGNOSIS — N186 End stage renal disease: Secondary | ICD-10-CM | POA: Diagnosis not present

## 2016-09-22 DIAGNOSIS — Z992 Dependence on renal dialysis: Secondary | ICD-10-CM | POA: Diagnosis not present

## 2016-09-22 DIAGNOSIS — N2581 Secondary hyperparathyroidism of renal origin: Secondary | ICD-10-CM | POA: Diagnosis not present

## 2016-09-23 DIAGNOSIS — Z7901 Long term (current) use of anticoagulants: Secondary | ICD-10-CM | POA: Diagnosis not present

## 2016-09-24 DIAGNOSIS — N186 End stage renal disease: Secondary | ICD-10-CM | POA: Diagnosis not present

## 2016-09-24 DIAGNOSIS — D509 Iron deficiency anemia, unspecified: Secondary | ICD-10-CM | POA: Diagnosis not present

## 2016-09-24 DIAGNOSIS — D631 Anemia in chronic kidney disease: Secondary | ICD-10-CM | POA: Diagnosis not present

## 2016-09-24 DIAGNOSIS — Z992 Dependence on renal dialysis: Secondary | ICD-10-CM | POA: Diagnosis not present

## 2016-09-24 DIAGNOSIS — N2581 Secondary hyperparathyroidism of renal origin: Secondary | ICD-10-CM | POA: Diagnosis not present

## 2016-09-26 DIAGNOSIS — N2581 Secondary hyperparathyroidism of renal origin: Secondary | ICD-10-CM | POA: Diagnosis not present

## 2016-09-26 DIAGNOSIS — D631 Anemia in chronic kidney disease: Secondary | ICD-10-CM | POA: Diagnosis not present

## 2016-09-26 DIAGNOSIS — Z992 Dependence on renal dialysis: Secondary | ICD-10-CM | POA: Diagnosis not present

## 2016-09-26 DIAGNOSIS — N186 End stage renal disease: Secondary | ICD-10-CM | POA: Diagnosis not present

## 2016-09-26 DIAGNOSIS — D509 Iron deficiency anemia, unspecified: Secondary | ICD-10-CM | POA: Diagnosis not present

## 2016-09-29 DIAGNOSIS — N2581 Secondary hyperparathyroidism of renal origin: Secondary | ICD-10-CM | POA: Diagnosis not present

## 2016-09-29 DIAGNOSIS — Z992 Dependence on renal dialysis: Secondary | ICD-10-CM | POA: Diagnosis not present

## 2016-09-29 DIAGNOSIS — D631 Anemia in chronic kidney disease: Secondary | ICD-10-CM | POA: Diagnosis not present

## 2016-09-29 DIAGNOSIS — N186 End stage renal disease: Secondary | ICD-10-CM | POA: Diagnosis not present

## 2016-09-29 DIAGNOSIS — D509 Iron deficiency anemia, unspecified: Secondary | ICD-10-CM | POA: Diagnosis not present

## 2016-10-01 DIAGNOSIS — Z992 Dependence on renal dialysis: Secondary | ICD-10-CM | POA: Diagnosis not present

## 2016-10-01 DIAGNOSIS — D509 Iron deficiency anemia, unspecified: Secondary | ICD-10-CM | POA: Diagnosis not present

## 2016-10-01 DIAGNOSIS — D631 Anemia in chronic kidney disease: Secondary | ICD-10-CM | POA: Diagnosis not present

## 2016-10-01 DIAGNOSIS — N186 End stage renal disease: Secondary | ICD-10-CM | POA: Diagnosis not present

## 2016-10-01 DIAGNOSIS — N2581 Secondary hyperparathyroidism of renal origin: Secondary | ICD-10-CM | POA: Diagnosis not present

## 2016-10-03 DIAGNOSIS — D509 Iron deficiency anemia, unspecified: Secondary | ICD-10-CM | POA: Diagnosis not present

## 2016-10-03 DIAGNOSIS — N186 End stage renal disease: Secondary | ICD-10-CM | POA: Diagnosis not present

## 2016-10-03 DIAGNOSIS — D631 Anemia in chronic kidney disease: Secondary | ICD-10-CM | POA: Diagnosis not present

## 2016-10-03 DIAGNOSIS — Z992 Dependence on renal dialysis: Secondary | ICD-10-CM | POA: Diagnosis not present

## 2016-10-03 DIAGNOSIS — N2581 Secondary hyperparathyroidism of renal origin: Secondary | ICD-10-CM | POA: Diagnosis not present

## 2016-10-06 DIAGNOSIS — D509 Iron deficiency anemia, unspecified: Secondary | ICD-10-CM | POA: Diagnosis not present

## 2016-10-06 DIAGNOSIS — N186 End stage renal disease: Secondary | ICD-10-CM | POA: Diagnosis not present

## 2016-10-06 DIAGNOSIS — D631 Anemia in chronic kidney disease: Secondary | ICD-10-CM | POA: Diagnosis not present

## 2016-10-06 DIAGNOSIS — N2581 Secondary hyperparathyroidism of renal origin: Secondary | ICD-10-CM | POA: Diagnosis not present

## 2016-10-06 DIAGNOSIS — Z992 Dependence on renal dialysis: Secondary | ICD-10-CM | POA: Diagnosis not present

## 2016-10-08 DIAGNOSIS — Z992 Dependence on renal dialysis: Secondary | ICD-10-CM | POA: Diagnosis not present

## 2016-10-08 DIAGNOSIS — N2581 Secondary hyperparathyroidism of renal origin: Secondary | ICD-10-CM | POA: Diagnosis not present

## 2016-10-08 DIAGNOSIS — D509 Iron deficiency anemia, unspecified: Secondary | ICD-10-CM | POA: Diagnosis not present

## 2016-10-08 DIAGNOSIS — D631 Anemia in chronic kidney disease: Secondary | ICD-10-CM | POA: Diagnosis not present

## 2016-10-08 DIAGNOSIS — N186 End stage renal disease: Secondary | ICD-10-CM | POA: Diagnosis not present

## 2016-10-10 DIAGNOSIS — Z992 Dependence on renal dialysis: Secondary | ICD-10-CM | POA: Diagnosis not present

## 2016-10-10 DIAGNOSIS — D509 Iron deficiency anemia, unspecified: Secondary | ICD-10-CM | POA: Diagnosis not present

## 2016-10-10 DIAGNOSIS — D631 Anemia in chronic kidney disease: Secondary | ICD-10-CM | POA: Diagnosis not present

## 2016-10-10 DIAGNOSIS — N2581 Secondary hyperparathyroidism of renal origin: Secondary | ICD-10-CM | POA: Diagnosis not present

## 2016-10-10 DIAGNOSIS — N186 End stage renal disease: Secondary | ICD-10-CM | POA: Diagnosis not present

## 2016-10-12 DIAGNOSIS — N2581 Secondary hyperparathyroidism of renal origin: Secondary | ICD-10-CM | POA: Diagnosis not present

## 2016-10-12 DIAGNOSIS — D509 Iron deficiency anemia, unspecified: Secondary | ICD-10-CM | POA: Diagnosis not present

## 2016-10-12 DIAGNOSIS — D631 Anemia in chronic kidney disease: Secondary | ICD-10-CM | POA: Diagnosis not present

## 2016-10-12 DIAGNOSIS — N186 End stage renal disease: Secondary | ICD-10-CM | POA: Diagnosis not present

## 2016-10-12 DIAGNOSIS — Z992 Dependence on renal dialysis: Secondary | ICD-10-CM | POA: Diagnosis not present

## 2016-10-15 DIAGNOSIS — N2581 Secondary hyperparathyroidism of renal origin: Secondary | ICD-10-CM | POA: Diagnosis not present

## 2016-10-15 DIAGNOSIS — Z992 Dependence on renal dialysis: Secondary | ICD-10-CM | POA: Diagnosis not present

## 2016-10-15 DIAGNOSIS — D509 Iron deficiency anemia, unspecified: Secondary | ICD-10-CM | POA: Diagnosis not present

## 2016-10-15 DIAGNOSIS — N186 End stage renal disease: Secondary | ICD-10-CM | POA: Diagnosis not present

## 2016-10-15 DIAGNOSIS — D631 Anemia in chronic kidney disease: Secondary | ICD-10-CM | POA: Diagnosis not present

## 2016-10-17 DIAGNOSIS — N2581 Secondary hyperparathyroidism of renal origin: Secondary | ICD-10-CM | POA: Diagnosis not present

## 2016-10-17 DIAGNOSIS — D631 Anemia in chronic kidney disease: Secondary | ICD-10-CM | POA: Diagnosis not present

## 2016-10-17 DIAGNOSIS — Z992 Dependence on renal dialysis: Secondary | ICD-10-CM | POA: Diagnosis not present

## 2016-10-17 DIAGNOSIS — D509 Iron deficiency anemia, unspecified: Secondary | ICD-10-CM | POA: Diagnosis not present

## 2016-10-17 DIAGNOSIS — N186 End stage renal disease: Secondary | ICD-10-CM | POA: Diagnosis not present

## 2016-10-19 DIAGNOSIS — Z992 Dependence on renal dialysis: Secondary | ICD-10-CM | POA: Diagnosis not present

## 2016-10-19 DIAGNOSIS — D509 Iron deficiency anemia, unspecified: Secondary | ICD-10-CM | POA: Diagnosis not present

## 2016-10-19 DIAGNOSIS — D631 Anemia in chronic kidney disease: Secondary | ICD-10-CM | POA: Diagnosis not present

## 2016-10-19 DIAGNOSIS — N186 End stage renal disease: Secondary | ICD-10-CM | POA: Diagnosis not present

## 2016-10-19 DIAGNOSIS — N2581 Secondary hyperparathyroidism of renal origin: Secondary | ICD-10-CM | POA: Diagnosis not present

## 2016-10-21 DIAGNOSIS — Z452 Encounter for adjustment and management of vascular access device: Secondary | ICD-10-CM | POA: Diagnosis not present

## 2016-10-22 DIAGNOSIS — N186 End stage renal disease: Secondary | ICD-10-CM | POA: Diagnosis not present

## 2016-10-22 DIAGNOSIS — D509 Iron deficiency anemia, unspecified: Secondary | ICD-10-CM | POA: Diagnosis not present

## 2016-10-22 DIAGNOSIS — D631 Anemia in chronic kidney disease: Secondary | ICD-10-CM | POA: Diagnosis not present

## 2016-10-22 DIAGNOSIS — Z992 Dependence on renal dialysis: Secondary | ICD-10-CM | POA: Diagnosis not present

## 2016-10-24 DIAGNOSIS — D509 Iron deficiency anemia, unspecified: Secondary | ICD-10-CM | POA: Diagnosis not present

## 2016-10-24 DIAGNOSIS — N186 End stage renal disease: Secondary | ICD-10-CM | POA: Diagnosis not present

## 2016-10-24 DIAGNOSIS — Z992 Dependence on renal dialysis: Secondary | ICD-10-CM | POA: Diagnosis not present

## 2016-10-24 DIAGNOSIS — D631 Anemia in chronic kidney disease: Secondary | ICD-10-CM | POA: Diagnosis not present

## 2016-10-27 DIAGNOSIS — D509 Iron deficiency anemia, unspecified: Secondary | ICD-10-CM | POA: Diagnosis not present

## 2016-10-27 DIAGNOSIS — Z992 Dependence on renal dialysis: Secondary | ICD-10-CM | POA: Diagnosis not present

## 2016-10-27 DIAGNOSIS — D631 Anemia in chronic kidney disease: Secondary | ICD-10-CM | POA: Diagnosis not present

## 2016-10-27 DIAGNOSIS — N186 End stage renal disease: Secondary | ICD-10-CM | POA: Diagnosis not present

## 2016-10-28 DIAGNOSIS — J440 Chronic obstructive pulmonary disease with acute lower respiratory infection: Secondary | ICD-10-CM | POA: Diagnosis not present

## 2016-10-28 DIAGNOSIS — M545 Low back pain: Secondary | ICD-10-CM | POA: Diagnosis not present

## 2016-10-28 DIAGNOSIS — I481 Persistent atrial fibrillation: Secondary | ICD-10-CM | POA: Diagnosis not present

## 2016-10-29 DIAGNOSIS — D631 Anemia in chronic kidney disease: Secondary | ICD-10-CM | POA: Diagnosis not present

## 2016-10-29 DIAGNOSIS — N186 End stage renal disease: Secondary | ICD-10-CM | POA: Diagnosis not present

## 2016-10-29 DIAGNOSIS — Z992 Dependence on renal dialysis: Secondary | ICD-10-CM | POA: Diagnosis not present

## 2016-10-29 DIAGNOSIS — D509 Iron deficiency anemia, unspecified: Secondary | ICD-10-CM | POA: Diagnosis not present

## 2016-10-31 DIAGNOSIS — D509 Iron deficiency anemia, unspecified: Secondary | ICD-10-CM | POA: Diagnosis not present

## 2016-10-31 DIAGNOSIS — N186 End stage renal disease: Secondary | ICD-10-CM | POA: Diagnosis not present

## 2016-10-31 DIAGNOSIS — D631 Anemia in chronic kidney disease: Secondary | ICD-10-CM | POA: Diagnosis not present

## 2016-10-31 DIAGNOSIS — Z992 Dependence on renal dialysis: Secondary | ICD-10-CM | POA: Diagnosis not present

## 2016-11-03 DIAGNOSIS — N186 End stage renal disease: Secondary | ICD-10-CM | POA: Diagnosis not present

## 2016-11-03 DIAGNOSIS — D631 Anemia in chronic kidney disease: Secondary | ICD-10-CM | POA: Diagnosis not present

## 2016-11-03 DIAGNOSIS — D509 Iron deficiency anemia, unspecified: Secondary | ICD-10-CM | POA: Diagnosis not present

## 2016-11-03 DIAGNOSIS — Z992 Dependence on renal dialysis: Secondary | ICD-10-CM | POA: Diagnosis not present

## 2016-11-07 DIAGNOSIS — D509 Iron deficiency anemia, unspecified: Secondary | ICD-10-CM | POA: Diagnosis not present

## 2016-11-07 DIAGNOSIS — D631 Anemia in chronic kidney disease: Secondary | ICD-10-CM | POA: Diagnosis not present

## 2016-11-07 DIAGNOSIS — Z992 Dependence on renal dialysis: Secondary | ICD-10-CM | POA: Diagnosis not present

## 2016-11-07 DIAGNOSIS — N186 End stage renal disease: Secondary | ICD-10-CM | POA: Diagnosis not present

## 2016-11-10 DIAGNOSIS — N186 End stage renal disease: Secondary | ICD-10-CM | POA: Diagnosis not present

## 2016-11-10 DIAGNOSIS — D631 Anemia in chronic kidney disease: Secondary | ICD-10-CM | POA: Diagnosis not present

## 2016-11-10 DIAGNOSIS — Z992 Dependence on renal dialysis: Secondary | ICD-10-CM | POA: Diagnosis not present

## 2016-11-10 DIAGNOSIS — D509 Iron deficiency anemia, unspecified: Secondary | ICD-10-CM | POA: Diagnosis not present

## 2016-11-12 DIAGNOSIS — D509 Iron deficiency anemia, unspecified: Secondary | ICD-10-CM | POA: Diagnosis not present

## 2016-11-12 DIAGNOSIS — D631 Anemia in chronic kidney disease: Secondary | ICD-10-CM | POA: Diagnosis not present

## 2016-11-12 DIAGNOSIS — N186 End stage renal disease: Secondary | ICD-10-CM | POA: Diagnosis not present

## 2016-11-12 DIAGNOSIS — Z992 Dependence on renal dialysis: Secondary | ICD-10-CM | POA: Diagnosis not present

## 2016-11-14 DIAGNOSIS — N186 End stage renal disease: Secondary | ICD-10-CM | POA: Diagnosis not present

## 2016-11-14 DIAGNOSIS — D631 Anemia in chronic kidney disease: Secondary | ICD-10-CM | POA: Diagnosis not present

## 2016-11-14 DIAGNOSIS — D509 Iron deficiency anemia, unspecified: Secondary | ICD-10-CM | POA: Diagnosis not present

## 2016-11-14 DIAGNOSIS — Z992 Dependence on renal dialysis: Secondary | ICD-10-CM | POA: Diagnosis not present

## 2016-11-17 DIAGNOSIS — N186 End stage renal disease: Secondary | ICD-10-CM | POA: Diagnosis not present

## 2016-11-17 DIAGNOSIS — D509 Iron deficiency anemia, unspecified: Secondary | ICD-10-CM | POA: Diagnosis not present

## 2016-11-17 DIAGNOSIS — Z992 Dependence on renal dialysis: Secondary | ICD-10-CM | POA: Diagnosis not present

## 2016-11-17 DIAGNOSIS — D631 Anemia in chronic kidney disease: Secondary | ICD-10-CM | POA: Diagnosis not present

## 2016-11-19 DIAGNOSIS — Z992 Dependence on renal dialysis: Secondary | ICD-10-CM | POA: Diagnosis not present

## 2016-11-19 DIAGNOSIS — D509 Iron deficiency anemia, unspecified: Secondary | ICD-10-CM | POA: Diagnosis not present

## 2016-11-19 DIAGNOSIS — N186 End stage renal disease: Secondary | ICD-10-CM | POA: Diagnosis not present

## 2016-11-19 DIAGNOSIS — D631 Anemia in chronic kidney disease: Secondary | ICD-10-CM | POA: Diagnosis not present

## 2016-11-20 DIAGNOSIS — M545 Low back pain: Secondary | ICD-10-CM | POA: Diagnosis not present

## 2016-11-20 DIAGNOSIS — I10 Essential (primary) hypertension: Secondary | ICD-10-CM | POA: Diagnosis not present

## 2016-11-20 DIAGNOSIS — Z7901 Long term (current) use of anticoagulants: Secondary | ICD-10-CM | POA: Diagnosis not present

## 2016-11-20 DIAGNOSIS — Z89511 Acquired absence of right leg below knee: Secondary | ICD-10-CM | POA: Diagnosis not present

## 2016-11-20 DIAGNOSIS — Z79899 Other long term (current) drug therapy: Secondary | ICD-10-CM | POA: Diagnosis not present

## 2016-11-20 DIAGNOSIS — I481 Persistent atrial fibrillation: Secondary | ICD-10-CM | POA: Diagnosis not present

## 2016-11-20 DIAGNOSIS — Z89512 Acquired absence of left leg below knee: Secondary | ICD-10-CM | POA: Diagnosis not present

## 2016-11-20 DIAGNOSIS — I358 Other nonrheumatic aortic valve disorders: Secondary | ICD-10-CM | POA: Diagnosis not present

## 2016-11-20 DIAGNOSIS — J440 Chronic obstructive pulmonary disease with acute lower respiratory infection: Secondary | ICD-10-CM | POA: Diagnosis not present

## 2016-11-21 DIAGNOSIS — N186 End stage renal disease: Secondary | ICD-10-CM | POA: Diagnosis not present

## 2016-11-21 DIAGNOSIS — D631 Anemia in chronic kidney disease: Secondary | ICD-10-CM | POA: Diagnosis not present

## 2016-11-21 DIAGNOSIS — D509 Iron deficiency anemia, unspecified: Secondary | ICD-10-CM | POA: Diagnosis not present

## 2016-11-21 DIAGNOSIS — Z992 Dependence on renal dialysis: Secondary | ICD-10-CM | POA: Diagnosis not present

## 2016-11-24 DIAGNOSIS — Z992 Dependence on renal dialysis: Secondary | ICD-10-CM | POA: Diagnosis not present

## 2016-11-24 DIAGNOSIS — D509 Iron deficiency anemia, unspecified: Secondary | ICD-10-CM | POA: Diagnosis not present

## 2016-11-24 DIAGNOSIS — D631 Anemia in chronic kidney disease: Secondary | ICD-10-CM | POA: Diagnosis not present

## 2016-11-24 DIAGNOSIS — N186 End stage renal disease: Secondary | ICD-10-CM | POA: Diagnosis not present

## 2016-11-26 DIAGNOSIS — D509 Iron deficiency anemia, unspecified: Secondary | ICD-10-CM | POA: Diagnosis not present

## 2016-11-26 DIAGNOSIS — N186 End stage renal disease: Secondary | ICD-10-CM | POA: Diagnosis not present

## 2016-11-26 DIAGNOSIS — Z992 Dependence on renal dialysis: Secondary | ICD-10-CM | POA: Diagnosis not present

## 2016-11-26 DIAGNOSIS — D631 Anemia in chronic kidney disease: Secondary | ICD-10-CM | POA: Diagnosis not present

## 2016-11-27 DIAGNOSIS — Z452 Encounter for adjustment and management of vascular access device: Secondary | ICD-10-CM | POA: Diagnosis not present

## 2016-11-28 DIAGNOSIS — D631 Anemia in chronic kidney disease: Secondary | ICD-10-CM | POA: Diagnosis not present

## 2016-11-28 DIAGNOSIS — N186 End stage renal disease: Secondary | ICD-10-CM | POA: Diagnosis not present

## 2016-11-28 DIAGNOSIS — D509 Iron deficiency anemia, unspecified: Secondary | ICD-10-CM | POA: Diagnosis not present

## 2016-11-28 DIAGNOSIS — Z992 Dependence on renal dialysis: Secondary | ICD-10-CM | POA: Diagnosis not present

## 2016-12-01 DIAGNOSIS — N186 End stage renal disease: Secondary | ICD-10-CM | POA: Diagnosis not present

## 2016-12-01 DIAGNOSIS — Z992 Dependence on renal dialysis: Secondary | ICD-10-CM | POA: Diagnosis not present

## 2016-12-01 DIAGNOSIS — D631 Anemia in chronic kidney disease: Secondary | ICD-10-CM | POA: Diagnosis not present

## 2016-12-01 DIAGNOSIS — D509 Iron deficiency anemia, unspecified: Secondary | ICD-10-CM | POA: Diagnosis not present

## 2016-12-03 DIAGNOSIS — D509 Iron deficiency anemia, unspecified: Secondary | ICD-10-CM | POA: Diagnosis not present

## 2016-12-03 DIAGNOSIS — N186 End stage renal disease: Secondary | ICD-10-CM | POA: Diagnosis not present

## 2016-12-03 DIAGNOSIS — D631 Anemia in chronic kidney disease: Secondary | ICD-10-CM | POA: Diagnosis not present

## 2016-12-03 DIAGNOSIS — Z992 Dependence on renal dialysis: Secondary | ICD-10-CM | POA: Diagnosis not present

## 2016-12-05 DIAGNOSIS — Z992 Dependence on renal dialysis: Secondary | ICD-10-CM | POA: Diagnosis not present

## 2016-12-05 DIAGNOSIS — D631 Anemia in chronic kidney disease: Secondary | ICD-10-CM | POA: Diagnosis not present

## 2016-12-05 DIAGNOSIS — D509 Iron deficiency anemia, unspecified: Secondary | ICD-10-CM | POA: Diagnosis not present

## 2016-12-05 DIAGNOSIS — N186 End stage renal disease: Secondary | ICD-10-CM | POA: Diagnosis not present

## 2016-12-08 DIAGNOSIS — N186 End stage renal disease: Secondary | ICD-10-CM | POA: Diagnosis not present

## 2016-12-08 DIAGNOSIS — D631 Anemia in chronic kidney disease: Secondary | ICD-10-CM | POA: Diagnosis not present

## 2016-12-08 DIAGNOSIS — Z992 Dependence on renal dialysis: Secondary | ICD-10-CM | POA: Diagnosis not present

## 2016-12-08 DIAGNOSIS — D509 Iron deficiency anemia, unspecified: Secondary | ICD-10-CM | POA: Diagnosis not present

## 2016-12-10 DIAGNOSIS — Z992 Dependence on renal dialysis: Secondary | ICD-10-CM | POA: Diagnosis not present

## 2016-12-10 DIAGNOSIS — N186 End stage renal disease: Secondary | ICD-10-CM | POA: Diagnosis not present

## 2016-12-10 DIAGNOSIS — D631 Anemia in chronic kidney disease: Secondary | ICD-10-CM | POA: Diagnosis not present

## 2016-12-10 DIAGNOSIS — D509 Iron deficiency anemia, unspecified: Secondary | ICD-10-CM | POA: Diagnosis not present

## 2016-12-12 DIAGNOSIS — Z992 Dependence on renal dialysis: Secondary | ICD-10-CM | POA: Diagnosis not present

## 2016-12-12 DIAGNOSIS — N186 End stage renal disease: Secondary | ICD-10-CM | POA: Diagnosis not present

## 2016-12-12 DIAGNOSIS — D631 Anemia in chronic kidney disease: Secondary | ICD-10-CM | POA: Diagnosis not present

## 2016-12-12 DIAGNOSIS — D509 Iron deficiency anemia, unspecified: Secondary | ICD-10-CM | POA: Diagnosis not present

## 2016-12-15 DIAGNOSIS — Z992 Dependence on renal dialysis: Secondary | ICD-10-CM | POA: Diagnosis not present

## 2016-12-15 DIAGNOSIS — D631 Anemia in chronic kidney disease: Secondary | ICD-10-CM | POA: Diagnosis not present

## 2016-12-15 DIAGNOSIS — D509 Iron deficiency anemia, unspecified: Secondary | ICD-10-CM | POA: Diagnosis not present

## 2016-12-15 DIAGNOSIS — N186 End stage renal disease: Secondary | ICD-10-CM | POA: Diagnosis not present

## 2016-12-17 DIAGNOSIS — N186 End stage renal disease: Secondary | ICD-10-CM | POA: Diagnosis not present

## 2016-12-17 DIAGNOSIS — D631 Anemia in chronic kidney disease: Secondary | ICD-10-CM | POA: Diagnosis not present

## 2016-12-17 DIAGNOSIS — Z992 Dependence on renal dialysis: Secondary | ICD-10-CM | POA: Diagnosis not present

## 2016-12-17 DIAGNOSIS — D509 Iron deficiency anemia, unspecified: Secondary | ICD-10-CM | POA: Diagnosis not present

## 2016-12-19 DIAGNOSIS — N186 End stage renal disease: Secondary | ICD-10-CM | POA: Diagnosis not present

## 2016-12-19 DIAGNOSIS — D631 Anemia in chronic kidney disease: Secondary | ICD-10-CM | POA: Diagnosis not present

## 2016-12-19 DIAGNOSIS — D509 Iron deficiency anemia, unspecified: Secondary | ICD-10-CM | POA: Diagnosis not present

## 2016-12-19 DIAGNOSIS — N2581 Secondary hyperparathyroidism of renal origin: Secondary | ICD-10-CM | POA: Diagnosis not present

## 2016-12-19 DIAGNOSIS — Z992 Dependence on renal dialysis: Secondary | ICD-10-CM | POA: Diagnosis not present

## 2016-12-22 DIAGNOSIS — Z992 Dependence on renal dialysis: Secondary | ICD-10-CM | POA: Diagnosis not present

## 2016-12-22 DIAGNOSIS — N186 End stage renal disease: Secondary | ICD-10-CM | POA: Diagnosis not present

## 2016-12-22 DIAGNOSIS — D631 Anemia in chronic kidney disease: Secondary | ICD-10-CM | POA: Diagnosis not present

## 2016-12-22 DIAGNOSIS — D509 Iron deficiency anemia, unspecified: Secondary | ICD-10-CM | POA: Diagnosis not present

## 2016-12-22 DIAGNOSIS — N2581 Secondary hyperparathyroidism of renal origin: Secondary | ICD-10-CM | POA: Diagnosis not present

## 2016-12-24 DIAGNOSIS — D509 Iron deficiency anemia, unspecified: Secondary | ICD-10-CM | POA: Diagnosis not present

## 2016-12-24 DIAGNOSIS — Z992 Dependence on renal dialysis: Secondary | ICD-10-CM | POA: Diagnosis not present

## 2016-12-24 DIAGNOSIS — N186 End stage renal disease: Secondary | ICD-10-CM | POA: Diagnosis not present

## 2016-12-24 DIAGNOSIS — D631 Anemia in chronic kidney disease: Secondary | ICD-10-CM | POA: Diagnosis not present

## 2016-12-24 DIAGNOSIS — N2581 Secondary hyperparathyroidism of renal origin: Secondary | ICD-10-CM | POA: Diagnosis not present

## 2016-12-25 DIAGNOSIS — Z452 Encounter for adjustment and management of vascular access device: Secondary | ICD-10-CM | POA: Diagnosis not present

## 2016-12-26 DIAGNOSIS — D631 Anemia in chronic kidney disease: Secondary | ICD-10-CM | POA: Diagnosis not present

## 2016-12-26 DIAGNOSIS — D509 Iron deficiency anemia, unspecified: Secondary | ICD-10-CM | POA: Diagnosis not present

## 2016-12-26 DIAGNOSIS — Z992 Dependence on renal dialysis: Secondary | ICD-10-CM | POA: Diagnosis not present

## 2016-12-26 DIAGNOSIS — N2581 Secondary hyperparathyroidism of renal origin: Secondary | ICD-10-CM | POA: Diagnosis not present

## 2016-12-26 DIAGNOSIS — N186 End stage renal disease: Secondary | ICD-10-CM | POA: Diagnosis not present

## 2016-12-29 DIAGNOSIS — N2581 Secondary hyperparathyroidism of renal origin: Secondary | ICD-10-CM | POA: Diagnosis not present

## 2016-12-29 DIAGNOSIS — D509 Iron deficiency anemia, unspecified: Secondary | ICD-10-CM | POA: Diagnosis not present

## 2016-12-29 DIAGNOSIS — D631 Anemia in chronic kidney disease: Secondary | ICD-10-CM | POA: Diagnosis not present

## 2016-12-29 DIAGNOSIS — N186 End stage renal disease: Secondary | ICD-10-CM | POA: Diagnosis not present

## 2016-12-29 DIAGNOSIS — Z992 Dependence on renal dialysis: Secondary | ICD-10-CM | POA: Diagnosis not present

## 2016-12-31 DIAGNOSIS — D509 Iron deficiency anemia, unspecified: Secondary | ICD-10-CM | POA: Diagnosis not present

## 2016-12-31 DIAGNOSIS — Z992 Dependence on renal dialysis: Secondary | ICD-10-CM | POA: Diagnosis not present

## 2016-12-31 DIAGNOSIS — D631 Anemia in chronic kidney disease: Secondary | ICD-10-CM | POA: Diagnosis not present

## 2016-12-31 DIAGNOSIS — N186 End stage renal disease: Secondary | ICD-10-CM | POA: Diagnosis not present

## 2016-12-31 DIAGNOSIS — N2581 Secondary hyperparathyroidism of renal origin: Secondary | ICD-10-CM | POA: Diagnosis not present

## 2017-01-01 DIAGNOSIS — I358 Other nonrheumatic aortic valve disorders: Secondary | ICD-10-CM | POA: Diagnosis not present

## 2017-01-01 DIAGNOSIS — M545 Low back pain: Secondary | ICD-10-CM | POA: Diagnosis not present

## 2017-01-01 DIAGNOSIS — I481 Persistent atrial fibrillation: Secondary | ICD-10-CM | POA: Diagnosis not present

## 2017-01-01 DIAGNOSIS — Z89512 Acquired absence of left leg below knee: Secondary | ICD-10-CM | POA: Diagnosis not present

## 2017-01-01 DIAGNOSIS — I10 Essential (primary) hypertension: Secondary | ICD-10-CM | POA: Diagnosis not present

## 2017-01-01 DIAGNOSIS — Z89511 Acquired absence of right leg below knee: Secondary | ICD-10-CM | POA: Diagnosis not present

## 2017-01-01 DIAGNOSIS — Z131 Encounter for screening for diabetes mellitus: Secondary | ICD-10-CM | POA: Diagnosis not present

## 2017-01-01 DIAGNOSIS — J440 Chronic obstructive pulmonary disease with acute lower respiratory infection: Secondary | ICD-10-CM | POA: Diagnosis not present

## 2017-01-02 DIAGNOSIS — D509 Iron deficiency anemia, unspecified: Secondary | ICD-10-CM | POA: Diagnosis not present

## 2017-01-02 DIAGNOSIS — N2581 Secondary hyperparathyroidism of renal origin: Secondary | ICD-10-CM | POA: Diagnosis not present

## 2017-01-02 DIAGNOSIS — Z992 Dependence on renal dialysis: Secondary | ICD-10-CM | POA: Diagnosis not present

## 2017-01-02 DIAGNOSIS — N186 End stage renal disease: Secondary | ICD-10-CM | POA: Diagnosis not present

## 2017-01-02 DIAGNOSIS — D631 Anemia in chronic kidney disease: Secondary | ICD-10-CM | POA: Diagnosis not present

## 2017-01-05 DIAGNOSIS — Z992 Dependence on renal dialysis: Secondary | ICD-10-CM | POA: Diagnosis not present

## 2017-01-05 DIAGNOSIS — D631 Anemia in chronic kidney disease: Secondary | ICD-10-CM | POA: Diagnosis not present

## 2017-01-05 DIAGNOSIS — N2581 Secondary hyperparathyroidism of renal origin: Secondary | ICD-10-CM | POA: Diagnosis not present

## 2017-01-05 DIAGNOSIS — N186 End stage renal disease: Secondary | ICD-10-CM | POA: Diagnosis not present

## 2017-01-05 DIAGNOSIS — D509 Iron deficiency anemia, unspecified: Secondary | ICD-10-CM | POA: Diagnosis not present

## 2017-01-07 DIAGNOSIS — Z992 Dependence on renal dialysis: Secondary | ICD-10-CM | POA: Diagnosis not present

## 2017-01-07 DIAGNOSIS — N186 End stage renal disease: Secondary | ICD-10-CM | POA: Diagnosis not present

## 2017-01-07 DIAGNOSIS — D631 Anemia in chronic kidney disease: Secondary | ICD-10-CM | POA: Diagnosis not present

## 2017-01-07 DIAGNOSIS — N2581 Secondary hyperparathyroidism of renal origin: Secondary | ICD-10-CM | POA: Diagnosis not present

## 2017-01-07 DIAGNOSIS — D509 Iron deficiency anemia, unspecified: Secondary | ICD-10-CM | POA: Diagnosis not present

## 2017-01-09 DIAGNOSIS — N2581 Secondary hyperparathyroidism of renal origin: Secondary | ICD-10-CM | POA: Diagnosis not present

## 2017-01-09 DIAGNOSIS — D509 Iron deficiency anemia, unspecified: Secondary | ICD-10-CM | POA: Diagnosis not present

## 2017-01-09 DIAGNOSIS — N186 End stage renal disease: Secondary | ICD-10-CM | POA: Diagnosis not present

## 2017-01-09 DIAGNOSIS — D631 Anemia in chronic kidney disease: Secondary | ICD-10-CM | POA: Diagnosis not present

## 2017-01-09 DIAGNOSIS — Z992 Dependence on renal dialysis: Secondary | ICD-10-CM | POA: Diagnosis not present

## 2017-01-12 DIAGNOSIS — D631 Anemia in chronic kidney disease: Secondary | ICD-10-CM | POA: Diagnosis not present

## 2017-01-12 DIAGNOSIS — E119 Type 2 diabetes mellitus without complications: Secondary | ICD-10-CM | POA: Diagnosis not present

## 2017-01-12 DIAGNOSIS — D509 Iron deficiency anemia, unspecified: Secondary | ICD-10-CM | POA: Diagnosis not present

## 2017-01-12 DIAGNOSIS — N2581 Secondary hyperparathyroidism of renal origin: Secondary | ICD-10-CM | POA: Diagnosis not present

## 2017-01-12 DIAGNOSIS — N186 End stage renal disease: Secondary | ICD-10-CM | POA: Diagnosis not present

## 2017-01-12 DIAGNOSIS — Z992 Dependence on renal dialysis: Secondary | ICD-10-CM | POA: Diagnosis not present

## 2017-01-14 DIAGNOSIS — D631 Anemia in chronic kidney disease: Secondary | ICD-10-CM | POA: Diagnosis not present

## 2017-01-14 DIAGNOSIS — N186 End stage renal disease: Secondary | ICD-10-CM | POA: Diagnosis not present

## 2017-01-14 DIAGNOSIS — N2581 Secondary hyperparathyroidism of renal origin: Secondary | ICD-10-CM | POA: Diagnosis not present

## 2017-01-14 DIAGNOSIS — D509 Iron deficiency anemia, unspecified: Secondary | ICD-10-CM | POA: Diagnosis not present

## 2017-01-14 DIAGNOSIS — Z992 Dependence on renal dialysis: Secondary | ICD-10-CM | POA: Diagnosis not present

## 2017-01-16 DIAGNOSIS — D509 Iron deficiency anemia, unspecified: Secondary | ICD-10-CM | POA: Diagnosis not present

## 2017-01-16 DIAGNOSIS — Z992 Dependence on renal dialysis: Secondary | ICD-10-CM | POA: Diagnosis not present

## 2017-01-16 DIAGNOSIS — N186 End stage renal disease: Secondary | ICD-10-CM | POA: Diagnosis not present

## 2017-01-16 DIAGNOSIS — D631 Anemia in chronic kidney disease: Secondary | ICD-10-CM | POA: Diagnosis not present

## 2017-01-16 DIAGNOSIS — N2581 Secondary hyperparathyroidism of renal origin: Secondary | ICD-10-CM | POA: Diagnosis not present

## 2017-01-17 DIAGNOSIS — N186 End stage renal disease: Secondary | ICD-10-CM | POA: Diagnosis not present

## 2017-01-17 DIAGNOSIS — Z992 Dependence on renal dialysis: Secondary | ICD-10-CM | POA: Diagnosis not present

## 2017-01-19 DIAGNOSIS — D631 Anemia in chronic kidney disease: Secondary | ICD-10-CM | POA: Diagnosis not present

## 2017-01-19 DIAGNOSIS — Z992 Dependence on renal dialysis: Secondary | ICD-10-CM | POA: Diagnosis not present

## 2017-01-19 DIAGNOSIS — N186 End stage renal disease: Secondary | ICD-10-CM | POA: Diagnosis not present

## 2017-01-19 DIAGNOSIS — D509 Iron deficiency anemia, unspecified: Secondary | ICD-10-CM | POA: Diagnosis not present

## 2017-01-21 DIAGNOSIS — Z992 Dependence on renal dialysis: Secondary | ICD-10-CM | POA: Diagnosis not present

## 2017-01-21 DIAGNOSIS — D631 Anemia in chronic kidney disease: Secondary | ICD-10-CM | POA: Diagnosis not present

## 2017-01-21 DIAGNOSIS — D509 Iron deficiency anemia, unspecified: Secondary | ICD-10-CM | POA: Diagnosis not present

## 2017-01-21 DIAGNOSIS — N186 End stage renal disease: Secondary | ICD-10-CM | POA: Diagnosis not present

## 2017-01-23 DIAGNOSIS — N186 End stage renal disease: Secondary | ICD-10-CM | POA: Diagnosis not present

## 2017-01-23 DIAGNOSIS — D509 Iron deficiency anemia, unspecified: Secondary | ICD-10-CM | POA: Diagnosis not present

## 2017-01-23 DIAGNOSIS — D631 Anemia in chronic kidney disease: Secondary | ICD-10-CM | POA: Diagnosis not present

## 2017-01-23 DIAGNOSIS — Z992 Dependence on renal dialysis: Secondary | ICD-10-CM | POA: Diagnosis not present

## 2017-01-26 DIAGNOSIS — Z992 Dependence on renal dialysis: Secondary | ICD-10-CM | POA: Diagnosis not present

## 2017-01-26 DIAGNOSIS — N186 End stage renal disease: Secondary | ICD-10-CM | POA: Diagnosis not present

## 2017-01-26 DIAGNOSIS — D509 Iron deficiency anemia, unspecified: Secondary | ICD-10-CM | POA: Diagnosis not present

## 2017-01-26 DIAGNOSIS — D631 Anemia in chronic kidney disease: Secondary | ICD-10-CM | POA: Diagnosis not present

## 2017-01-27 DIAGNOSIS — Z452 Encounter for adjustment and management of vascular access device: Secondary | ICD-10-CM | POA: Diagnosis not present

## 2017-01-28 DIAGNOSIS — N186 End stage renal disease: Secondary | ICD-10-CM | POA: Diagnosis not present

## 2017-01-28 DIAGNOSIS — D631 Anemia in chronic kidney disease: Secondary | ICD-10-CM | POA: Diagnosis not present

## 2017-01-28 DIAGNOSIS — D509 Iron deficiency anemia, unspecified: Secondary | ICD-10-CM | POA: Diagnosis not present

## 2017-01-28 DIAGNOSIS — Z992 Dependence on renal dialysis: Secondary | ICD-10-CM | POA: Diagnosis not present

## 2017-01-30 DIAGNOSIS — D509 Iron deficiency anemia, unspecified: Secondary | ICD-10-CM | POA: Diagnosis not present

## 2017-01-30 DIAGNOSIS — D631 Anemia in chronic kidney disease: Secondary | ICD-10-CM | POA: Diagnosis not present

## 2017-01-30 DIAGNOSIS — N186 End stage renal disease: Secondary | ICD-10-CM | POA: Diagnosis not present

## 2017-01-30 DIAGNOSIS — Z992 Dependence on renal dialysis: Secondary | ICD-10-CM | POA: Diagnosis not present

## 2017-02-02 DIAGNOSIS — D631 Anemia in chronic kidney disease: Secondary | ICD-10-CM | POA: Diagnosis not present

## 2017-02-02 DIAGNOSIS — Z992 Dependence on renal dialysis: Secondary | ICD-10-CM | POA: Diagnosis not present

## 2017-02-02 DIAGNOSIS — N186 End stage renal disease: Secondary | ICD-10-CM | POA: Diagnosis not present

## 2017-02-02 DIAGNOSIS — D509 Iron deficiency anemia, unspecified: Secondary | ICD-10-CM | POA: Diagnosis not present

## 2017-02-04 DIAGNOSIS — D509 Iron deficiency anemia, unspecified: Secondary | ICD-10-CM | POA: Diagnosis not present

## 2017-02-04 DIAGNOSIS — Z992 Dependence on renal dialysis: Secondary | ICD-10-CM | POA: Diagnosis not present

## 2017-02-04 DIAGNOSIS — N186 End stage renal disease: Secondary | ICD-10-CM | POA: Diagnosis not present

## 2017-02-04 DIAGNOSIS — D631 Anemia in chronic kidney disease: Secondary | ICD-10-CM | POA: Diagnosis not present

## 2017-02-06 DIAGNOSIS — D631 Anemia in chronic kidney disease: Secondary | ICD-10-CM | POA: Diagnosis not present

## 2017-02-06 DIAGNOSIS — Z992 Dependence on renal dialysis: Secondary | ICD-10-CM | POA: Diagnosis not present

## 2017-02-06 DIAGNOSIS — D509 Iron deficiency anemia, unspecified: Secondary | ICD-10-CM | POA: Diagnosis not present

## 2017-02-06 DIAGNOSIS — N186 End stage renal disease: Secondary | ICD-10-CM | POA: Diagnosis not present

## 2017-02-09 DIAGNOSIS — D631 Anemia in chronic kidney disease: Secondary | ICD-10-CM | POA: Diagnosis not present

## 2017-02-09 DIAGNOSIS — Z992 Dependence on renal dialysis: Secondary | ICD-10-CM | POA: Diagnosis not present

## 2017-02-09 DIAGNOSIS — N186 End stage renal disease: Secondary | ICD-10-CM | POA: Diagnosis not present

## 2017-02-09 DIAGNOSIS — D509 Iron deficiency anemia, unspecified: Secondary | ICD-10-CM | POA: Diagnosis not present

## 2017-02-11 DIAGNOSIS — D631 Anemia in chronic kidney disease: Secondary | ICD-10-CM | POA: Diagnosis not present

## 2017-02-11 DIAGNOSIS — D509 Iron deficiency anemia, unspecified: Secondary | ICD-10-CM | POA: Diagnosis not present

## 2017-02-11 DIAGNOSIS — N186 End stage renal disease: Secondary | ICD-10-CM | POA: Diagnosis not present

## 2017-02-11 DIAGNOSIS — Z992 Dependence on renal dialysis: Secondary | ICD-10-CM | POA: Diagnosis not present

## 2017-02-13 DIAGNOSIS — D631 Anemia in chronic kidney disease: Secondary | ICD-10-CM | POA: Diagnosis not present

## 2017-02-13 DIAGNOSIS — Z992 Dependence on renal dialysis: Secondary | ICD-10-CM | POA: Diagnosis not present

## 2017-02-13 DIAGNOSIS — D509 Iron deficiency anemia, unspecified: Secondary | ICD-10-CM | POA: Diagnosis not present

## 2017-02-13 DIAGNOSIS — N186 End stage renal disease: Secondary | ICD-10-CM | POA: Diagnosis not present

## 2017-02-16 DIAGNOSIS — Z992 Dependence on renal dialysis: Secondary | ICD-10-CM | POA: Diagnosis not present

## 2017-02-16 DIAGNOSIS — D631 Anemia in chronic kidney disease: Secondary | ICD-10-CM | POA: Diagnosis not present

## 2017-02-16 DIAGNOSIS — N186 End stage renal disease: Secondary | ICD-10-CM | POA: Diagnosis not present

## 2017-02-16 DIAGNOSIS — D509 Iron deficiency anemia, unspecified: Secondary | ICD-10-CM | POA: Diagnosis not present

## 2017-02-18 DIAGNOSIS — D509 Iron deficiency anemia, unspecified: Secondary | ICD-10-CM | POA: Diagnosis not present

## 2017-02-18 DIAGNOSIS — Z992 Dependence on renal dialysis: Secondary | ICD-10-CM | POA: Diagnosis not present

## 2017-02-18 DIAGNOSIS — D631 Anemia in chronic kidney disease: Secondary | ICD-10-CM | POA: Diagnosis not present

## 2017-02-18 DIAGNOSIS — N186 End stage renal disease: Secondary | ICD-10-CM | POA: Diagnosis not present

## 2017-02-20 DIAGNOSIS — Z992 Dependence on renal dialysis: Secondary | ICD-10-CM | POA: Diagnosis not present

## 2017-02-20 DIAGNOSIS — D631 Anemia in chronic kidney disease: Secondary | ICD-10-CM | POA: Diagnosis not present

## 2017-02-20 DIAGNOSIS — N186 End stage renal disease: Secondary | ICD-10-CM | POA: Diagnosis not present

## 2017-02-20 DIAGNOSIS — D509 Iron deficiency anemia, unspecified: Secondary | ICD-10-CM | POA: Diagnosis not present

## 2017-02-23 DIAGNOSIS — D631 Anemia in chronic kidney disease: Secondary | ICD-10-CM | POA: Diagnosis not present

## 2017-02-23 DIAGNOSIS — N186 End stage renal disease: Secondary | ICD-10-CM | POA: Diagnosis not present

## 2017-02-23 DIAGNOSIS — Z992 Dependence on renal dialysis: Secondary | ICD-10-CM | POA: Diagnosis not present

## 2017-02-23 DIAGNOSIS — D509 Iron deficiency anemia, unspecified: Secondary | ICD-10-CM | POA: Diagnosis not present

## 2017-02-25 DIAGNOSIS — D631 Anemia in chronic kidney disease: Secondary | ICD-10-CM | POA: Diagnosis not present

## 2017-02-25 DIAGNOSIS — D509 Iron deficiency anemia, unspecified: Secondary | ICD-10-CM | POA: Diagnosis not present

## 2017-02-25 DIAGNOSIS — Z992 Dependence on renal dialysis: Secondary | ICD-10-CM | POA: Diagnosis not present

## 2017-02-25 DIAGNOSIS — N186 End stage renal disease: Secondary | ICD-10-CM | POA: Diagnosis not present

## 2017-02-27 DIAGNOSIS — Z452 Encounter for adjustment and management of vascular access device: Secondary | ICD-10-CM | POA: Diagnosis not present

## 2017-02-27 DIAGNOSIS — D509 Iron deficiency anemia, unspecified: Secondary | ICD-10-CM | POA: Diagnosis not present

## 2017-02-27 DIAGNOSIS — D631 Anemia in chronic kidney disease: Secondary | ICD-10-CM | POA: Diagnosis not present

## 2017-02-27 DIAGNOSIS — N186 End stage renal disease: Secondary | ICD-10-CM | POA: Diagnosis not present

## 2017-02-27 DIAGNOSIS — Z992 Dependence on renal dialysis: Secondary | ICD-10-CM | POA: Diagnosis not present

## 2017-03-02 DIAGNOSIS — D631 Anemia in chronic kidney disease: Secondary | ICD-10-CM | POA: Diagnosis not present

## 2017-03-02 DIAGNOSIS — D509 Iron deficiency anemia, unspecified: Secondary | ICD-10-CM | POA: Diagnosis not present

## 2017-03-02 DIAGNOSIS — N186 End stage renal disease: Secondary | ICD-10-CM | POA: Diagnosis not present

## 2017-03-02 DIAGNOSIS — Z992 Dependence on renal dialysis: Secondary | ICD-10-CM | POA: Diagnosis not present

## 2017-03-03 DIAGNOSIS — M545 Low back pain: Secondary | ICD-10-CM | POA: Diagnosis not present

## 2017-03-03 DIAGNOSIS — Z89512 Acquired absence of left leg below knee: Secondary | ICD-10-CM | POA: Diagnosis not present

## 2017-03-03 DIAGNOSIS — I10 Essential (primary) hypertension: Secondary | ICD-10-CM | POA: Diagnosis not present

## 2017-03-03 DIAGNOSIS — J440 Chronic obstructive pulmonary disease with acute lower respiratory infection: Secondary | ICD-10-CM | POA: Diagnosis not present

## 2017-03-03 DIAGNOSIS — I358 Other nonrheumatic aortic valve disorders: Secondary | ICD-10-CM | POA: Diagnosis not present

## 2017-03-03 DIAGNOSIS — Z89511 Acquired absence of right leg below knee: Secondary | ICD-10-CM | POA: Diagnosis not present

## 2017-03-03 DIAGNOSIS — I481 Persistent atrial fibrillation: Secondary | ICD-10-CM | POA: Diagnosis not present

## 2017-03-04 DIAGNOSIS — D509 Iron deficiency anemia, unspecified: Secondary | ICD-10-CM | POA: Diagnosis not present

## 2017-03-04 DIAGNOSIS — Z992 Dependence on renal dialysis: Secondary | ICD-10-CM | POA: Diagnosis not present

## 2017-03-04 DIAGNOSIS — D631 Anemia in chronic kidney disease: Secondary | ICD-10-CM | POA: Diagnosis not present

## 2017-03-04 DIAGNOSIS — N186 End stage renal disease: Secondary | ICD-10-CM | POA: Diagnosis not present

## 2017-03-06 DIAGNOSIS — Z992 Dependence on renal dialysis: Secondary | ICD-10-CM | POA: Diagnosis not present

## 2017-03-06 DIAGNOSIS — N186 End stage renal disease: Secondary | ICD-10-CM | POA: Diagnosis not present

## 2017-03-06 DIAGNOSIS — D631 Anemia in chronic kidney disease: Secondary | ICD-10-CM | POA: Diagnosis not present

## 2017-03-06 DIAGNOSIS — D509 Iron deficiency anemia, unspecified: Secondary | ICD-10-CM | POA: Diagnosis not present

## 2017-03-09 DIAGNOSIS — Z992 Dependence on renal dialysis: Secondary | ICD-10-CM | POA: Diagnosis not present

## 2017-03-09 DIAGNOSIS — D631 Anemia in chronic kidney disease: Secondary | ICD-10-CM | POA: Diagnosis not present

## 2017-03-09 DIAGNOSIS — D509 Iron deficiency anemia, unspecified: Secondary | ICD-10-CM | POA: Diagnosis not present

## 2017-03-09 DIAGNOSIS — N186 End stage renal disease: Secondary | ICD-10-CM | POA: Diagnosis not present

## 2017-03-11 DIAGNOSIS — D631 Anemia in chronic kidney disease: Secondary | ICD-10-CM | POA: Diagnosis not present

## 2017-03-11 DIAGNOSIS — N186 End stage renal disease: Secondary | ICD-10-CM | POA: Diagnosis not present

## 2017-03-11 DIAGNOSIS — D509 Iron deficiency anemia, unspecified: Secondary | ICD-10-CM | POA: Diagnosis not present

## 2017-03-11 DIAGNOSIS — Z992 Dependence on renal dialysis: Secondary | ICD-10-CM | POA: Diagnosis not present

## 2017-03-13 DIAGNOSIS — D631 Anemia in chronic kidney disease: Secondary | ICD-10-CM | POA: Diagnosis not present

## 2017-03-13 DIAGNOSIS — I10 Essential (primary) hypertension: Secondary | ICD-10-CM | POA: Diagnosis not present

## 2017-03-13 DIAGNOSIS — Z992 Dependence on renal dialysis: Secondary | ICD-10-CM | POA: Diagnosis not present

## 2017-03-13 DIAGNOSIS — D509 Iron deficiency anemia, unspecified: Secondary | ICD-10-CM | POA: Diagnosis not present

## 2017-03-13 DIAGNOSIS — N186 End stage renal disease: Secondary | ICD-10-CM | POA: Diagnosis not present

## 2017-03-13 DIAGNOSIS — M545 Low back pain: Secondary | ICD-10-CM | POA: Diagnosis not present

## 2017-03-16 DIAGNOSIS — D509 Iron deficiency anemia, unspecified: Secondary | ICD-10-CM | POA: Diagnosis not present

## 2017-03-16 DIAGNOSIS — Z992 Dependence on renal dialysis: Secondary | ICD-10-CM | POA: Diagnosis not present

## 2017-03-16 DIAGNOSIS — D631 Anemia in chronic kidney disease: Secondary | ICD-10-CM | POA: Diagnosis not present

## 2017-03-16 DIAGNOSIS — N186 End stage renal disease: Secondary | ICD-10-CM | POA: Diagnosis not present

## 2017-03-18 DIAGNOSIS — N186 End stage renal disease: Secondary | ICD-10-CM | POA: Diagnosis not present

## 2017-03-18 DIAGNOSIS — D509 Iron deficiency anemia, unspecified: Secondary | ICD-10-CM | POA: Diagnosis not present

## 2017-03-18 DIAGNOSIS — Z992 Dependence on renal dialysis: Secondary | ICD-10-CM | POA: Diagnosis not present

## 2017-03-18 DIAGNOSIS — D631 Anemia in chronic kidney disease: Secondary | ICD-10-CM | POA: Diagnosis not present

## 2017-03-19 DIAGNOSIS — N186 End stage renal disease: Secondary | ICD-10-CM | POA: Diagnosis not present

## 2017-03-19 DIAGNOSIS — Z992 Dependence on renal dialysis: Secondary | ICD-10-CM | POA: Diagnosis not present

## 2017-03-20 DIAGNOSIS — Z992 Dependence on renal dialysis: Secondary | ICD-10-CM | POA: Diagnosis not present

## 2017-03-20 DIAGNOSIS — D631 Anemia in chronic kidney disease: Secondary | ICD-10-CM | POA: Diagnosis not present

## 2017-03-20 DIAGNOSIS — D509 Iron deficiency anemia, unspecified: Secondary | ICD-10-CM | POA: Diagnosis not present

## 2017-03-20 DIAGNOSIS — N2581 Secondary hyperparathyroidism of renal origin: Secondary | ICD-10-CM | POA: Diagnosis not present

## 2017-03-20 DIAGNOSIS — N186 End stage renal disease: Secondary | ICD-10-CM | POA: Diagnosis not present

## 2017-03-23 DIAGNOSIS — D509 Iron deficiency anemia, unspecified: Secondary | ICD-10-CM | POA: Diagnosis not present

## 2017-03-23 DIAGNOSIS — N186 End stage renal disease: Secondary | ICD-10-CM | POA: Diagnosis not present

## 2017-03-23 DIAGNOSIS — N2581 Secondary hyperparathyroidism of renal origin: Secondary | ICD-10-CM | POA: Diagnosis not present

## 2017-03-23 DIAGNOSIS — Z992 Dependence on renal dialysis: Secondary | ICD-10-CM | POA: Diagnosis not present

## 2017-03-23 DIAGNOSIS — D631 Anemia in chronic kidney disease: Secondary | ICD-10-CM | POA: Diagnosis not present

## 2017-03-25 DIAGNOSIS — D631 Anemia in chronic kidney disease: Secondary | ICD-10-CM | POA: Diagnosis not present

## 2017-03-25 DIAGNOSIS — N2581 Secondary hyperparathyroidism of renal origin: Secondary | ICD-10-CM | POA: Diagnosis not present

## 2017-03-25 DIAGNOSIS — N186 End stage renal disease: Secondary | ICD-10-CM | POA: Diagnosis not present

## 2017-03-25 DIAGNOSIS — D509 Iron deficiency anemia, unspecified: Secondary | ICD-10-CM | POA: Diagnosis not present

## 2017-03-25 DIAGNOSIS — Z992 Dependence on renal dialysis: Secondary | ICD-10-CM | POA: Diagnosis not present

## 2017-03-27 DIAGNOSIS — N186 End stage renal disease: Secondary | ICD-10-CM | POA: Diagnosis not present

## 2017-03-27 DIAGNOSIS — D509 Iron deficiency anemia, unspecified: Secondary | ICD-10-CM | POA: Diagnosis not present

## 2017-03-27 DIAGNOSIS — I482 Chronic atrial fibrillation: Secondary | ICD-10-CM | POA: Diagnosis not present

## 2017-03-27 DIAGNOSIS — I959 Hypotension, unspecified: Secondary | ICD-10-CM | POA: Diagnosis not present

## 2017-03-27 DIAGNOSIS — D631 Anemia in chronic kidney disease: Secondary | ICD-10-CM | POA: Diagnosis not present

## 2017-03-27 DIAGNOSIS — Z992 Dependence on renal dialysis: Secondary | ICD-10-CM | POA: Diagnosis not present

## 2017-03-27 DIAGNOSIS — N2581 Secondary hyperparathyroidism of renal origin: Secondary | ICD-10-CM | POA: Diagnosis not present

## 2017-03-28 DIAGNOSIS — N19 Unspecified kidney failure: Secondary | ICD-10-CM | POA: Diagnosis not present

## 2017-03-28 DIAGNOSIS — Z7951 Long term (current) use of inhaled steroids: Secondary | ICD-10-CM | POA: Diagnosis not present

## 2017-03-28 DIAGNOSIS — F419 Anxiety disorder, unspecified: Secondary | ICD-10-CM | POA: Diagnosis not present

## 2017-03-28 DIAGNOSIS — Z7901 Long term (current) use of anticoagulants: Secondary | ICD-10-CM | POA: Diagnosis not present

## 2017-03-28 DIAGNOSIS — S51811A Laceration without foreign body of right forearm, initial encounter: Secondary | ICD-10-CM | POA: Diagnosis not present

## 2017-03-28 DIAGNOSIS — Z79899 Other long term (current) drug therapy: Secondary | ICD-10-CM | POA: Diagnosis not present

## 2017-03-28 DIAGNOSIS — I10 Essential (primary) hypertension: Secondary | ICD-10-CM | POA: Diagnosis not present

## 2017-03-28 DIAGNOSIS — X58XXXA Exposure to other specified factors, initial encounter: Secondary | ICD-10-CM | POA: Diagnosis not present

## 2017-03-28 DIAGNOSIS — Z992 Dependence on renal dialysis: Secondary | ICD-10-CM | POA: Diagnosis not present

## 2017-03-30 DIAGNOSIS — D631 Anemia in chronic kidney disease: Secondary | ICD-10-CM | POA: Diagnosis not present

## 2017-03-30 DIAGNOSIS — N2581 Secondary hyperparathyroidism of renal origin: Secondary | ICD-10-CM | POA: Diagnosis not present

## 2017-03-30 DIAGNOSIS — N186 End stage renal disease: Secondary | ICD-10-CM | POA: Diagnosis not present

## 2017-03-30 DIAGNOSIS — D509 Iron deficiency anemia, unspecified: Secondary | ICD-10-CM | POA: Diagnosis not present

## 2017-03-30 DIAGNOSIS — Z992 Dependence on renal dialysis: Secondary | ICD-10-CM | POA: Diagnosis not present

## 2017-03-31 DIAGNOSIS — Z452 Encounter for adjustment and management of vascular access device: Secondary | ICD-10-CM | POA: Diagnosis not present

## 2017-04-01 DIAGNOSIS — N186 End stage renal disease: Secondary | ICD-10-CM | POA: Diagnosis not present

## 2017-04-01 DIAGNOSIS — N2581 Secondary hyperparathyroidism of renal origin: Secondary | ICD-10-CM | POA: Diagnosis not present

## 2017-04-01 DIAGNOSIS — D631 Anemia in chronic kidney disease: Secondary | ICD-10-CM | POA: Diagnosis not present

## 2017-04-01 DIAGNOSIS — Z992 Dependence on renal dialysis: Secondary | ICD-10-CM | POA: Diagnosis not present

## 2017-04-01 DIAGNOSIS — D509 Iron deficiency anemia, unspecified: Secondary | ICD-10-CM | POA: Diagnosis not present

## 2017-04-03 DIAGNOSIS — Z992 Dependence on renal dialysis: Secondary | ICD-10-CM | POA: Diagnosis not present

## 2017-04-03 DIAGNOSIS — D631 Anemia in chronic kidney disease: Secondary | ICD-10-CM | POA: Diagnosis not present

## 2017-04-03 DIAGNOSIS — N186 End stage renal disease: Secondary | ICD-10-CM | POA: Diagnosis not present

## 2017-04-03 DIAGNOSIS — N2581 Secondary hyperparathyroidism of renal origin: Secondary | ICD-10-CM | POA: Diagnosis not present

## 2017-04-03 DIAGNOSIS — D509 Iron deficiency anemia, unspecified: Secondary | ICD-10-CM | POA: Diagnosis not present

## 2017-04-06 DIAGNOSIS — D509 Iron deficiency anemia, unspecified: Secondary | ICD-10-CM | POA: Diagnosis not present

## 2017-04-06 DIAGNOSIS — N186 End stage renal disease: Secondary | ICD-10-CM | POA: Diagnosis not present

## 2017-04-06 DIAGNOSIS — N2581 Secondary hyperparathyroidism of renal origin: Secondary | ICD-10-CM | POA: Diagnosis not present

## 2017-04-06 DIAGNOSIS — Z992 Dependence on renal dialysis: Secondary | ICD-10-CM | POA: Diagnosis not present

## 2017-04-06 DIAGNOSIS — D631 Anemia in chronic kidney disease: Secondary | ICD-10-CM | POA: Diagnosis not present

## 2017-04-08 DIAGNOSIS — D509 Iron deficiency anemia, unspecified: Secondary | ICD-10-CM | POA: Diagnosis not present

## 2017-04-08 DIAGNOSIS — N186 End stage renal disease: Secondary | ICD-10-CM | POA: Diagnosis not present

## 2017-04-08 DIAGNOSIS — N2581 Secondary hyperparathyroidism of renal origin: Secondary | ICD-10-CM | POA: Diagnosis not present

## 2017-04-08 DIAGNOSIS — Z992 Dependence on renal dialysis: Secondary | ICD-10-CM | POA: Diagnosis not present

## 2017-04-08 DIAGNOSIS — D631 Anemia in chronic kidney disease: Secondary | ICD-10-CM | POA: Diagnosis not present

## 2017-04-10 DIAGNOSIS — Z992 Dependence on renal dialysis: Secondary | ICD-10-CM | POA: Diagnosis not present

## 2017-04-10 DIAGNOSIS — N186 End stage renal disease: Secondary | ICD-10-CM | POA: Diagnosis not present

## 2017-04-10 DIAGNOSIS — N2581 Secondary hyperparathyroidism of renal origin: Secondary | ICD-10-CM | POA: Diagnosis not present

## 2017-04-10 DIAGNOSIS — D509 Iron deficiency anemia, unspecified: Secondary | ICD-10-CM | POA: Diagnosis not present

## 2017-04-10 DIAGNOSIS — D631 Anemia in chronic kidney disease: Secondary | ICD-10-CM | POA: Diagnosis not present

## 2017-04-13 DIAGNOSIS — Z992 Dependence on renal dialysis: Secondary | ICD-10-CM | POA: Diagnosis not present

## 2017-04-13 DIAGNOSIS — N2581 Secondary hyperparathyroidism of renal origin: Secondary | ICD-10-CM | POA: Diagnosis not present

## 2017-04-13 DIAGNOSIS — N186 End stage renal disease: Secondary | ICD-10-CM | POA: Diagnosis not present

## 2017-04-13 DIAGNOSIS — D631 Anemia in chronic kidney disease: Secondary | ICD-10-CM | POA: Diagnosis not present

## 2017-04-13 DIAGNOSIS — D509 Iron deficiency anemia, unspecified: Secondary | ICD-10-CM | POA: Diagnosis not present

## 2017-04-15 DIAGNOSIS — D631 Anemia in chronic kidney disease: Secondary | ICD-10-CM | POA: Diagnosis not present

## 2017-04-15 DIAGNOSIS — N186 End stage renal disease: Secondary | ICD-10-CM | POA: Diagnosis not present

## 2017-04-15 DIAGNOSIS — Z992 Dependence on renal dialysis: Secondary | ICD-10-CM | POA: Diagnosis not present

## 2017-04-15 DIAGNOSIS — D509 Iron deficiency anemia, unspecified: Secondary | ICD-10-CM | POA: Diagnosis not present

## 2017-04-15 DIAGNOSIS — N2581 Secondary hyperparathyroidism of renal origin: Secondary | ICD-10-CM | POA: Diagnosis not present

## 2017-04-17 DIAGNOSIS — Z992 Dependence on renal dialysis: Secondary | ICD-10-CM | POA: Diagnosis not present

## 2017-04-17 DIAGNOSIS — N186 End stage renal disease: Secondary | ICD-10-CM | POA: Diagnosis not present

## 2017-04-17 DIAGNOSIS — N2581 Secondary hyperparathyroidism of renal origin: Secondary | ICD-10-CM | POA: Diagnosis not present

## 2017-04-17 DIAGNOSIS — D631 Anemia in chronic kidney disease: Secondary | ICD-10-CM | POA: Diagnosis not present

## 2017-04-17 DIAGNOSIS — D509 Iron deficiency anemia, unspecified: Secondary | ICD-10-CM | POA: Diagnosis not present

## 2017-04-18 DIAGNOSIS — Z992 Dependence on renal dialysis: Secondary | ICD-10-CM | POA: Diagnosis not present

## 2017-04-18 DIAGNOSIS — N186 End stage renal disease: Secondary | ICD-10-CM | POA: Diagnosis not present

## 2017-04-20 DIAGNOSIS — D509 Iron deficiency anemia, unspecified: Secondary | ICD-10-CM | POA: Diagnosis not present

## 2017-04-20 DIAGNOSIS — Z992 Dependence on renal dialysis: Secondary | ICD-10-CM | POA: Diagnosis not present

## 2017-04-20 DIAGNOSIS — D631 Anemia in chronic kidney disease: Secondary | ICD-10-CM | POA: Diagnosis not present

## 2017-04-20 DIAGNOSIS — N186 End stage renal disease: Secondary | ICD-10-CM | POA: Diagnosis not present

## 2017-04-22 DIAGNOSIS — Z992 Dependence on renal dialysis: Secondary | ICD-10-CM | POA: Diagnosis not present

## 2017-04-22 DIAGNOSIS — D631 Anemia in chronic kidney disease: Secondary | ICD-10-CM | POA: Diagnosis not present

## 2017-04-22 DIAGNOSIS — D509 Iron deficiency anemia, unspecified: Secondary | ICD-10-CM | POA: Diagnosis not present

## 2017-04-22 DIAGNOSIS — N186 End stage renal disease: Secondary | ICD-10-CM | POA: Diagnosis not present

## 2017-04-24 DIAGNOSIS — Z992 Dependence on renal dialysis: Secondary | ICD-10-CM | POA: Diagnosis not present

## 2017-04-24 DIAGNOSIS — N186 End stage renal disease: Secondary | ICD-10-CM | POA: Diagnosis not present

## 2017-04-24 DIAGNOSIS — D631 Anemia in chronic kidney disease: Secondary | ICD-10-CM | POA: Diagnosis not present

## 2017-04-24 DIAGNOSIS — D509 Iron deficiency anemia, unspecified: Secondary | ICD-10-CM | POA: Diagnosis not present

## 2017-04-27 DIAGNOSIS — D509 Iron deficiency anemia, unspecified: Secondary | ICD-10-CM | POA: Diagnosis not present

## 2017-04-27 DIAGNOSIS — N186 End stage renal disease: Secondary | ICD-10-CM | POA: Diagnosis not present

## 2017-04-27 DIAGNOSIS — D631 Anemia in chronic kidney disease: Secondary | ICD-10-CM | POA: Diagnosis not present

## 2017-04-27 DIAGNOSIS — Z992 Dependence on renal dialysis: Secondary | ICD-10-CM | POA: Diagnosis not present

## 2017-04-29 DIAGNOSIS — N186 End stage renal disease: Secondary | ICD-10-CM | POA: Diagnosis not present

## 2017-04-29 DIAGNOSIS — D631 Anemia in chronic kidney disease: Secondary | ICD-10-CM | POA: Diagnosis not present

## 2017-04-29 DIAGNOSIS — D509 Iron deficiency anemia, unspecified: Secondary | ICD-10-CM | POA: Diagnosis not present

## 2017-04-29 DIAGNOSIS — Z992 Dependence on renal dialysis: Secondary | ICD-10-CM | POA: Diagnosis not present

## 2017-04-30 DIAGNOSIS — Z452 Encounter for adjustment and management of vascular access device: Secondary | ICD-10-CM | POA: Diagnosis not present

## 2017-05-01 DIAGNOSIS — D509 Iron deficiency anemia, unspecified: Secondary | ICD-10-CM | POA: Diagnosis not present

## 2017-05-01 DIAGNOSIS — Z992 Dependence on renal dialysis: Secondary | ICD-10-CM | POA: Diagnosis not present

## 2017-05-01 DIAGNOSIS — N186 End stage renal disease: Secondary | ICD-10-CM | POA: Diagnosis not present

## 2017-05-01 DIAGNOSIS — D631 Anemia in chronic kidney disease: Secondary | ICD-10-CM | POA: Diagnosis not present

## 2017-05-04 DIAGNOSIS — D509 Iron deficiency anemia, unspecified: Secondary | ICD-10-CM | POA: Diagnosis not present

## 2017-05-04 DIAGNOSIS — D631 Anemia in chronic kidney disease: Secondary | ICD-10-CM | POA: Diagnosis not present

## 2017-05-04 DIAGNOSIS — N186 End stage renal disease: Secondary | ICD-10-CM | POA: Diagnosis not present

## 2017-05-04 DIAGNOSIS — Z992 Dependence on renal dialysis: Secondary | ICD-10-CM | POA: Diagnosis not present

## 2017-05-06 DIAGNOSIS — Z992 Dependence on renal dialysis: Secondary | ICD-10-CM | POA: Diagnosis not present

## 2017-05-06 DIAGNOSIS — D631 Anemia in chronic kidney disease: Secondary | ICD-10-CM | POA: Diagnosis not present

## 2017-05-06 DIAGNOSIS — N186 End stage renal disease: Secondary | ICD-10-CM | POA: Diagnosis not present

## 2017-05-06 DIAGNOSIS — D509 Iron deficiency anemia, unspecified: Secondary | ICD-10-CM | POA: Diagnosis not present

## 2017-05-07 DIAGNOSIS — I10 Essential (primary) hypertension: Secondary | ICD-10-CM | POA: Diagnosis not present

## 2017-05-07 DIAGNOSIS — J440 Chronic obstructive pulmonary disease with acute lower respiratory infection: Secondary | ICD-10-CM | POA: Diagnosis not present

## 2017-05-07 DIAGNOSIS — M545 Low back pain: Secondary | ICD-10-CM | POA: Diagnosis not present

## 2017-05-07 DIAGNOSIS — I358 Other nonrheumatic aortic valve disorders: Secondary | ICD-10-CM | POA: Diagnosis not present

## 2017-05-07 DIAGNOSIS — I481 Persistent atrial fibrillation: Secondary | ICD-10-CM | POA: Diagnosis not present

## 2017-05-07 DIAGNOSIS — Z79899 Other long term (current) drug therapy: Secondary | ICD-10-CM | POA: Diagnosis not present

## 2017-05-07 DIAGNOSIS — Z89511 Acquired absence of right leg below knee: Secondary | ICD-10-CM | POA: Diagnosis not present

## 2017-05-07 DIAGNOSIS — Z89512 Acquired absence of left leg below knee: Secondary | ICD-10-CM | POA: Diagnosis not present

## 2017-05-08 DIAGNOSIS — Z992 Dependence on renal dialysis: Secondary | ICD-10-CM | POA: Diagnosis not present

## 2017-05-08 DIAGNOSIS — D509 Iron deficiency anemia, unspecified: Secondary | ICD-10-CM | POA: Diagnosis not present

## 2017-05-08 DIAGNOSIS — N186 End stage renal disease: Secondary | ICD-10-CM | POA: Diagnosis not present

## 2017-05-08 DIAGNOSIS — D631 Anemia in chronic kidney disease: Secondary | ICD-10-CM | POA: Diagnosis not present

## 2017-05-11 DIAGNOSIS — D631 Anemia in chronic kidney disease: Secondary | ICD-10-CM | POA: Diagnosis not present

## 2017-05-11 DIAGNOSIS — N186 End stage renal disease: Secondary | ICD-10-CM | POA: Diagnosis not present

## 2017-05-11 DIAGNOSIS — Z992 Dependence on renal dialysis: Secondary | ICD-10-CM | POA: Diagnosis not present

## 2017-05-11 DIAGNOSIS — D509 Iron deficiency anemia, unspecified: Secondary | ICD-10-CM | POA: Diagnosis not present

## 2017-05-13 DIAGNOSIS — D631 Anemia in chronic kidney disease: Secondary | ICD-10-CM | POA: Diagnosis not present

## 2017-05-13 DIAGNOSIS — D509 Iron deficiency anemia, unspecified: Secondary | ICD-10-CM | POA: Diagnosis not present

## 2017-05-13 DIAGNOSIS — Z992 Dependence on renal dialysis: Secondary | ICD-10-CM | POA: Diagnosis not present

## 2017-05-13 DIAGNOSIS — N186 End stage renal disease: Secondary | ICD-10-CM | POA: Diagnosis not present

## 2017-05-15 DIAGNOSIS — N186 End stage renal disease: Secondary | ICD-10-CM | POA: Diagnosis not present

## 2017-05-15 DIAGNOSIS — D631 Anemia in chronic kidney disease: Secondary | ICD-10-CM | POA: Diagnosis not present

## 2017-05-15 DIAGNOSIS — D509 Iron deficiency anemia, unspecified: Secondary | ICD-10-CM | POA: Diagnosis not present

## 2017-05-15 DIAGNOSIS — Z992 Dependence on renal dialysis: Secondary | ICD-10-CM | POA: Diagnosis not present

## 2017-05-18 DIAGNOSIS — D509 Iron deficiency anemia, unspecified: Secondary | ICD-10-CM | POA: Diagnosis not present

## 2017-05-18 DIAGNOSIS — D631 Anemia in chronic kidney disease: Secondary | ICD-10-CM | POA: Diagnosis not present

## 2017-05-18 DIAGNOSIS — Z992 Dependence on renal dialysis: Secondary | ICD-10-CM | POA: Diagnosis not present

## 2017-05-18 DIAGNOSIS — N186 End stage renal disease: Secondary | ICD-10-CM | POA: Diagnosis not present

## 2017-05-19 DIAGNOSIS — Z992 Dependence on renal dialysis: Secondary | ICD-10-CM | POA: Diagnosis not present

## 2017-05-19 DIAGNOSIS — N186 End stage renal disease: Secondary | ICD-10-CM | POA: Diagnosis not present

## 2017-05-20 DIAGNOSIS — D509 Iron deficiency anemia, unspecified: Secondary | ICD-10-CM | POA: Diagnosis not present

## 2017-05-20 DIAGNOSIS — D631 Anemia in chronic kidney disease: Secondary | ICD-10-CM | POA: Diagnosis not present

## 2017-05-20 DIAGNOSIS — Z992 Dependence on renal dialysis: Secondary | ICD-10-CM | POA: Diagnosis not present

## 2017-05-20 DIAGNOSIS — N186 End stage renal disease: Secondary | ICD-10-CM | POA: Diagnosis not present

## 2017-05-22 DIAGNOSIS — D631 Anemia in chronic kidney disease: Secondary | ICD-10-CM | POA: Diagnosis not present

## 2017-05-22 DIAGNOSIS — D509 Iron deficiency anemia, unspecified: Secondary | ICD-10-CM | POA: Diagnosis not present

## 2017-05-22 DIAGNOSIS — Z992 Dependence on renal dialysis: Secondary | ICD-10-CM | POA: Diagnosis not present

## 2017-05-22 DIAGNOSIS — N186 End stage renal disease: Secondary | ICD-10-CM | POA: Diagnosis not present

## 2017-05-25 DIAGNOSIS — N186 End stage renal disease: Secondary | ICD-10-CM | POA: Diagnosis not present

## 2017-05-25 DIAGNOSIS — D509 Iron deficiency anemia, unspecified: Secondary | ICD-10-CM | POA: Diagnosis not present

## 2017-05-25 DIAGNOSIS — D631 Anemia in chronic kidney disease: Secondary | ICD-10-CM | POA: Diagnosis not present

## 2017-05-25 DIAGNOSIS — Z992 Dependence on renal dialysis: Secondary | ICD-10-CM | POA: Diagnosis not present

## 2017-05-27 DIAGNOSIS — D631 Anemia in chronic kidney disease: Secondary | ICD-10-CM | POA: Diagnosis not present

## 2017-05-27 DIAGNOSIS — N186 End stage renal disease: Secondary | ICD-10-CM | POA: Diagnosis not present

## 2017-05-27 DIAGNOSIS — Z992 Dependence on renal dialysis: Secondary | ICD-10-CM | POA: Diagnosis not present

## 2017-05-27 DIAGNOSIS — D509 Iron deficiency anemia, unspecified: Secondary | ICD-10-CM | POA: Diagnosis not present

## 2017-05-29 DIAGNOSIS — D509 Iron deficiency anemia, unspecified: Secondary | ICD-10-CM | POA: Diagnosis not present

## 2017-05-29 DIAGNOSIS — Z992 Dependence on renal dialysis: Secondary | ICD-10-CM | POA: Diagnosis not present

## 2017-05-29 DIAGNOSIS — D631 Anemia in chronic kidney disease: Secondary | ICD-10-CM | POA: Diagnosis not present

## 2017-05-29 DIAGNOSIS — N186 End stage renal disease: Secondary | ICD-10-CM | POA: Diagnosis not present

## 2017-05-31 DIAGNOSIS — Z66 Do not resuscitate: Secondary | ICD-10-CM | POA: Diagnosis not present

## 2017-05-31 DIAGNOSIS — E892 Postprocedural hypoparathyroidism: Secondary | ICD-10-CM | POA: Diagnosis not present

## 2017-05-31 DIAGNOSIS — J449 Chronic obstructive pulmonary disease, unspecified: Secondary | ICD-10-CM | POA: Diagnosis present

## 2017-05-31 DIAGNOSIS — I129 Hypertensive chronic kidney disease with stage 1 through stage 4 chronic kidney disease, or unspecified chronic kidney disease: Secondary | ICD-10-CM | POA: Diagnosis not present

## 2017-05-31 DIAGNOSIS — F17210 Nicotine dependence, cigarettes, uncomplicated: Secondary | ICD-10-CM | POA: Diagnosis present

## 2017-05-31 DIAGNOSIS — R55 Syncope and collapse: Secondary | ICD-10-CM | POA: Diagnosis not present

## 2017-05-31 DIAGNOSIS — I959 Hypotension, unspecified: Secondary | ICD-10-CM | POA: Diagnosis present

## 2017-05-31 DIAGNOSIS — Z7901 Long term (current) use of anticoagulants: Secondary | ICD-10-CM | POA: Diagnosis not present

## 2017-05-31 DIAGNOSIS — R42 Dizziness and giddiness: Secondary | ICD-10-CM | POA: Diagnosis not present

## 2017-05-31 DIAGNOSIS — E875 Hyperkalemia: Secondary | ICD-10-CM | POA: Diagnosis present

## 2017-05-31 DIAGNOSIS — Z992 Dependence on renal dialysis: Secondary | ICD-10-CM | POA: Diagnosis not present

## 2017-05-31 DIAGNOSIS — N2581 Secondary hyperparathyroidism of renal origin: Secondary | ICD-10-CM | POA: Diagnosis not present

## 2017-05-31 DIAGNOSIS — E890 Postprocedural hypothyroidism: Secondary | ICD-10-CM | POA: Diagnosis not present

## 2017-05-31 DIAGNOSIS — Z950 Presence of cardiac pacemaker: Secondary | ICD-10-CM | POA: Diagnosis not present

## 2017-05-31 DIAGNOSIS — D631 Anemia in chronic kidney disease: Secondary | ICD-10-CM | POA: Diagnosis present

## 2017-05-31 DIAGNOSIS — Z7982 Long term (current) use of aspirin: Secondary | ICD-10-CM | POA: Diagnosis not present

## 2017-05-31 DIAGNOSIS — R9431 Abnormal electrocardiogram [ECG] [EKG]: Secondary | ICD-10-CM | POA: Diagnosis not present

## 2017-05-31 DIAGNOSIS — Z79899 Other long term (current) drug therapy: Secondary | ICD-10-CM | POA: Diagnosis not present

## 2017-05-31 DIAGNOSIS — J181 Lobar pneumonia, unspecified organism: Secondary | ICD-10-CM | POA: Diagnosis not present

## 2017-05-31 DIAGNOSIS — J45909 Unspecified asthma, uncomplicated: Secondary | ICD-10-CM | POA: Diagnosis not present

## 2017-05-31 DIAGNOSIS — R918 Other nonspecific abnormal finding of lung field: Secondary | ICD-10-CM | POA: Diagnosis not present

## 2017-05-31 DIAGNOSIS — G8929 Other chronic pain: Secondary | ICD-10-CM | POA: Diagnosis present

## 2017-05-31 DIAGNOSIS — E271 Primary adrenocortical insufficiency: Secondary | ICD-10-CM | POA: Diagnosis present

## 2017-05-31 DIAGNOSIS — D689 Coagulation defect, unspecified: Secondary | ICD-10-CM | POA: Diagnosis present

## 2017-05-31 DIAGNOSIS — F329 Major depressive disorder, single episode, unspecified: Secondary | ICD-10-CM | POA: Diagnosis not present

## 2017-05-31 DIAGNOSIS — J189 Pneumonia, unspecified organism: Secondary | ICD-10-CM | POA: Diagnosis not present

## 2017-05-31 DIAGNOSIS — F419 Anxiety disorder, unspecified: Secondary | ICD-10-CM | POA: Diagnosis present

## 2017-05-31 DIAGNOSIS — I442 Atrioventricular block, complete: Secondary | ICD-10-CM | POA: Diagnosis present

## 2017-05-31 DIAGNOSIS — R000 Tachycardia, unspecified: Secondary | ICD-10-CM | POA: Diagnosis not present

## 2017-05-31 DIAGNOSIS — Z8673 Personal history of transient ischemic attack (TIA), and cerebral infarction without residual deficits: Secondary | ICD-10-CM | POA: Diagnosis not present

## 2017-05-31 DIAGNOSIS — N189 Chronic kidney disease, unspecified: Secondary | ICD-10-CM | POA: Diagnosis not present

## 2017-05-31 DIAGNOSIS — I120 Hypertensive chronic kidney disease with stage 5 chronic kidney disease or end stage renal disease: Secondary | ICD-10-CM | POA: Diagnosis present

## 2017-05-31 DIAGNOSIS — I249 Acute ischemic heart disease, unspecified: Secondary | ICD-10-CM | POA: Diagnosis not present

## 2017-05-31 DIAGNOSIS — I447 Left bundle-branch block, unspecified: Secondary | ICD-10-CM | POA: Diagnosis not present

## 2017-05-31 DIAGNOSIS — I252 Old myocardial infarction: Secondary | ICD-10-CM | POA: Diagnosis not present

## 2017-05-31 DIAGNOSIS — Z952 Presence of prosthetic heart valve: Secondary | ICD-10-CM | POA: Diagnosis not present

## 2017-05-31 DIAGNOSIS — I454 Nonspecific intraventricular block: Secondary | ICD-10-CM | POA: Diagnosis not present

## 2017-05-31 DIAGNOSIS — Z89612 Acquired absence of left leg above knee: Secondary | ICD-10-CM | POA: Diagnosis not present

## 2017-05-31 DIAGNOSIS — N179 Acute kidney failure, unspecified: Secondary | ICD-10-CM | POA: Diagnosis not present

## 2017-05-31 DIAGNOSIS — Z885 Allergy status to narcotic agent status: Secondary | ICD-10-CM | POA: Diagnosis not present

## 2017-05-31 DIAGNOSIS — Z7952 Long term (current) use of systemic steroids: Secondary | ICD-10-CM | POA: Diagnosis not present

## 2017-05-31 DIAGNOSIS — I4891 Unspecified atrial fibrillation: Secondary | ICD-10-CM | POA: Diagnosis not present

## 2017-05-31 DIAGNOSIS — Z006 Encounter for examination for normal comparison and control in clinical research program: Secondary | ICD-10-CM | POA: Diagnosis not present

## 2017-05-31 DIAGNOSIS — R079 Chest pain, unspecified: Secondary | ICD-10-CM | POA: Diagnosis not present

## 2017-05-31 DIAGNOSIS — Z89611 Acquired absence of right leg above knee: Secondary | ICD-10-CM | POA: Diagnosis not present

## 2017-05-31 DIAGNOSIS — J90 Pleural effusion, not elsewhere classified: Secondary | ICD-10-CM | POA: Diagnosis not present

## 2017-05-31 DIAGNOSIS — L89152 Pressure ulcer of sacral region, stage 2: Secondary | ICD-10-CM | POA: Diagnosis present

## 2017-05-31 DIAGNOSIS — I209 Angina pectoris, unspecified: Secondary | ICD-10-CM | POA: Diagnosis present

## 2017-05-31 DIAGNOSIS — N186 End stage renal disease: Secondary | ICD-10-CM | POA: Diagnosis present

## 2017-05-31 DIAGNOSIS — R062 Wheezing: Secondary | ICD-10-CM | POA: Diagnosis not present

## 2017-05-31 DIAGNOSIS — Z7951 Long term (current) use of inhaled steroids: Secondary | ICD-10-CM | POA: Diagnosis not present

## 2017-05-31 DIAGNOSIS — R21 Rash and other nonspecific skin eruption: Secondary | ICD-10-CM | POA: Diagnosis not present

## 2017-05-31 DIAGNOSIS — J810 Acute pulmonary edema: Secondary | ICD-10-CM | POA: Diagnosis not present

## 2017-05-31 DIAGNOSIS — R0602 Shortness of breath: Secondary | ICD-10-CM | POA: Diagnosis not present

## 2017-05-31 DIAGNOSIS — Z45018 Encounter for adjustment and management of other part of cardiac pacemaker: Secondary | ICD-10-CM | POA: Diagnosis not present

## 2017-05-31 DIAGNOSIS — E871 Hypo-osmolality and hyponatremia: Secondary | ICD-10-CM | POA: Diagnosis not present

## 2017-05-31 DIAGNOSIS — Z905 Acquired absence of kidney: Secondary | ICD-10-CM | POA: Diagnosis not present

## 2017-05-31 DIAGNOSIS — E039 Hypothyroidism, unspecified: Secondary | ICD-10-CM | POA: Diagnosis not present

## 2017-05-31 DIAGNOSIS — I4581 Long QT syndrome: Secondary | ICD-10-CM | POA: Diagnosis not present

## 2017-06-08 DIAGNOSIS — I481 Persistent atrial fibrillation: Secondary | ICD-10-CM | POA: Diagnosis not present

## 2017-06-09 DIAGNOSIS — R42 Dizziness and giddiness: Secondary | ICD-10-CM | POA: Diagnosis not present

## 2017-06-09 DIAGNOSIS — I120 Hypertensive chronic kidney disease with stage 5 chronic kidney disease or end stage renal disease: Secondary | ICD-10-CM | POA: Diagnosis present

## 2017-06-09 DIAGNOSIS — D539 Nutritional anemia, unspecified: Secondary | ICD-10-CM | POA: Diagnosis not present

## 2017-06-09 DIAGNOSIS — R7989 Other specified abnormal findings of blood chemistry: Secondary | ICD-10-CM | POA: Diagnosis not present

## 2017-06-09 DIAGNOSIS — M2578 Osteophyte, vertebrae: Secondary | ICD-10-CM | POA: Diagnosis not present

## 2017-06-09 DIAGNOSIS — Z89512 Acquired absence of left leg below knee: Secondary | ICD-10-CM | POA: Diagnosis not present

## 2017-06-09 DIAGNOSIS — R079 Chest pain, unspecified: Secondary | ICD-10-CM | POA: Diagnosis not present

## 2017-06-09 DIAGNOSIS — F419 Anxiety disorder, unspecified: Secondary | ICD-10-CM | POA: Diagnosis present

## 2017-06-09 DIAGNOSIS — I959 Hypotension, unspecified: Secondary | ICD-10-CM | POA: Diagnosis not present

## 2017-06-09 DIAGNOSIS — R9430 Abnormal result of cardiovascular function study, unspecified: Secondary | ICD-10-CM | POA: Diagnosis not present

## 2017-06-09 DIAGNOSIS — N186 End stage renal disease: Secondary | ICD-10-CM | POA: Diagnosis not present

## 2017-06-09 DIAGNOSIS — Z950 Presence of cardiac pacemaker: Secondary | ICD-10-CM | POA: Diagnosis not present

## 2017-06-09 DIAGNOSIS — I517 Cardiomegaly: Secondary | ICD-10-CM | POA: Diagnosis not present

## 2017-06-09 DIAGNOSIS — R200 Anesthesia of skin: Secondary | ICD-10-CM | POA: Diagnosis not present

## 2017-06-09 DIAGNOSIS — E875 Hyperkalemia: Secondary | ICD-10-CM | POA: Diagnosis not present

## 2017-06-09 DIAGNOSIS — N185 Chronic kidney disease, stage 5: Secondary | ICD-10-CM | POA: Diagnosis not present

## 2017-06-09 DIAGNOSIS — R21 Rash and other nonspecific skin eruption: Secondary | ICD-10-CM | POA: Diagnosis not present

## 2017-06-09 DIAGNOSIS — Z7901 Long term (current) use of anticoagulants: Secondary | ICD-10-CM | POA: Diagnosis not present

## 2017-06-09 DIAGNOSIS — I447 Left bundle-branch block, unspecified: Secondary | ICD-10-CM | POA: Diagnosis not present

## 2017-06-09 DIAGNOSIS — R0602 Shortness of breath: Secondary | ICD-10-CM | POA: Diagnosis not present

## 2017-06-09 DIAGNOSIS — Z905 Acquired absence of kidney: Secondary | ICD-10-CM | POA: Diagnosis not present

## 2017-06-09 DIAGNOSIS — J9811 Atelectasis: Secondary | ICD-10-CM | POA: Diagnosis present

## 2017-06-09 DIAGNOSIS — Z7952 Long term (current) use of systemic steroids: Secondary | ICD-10-CM | POA: Diagnosis not present

## 2017-06-09 DIAGNOSIS — M8580 Other specified disorders of bone density and structure, unspecified site: Secondary | ICD-10-CM | POA: Diagnosis not present

## 2017-06-09 DIAGNOSIS — R001 Bradycardia, unspecified: Secondary | ICD-10-CM | POA: Diagnosis not present

## 2017-06-09 DIAGNOSIS — K219 Gastro-esophageal reflux disease without esophagitis: Secondary | ICD-10-CM | POA: Diagnosis not present

## 2017-06-09 DIAGNOSIS — Z7982 Long term (current) use of aspirin: Secondary | ICD-10-CM | POA: Diagnosis not present

## 2017-06-09 DIAGNOSIS — R202 Paresthesia of skin: Secondary | ICD-10-CM | POA: Diagnosis not present

## 2017-06-09 DIAGNOSIS — M47812 Spondylosis without myelopathy or radiculopathy, cervical region: Secondary | ICD-10-CM | POA: Diagnosis not present

## 2017-06-09 DIAGNOSIS — E271 Primary adrenocortical insufficiency: Secondary | ICD-10-CM | POA: Diagnosis not present

## 2017-06-09 DIAGNOSIS — J449 Chronic obstructive pulmonary disease, unspecified: Secondary | ICD-10-CM | POA: Diagnosis not present

## 2017-06-09 DIAGNOSIS — I442 Atrioventricular block, complete: Secondary | ICD-10-CM | POA: Diagnosis not present

## 2017-06-09 DIAGNOSIS — Z89511 Acquired absence of right leg below knee: Secondary | ICD-10-CM | POA: Diagnosis not present

## 2017-06-09 DIAGNOSIS — D649 Anemia, unspecified: Secondary | ICD-10-CM | POA: Diagnosis not present

## 2017-06-09 DIAGNOSIS — Z992 Dependence on renal dialysis: Secondary | ICD-10-CM | POA: Diagnosis not present

## 2017-06-09 DIAGNOSIS — R1013 Epigastric pain: Secondary | ICD-10-CM | POA: Diagnosis not present

## 2017-06-09 DIAGNOSIS — Z79899 Other long term (current) drug therapy: Secondary | ICD-10-CM | POA: Diagnosis not present

## 2017-06-09 DIAGNOSIS — Z7951 Long term (current) use of inhaled steroids: Secondary | ICD-10-CM | POA: Diagnosis not present

## 2017-06-09 DIAGNOSIS — M5030 Other cervical disc degeneration, unspecified cervical region: Secondary | ICD-10-CM | POA: Diagnosis not present

## 2017-06-09 DIAGNOSIS — M542 Cervicalgia: Secondary | ICD-10-CM | POA: Diagnosis not present

## 2017-06-09 DIAGNOSIS — M792 Neuralgia and neuritis, unspecified: Secondary | ICD-10-CM | POA: Diagnosis not present

## 2017-06-09 DIAGNOSIS — D689 Coagulation defect, unspecified: Secondary | ICD-10-CM | POA: Diagnosis not present

## 2017-06-09 DIAGNOSIS — D631 Anemia in chronic kidney disease: Secondary | ICD-10-CM | POA: Diagnosis not present

## 2017-06-09 DIAGNOSIS — I998 Other disorder of circulatory system: Secondary | ICD-10-CM | POA: Diagnosis not present

## 2017-06-09 DIAGNOSIS — F17210 Nicotine dependence, cigarettes, uncomplicated: Secondary | ICD-10-CM | POA: Diagnosis present

## 2017-06-09 DIAGNOSIS — Z954 Presence of other heart-valve replacement: Secondary | ICD-10-CM | POA: Diagnosis not present

## 2017-06-09 DIAGNOSIS — Z952 Presence of prosthetic heart valve: Secondary | ICD-10-CM | POA: Diagnosis not present

## 2017-06-09 DIAGNOSIS — H8110 Benign paroxysmal vertigo, unspecified ear: Secondary | ICD-10-CM | POA: Diagnosis not present

## 2017-06-09 DIAGNOSIS — Z9089 Acquired absence of other organs: Secondary | ICD-10-CM | POA: Diagnosis not present

## 2017-06-09 DIAGNOSIS — Z4682 Encounter for fitting and adjustment of non-vascular catheter: Secondary | ICD-10-CM | POA: Diagnosis not present

## 2017-06-09 DIAGNOSIS — E871 Hypo-osmolality and hyponatremia: Secondary | ICD-10-CM | POA: Diagnosis not present

## 2017-06-09 DIAGNOSIS — B354 Tinea corporis: Secondary | ICD-10-CM | POA: Diagnosis not present

## 2017-06-09 DIAGNOSIS — R0789 Other chest pain: Secondary | ICD-10-CM | POA: Diagnosis not present

## 2017-06-09 DIAGNOSIS — I252 Old myocardial infarction: Secondary | ICD-10-CM | POA: Diagnosis not present

## 2017-06-09 DIAGNOSIS — J90 Pleural effusion, not elsewhere classified: Secondary | ICD-10-CM | POA: Diagnosis present

## 2017-06-09 DIAGNOSIS — R072 Precordial pain: Secondary | ICD-10-CM | POA: Diagnosis not present

## 2017-06-09 DIAGNOSIS — M50322 Other cervical disc degeneration at C5-C6 level: Secondary | ICD-10-CM | POA: Diagnosis not present

## 2017-06-09 DIAGNOSIS — Z794 Long term (current) use of insulin: Secondary | ICD-10-CM | POA: Diagnosis not present

## 2017-06-09 DIAGNOSIS — D531 Other megaloblastic anemias, not elsewhere classified: Secondary | ICD-10-CM | POA: Diagnosis not present

## 2017-06-09 DIAGNOSIS — R110 Nausea: Secondary | ICD-10-CM | POA: Diagnosis not present

## 2017-06-09 DIAGNOSIS — R791 Abnormal coagulation profile: Secondary | ICD-10-CM | POA: Diagnosis not present

## 2017-06-09 DIAGNOSIS — I498 Other specified cardiac arrhythmias: Secondary | ICD-10-CM | POA: Diagnosis not present

## 2017-06-09 DIAGNOSIS — F329 Major depressive disorder, single episode, unspecified: Secondary | ICD-10-CM | POA: Diagnosis present

## 2017-06-12 DIAGNOSIS — I447 Left bundle-branch block, unspecified: Secondary | ICD-10-CM | POA: Diagnosis not present

## 2017-06-15 DIAGNOSIS — Z992 Dependence on renal dialysis: Secondary | ICD-10-CM | POA: Diagnosis not present

## 2017-06-15 DIAGNOSIS — N186 End stage renal disease: Secondary | ICD-10-CM | POA: Diagnosis not present

## 2017-06-15 DIAGNOSIS — D631 Anemia in chronic kidney disease: Secondary | ICD-10-CM | POA: Diagnosis not present

## 2017-06-15 DIAGNOSIS — D509 Iron deficiency anemia, unspecified: Secondary | ICD-10-CM | POA: Diagnosis not present

## 2017-06-17 DIAGNOSIS — D509 Iron deficiency anemia, unspecified: Secondary | ICD-10-CM | POA: Diagnosis not present

## 2017-06-17 DIAGNOSIS — D631 Anemia in chronic kidney disease: Secondary | ICD-10-CM | POA: Diagnosis not present

## 2017-06-17 DIAGNOSIS — Z992 Dependence on renal dialysis: Secondary | ICD-10-CM | POA: Diagnosis not present

## 2017-06-17 DIAGNOSIS — N186 End stage renal disease: Secondary | ICD-10-CM | POA: Diagnosis not present

## 2017-06-18 DIAGNOSIS — N186 End stage renal disease: Secondary | ICD-10-CM | POA: Diagnosis not present

## 2017-06-18 DIAGNOSIS — Z992 Dependence on renal dialysis: Secondary | ICD-10-CM | POA: Diagnosis not present

## 2017-06-19 DIAGNOSIS — N186 End stage renal disease: Secondary | ICD-10-CM | POA: Diagnosis not present

## 2017-06-19 DIAGNOSIS — Z992 Dependence on renal dialysis: Secondary | ICD-10-CM | POA: Diagnosis not present

## 2017-06-19 DIAGNOSIS — D631 Anemia in chronic kidney disease: Secondary | ICD-10-CM | POA: Diagnosis not present

## 2017-06-19 DIAGNOSIS — D509 Iron deficiency anemia, unspecified: Secondary | ICD-10-CM | POA: Diagnosis not present

## 2017-06-22 DIAGNOSIS — D631 Anemia in chronic kidney disease: Secondary | ICD-10-CM | POA: Diagnosis not present

## 2017-06-22 DIAGNOSIS — Z992 Dependence on renal dialysis: Secondary | ICD-10-CM | POA: Diagnosis not present

## 2017-06-22 DIAGNOSIS — N2581 Secondary hyperparathyroidism of renal origin: Secondary | ICD-10-CM | POA: Diagnosis not present

## 2017-06-22 DIAGNOSIS — N186 End stage renal disease: Secondary | ICD-10-CM | POA: Diagnosis not present

## 2017-06-22 DIAGNOSIS — D509 Iron deficiency anemia, unspecified: Secondary | ICD-10-CM | POA: Diagnosis not present

## 2017-06-24 DIAGNOSIS — D509 Iron deficiency anemia, unspecified: Secondary | ICD-10-CM | POA: Diagnosis not present

## 2017-06-24 DIAGNOSIS — Z992 Dependence on renal dialysis: Secondary | ICD-10-CM | POA: Diagnosis not present

## 2017-06-24 DIAGNOSIS — N2581 Secondary hyperparathyroidism of renal origin: Secondary | ICD-10-CM | POA: Diagnosis not present

## 2017-06-24 DIAGNOSIS — D631 Anemia in chronic kidney disease: Secondary | ICD-10-CM | POA: Diagnosis not present

## 2017-06-24 DIAGNOSIS — N186 End stage renal disease: Secondary | ICD-10-CM | POA: Diagnosis not present

## 2017-06-26 DIAGNOSIS — N2581 Secondary hyperparathyroidism of renal origin: Secondary | ICD-10-CM | POA: Diagnosis not present

## 2017-06-26 DIAGNOSIS — N186 End stage renal disease: Secondary | ICD-10-CM | POA: Diagnosis not present

## 2017-06-26 DIAGNOSIS — D631 Anemia in chronic kidney disease: Secondary | ICD-10-CM | POA: Diagnosis not present

## 2017-06-26 DIAGNOSIS — Z992 Dependence on renal dialysis: Secondary | ICD-10-CM | POA: Diagnosis not present

## 2017-06-26 DIAGNOSIS — D509 Iron deficiency anemia, unspecified: Secondary | ICD-10-CM | POA: Diagnosis not present

## 2017-06-29 DIAGNOSIS — D509 Iron deficiency anemia, unspecified: Secondary | ICD-10-CM | POA: Diagnosis not present

## 2017-06-29 DIAGNOSIS — Z992 Dependence on renal dialysis: Secondary | ICD-10-CM | POA: Diagnosis not present

## 2017-06-29 DIAGNOSIS — N2581 Secondary hyperparathyroidism of renal origin: Secondary | ICD-10-CM | POA: Diagnosis not present

## 2017-06-29 DIAGNOSIS — D631 Anemia in chronic kidney disease: Secondary | ICD-10-CM | POA: Diagnosis not present

## 2017-06-29 DIAGNOSIS — N186 End stage renal disease: Secondary | ICD-10-CM | POA: Diagnosis not present

## 2017-07-01 DIAGNOSIS — N186 End stage renal disease: Secondary | ICD-10-CM | POA: Diagnosis not present

## 2017-07-01 DIAGNOSIS — N2581 Secondary hyperparathyroidism of renal origin: Secondary | ICD-10-CM | POA: Diagnosis not present

## 2017-07-01 DIAGNOSIS — D509 Iron deficiency anemia, unspecified: Secondary | ICD-10-CM | POA: Diagnosis not present

## 2017-07-01 DIAGNOSIS — Z992 Dependence on renal dialysis: Secondary | ICD-10-CM | POA: Diagnosis not present

## 2017-07-01 DIAGNOSIS — D631 Anemia in chronic kidney disease: Secondary | ICD-10-CM | POA: Diagnosis not present

## 2017-07-02 DIAGNOSIS — Z992 Dependence on renal dialysis: Secondary | ICD-10-CM | POA: Diagnosis not present

## 2017-07-02 DIAGNOSIS — D509 Iron deficiency anemia, unspecified: Secondary | ICD-10-CM | POA: Diagnosis not present

## 2017-07-02 DIAGNOSIS — N2581 Secondary hyperparathyroidism of renal origin: Secondary | ICD-10-CM | POA: Diagnosis not present

## 2017-07-02 DIAGNOSIS — N186 End stage renal disease: Secondary | ICD-10-CM | POA: Diagnosis not present

## 2017-07-02 DIAGNOSIS — D631 Anemia in chronic kidney disease: Secondary | ICD-10-CM | POA: Diagnosis not present

## 2017-07-06 DIAGNOSIS — D631 Anemia in chronic kidney disease: Secondary | ICD-10-CM | POA: Diagnosis not present

## 2017-07-06 DIAGNOSIS — N2581 Secondary hyperparathyroidism of renal origin: Secondary | ICD-10-CM | POA: Diagnosis not present

## 2017-07-06 DIAGNOSIS — Z992 Dependence on renal dialysis: Secondary | ICD-10-CM | POA: Diagnosis not present

## 2017-07-06 DIAGNOSIS — N186 End stage renal disease: Secondary | ICD-10-CM | POA: Diagnosis not present

## 2017-07-06 DIAGNOSIS — D509 Iron deficiency anemia, unspecified: Secondary | ICD-10-CM | POA: Diagnosis not present

## 2017-07-08 DIAGNOSIS — N2581 Secondary hyperparathyroidism of renal origin: Secondary | ICD-10-CM | POA: Diagnosis not present

## 2017-07-08 DIAGNOSIS — D631 Anemia in chronic kidney disease: Secondary | ICD-10-CM | POA: Diagnosis not present

## 2017-07-08 DIAGNOSIS — N186 End stage renal disease: Secondary | ICD-10-CM | POA: Diagnosis not present

## 2017-07-08 DIAGNOSIS — D509 Iron deficiency anemia, unspecified: Secondary | ICD-10-CM | POA: Diagnosis not present

## 2017-07-08 DIAGNOSIS — Z992 Dependence on renal dialysis: Secondary | ICD-10-CM | POA: Diagnosis not present

## 2017-07-10 DIAGNOSIS — D631 Anemia in chronic kidney disease: Secondary | ICD-10-CM | POA: Diagnosis not present

## 2017-07-10 DIAGNOSIS — N2581 Secondary hyperparathyroidism of renal origin: Secondary | ICD-10-CM | POA: Diagnosis not present

## 2017-07-10 DIAGNOSIS — Z992 Dependence on renal dialysis: Secondary | ICD-10-CM | POA: Diagnosis not present

## 2017-07-10 DIAGNOSIS — D509 Iron deficiency anemia, unspecified: Secondary | ICD-10-CM | POA: Diagnosis not present

## 2017-07-10 DIAGNOSIS — N186 End stage renal disease: Secondary | ICD-10-CM | POA: Diagnosis not present

## 2017-07-13 DIAGNOSIS — N186 End stage renal disease: Secondary | ICD-10-CM | POA: Diagnosis not present

## 2017-07-13 DIAGNOSIS — N2581 Secondary hyperparathyroidism of renal origin: Secondary | ICD-10-CM | POA: Diagnosis not present

## 2017-07-13 DIAGNOSIS — D631 Anemia in chronic kidney disease: Secondary | ICD-10-CM | POA: Diagnosis not present

## 2017-07-13 DIAGNOSIS — Z992 Dependence on renal dialysis: Secondary | ICD-10-CM | POA: Diagnosis not present

## 2017-07-13 DIAGNOSIS — D509 Iron deficiency anemia, unspecified: Secondary | ICD-10-CM | POA: Diagnosis not present

## 2017-07-14 DIAGNOSIS — Z452 Encounter for adjustment and management of vascular access device: Secondary | ICD-10-CM | POA: Diagnosis not present

## 2017-07-15 DIAGNOSIS — D631 Anemia in chronic kidney disease: Secondary | ICD-10-CM | POA: Diagnosis not present

## 2017-07-15 DIAGNOSIS — N186 End stage renal disease: Secondary | ICD-10-CM | POA: Diagnosis not present

## 2017-07-15 DIAGNOSIS — Z992 Dependence on renal dialysis: Secondary | ICD-10-CM | POA: Diagnosis not present

## 2017-07-15 DIAGNOSIS — D509 Iron deficiency anemia, unspecified: Secondary | ICD-10-CM | POA: Diagnosis not present

## 2017-07-15 DIAGNOSIS — N2581 Secondary hyperparathyroidism of renal origin: Secondary | ICD-10-CM | POA: Diagnosis not present

## 2017-07-17 DIAGNOSIS — N2581 Secondary hyperparathyroidism of renal origin: Secondary | ICD-10-CM | POA: Diagnosis not present

## 2017-07-17 DIAGNOSIS — N186 End stage renal disease: Secondary | ICD-10-CM | POA: Diagnosis not present

## 2017-07-17 DIAGNOSIS — D631 Anemia in chronic kidney disease: Secondary | ICD-10-CM | POA: Diagnosis not present

## 2017-07-17 DIAGNOSIS — Z992 Dependence on renal dialysis: Secondary | ICD-10-CM | POA: Diagnosis not present

## 2017-07-17 DIAGNOSIS — D509 Iron deficiency anemia, unspecified: Secondary | ICD-10-CM | POA: Diagnosis not present

## 2017-07-19 DIAGNOSIS — N186 End stage renal disease: Secondary | ICD-10-CM | POA: Diagnosis not present

## 2017-07-19 DIAGNOSIS — Z992 Dependence on renal dialysis: Secondary | ICD-10-CM | POA: Diagnosis not present

## 2017-07-20 DIAGNOSIS — Z992 Dependence on renal dialysis: Secondary | ICD-10-CM | POA: Diagnosis not present

## 2017-07-20 DIAGNOSIS — D509 Iron deficiency anemia, unspecified: Secondary | ICD-10-CM | POA: Diagnosis not present

## 2017-07-20 DIAGNOSIS — N186 End stage renal disease: Secondary | ICD-10-CM | POA: Diagnosis not present

## 2017-07-20 DIAGNOSIS — Z23 Encounter for immunization: Secondary | ICD-10-CM | POA: Diagnosis not present

## 2017-07-20 DIAGNOSIS — D631 Anemia in chronic kidney disease: Secondary | ICD-10-CM | POA: Diagnosis not present

## 2017-07-22 DIAGNOSIS — D631 Anemia in chronic kidney disease: Secondary | ICD-10-CM | POA: Diagnosis not present

## 2017-07-22 DIAGNOSIS — Z992 Dependence on renal dialysis: Secondary | ICD-10-CM | POA: Diagnosis not present

## 2017-07-22 DIAGNOSIS — D509 Iron deficiency anemia, unspecified: Secondary | ICD-10-CM | POA: Diagnosis not present

## 2017-07-22 DIAGNOSIS — N186 End stage renal disease: Secondary | ICD-10-CM | POA: Diagnosis not present

## 2017-07-22 DIAGNOSIS — Z23 Encounter for immunization: Secondary | ICD-10-CM | POA: Diagnosis not present

## 2017-07-24 DIAGNOSIS — Z23 Encounter for immunization: Secondary | ICD-10-CM | POA: Diagnosis not present

## 2017-07-24 DIAGNOSIS — Z992 Dependence on renal dialysis: Secondary | ICD-10-CM | POA: Diagnosis not present

## 2017-07-24 DIAGNOSIS — D509 Iron deficiency anemia, unspecified: Secondary | ICD-10-CM | POA: Diagnosis not present

## 2017-07-24 DIAGNOSIS — D631 Anemia in chronic kidney disease: Secondary | ICD-10-CM | POA: Diagnosis not present

## 2017-07-24 DIAGNOSIS — N186 End stage renal disease: Secondary | ICD-10-CM | POA: Diagnosis not present

## 2017-07-27 DIAGNOSIS — Z992 Dependence on renal dialysis: Secondary | ICD-10-CM | POA: Diagnosis not present

## 2017-07-27 DIAGNOSIS — D509 Iron deficiency anemia, unspecified: Secondary | ICD-10-CM | POA: Diagnosis not present

## 2017-07-27 DIAGNOSIS — N186 End stage renal disease: Secondary | ICD-10-CM | POA: Diagnosis not present

## 2017-07-27 DIAGNOSIS — D631 Anemia in chronic kidney disease: Secondary | ICD-10-CM | POA: Diagnosis not present

## 2017-07-27 DIAGNOSIS — Z23 Encounter for immunization: Secondary | ICD-10-CM | POA: Diagnosis not present

## 2017-07-29 DIAGNOSIS — Z23 Encounter for immunization: Secondary | ICD-10-CM | POA: Diagnosis not present

## 2017-07-29 DIAGNOSIS — D509 Iron deficiency anemia, unspecified: Secondary | ICD-10-CM | POA: Diagnosis not present

## 2017-07-29 DIAGNOSIS — D631 Anemia in chronic kidney disease: Secondary | ICD-10-CM | POA: Diagnosis not present

## 2017-07-29 DIAGNOSIS — Z992 Dependence on renal dialysis: Secondary | ICD-10-CM | POA: Diagnosis not present

## 2017-07-29 DIAGNOSIS — N186 End stage renal disease: Secondary | ICD-10-CM | POA: Diagnosis not present

## 2017-07-31 DIAGNOSIS — Z992 Dependence on renal dialysis: Secondary | ICD-10-CM | POA: Diagnosis not present

## 2017-07-31 DIAGNOSIS — N186 End stage renal disease: Secondary | ICD-10-CM | POA: Diagnosis not present

## 2017-07-31 DIAGNOSIS — D631 Anemia in chronic kidney disease: Secondary | ICD-10-CM | POA: Diagnosis not present

## 2017-07-31 DIAGNOSIS — Z23 Encounter for immunization: Secondary | ICD-10-CM | POA: Diagnosis not present

## 2017-07-31 DIAGNOSIS — D509 Iron deficiency anemia, unspecified: Secondary | ICD-10-CM | POA: Diagnosis not present

## 2017-08-03 DIAGNOSIS — N186 End stage renal disease: Secondary | ICD-10-CM | POA: Diagnosis not present

## 2017-08-03 DIAGNOSIS — D631 Anemia in chronic kidney disease: Secondary | ICD-10-CM | POA: Diagnosis not present

## 2017-08-03 DIAGNOSIS — D509 Iron deficiency anemia, unspecified: Secondary | ICD-10-CM | POA: Diagnosis not present

## 2017-08-03 DIAGNOSIS — Z992 Dependence on renal dialysis: Secondary | ICD-10-CM | POA: Diagnosis not present

## 2017-08-03 DIAGNOSIS — Z23 Encounter for immunization: Secondary | ICD-10-CM | POA: Diagnosis not present

## 2017-08-04 DIAGNOSIS — J440 Chronic obstructive pulmonary disease with acute lower respiratory infection: Secondary | ICD-10-CM | POA: Diagnosis not present

## 2017-08-04 DIAGNOSIS — I10 Essential (primary) hypertension: Secondary | ICD-10-CM | POA: Diagnosis not present

## 2017-08-04 DIAGNOSIS — I358 Other nonrheumatic aortic valve disorders: Secondary | ICD-10-CM | POA: Diagnosis not present

## 2017-08-04 DIAGNOSIS — M545 Low back pain: Secondary | ICD-10-CM | POA: Diagnosis not present

## 2017-08-04 DIAGNOSIS — Z7901 Long term (current) use of anticoagulants: Secondary | ICD-10-CM | POA: Diagnosis not present

## 2017-08-05 DIAGNOSIS — D631 Anemia in chronic kidney disease: Secondary | ICD-10-CM | POA: Diagnosis not present

## 2017-08-05 DIAGNOSIS — D509 Iron deficiency anemia, unspecified: Secondary | ICD-10-CM | POA: Diagnosis not present

## 2017-08-05 DIAGNOSIS — Z992 Dependence on renal dialysis: Secondary | ICD-10-CM | POA: Diagnosis not present

## 2017-08-05 DIAGNOSIS — N186 End stage renal disease: Secondary | ICD-10-CM | POA: Diagnosis not present

## 2017-08-05 DIAGNOSIS — Z23 Encounter for immunization: Secondary | ICD-10-CM | POA: Diagnosis not present

## 2017-08-07 DIAGNOSIS — D631 Anemia in chronic kidney disease: Secondary | ICD-10-CM | POA: Diagnosis not present

## 2017-08-07 DIAGNOSIS — N186 End stage renal disease: Secondary | ICD-10-CM | POA: Diagnosis not present

## 2017-08-07 DIAGNOSIS — D509 Iron deficiency anemia, unspecified: Secondary | ICD-10-CM | POA: Diagnosis not present

## 2017-08-07 DIAGNOSIS — Z992 Dependence on renal dialysis: Secondary | ICD-10-CM | POA: Diagnosis not present

## 2017-08-07 DIAGNOSIS — Z23 Encounter for immunization: Secondary | ICD-10-CM | POA: Diagnosis not present

## 2017-08-10 DIAGNOSIS — D509 Iron deficiency anemia, unspecified: Secondary | ICD-10-CM | POA: Diagnosis not present

## 2017-08-10 DIAGNOSIS — N186 End stage renal disease: Secondary | ICD-10-CM | POA: Diagnosis not present

## 2017-08-10 DIAGNOSIS — Z23 Encounter for immunization: Secondary | ICD-10-CM | POA: Diagnosis not present

## 2017-08-10 DIAGNOSIS — Z992 Dependence on renal dialysis: Secondary | ICD-10-CM | POA: Diagnosis not present

## 2017-08-10 DIAGNOSIS — D631 Anemia in chronic kidney disease: Secondary | ICD-10-CM | POA: Diagnosis not present

## 2017-08-12 DIAGNOSIS — N186 End stage renal disease: Secondary | ICD-10-CM | POA: Diagnosis not present

## 2017-08-12 DIAGNOSIS — D509 Iron deficiency anemia, unspecified: Secondary | ICD-10-CM | POA: Diagnosis not present

## 2017-08-12 DIAGNOSIS — Z23 Encounter for immunization: Secondary | ICD-10-CM | POA: Diagnosis not present

## 2017-08-12 DIAGNOSIS — D631 Anemia in chronic kidney disease: Secondary | ICD-10-CM | POA: Diagnosis not present

## 2017-08-12 DIAGNOSIS — Z992 Dependence on renal dialysis: Secondary | ICD-10-CM | POA: Diagnosis not present

## 2017-08-13 DIAGNOSIS — Z452 Encounter for adjustment and management of vascular access device: Secondary | ICD-10-CM | POA: Diagnosis not present

## 2017-08-14 DIAGNOSIS — N186 End stage renal disease: Secondary | ICD-10-CM | POA: Diagnosis not present

## 2017-08-14 DIAGNOSIS — D509 Iron deficiency anemia, unspecified: Secondary | ICD-10-CM | POA: Diagnosis not present

## 2017-08-14 DIAGNOSIS — Z23 Encounter for immunization: Secondary | ICD-10-CM | POA: Diagnosis not present

## 2017-08-14 DIAGNOSIS — Z992 Dependence on renal dialysis: Secondary | ICD-10-CM | POA: Diagnosis not present

## 2017-08-14 DIAGNOSIS — D631 Anemia in chronic kidney disease: Secondary | ICD-10-CM | POA: Diagnosis not present

## 2017-08-17 DIAGNOSIS — Z992 Dependence on renal dialysis: Secondary | ICD-10-CM | POA: Diagnosis not present

## 2017-08-17 DIAGNOSIS — N186 End stage renal disease: Secondary | ICD-10-CM | POA: Diagnosis not present

## 2017-08-17 DIAGNOSIS — D509 Iron deficiency anemia, unspecified: Secondary | ICD-10-CM | POA: Diagnosis not present

## 2017-08-17 DIAGNOSIS — D631 Anemia in chronic kidney disease: Secondary | ICD-10-CM | POA: Diagnosis not present

## 2017-08-17 DIAGNOSIS — Z23 Encounter for immunization: Secondary | ICD-10-CM | POA: Diagnosis not present

## 2017-08-18 DIAGNOSIS — Z992 Dependence on renal dialysis: Secondary | ICD-10-CM | POA: Diagnosis not present

## 2017-08-18 DIAGNOSIS — N186 End stage renal disease: Secondary | ICD-10-CM | POA: Diagnosis not present

## 2017-08-19 DIAGNOSIS — D631 Anemia in chronic kidney disease: Secondary | ICD-10-CM | POA: Diagnosis not present

## 2017-08-19 DIAGNOSIS — D509 Iron deficiency anemia, unspecified: Secondary | ICD-10-CM | POA: Diagnosis not present

## 2017-08-19 DIAGNOSIS — N186 End stage renal disease: Secondary | ICD-10-CM | POA: Diagnosis not present

## 2017-08-19 DIAGNOSIS — Z992 Dependence on renal dialysis: Secondary | ICD-10-CM | POA: Diagnosis not present

## 2017-08-19 DIAGNOSIS — Z23 Encounter for immunization: Secondary | ICD-10-CM | POA: Diagnosis not present

## 2017-08-21 DIAGNOSIS — D631 Anemia in chronic kidney disease: Secondary | ICD-10-CM | POA: Diagnosis not present

## 2017-08-21 DIAGNOSIS — Z992 Dependence on renal dialysis: Secondary | ICD-10-CM | POA: Diagnosis not present

## 2017-08-21 DIAGNOSIS — T82848A Pain from vascular prosthetic devices, implants and grafts, initial encounter: Secondary | ICD-10-CM | POA: Diagnosis not present

## 2017-08-21 DIAGNOSIS — T82898A Other specified complication of vascular prosthetic devices, implants and grafts, initial encounter: Secondary | ICD-10-CM | POA: Diagnosis not present

## 2017-08-21 DIAGNOSIS — D509 Iron deficiency anemia, unspecified: Secondary | ICD-10-CM | POA: Diagnosis not present

## 2017-08-21 DIAGNOSIS — I959 Hypotension, unspecified: Secondary | ICD-10-CM | POA: Diagnosis not present

## 2017-08-21 DIAGNOSIS — N186 End stage renal disease: Secondary | ICD-10-CM | POA: Diagnosis not present

## 2017-08-21 DIAGNOSIS — R6883 Chills (without fever): Secondary | ICD-10-CM | POA: Diagnosis not present

## 2017-08-24 DIAGNOSIS — D509 Iron deficiency anemia, unspecified: Secondary | ICD-10-CM | POA: Diagnosis not present

## 2017-08-24 DIAGNOSIS — D631 Anemia in chronic kidney disease: Secondary | ICD-10-CM | POA: Diagnosis not present

## 2017-08-24 DIAGNOSIS — M79601 Pain in right arm: Secondary | ICD-10-CM | POA: Diagnosis not present

## 2017-08-24 DIAGNOSIS — T82898A Other specified complication of vascular prosthetic devices, implants and grafts, initial encounter: Secondary | ICD-10-CM | POA: Diagnosis not present

## 2017-08-24 DIAGNOSIS — R079 Chest pain, unspecified: Secondary | ICD-10-CM | POA: Diagnosis not present

## 2017-08-24 DIAGNOSIS — Z7982 Long term (current) use of aspirin: Secondary | ICD-10-CM | POA: Diagnosis not present

## 2017-08-24 DIAGNOSIS — I10 Essential (primary) hypertension: Secondary | ICD-10-CM | POA: Diagnosis not present

## 2017-08-24 DIAGNOSIS — Z7901 Long term (current) use of anticoagulants: Secondary | ICD-10-CM | POA: Diagnosis not present

## 2017-08-24 DIAGNOSIS — J40 Bronchitis, not specified as acute or chronic: Secondary | ICD-10-CM | POA: Diagnosis not present

## 2017-08-24 DIAGNOSIS — Z992 Dependence on renal dialysis: Secondary | ICD-10-CM | POA: Diagnosis not present

## 2017-08-24 DIAGNOSIS — F419 Anxiety disorder, unspecified: Secondary | ICD-10-CM | POA: Diagnosis not present

## 2017-08-24 DIAGNOSIS — J209 Acute bronchitis, unspecified: Secondary | ICD-10-CM | POA: Diagnosis not present

## 2017-08-24 DIAGNOSIS — F329 Major depressive disorder, single episode, unspecified: Secondary | ICD-10-CM | POA: Diagnosis not present

## 2017-08-24 DIAGNOSIS — N186 End stage renal disease: Secondary | ICD-10-CM | POA: Diagnosis not present

## 2017-08-24 DIAGNOSIS — Z720 Tobacco use: Secondary | ICD-10-CM | POA: Diagnosis not present

## 2017-08-24 DIAGNOSIS — Z7951 Long term (current) use of inhaled steroids: Secondary | ICD-10-CM | POA: Diagnosis not present

## 2017-08-24 DIAGNOSIS — M25511 Pain in right shoulder: Secondary | ICD-10-CM | POA: Diagnosis not present

## 2017-08-24 DIAGNOSIS — Z87891 Personal history of nicotine dependence: Secondary | ICD-10-CM | POA: Diagnosis not present

## 2017-08-24 DIAGNOSIS — T82848A Pain from vascular prosthetic devices, implants and grafts, initial encounter: Secondary | ICD-10-CM | POA: Diagnosis not present

## 2017-08-24 DIAGNOSIS — Z79899 Other long term (current) drug therapy: Secondary | ICD-10-CM | POA: Diagnosis not present

## 2017-08-26 DIAGNOSIS — T82848A Pain from vascular prosthetic devices, implants and grafts, initial encounter: Secondary | ICD-10-CM | POA: Diagnosis not present

## 2017-08-26 DIAGNOSIS — Z992 Dependence on renal dialysis: Secondary | ICD-10-CM | POA: Diagnosis not present

## 2017-08-26 DIAGNOSIS — D631 Anemia in chronic kidney disease: Secondary | ICD-10-CM | POA: Diagnosis not present

## 2017-08-26 DIAGNOSIS — D509 Iron deficiency anemia, unspecified: Secondary | ICD-10-CM | POA: Diagnosis not present

## 2017-08-26 DIAGNOSIS — N186 End stage renal disease: Secondary | ICD-10-CM | POA: Diagnosis not present

## 2017-08-26 DIAGNOSIS — T82898A Other specified complication of vascular prosthetic devices, implants and grafts, initial encounter: Secondary | ICD-10-CM | POA: Diagnosis not present

## 2017-08-28 DIAGNOSIS — T82848A Pain from vascular prosthetic devices, implants and grafts, initial encounter: Secondary | ICD-10-CM | POA: Diagnosis not present

## 2017-08-28 DIAGNOSIS — N186 End stage renal disease: Secondary | ICD-10-CM | POA: Diagnosis not present

## 2017-08-28 DIAGNOSIS — D631 Anemia in chronic kidney disease: Secondary | ICD-10-CM | POA: Diagnosis not present

## 2017-08-28 DIAGNOSIS — T82898A Other specified complication of vascular prosthetic devices, implants and grafts, initial encounter: Secondary | ICD-10-CM | POA: Diagnosis not present

## 2017-08-28 DIAGNOSIS — Z992 Dependence on renal dialysis: Secondary | ICD-10-CM | POA: Diagnosis not present

## 2017-08-28 DIAGNOSIS — D509 Iron deficiency anemia, unspecified: Secondary | ICD-10-CM | POA: Diagnosis not present

## 2017-08-31 DIAGNOSIS — T82848A Pain from vascular prosthetic devices, implants and grafts, initial encounter: Secondary | ICD-10-CM | POA: Diagnosis not present

## 2017-08-31 DIAGNOSIS — Z992 Dependence on renal dialysis: Secondary | ICD-10-CM | POA: Diagnosis not present

## 2017-08-31 DIAGNOSIS — D509 Iron deficiency anemia, unspecified: Secondary | ICD-10-CM | POA: Diagnosis not present

## 2017-08-31 DIAGNOSIS — D631 Anemia in chronic kidney disease: Secondary | ICD-10-CM | POA: Diagnosis not present

## 2017-08-31 DIAGNOSIS — N186 End stage renal disease: Secondary | ICD-10-CM | POA: Diagnosis not present

## 2017-08-31 DIAGNOSIS — T82898A Other specified complication of vascular prosthetic devices, implants and grafts, initial encounter: Secondary | ICD-10-CM | POA: Diagnosis not present

## 2017-09-02 DIAGNOSIS — N186 End stage renal disease: Secondary | ICD-10-CM | POA: Diagnosis not present

## 2017-09-02 DIAGNOSIS — Z992 Dependence on renal dialysis: Secondary | ICD-10-CM | POA: Diagnosis not present

## 2017-09-02 DIAGNOSIS — T82848A Pain from vascular prosthetic devices, implants and grafts, initial encounter: Secondary | ICD-10-CM | POA: Diagnosis not present

## 2017-09-02 DIAGNOSIS — D631 Anemia in chronic kidney disease: Secondary | ICD-10-CM | POA: Diagnosis not present

## 2017-09-02 DIAGNOSIS — T82898A Other specified complication of vascular prosthetic devices, implants and grafts, initial encounter: Secondary | ICD-10-CM | POA: Diagnosis not present

## 2017-09-02 DIAGNOSIS — D509 Iron deficiency anemia, unspecified: Secondary | ICD-10-CM | POA: Diagnosis not present

## 2017-09-04 DIAGNOSIS — T82898A Other specified complication of vascular prosthetic devices, implants and grafts, initial encounter: Secondary | ICD-10-CM | POA: Diagnosis not present

## 2017-09-04 DIAGNOSIS — D631 Anemia in chronic kidney disease: Secondary | ICD-10-CM | POA: Diagnosis not present

## 2017-09-04 DIAGNOSIS — D509 Iron deficiency anemia, unspecified: Secondary | ICD-10-CM | POA: Diagnosis not present

## 2017-09-04 DIAGNOSIS — T82848A Pain from vascular prosthetic devices, implants and grafts, initial encounter: Secondary | ICD-10-CM | POA: Diagnosis not present

## 2017-09-04 DIAGNOSIS — N186 End stage renal disease: Secondary | ICD-10-CM | POA: Diagnosis not present

## 2017-09-04 DIAGNOSIS — Z992 Dependence on renal dialysis: Secondary | ICD-10-CM | POA: Diagnosis not present

## 2017-09-07 DIAGNOSIS — T82848A Pain from vascular prosthetic devices, implants and grafts, initial encounter: Secondary | ICD-10-CM | POA: Diagnosis not present

## 2017-09-07 DIAGNOSIS — Z992 Dependence on renal dialysis: Secondary | ICD-10-CM | POA: Diagnosis not present

## 2017-09-07 DIAGNOSIS — D631 Anemia in chronic kidney disease: Secondary | ICD-10-CM | POA: Diagnosis not present

## 2017-09-07 DIAGNOSIS — N186 End stage renal disease: Secondary | ICD-10-CM | POA: Diagnosis not present

## 2017-09-07 DIAGNOSIS — T82898A Other specified complication of vascular prosthetic devices, implants and grafts, initial encounter: Secondary | ICD-10-CM | POA: Diagnosis not present

## 2017-09-07 DIAGNOSIS — D509 Iron deficiency anemia, unspecified: Secondary | ICD-10-CM | POA: Diagnosis not present

## 2017-09-09 DIAGNOSIS — T82848A Pain from vascular prosthetic devices, implants and grafts, initial encounter: Secondary | ICD-10-CM | POA: Diagnosis not present

## 2017-09-09 DIAGNOSIS — D631 Anemia in chronic kidney disease: Secondary | ICD-10-CM | POA: Diagnosis not present

## 2017-09-09 DIAGNOSIS — D509 Iron deficiency anemia, unspecified: Secondary | ICD-10-CM | POA: Diagnosis not present

## 2017-09-09 DIAGNOSIS — Z992 Dependence on renal dialysis: Secondary | ICD-10-CM | POA: Diagnosis not present

## 2017-09-09 DIAGNOSIS — N186 End stage renal disease: Secondary | ICD-10-CM | POA: Diagnosis not present

## 2017-09-09 DIAGNOSIS — R509 Fever, unspecified: Secondary | ICD-10-CM | POA: Diagnosis not present

## 2017-09-09 DIAGNOSIS — T82898A Other specified complication of vascular prosthetic devices, implants and grafts, initial encounter: Secondary | ICD-10-CM | POA: Diagnosis not present

## 2017-09-11 DIAGNOSIS — D509 Iron deficiency anemia, unspecified: Secondary | ICD-10-CM | POA: Diagnosis not present

## 2017-09-11 DIAGNOSIS — T82898A Other specified complication of vascular prosthetic devices, implants and grafts, initial encounter: Secondary | ICD-10-CM | POA: Diagnosis not present

## 2017-09-11 DIAGNOSIS — D631 Anemia in chronic kidney disease: Secondary | ICD-10-CM | POA: Diagnosis not present

## 2017-09-11 DIAGNOSIS — Z992 Dependence on renal dialysis: Secondary | ICD-10-CM | POA: Diagnosis not present

## 2017-09-11 DIAGNOSIS — T82848A Pain from vascular prosthetic devices, implants and grafts, initial encounter: Secondary | ICD-10-CM | POA: Diagnosis not present

## 2017-09-11 DIAGNOSIS — N186 End stage renal disease: Secondary | ICD-10-CM | POA: Diagnosis not present

## 2017-09-14 DIAGNOSIS — D509 Iron deficiency anemia, unspecified: Secondary | ICD-10-CM | POA: Diagnosis not present

## 2017-09-14 DIAGNOSIS — T82848A Pain from vascular prosthetic devices, implants and grafts, initial encounter: Secondary | ICD-10-CM | POA: Diagnosis not present

## 2017-09-14 DIAGNOSIS — T82898A Other specified complication of vascular prosthetic devices, implants and grafts, initial encounter: Secondary | ICD-10-CM | POA: Diagnosis not present

## 2017-09-14 DIAGNOSIS — D631 Anemia in chronic kidney disease: Secondary | ICD-10-CM | POA: Diagnosis not present

## 2017-09-14 DIAGNOSIS — Z992 Dependence on renal dialysis: Secondary | ICD-10-CM | POA: Diagnosis not present

## 2017-09-14 DIAGNOSIS — N186 End stage renal disease: Secondary | ICD-10-CM | POA: Diagnosis not present

## 2017-09-16 DIAGNOSIS — N186 End stage renal disease: Secondary | ICD-10-CM | POA: Diagnosis not present

## 2017-09-16 DIAGNOSIS — D631 Anemia in chronic kidney disease: Secondary | ICD-10-CM | POA: Diagnosis not present

## 2017-09-16 DIAGNOSIS — T82848A Pain from vascular prosthetic devices, implants and grafts, initial encounter: Secondary | ICD-10-CM | POA: Diagnosis not present

## 2017-09-16 DIAGNOSIS — Z992 Dependence on renal dialysis: Secondary | ICD-10-CM | POA: Diagnosis not present

## 2017-09-16 DIAGNOSIS — T82898A Other specified complication of vascular prosthetic devices, implants and grafts, initial encounter: Secondary | ICD-10-CM | POA: Diagnosis not present

## 2017-09-16 DIAGNOSIS — D509 Iron deficiency anemia, unspecified: Secondary | ICD-10-CM | POA: Diagnosis not present

## 2017-09-17 DIAGNOSIS — Z452 Encounter for adjustment and management of vascular access device: Secondary | ICD-10-CM | POA: Diagnosis not present

## 2017-09-18 DIAGNOSIS — N186 End stage renal disease: Secondary | ICD-10-CM | POA: Diagnosis not present

## 2017-09-18 DIAGNOSIS — T82898A Other specified complication of vascular prosthetic devices, implants and grafts, initial encounter: Secondary | ICD-10-CM | POA: Diagnosis not present

## 2017-09-18 DIAGNOSIS — D509 Iron deficiency anemia, unspecified: Secondary | ICD-10-CM | POA: Diagnosis not present

## 2017-09-18 DIAGNOSIS — Z992 Dependence on renal dialysis: Secondary | ICD-10-CM | POA: Diagnosis not present

## 2017-09-18 DIAGNOSIS — D631 Anemia in chronic kidney disease: Secondary | ICD-10-CM | POA: Diagnosis not present

## 2017-09-18 DIAGNOSIS — T82848A Pain from vascular prosthetic devices, implants and grafts, initial encounter: Secondary | ICD-10-CM | POA: Diagnosis not present

## 2017-09-21 DIAGNOSIS — D631 Anemia in chronic kidney disease: Secondary | ICD-10-CM | POA: Diagnosis not present

## 2017-09-21 DIAGNOSIS — N186 End stage renal disease: Secondary | ICD-10-CM | POA: Diagnosis not present

## 2017-09-21 DIAGNOSIS — D509 Iron deficiency anemia, unspecified: Secondary | ICD-10-CM | POA: Diagnosis not present

## 2017-09-21 DIAGNOSIS — Z992 Dependence on renal dialysis: Secondary | ICD-10-CM | POA: Diagnosis not present

## 2017-09-21 DIAGNOSIS — N2581 Secondary hyperparathyroidism of renal origin: Secondary | ICD-10-CM | POA: Diagnosis not present

## 2017-09-23 DIAGNOSIS — D631 Anemia in chronic kidney disease: Secondary | ICD-10-CM | POA: Diagnosis not present

## 2017-09-23 DIAGNOSIS — D509 Iron deficiency anemia, unspecified: Secondary | ICD-10-CM | POA: Diagnosis not present

## 2017-09-23 DIAGNOSIS — N2581 Secondary hyperparathyroidism of renal origin: Secondary | ICD-10-CM | POA: Diagnosis not present

## 2017-09-23 DIAGNOSIS — N186 End stage renal disease: Secondary | ICD-10-CM | POA: Diagnosis not present

## 2017-09-23 DIAGNOSIS — Z992 Dependence on renal dialysis: Secondary | ICD-10-CM | POA: Diagnosis not present

## 2017-09-25 DIAGNOSIS — Z992 Dependence on renal dialysis: Secondary | ICD-10-CM | POA: Diagnosis not present

## 2017-09-25 DIAGNOSIS — D509 Iron deficiency anemia, unspecified: Secondary | ICD-10-CM | POA: Diagnosis not present

## 2017-09-25 DIAGNOSIS — N186 End stage renal disease: Secondary | ICD-10-CM | POA: Diagnosis not present

## 2017-09-25 DIAGNOSIS — D631 Anemia in chronic kidney disease: Secondary | ICD-10-CM | POA: Diagnosis not present

## 2017-09-25 DIAGNOSIS — N2581 Secondary hyperparathyroidism of renal origin: Secondary | ICD-10-CM | POA: Diagnosis not present

## 2017-09-26 DIAGNOSIS — Z992 Dependence on renal dialysis: Secondary | ICD-10-CM | POA: Diagnosis not present

## 2017-09-26 DIAGNOSIS — N186 End stage renal disease: Secondary | ICD-10-CM | POA: Diagnosis not present

## 2017-09-26 DIAGNOSIS — D631 Anemia in chronic kidney disease: Secondary | ICD-10-CM | POA: Diagnosis not present

## 2017-09-26 DIAGNOSIS — N2581 Secondary hyperparathyroidism of renal origin: Secondary | ICD-10-CM | POA: Diagnosis not present

## 2017-09-26 DIAGNOSIS — D509 Iron deficiency anemia, unspecified: Secondary | ICD-10-CM | POA: Diagnosis not present

## 2017-09-28 DIAGNOSIS — N2581 Secondary hyperparathyroidism of renal origin: Secondary | ICD-10-CM | POA: Diagnosis not present

## 2017-09-28 DIAGNOSIS — D631 Anemia in chronic kidney disease: Secondary | ICD-10-CM | POA: Diagnosis not present

## 2017-09-28 DIAGNOSIS — Z992 Dependence on renal dialysis: Secondary | ICD-10-CM | POA: Diagnosis not present

## 2017-09-28 DIAGNOSIS — N186 End stage renal disease: Secondary | ICD-10-CM | POA: Diagnosis not present

## 2017-09-28 DIAGNOSIS — D509 Iron deficiency anemia, unspecified: Secondary | ICD-10-CM | POA: Diagnosis not present

## 2017-09-30 DIAGNOSIS — N2581 Secondary hyperparathyroidism of renal origin: Secondary | ICD-10-CM | POA: Diagnosis not present

## 2017-09-30 DIAGNOSIS — D631 Anemia in chronic kidney disease: Secondary | ICD-10-CM | POA: Diagnosis not present

## 2017-09-30 DIAGNOSIS — D509 Iron deficiency anemia, unspecified: Secondary | ICD-10-CM | POA: Diagnosis not present

## 2017-09-30 DIAGNOSIS — N186 End stage renal disease: Secondary | ICD-10-CM | POA: Diagnosis not present

## 2017-09-30 DIAGNOSIS — Z992 Dependence on renal dialysis: Secondary | ICD-10-CM | POA: Diagnosis not present

## 2017-10-02 DIAGNOSIS — Z992 Dependence on renal dialysis: Secondary | ICD-10-CM | POA: Diagnosis not present

## 2017-10-02 DIAGNOSIS — D631 Anemia in chronic kidney disease: Secondary | ICD-10-CM | POA: Diagnosis not present

## 2017-10-02 DIAGNOSIS — N2581 Secondary hyperparathyroidism of renal origin: Secondary | ICD-10-CM | POA: Diagnosis not present

## 2017-10-02 DIAGNOSIS — D509 Iron deficiency anemia, unspecified: Secondary | ICD-10-CM | POA: Diagnosis not present

## 2017-10-02 DIAGNOSIS — N186 End stage renal disease: Secondary | ICD-10-CM | POA: Diagnosis not present

## 2017-10-05 DIAGNOSIS — D509 Iron deficiency anemia, unspecified: Secondary | ICD-10-CM | POA: Diagnosis not present

## 2017-10-05 DIAGNOSIS — N186 End stage renal disease: Secondary | ICD-10-CM | POA: Diagnosis not present

## 2017-10-05 DIAGNOSIS — D631 Anemia in chronic kidney disease: Secondary | ICD-10-CM | POA: Diagnosis not present

## 2017-10-05 DIAGNOSIS — Z992 Dependence on renal dialysis: Secondary | ICD-10-CM | POA: Diagnosis not present

## 2017-10-05 DIAGNOSIS — N2581 Secondary hyperparathyroidism of renal origin: Secondary | ICD-10-CM | POA: Diagnosis not present

## 2017-10-07 DIAGNOSIS — D509 Iron deficiency anemia, unspecified: Secondary | ICD-10-CM | POA: Diagnosis not present

## 2017-10-07 DIAGNOSIS — N186 End stage renal disease: Secondary | ICD-10-CM | POA: Diagnosis not present

## 2017-10-07 DIAGNOSIS — Z992 Dependence on renal dialysis: Secondary | ICD-10-CM | POA: Diagnosis not present

## 2017-10-07 DIAGNOSIS — D631 Anemia in chronic kidney disease: Secondary | ICD-10-CM | POA: Diagnosis not present

## 2017-10-07 DIAGNOSIS — N2581 Secondary hyperparathyroidism of renal origin: Secondary | ICD-10-CM | POA: Diagnosis not present

## 2017-10-09 DIAGNOSIS — D631 Anemia in chronic kidney disease: Secondary | ICD-10-CM | POA: Diagnosis not present

## 2017-10-09 DIAGNOSIS — I10 Essential (primary) hypertension: Secondary | ICD-10-CM | POA: Diagnosis not present

## 2017-10-09 DIAGNOSIS — M545 Low back pain: Secondary | ICD-10-CM | POA: Diagnosis not present

## 2017-10-09 DIAGNOSIS — N2581 Secondary hyperparathyroidism of renal origin: Secondary | ICD-10-CM | POA: Diagnosis not present

## 2017-10-09 DIAGNOSIS — I358 Other nonrheumatic aortic valve disorders: Secondary | ICD-10-CM | POA: Diagnosis not present

## 2017-10-09 DIAGNOSIS — Z992 Dependence on renal dialysis: Secondary | ICD-10-CM | POA: Diagnosis not present

## 2017-10-09 DIAGNOSIS — N186 End stage renal disease: Secondary | ICD-10-CM | POA: Diagnosis not present

## 2017-10-09 DIAGNOSIS — D509 Iron deficiency anemia, unspecified: Secondary | ICD-10-CM | POA: Diagnosis not present

## 2017-10-09 DIAGNOSIS — J441 Chronic obstructive pulmonary disease with (acute) exacerbation: Secondary | ICD-10-CM | POA: Diagnosis not present

## 2017-10-11 DIAGNOSIS — Z992 Dependence on renal dialysis: Secondary | ICD-10-CM | POA: Diagnosis not present

## 2017-10-11 DIAGNOSIS — D631 Anemia in chronic kidney disease: Secondary | ICD-10-CM | POA: Diagnosis not present

## 2017-10-11 DIAGNOSIS — D509 Iron deficiency anemia, unspecified: Secondary | ICD-10-CM | POA: Diagnosis not present

## 2017-10-11 DIAGNOSIS — N2581 Secondary hyperparathyroidism of renal origin: Secondary | ICD-10-CM | POA: Diagnosis not present

## 2017-10-11 DIAGNOSIS — N186 End stage renal disease: Secondary | ICD-10-CM | POA: Diagnosis not present

## 2017-10-14 DIAGNOSIS — D509 Iron deficiency anemia, unspecified: Secondary | ICD-10-CM | POA: Diagnosis not present

## 2017-10-14 DIAGNOSIS — Z992 Dependence on renal dialysis: Secondary | ICD-10-CM | POA: Diagnosis not present

## 2017-10-14 DIAGNOSIS — N186 End stage renal disease: Secondary | ICD-10-CM | POA: Diagnosis not present

## 2017-10-14 DIAGNOSIS — D631 Anemia in chronic kidney disease: Secondary | ICD-10-CM | POA: Diagnosis not present

## 2017-10-14 DIAGNOSIS — N2581 Secondary hyperparathyroidism of renal origin: Secondary | ICD-10-CM | POA: Diagnosis not present

## 2017-10-16 DIAGNOSIS — N2581 Secondary hyperparathyroidism of renal origin: Secondary | ICD-10-CM | POA: Diagnosis not present

## 2017-10-16 DIAGNOSIS — D631 Anemia in chronic kidney disease: Secondary | ICD-10-CM | POA: Diagnosis not present

## 2017-10-16 DIAGNOSIS — Z992 Dependence on renal dialysis: Secondary | ICD-10-CM | POA: Diagnosis not present

## 2017-10-16 DIAGNOSIS — D509 Iron deficiency anemia, unspecified: Secondary | ICD-10-CM | POA: Diagnosis not present

## 2017-10-16 DIAGNOSIS — N186 End stage renal disease: Secondary | ICD-10-CM | POA: Diagnosis not present

## 2017-10-18 DIAGNOSIS — N2581 Secondary hyperparathyroidism of renal origin: Secondary | ICD-10-CM | POA: Diagnosis not present

## 2017-10-18 DIAGNOSIS — N186 End stage renal disease: Secondary | ICD-10-CM | POA: Diagnosis not present

## 2017-10-18 DIAGNOSIS — D631 Anemia in chronic kidney disease: Secondary | ICD-10-CM | POA: Diagnosis not present

## 2017-10-18 DIAGNOSIS — Z992 Dependence on renal dialysis: Secondary | ICD-10-CM | POA: Diagnosis not present

## 2017-10-18 DIAGNOSIS — D509 Iron deficiency anemia, unspecified: Secondary | ICD-10-CM | POA: Diagnosis not present

## 2017-10-19 DIAGNOSIS — N186 End stage renal disease: Secondary | ICD-10-CM | POA: Diagnosis not present

## 2017-10-19 DIAGNOSIS — Z992 Dependence on renal dialysis: Secondary | ICD-10-CM | POA: Diagnosis not present

## 2017-10-21 DIAGNOSIS — N186 End stage renal disease: Secondary | ICD-10-CM | POA: Diagnosis not present

## 2017-10-21 DIAGNOSIS — Z992 Dependence on renal dialysis: Secondary | ICD-10-CM | POA: Diagnosis not present

## 2017-10-21 DIAGNOSIS — D631 Anemia in chronic kidney disease: Secondary | ICD-10-CM | POA: Diagnosis not present

## 2017-10-21 DIAGNOSIS — D509 Iron deficiency anemia, unspecified: Secondary | ICD-10-CM | POA: Diagnosis not present

## 2017-10-22 DIAGNOSIS — Z452 Encounter for adjustment and management of vascular access device: Secondary | ICD-10-CM | POA: Diagnosis not present

## 2017-10-23 DIAGNOSIS — D631 Anemia in chronic kidney disease: Secondary | ICD-10-CM | POA: Diagnosis not present

## 2017-10-23 DIAGNOSIS — D509 Iron deficiency anemia, unspecified: Secondary | ICD-10-CM | POA: Diagnosis not present

## 2017-10-23 DIAGNOSIS — N186 End stage renal disease: Secondary | ICD-10-CM | POA: Diagnosis not present

## 2017-10-23 DIAGNOSIS — Z992 Dependence on renal dialysis: Secondary | ICD-10-CM | POA: Diagnosis not present

## 2017-10-26 DIAGNOSIS — Z992 Dependence on renal dialysis: Secondary | ICD-10-CM | POA: Diagnosis not present

## 2017-10-26 DIAGNOSIS — N186 End stage renal disease: Secondary | ICD-10-CM | POA: Diagnosis not present

## 2017-10-26 DIAGNOSIS — D631 Anemia in chronic kidney disease: Secondary | ICD-10-CM | POA: Diagnosis not present

## 2017-10-26 DIAGNOSIS — D509 Iron deficiency anemia, unspecified: Secondary | ICD-10-CM | POA: Diagnosis not present

## 2017-10-28 DIAGNOSIS — D509 Iron deficiency anemia, unspecified: Secondary | ICD-10-CM | POA: Diagnosis not present

## 2017-10-28 DIAGNOSIS — N186 End stage renal disease: Secondary | ICD-10-CM | POA: Diagnosis not present

## 2017-10-28 DIAGNOSIS — D631 Anemia in chronic kidney disease: Secondary | ICD-10-CM | POA: Diagnosis not present

## 2017-10-28 DIAGNOSIS — Z992 Dependence on renal dialysis: Secondary | ICD-10-CM | POA: Diagnosis not present

## 2017-10-30 DIAGNOSIS — N186 End stage renal disease: Secondary | ICD-10-CM | POA: Diagnosis not present

## 2017-10-30 DIAGNOSIS — D631 Anemia in chronic kidney disease: Secondary | ICD-10-CM | POA: Diagnosis not present

## 2017-10-30 DIAGNOSIS — Z992 Dependence on renal dialysis: Secondary | ICD-10-CM | POA: Diagnosis not present

## 2017-10-30 DIAGNOSIS — D509 Iron deficiency anemia, unspecified: Secondary | ICD-10-CM | POA: Diagnosis not present

## 2017-11-02 DIAGNOSIS — N186 End stage renal disease: Secondary | ICD-10-CM | POA: Diagnosis not present

## 2017-11-02 DIAGNOSIS — D509 Iron deficiency anemia, unspecified: Secondary | ICD-10-CM | POA: Diagnosis not present

## 2017-11-02 DIAGNOSIS — Z992 Dependence on renal dialysis: Secondary | ICD-10-CM | POA: Diagnosis not present

## 2017-11-02 DIAGNOSIS — D631 Anemia in chronic kidney disease: Secondary | ICD-10-CM | POA: Diagnosis not present

## 2017-11-04 DIAGNOSIS — Z992 Dependence on renal dialysis: Secondary | ICD-10-CM | POA: Diagnosis not present

## 2017-11-04 DIAGNOSIS — N186 End stage renal disease: Secondary | ICD-10-CM | POA: Diagnosis not present

## 2017-11-04 DIAGNOSIS — D509 Iron deficiency anemia, unspecified: Secondary | ICD-10-CM | POA: Diagnosis not present

## 2017-11-04 DIAGNOSIS — D631 Anemia in chronic kidney disease: Secondary | ICD-10-CM | POA: Diagnosis not present

## 2017-11-06 DIAGNOSIS — N186 End stage renal disease: Secondary | ICD-10-CM | POA: Diagnosis not present

## 2017-11-06 DIAGNOSIS — D509 Iron deficiency anemia, unspecified: Secondary | ICD-10-CM | POA: Diagnosis not present

## 2017-11-06 DIAGNOSIS — Z992 Dependence on renal dialysis: Secondary | ICD-10-CM | POA: Diagnosis not present

## 2017-11-06 DIAGNOSIS — D631 Anemia in chronic kidney disease: Secondary | ICD-10-CM | POA: Diagnosis not present

## 2017-11-09 DIAGNOSIS — D509 Iron deficiency anemia, unspecified: Secondary | ICD-10-CM | POA: Diagnosis not present

## 2017-11-09 DIAGNOSIS — D631 Anemia in chronic kidney disease: Secondary | ICD-10-CM | POA: Diagnosis not present

## 2017-11-09 DIAGNOSIS — Z992 Dependence on renal dialysis: Secondary | ICD-10-CM | POA: Diagnosis not present

## 2017-11-09 DIAGNOSIS — N186 End stage renal disease: Secondary | ICD-10-CM | POA: Diagnosis not present

## 2017-11-11 DIAGNOSIS — Z992 Dependence on renal dialysis: Secondary | ICD-10-CM | POA: Diagnosis not present

## 2017-11-11 DIAGNOSIS — N186 End stage renal disease: Secondary | ICD-10-CM | POA: Diagnosis not present

## 2017-11-11 DIAGNOSIS — D509 Iron deficiency anemia, unspecified: Secondary | ICD-10-CM | POA: Diagnosis not present

## 2017-11-11 DIAGNOSIS — D631 Anemia in chronic kidney disease: Secondary | ICD-10-CM | POA: Diagnosis not present

## 2017-11-13 DIAGNOSIS — D509 Iron deficiency anemia, unspecified: Secondary | ICD-10-CM | POA: Diagnosis not present

## 2017-11-13 DIAGNOSIS — N186 End stage renal disease: Secondary | ICD-10-CM | POA: Diagnosis not present

## 2017-11-13 DIAGNOSIS — Z992 Dependence on renal dialysis: Secondary | ICD-10-CM | POA: Diagnosis not present

## 2017-11-13 DIAGNOSIS — D631 Anemia in chronic kidney disease: Secondary | ICD-10-CM | POA: Diagnosis not present

## 2017-11-16 DIAGNOSIS — N186 End stage renal disease: Secondary | ICD-10-CM | POA: Diagnosis not present

## 2017-11-16 DIAGNOSIS — D509 Iron deficiency anemia, unspecified: Secondary | ICD-10-CM | POA: Diagnosis not present

## 2017-11-16 DIAGNOSIS — D631 Anemia in chronic kidney disease: Secondary | ICD-10-CM | POA: Diagnosis not present

## 2017-11-16 DIAGNOSIS — Z992 Dependence on renal dialysis: Secondary | ICD-10-CM | POA: Diagnosis not present

## 2017-11-18 DIAGNOSIS — D631 Anemia in chronic kidney disease: Secondary | ICD-10-CM | POA: Diagnosis not present

## 2017-11-18 DIAGNOSIS — N186 End stage renal disease: Secondary | ICD-10-CM | POA: Diagnosis not present

## 2017-11-18 DIAGNOSIS — D509 Iron deficiency anemia, unspecified: Secondary | ICD-10-CM | POA: Diagnosis not present

## 2017-11-18 DIAGNOSIS — Z992 Dependence on renal dialysis: Secondary | ICD-10-CM | POA: Diagnosis not present

## 2017-11-19 DIAGNOSIS — N186 End stage renal disease: Secondary | ICD-10-CM | POA: Diagnosis not present

## 2017-11-19 DIAGNOSIS — Z992 Dependence on renal dialysis: Secondary | ICD-10-CM | POA: Diagnosis not present

## 2017-11-20 DIAGNOSIS — D631 Anemia in chronic kidney disease: Secondary | ICD-10-CM | POA: Diagnosis not present

## 2017-11-20 DIAGNOSIS — Z992 Dependence on renal dialysis: Secondary | ICD-10-CM | POA: Diagnosis not present

## 2017-11-20 DIAGNOSIS — D509 Iron deficiency anemia, unspecified: Secondary | ICD-10-CM | POA: Diagnosis not present

## 2017-11-20 DIAGNOSIS — N186 End stage renal disease: Secondary | ICD-10-CM | POA: Diagnosis not present

## 2017-11-23 DIAGNOSIS — Z992 Dependence on renal dialysis: Secondary | ICD-10-CM | POA: Diagnosis not present

## 2017-11-23 DIAGNOSIS — N186 End stage renal disease: Secondary | ICD-10-CM | POA: Diagnosis not present

## 2017-11-23 DIAGNOSIS — D631 Anemia in chronic kidney disease: Secondary | ICD-10-CM | POA: Diagnosis not present

## 2017-11-23 DIAGNOSIS — D509 Iron deficiency anemia, unspecified: Secondary | ICD-10-CM | POA: Diagnosis not present

## 2017-11-24 DIAGNOSIS — Z452 Encounter for adjustment and management of vascular access device: Secondary | ICD-10-CM | POA: Diagnosis not present

## 2017-11-25 DIAGNOSIS — D631 Anemia in chronic kidney disease: Secondary | ICD-10-CM | POA: Diagnosis not present

## 2017-11-25 DIAGNOSIS — D509 Iron deficiency anemia, unspecified: Secondary | ICD-10-CM | POA: Diagnosis not present

## 2017-11-25 DIAGNOSIS — N186 End stage renal disease: Secondary | ICD-10-CM | POA: Diagnosis not present

## 2017-11-25 DIAGNOSIS — Z992 Dependence on renal dialysis: Secondary | ICD-10-CM | POA: Diagnosis not present

## 2017-11-27 DIAGNOSIS — Z992 Dependence on renal dialysis: Secondary | ICD-10-CM | POA: Diagnosis not present

## 2017-11-27 DIAGNOSIS — N186 End stage renal disease: Secondary | ICD-10-CM | POA: Diagnosis not present

## 2017-11-27 DIAGNOSIS — D631 Anemia in chronic kidney disease: Secondary | ICD-10-CM | POA: Diagnosis not present

## 2017-11-27 DIAGNOSIS — D509 Iron deficiency anemia, unspecified: Secondary | ICD-10-CM | POA: Diagnosis not present

## 2017-11-28 DIAGNOSIS — Z89611 Acquired absence of right leg above knee: Secondary | ICD-10-CM | POA: Diagnosis not present

## 2017-11-28 DIAGNOSIS — R7989 Other specified abnormal findings of blood chemistry: Secondary | ICD-10-CM | POA: Diagnosis not present

## 2017-11-28 DIAGNOSIS — J440 Chronic obstructive pulmonary disease with acute lower respiratory infection: Secondary | ICD-10-CM | POA: Diagnosis not present

## 2017-11-28 DIAGNOSIS — J449 Chronic obstructive pulmonary disease, unspecified: Secondary | ICD-10-CM | POA: Diagnosis not present

## 2017-11-28 DIAGNOSIS — F419 Anxiety disorder, unspecified: Secondary | ICD-10-CM | POA: Diagnosis present

## 2017-11-28 DIAGNOSIS — I120 Hypertensive chronic kidney disease with stage 5 chronic kidney disease or end stage renal disease: Secondary | ICD-10-CM | POA: Diagnosis not present

## 2017-11-28 DIAGNOSIS — R451 Restlessness and agitation: Secondary | ICD-10-CM | POA: Diagnosis not present

## 2017-11-28 DIAGNOSIS — Z905 Acquired absence of kidney: Secondary | ICD-10-CM | POA: Diagnosis not present

## 2017-11-28 DIAGNOSIS — J90 Pleural effusion, not elsewhere classified: Secondary | ICD-10-CM | POA: Diagnosis not present

## 2017-11-28 DIAGNOSIS — D696 Thrombocytopenia, unspecified: Secondary | ICD-10-CM | POA: Diagnosis not present

## 2017-11-28 DIAGNOSIS — Z992 Dependence on renal dialysis: Secondary | ICD-10-CM | POA: Diagnosis not present

## 2017-11-28 DIAGNOSIS — N2581 Secondary hyperparathyroidism of renal origin: Secondary | ICD-10-CM | POA: Diagnosis not present

## 2017-11-28 DIAGNOSIS — E271 Primary adrenocortical insufficiency: Secondary | ICD-10-CM | POA: Diagnosis not present

## 2017-11-28 DIAGNOSIS — I953 Hypotension of hemodialysis: Secondary | ICD-10-CM | POA: Diagnosis not present

## 2017-11-28 DIAGNOSIS — Z942 Lung transplant status: Secondary | ICD-10-CM | POA: Diagnosis not present

## 2017-11-28 DIAGNOSIS — F418 Other specified anxiety disorders: Secondary | ICD-10-CM | POA: Diagnosis not present

## 2017-11-28 DIAGNOSIS — F329 Major depressive disorder, single episode, unspecified: Secondary | ICD-10-CM | POA: Diagnosis present

## 2017-11-28 DIAGNOSIS — Z7951 Long term (current) use of inhaled steroids: Secondary | ICD-10-CM | POA: Diagnosis not present

## 2017-11-28 DIAGNOSIS — G9340 Encephalopathy, unspecified: Secondary | ICD-10-CM | POA: Diagnosis not present

## 2017-11-28 DIAGNOSIS — R945 Abnormal results of liver function studies: Secondary | ICD-10-CM | POA: Diagnosis not present

## 2017-11-28 DIAGNOSIS — I442 Atrioventricular block, complete: Secondary | ICD-10-CM | POA: Diagnosis not present

## 2017-11-28 DIAGNOSIS — B342 Coronavirus infection, unspecified: Secondary | ICD-10-CM | POA: Diagnosis not present

## 2017-11-28 DIAGNOSIS — B199 Unspecified viral hepatitis without hepatic coma: Secondary | ICD-10-CM | POA: Diagnosis present

## 2017-11-28 DIAGNOSIS — R740 Nonspecific elevation of levels of transaminase and lactic acid dehydrogenase [LDH]: Secondary | ICD-10-CM | POA: Diagnosis not present

## 2017-11-28 DIAGNOSIS — Z8639 Personal history of other endocrine, nutritional and metabolic disease: Secondary | ICD-10-CM | POA: Diagnosis not present

## 2017-11-28 DIAGNOSIS — I959 Hypotension, unspecified: Secondary | ICD-10-CM | POA: Diagnosis not present

## 2017-11-28 DIAGNOSIS — I491 Atrial premature depolarization: Secondary | ICD-10-CM | POA: Diagnosis not present

## 2017-11-28 DIAGNOSIS — I440 Atrioventricular block, first degree: Secondary | ICD-10-CM | POA: Diagnosis not present

## 2017-11-28 DIAGNOSIS — F17200 Nicotine dependence, unspecified, uncomplicated: Secondary | ICD-10-CM | POA: Diagnosis not present

## 2017-11-28 DIAGNOSIS — J441 Chronic obstructive pulmonary disease with (acute) exacerbation: Secondary | ICD-10-CM | POA: Diagnosis not present

## 2017-11-28 DIAGNOSIS — R9431 Abnormal electrocardiogram [ECG] [EKG]: Secondary | ICD-10-CM | POA: Diagnosis not present

## 2017-11-28 DIAGNOSIS — E785 Hyperlipidemia, unspecified: Secondary | ICD-10-CM | POA: Diagnosis not present

## 2017-11-28 DIAGNOSIS — L7632 Postprocedural hematoma of skin and subcutaneous tissue following other procedure: Secondary | ICD-10-CM | POA: Diagnosis not present

## 2017-11-28 DIAGNOSIS — Z9981 Dependence on supplemental oxygen: Secondary | ICD-10-CM | POA: Diagnosis not present

## 2017-11-28 DIAGNOSIS — J189 Pneumonia, unspecified organism: Secondary | ICD-10-CM | POA: Diagnosis not present

## 2017-11-28 DIAGNOSIS — E871 Hypo-osmolality and hyponatremia: Secondary | ICD-10-CM | POA: Diagnosis not present

## 2017-11-28 DIAGNOSIS — J151 Pneumonia due to Pseudomonas: Secondary | ICD-10-CM | POA: Diagnosis not present

## 2017-11-28 DIAGNOSIS — B9729 Other coronavirus as the cause of diseases classified elsewhere: Secondary | ICD-10-CM | POA: Diagnosis not present

## 2017-11-28 DIAGNOSIS — I447 Left bundle-branch block, unspecified: Secondary | ICD-10-CM | POA: Diagnosis not present

## 2017-11-28 DIAGNOSIS — R0682 Tachypnea, not elsewhere classified: Secondary | ICD-10-CM | POA: Diagnosis not present

## 2017-11-28 DIAGNOSIS — R0602 Shortness of breath: Secondary | ICD-10-CM | POA: Diagnosis not present

## 2017-11-28 DIAGNOSIS — D62 Acute posthemorrhagic anemia: Secondary | ICD-10-CM | POA: Diagnosis not present

## 2017-11-28 DIAGNOSIS — I272 Pulmonary hypertension, unspecified: Secondary | ICD-10-CM | POA: Diagnosis present

## 2017-11-28 DIAGNOSIS — D649 Anemia, unspecified: Secondary | ICD-10-CM | POA: Diagnosis not present

## 2017-11-28 DIAGNOSIS — Z7982 Long term (current) use of aspirin: Secondary | ICD-10-CM | POA: Diagnosis not present

## 2017-11-28 DIAGNOSIS — I441 Atrioventricular block, second degree: Secondary | ICD-10-CM | POA: Diagnosis not present

## 2017-11-28 DIAGNOSIS — Z87891 Personal history of nicotine dependence: Secondary | ICD-10-CM | POA: Diagnosis not present

## 2017-11-28 DIAGNOSIS — Z89512 Acquired absence of left leg below knee: Secondary | ICD-10-CM | POA: Diagnosis not present

## 2017-11-28 DIAGNOSIS — Z950 Presence of cardiac pacemaker: Secondary | ICD-10-CM | POA: Diagnosis not present

## 2017-11-28 DIAGNOSIS — I129 Hypertensive chronic kidney disease with stage 1 through stage 4 chronic kidney disease, or unspecified chronic kidney disease: Secondary | ICD-10-CM | POA: Diagnosis not present

## 2017-11-28 DIAGNOSIS — Z952 Presence of prosthetic heart valve: Secondary | ICD-10-CM | POA: Diagnosis not present

## 2017-11-28 DIAGNOSIS — E874 Mixed disorder of acid-base balance: Secondary | ICD-10-CM | POA: Diagnosis not present

## 2017-11-28 DIAGNOSIS — R404 Transient alteration of awareness: Secondary | ICD-10-CM | POA: Diagnosis not present

## 2017-11-28 DIAGNOSIS — R791 Abnormal coagulation profile: Secondary | ICD-10-CM | POA: Diagnosis not present

## 2017-11-28 DIAGNOSIS — Z89612 Acquired absence of left leg above knee: Secondary | ICD-10-CM | POA: Diagnosis not present

## 2017-11-28 DIAGNOSIS — D631 Anemia in chronic kidney disease: Secondary | ICD-10-CM | POA: Diagnosis not present

## 2017-11-28 DIAGNOSIS — R079 Chest pain, unspecified: Secondary | ICD-10-CM | POA: Diagnosis not present

## 2017-11-28 DIAGNOSIS — E8770 Fluid overload, unspecified: Secondary | ICD-10-CM | POA: Diagnosis not present

## 2017-11-28 DIAGNOSIS — Z89511 Acquired absence of right leg below knee: Secondary | ICD-10-CM | POA: Diagnosis not present

## 2017-11-28 DIAGNOSIS — I21A1 Myocardial infarction type 2: Secondary | ICD-10-CM | POA: Diagnosis not present

## 2017-11-28 DIAGNOSIS — T82838A Hemorrhage of vascular prosthetic devices, implants and grafts, initial encounter: Secondary | ICD-10-CM | POA: Diagnosis not present

## 2017-11-28 DIAGNOSIS — S1093XA Contusion of unspecified part of neck, initial encounter: Secondary | ICD-10-CM | POA: Diagnosis not present

## 2017-11-28 DIAGNOSIS — R748 Abnormal levels of other serum enzymes: Secondary | ICD-10-CM | POA: Diagnosis not present

## 2017-11-28 DIAGNOSIS — Z95828 Presence of other vascular implants and grafts: Secondary | ICD-10-CM | POA: Diagnosis not present

## 2017-11-28 DIAGNOSIS — Z954 Presence of other heart-valve replacement: Secondary | ICD-10-CM | POA: Diagnosis not present

## 2017-11-28 DIAGNOSIS — B9689 Other specified bacterial agents as the cause of diseases classified elsewhere: Secondary | ICD-10-CM | POA: Diagnosis not present

## 2017-11-28 DIAGNOSIS — I248 Other forms of acute ischemic heart disease: Secondary | ICD-10-CM | POA: Diagnosis present

## 2017-11-28 DIAGNOSIS — Z7901 Long term (current) use of anticoagulants: Secondary | ICD-10-CM | POA: Diagnosis not present

## 2017-11-28 DIAGNOSIS — I4510 Unspecified right bundle-branch block: Secondary | ICD-10-CM | POA: Diagnosis not present

## 2017-11-28 DIAGNOSIS — N186 End stage renal disease: Secondary | ICD-10-CM | POA: Diagnosis present

## 2017-11-28 DIAGNOSIS — I493 Ventricular premature depolarization: Secondary | ICD-10-CM | POA: Diagnosis not present

## 2017-11-28 DIAGNOSIS — E875 Hyperkalemia: Secondary | ICD-10-CM | POA: Diagnosis not present

## 2017-11-28 DIAGNOSIS — E872 Acidosis: Secondary | ICD-10-CM | POA: Diagnosis not present

## 2017-11-28 DIAGNOSIS — N189 Chronic kidney disease, unspecified: Secondary | ICD-10-CM | POA: Diagnosis not present

## 2017-11-28 DIAGNOSIS — R918 Other nonspecific abnormal finding of lung field: Secondary | ICD-10-CM | POA: Diagnosis not present

## 2017-11-28 DIAGNOSIS — I350 Nonrheumatic aortic (valve) stenosis: Secondary | ICD-10-CM | POA: Diagnosis present

## 2017-11-28 DIAGNOSIS — I4589 Other specified conduction disorders: Secondary | ICD-10-CM | POA: Diagnosis not present

## 2017-11-28 DIAGNOSIS — Z79899 Other long term (current) drug therapy: Secondary | ICD-10-CM | POA: Diagnosis not present

## 2017-11-28 DIAGNOSIS — B349 Viral infection, unspecified: Secondary | ICD-10-CM | POA: Diagnosis not present

## 2017-11-28 DIAGNOSIS — R001 Bradycardia, unspecified: Secondary | ICD-10-CM | POA: Diagnosis present

## 2017-12-02 DIAGNOSIS — R9431 Abnormal electrocardiogram [ECG] [EKG]: Secondary | ICD-10-CM | POA: Diagnosis not present

## 2017-12-02 DIAGNOSIS — I447 Left bundle-branch block, unspecified: Secondary | ICD-10-CM | POA: Diagnosis not present

## 2017-12-02 DIAGNOSIS — I491 Atrial premature depolarization: Secondary | ICD-10-CM | POA: Diagnosis not present

## 2017-12-03 DIAGNOSIS — I442 Atrioventricular block, complete: Secondary | ICD-10-CM | POA: Diagnosis not present

## 2017-12-03 DIAGNOSIS — I4510 Unspecified right bundle-branch block: Secondary | ICD-10-CM | POA: Diagnosis not present

## 2017-12-12 DIAGNOSIS — I442 Atrioventricular block, complete: Secondary | ICD-10-CM | POA: Diagnosis not present

## 2017-12-17 DIAGNOSIS — I358 Other nonrheumatic aortic valve disorders: Secondary | ICD-10-CM | POA: Diagnosis not present

## 2017-12-17 DIAGNOSIS — M545 Low back pain: Secondary | ICD-10-CM | POA: Diagnosis not present

## 2017-12-17 DIAGNOSIS — J158 Pneumonia due to other specified bacteria: Secondary | ICD-10-CM | POA: Diagnosis not present

## 2017-12-17 DIAGNOSIS — Z7901 Long term (current) use of anticoagulants: Secondary | ICD-10-CM | POA: Diagnosis not present

## 2017-12-17 DIAGNOSIS — N186 End stage renal disease: Secondary | ICD-10-CM | POA: Diagnosis not present

## 2017-12-17 DIAGNOSIS — I10 Essential (primary) hypertension: Secondary | ICD-10-CM | POA: Diagnosis not present

## 2017-12-17 DIAGNOSIS — Z992 Dependence on renal dialysis: Secondary | ICD-10-CM | POA: Diagnosis not present

## 2017-12-17 DIAGNOSIS — J441 Chronic obstructive pulmonary disease with (acute) exacerbation: Secondary | ICD-10-CM | POA: Diagnosis not present

## 2017-12-18 DIAGNOSIS — N2581 Secondary hyperparathyroidism of renal origin: Secondary | ICD-10-CM | POA: Diagnosis not present

## 2017-12-18 DIAGNOSIS — Z992 Dependence on renal dialysis: Secondary | ICD-10-CM | POA: Diagnosis not present

## 2017-12-18 DIAGNOSIS — N186 End stage renal disease: Secondary | ICD-10-CM | POA: Diagnosis not present

## 2017-12-18 DIAGNOSIS — D509 Iron deficiency anemia, unspecified: Secondary | ICD-10-CM | POA: Diagnosis not present

## 2017-12-18 DIAGNOSIS — D631 Anemia in chronic kidney disease: Secondary | ICD-10-CM | POA: Diagnosis not present

## 2017-12-19 DIAGNOSIS — E271 Primary adrenocortical insufficiency: Secondary | ICD-10-CM | POA: Diagnosis not present

## 2017-12-19 DIAGNOSIS — J441 Chronic obstructive pulmonary disease with (acute) exacerbation: Secondary | ICD-10-CM | POA: Diagnosis not present

## 2017-12-19 DIAGNOSIS — N186 End stage renal disease: Secondary | ICD-10-CM | POA: Diagnosis not present

## 2017-12-19 DIAGNOSIS — Z992 Dependence on renal dialysis: Secondary | ICD-10-CM | POA: Diagnosis not present

## 2017-12-19 DIAGNOSIS — M5030 Other cervical disc degeneration, unspecified cervical region: Secondary | ICD-10-CM | POA: Diagnosis not present

## 2017-12-19 DIAGNOSIS — I120 Hypertensive chronic kidney disease with stage 5 chronic kidney disease or end stage renal disease: Secondary | ICD-10-CM | POA: Diagnosis not present

## 2017-12-21 DIAGNOSIS — N186 End stage renal disease: Secondary | ICD-10-CM | POA: Diagnosis not present

## 2017-12-21 DIAGNOSIS — N2581 Secondary hyperparathyroidism of renal origin: Secondary | ICD-10-CM | POA: Diagnosis not present

## 2017-12-21 DIAGNOSIS — D509 Iron deficiency anemia, unspecified: Secondary | ICD-10-CM | POA: Diagnosis not present

## 2017-12-21 DIAGNOSIS — Z992 Dependence on renal dialysis: Secondary | ICD-10-CM | POA: Diagnosis not present

## 2017-12-21 DIAGNOSIS — D631 Anemia in chronic kidney disease: Secondary | ICD-10-CM | POA: Diagnosis not present

## 2017-12-22 DIAGNOSIS — E271 Primary adrenocortical insufficiency: Secondary | ICD-10-CM | POA: Diagnosis not present

## 2017-12-22 DIAGNOSIS — Z992 Dependence on renal dialysis: Secondary | ICD-10-CM | POA: Diagnosis not present

## 2017-12-22 DIAGNOSIS — I120 Hypertensive chronic kidney disease with stage 5 chronic kidney disease or end stage renal disease: Secondary | ICD-10-CM | POA: Diagnosis not present

## 2017-12-22 DIAGNOSIS — J441 Chronic obstructive pulmonary disease with (acute) exacerbation: Secondary | ICD-10-CM | POA: Diagnosis not present

## 2017-12-22 DIAGNOSIS — N186 End stage renal disease: Secondary | ICD-10-CM | POA: Diagnosis not present

## 2017-12-22 DIAGNOSIS — M5030 Other cervical disc degeneration, unspecified cervical region: Secondary | ICD-10-CM | POA: Diagnosis not present

## 2017-12-23 DIAGNOSIS — D631 Anemia in chronic kidney disease: Secondary | ICD-10-CM | POA: Diagnosis not present

## 2017-12-23 DIAGNOSIS — N2581 Secondary hyperparathyroidism of renal origin: Secondary | ICD-10-CM | POA: Diagnosis not present

## 2017-12-23 DIAGNOSIS — N186 End stage renal disease: Secondary | ICD-10-CM | POA: Diagnosis not present

## 2017-12-23 DIAGNOSIS — Z992 Dependence on renal dialysis: Secondary | ICD-10-CM | POA: Diagnosis not present

## 2017-12-23 DIAGNOSIS — D509 Iron deficiency anemia, unspecified: Secondary | ICD-10-CM | POA: Diagnosis not present

## 2017-12-24 DIAGNOSIS — Z992 Dependence on renal dialysis: Secondary | ICD-10-CM | POA: Diagnosis not present

## 2017-12-24 DIAGNOSIS — E271 Primary adrenocortical insufficiency: Secondary | ICD-10-CM | POA: Diagnosis not present

## 2017-12-24 DIAGNOSIS — N186 End stage renal disease: Secondary | ICD-10-CM | POA: Diagnosis not present

## 2017-12-24 DIAGNOSIS — M5030 Other cervical disc degeneration, unspecified cervical region: Secondary | ICD-10-CM | POA: Diagnosis not present

## 2017-12-24 DIAGNOSIS — J441 Chronic obstructive pulmonary disease with (acute) exacerbation: Secondary | ICD-10-CM | POA: Diagnosis not present

## 2017-12-24 DIAGNOSIS — I120 Hypertensive chronic kidney disease with stage 5 chronic kidney disease or end stage renal disease: Secondary | ICD-10-CM | POA: Diagnosis not present

## 2017-12-25 DIAGNOSIS — D631 Anemia in chronic kidney disease: Secondary | ICD-10-CM | POA: Diagnosis not present

## 2017-12-25 DIAGNOSIS — N2581 Secondary hyperparathyroidism of renal origin: Secondary | ICD-10-CM | POA: Diagnosis not present

## 2017-12-25 DIAGNOSIS — D509 Iron deficiency anemia, unspecified: Secondary | ICD-10-CM | POA: Diagnosis not present

## 2017-12-25 DIAGNOSIS — Z992 Dependence on renal dialysis: Secondary | ICD-10-CM | POA: Diagnosis not present

## 2017-12-25 DIAGNOSIS — N186 End stage renal disease: Secondary | ICD-10-CM | POA: Diagnosis not present

## 2017-12-28 DIAGNOSIS — D509 Iron deficiency anemia, unspecified: Secondary | ICD-10-CM | POA: Diagnosis not present

## 2017-12-28 DIAGNOSIS — N2581 Secondary hyperparathyroidism of renal origin: Secondary | ICD-10-CM | POA: Diagnosis not present

## 2017-12-28 DIAGNOSIS — Z992 Dependence on renal dialysis: Secondary | ICD-10-CM | POA: Diagnosis not present

## 2017-12-28 DIAGNOSIS — N186 End stage renal disease: Secondary | ICD-10-CM | POA: Diagnosis not present

## 2017-12-28 DIAGNOSIS — D631 Anemia in chronic kidney disease: Secondary | ICD-10-CM | POA: Diagnosis not present

## 2017-12-29 DIAGNOSIS — M5030 Other cervical disc degeneration, unspecified cervical region: Secondary | ICD-10-CM | POA: Diagnosis not present

## 2017-12-29 DIAGNOSIS — I120 Hypertensive chronic kidney disease with stage 5 chronic kidney disease or end stage renal disease: Secondary | ICD-10-CM | POA: Diagnosis not present

## 2017-12-29 DIAGNOSIS — N186 End stage renal disease: Secondary | ICD-10-CM | POA: Diagnosis not present

## 2017-12-29 DIAGNOSIS — E271 Primary adrenocortical insufficiency: Secondary | ICD-10-CM | POA: Diagnosis not present

## 2017-12-29 DIAGNOSIS — J441 Chronic obstructive pulmonary disease with (acute) exacerbation: Secondary | ICD-10-CM | POA: Diagnosis not present

## 2017-12-29 DIAGNOSIS — Z992 Dependence on renal dialysis: Secondary | ICD-10-CM | POA: Diagnosis not present

## 2017-12-30 DIAGNOSIS — N2581 Secondary hyperparathyroidism of renal origin: Secondary | ICD-10-CM | POA: Diagnosis not present

## 2017-12-30 DIAGNOSIS — Z992 Dependence on renal dialysis: Secondary | ICD-10-CM | POA: Diagnosis not present

## 2017-12-30 DIAGNOSIS — D509 Iron deficiency anemia, unspecified: Secondary | ICD-10-CM | POA: Diagnosis not present

## 2017-12-30 DIAGNOSIS — N186 End stage renal disease: Secondary | ICD-10-CM | POA: Diagnosis not present

## 2017-12-30 DIAGNOSIS — D631 Anemia in chronic kidney disease: Secondary | ICD-10-CM | POA: Diagnosis not present

## 2017-12-31 DIAGNOSIS — I120 Hypertensive chronic kidney disease with stage 5 chronic kidney disease or end stage renal disease: Secondary | ICD-10-CM | POA: Diagnosis not present

## 2017-12-31 DIAGNOSIS — E271 Primary adrenocortical insufficiency: Secondary | ICD-10-CM | POA: Diagnosis not present

## 2017-12-31 DIAGNOSIS — J441 Chronic obstructive pulmonary disease with (acute) exacerbation: Secondary | ICD-10-CM | POA: Diagnosis not present

## 2017-12-31 DIAGNOSIS — N186 End stage renal disease: Secondary | ICD-10-CM | POA: Diagnosis not present

## 2017-12-31 DIAGNOSIS — M5030 Other cervical disc degeneration, unspecified cervical region: Secondary | ICD-10-CM | POA: Diagnosis not present

## 2017-12-31 DIAGNOSIS — Z992 Dependence on renal dialysis: Secondary | ICD-10-CM | POA: Diagnosis not present

## 2018-01-01 DIAGNOSIS — N186 End stage renal disease: Secondary | ICD-10-CM | POA: Diagnosis not present

## 2018-01-01 DIAGNOSIS — D631 Anemia in chronic kidney disease: Secondary | ICD-10-CM | POA: Diagnosis not present

## 2018-01-01 DIAGNOSIS — Z992 Dependence on renal dialysis: Secondary | ICD-10-CM | POA: Diagnosis not present

## 2018-01-01 DIAGNOSIS — N2581 Secondary hyperparathyroidism of renal origin: Secondary | ICD-10-CM | POA: Diagnosis not present

## 2018-01-01 DIAGNOSIS — D509 Iron deficiency anemia, unspecified: Secondary | ICD-10-CM | POA: Diagnosis not present

## 2018-01-04 DIAGNOSIS — Z992 Dependence on renal dialysis: Secondary | ICD-10-CM | POA: Diagnosis not present

## 2018-01-04 DIAGNOSIS — N2581 Secondary hyperparathyroidism of renal origin: Secondary | ICD-10-CM | POA: Diagnosis not present

## 2018-01-04 DIAGNOSIS — D631 Anemia in chronic kidney disease: Secondary | ICD-10-CM | POA: Diagnosis not present

## 2018-01-04 DIAGNOSIS — D509 Iron deficiency anemia, unspecified: Secondary | ICD-10-CM | POA: Diagnosis not present

## 2018-01-04 DIAGNOSIS — N186 End stage renal disease: Secondary | ICD-10-CM | POA: Diagnosis not present

## 2018-01-05 DIAGNOSIS — I120 Hypertensive chronic kidney disease with stage 5 chronic kidney disease or end stage renal disease: Secondary | ICD-10-CM | POA: Diagnosis not present

## 2018-01-05 DIAGNOSIS — J441 Chronic obstructive pulmonary disease with (acute) exacerbation: Secondary | ICD-10-CM | POA: Diagnosis not present

## 2018-01-05 DIAGNOSIS — M5030 Other cervical disc degeneration, unspecified cervical region: Secondary | ICD-10-CM | POA: Diagnosis not present

## 2018-01-05 DIAGNOSIS — Z992 Dependence on renal dialysis: Secondary | ICD-10-CM | POA: Diagnosis not present

## 2018-01-05 DIAGNOSIS — N186 End stage renal disease: Secondary | ICD-10-CM | POA: Diagnosis not present

## 2018-01-05 DIAGNOSIS — E271 Primary adrenocortical insufficiency: Secondary | ICD-10-CM | POA: Diagnosis not present

## 2018-01-06 DIAGNOSIS — N2581 Secondary hyperparathyroidism of renal origin: Secondary | ICD-10-CM | POA: Diagnosis not present

## 2018-01-06 DIAGNOSIS — Z992 Dependence on renal dialysis: Secondary | ICD-10-CM | POA: Diagnosis not present

## 2018-01-06 DIAGNOSIS — N186 End stage renal disease: Secondary | ICD-10-CM | POA: Diagnosis not present

## 2018-01-06 DIAGNOSIS — D509 Iron deficiency anemia, unspecified: Secondary | ICD-10-CM | POA: Diagnosis not present

## 2018-01-06 DIAGNOSIS — D631 Anemia in chronic kidney disease: Secondary | ICD-10-CM | POA: Diagnosis not present

## 2018-01-07 DIAGNOSIS — E271 Primary adrenocortical insufficiency: Secondary | ICD-10-CM | POA: Diagnosis not present

## 2018-01-07 DIAGNOSIS — J441 Chronic obstructive pulmonary disease with (acute) exacerbation: Secondary | ICD-10-CM | POA: Diagnosis not present

## 2018-01-07 DIAGNOSIS — N186 End stage renal disease: Secondary | ICD-10-CM | POA: Diagnosis not present

## 2018-01-07 DIAGNOSIS — I120 Hypertensive chronic kidney disease with stage 5 chronic kidney disease or end stage renal disease: Secondary | ICD-10-CM | POA: Diagnosis not present

## 2018-01-07 DIAGNOSIS — Z992 Dependence on renal dialysis: Secondary | ICD-10-CM | POA: Diagnosis not present

## 2018-01-07 DIAGNOSIS — M5030 Other cervical disc degeneration, unspecified cervical region: Secondary | ICD-10-CM | POA: Diagnosis not present

## 2018-01-08 DIAGNOSIS — Z992 Dependence on renal dialysis: Secondary | ICD-10-CM | POA: Diagnosis not present

## 2018-01-08 DIAGNOSIS — D509 Iron deficiency anemia, unspecified: Secondary | ICD-10-CM | POA: Diagnosis not present

## 2018-01-08 DIAGNOSIS — N186 End stage renal disease: Secondary | ICD-10-CM | POA: Diagnosis not present

## 2018-01-08 DIAGNOSIS — D631 Anemia in chronic kidney disease: Secondary | ICD-10-CM | POA: Diagnosis not present

## 2018-01-08 DIAGNOSIS — N2581 Secondary hyperparathyroidism of renal origin: Secondary | ICD-10-CM | POA: Diagnosis not present

## 2018-01-11 DIAGNOSIS — N2581 Secondary hyperparathyroidism of renal origin: Secondary | ICD-10-CM | POA: Diagnosis not present

## 2018-01-11 DIAGNOSIS — N186 End stage renal disease: Secondary | ICD-10-CM | POA: Diagnosis not present

## 2018-01-11 DIAGNOSIS — Z992 Dependence on renal dialysis: Secondary | ICD-10-CM | POA: Diagnosis not present

## 2018-01-11 DIAGNOSIS — E119 Type 2 diabetes mellitus without complications: Secondary | ICD-10-CM | POA: Diagnosis not present

## 2018-01-11 DIAGNOSIS — D631 Anemia in chronic kidney disease: Secondary | ICD-10-CM | POA: Diagnosis not present

## 2018-01-11 DIAGNOSIS — D509 Iron deficiency anemia, unspecified: Secondary | ICD-10-CM | POA: Diagnosis not present

## 2018-01-12 DIAGNOSIS — M5030 Other cervical disc degeneration, unspecified cervical region: Secondary | ICD-10-CM | POA: Diagnosis not present

## 2018-01-12 DIAGNOSIS — J441 Chronic obstructive pulmonary disease with (acute) exacerbation: Secondary | ICD-10-CM | POA: Diagnosis not present

## 2018-01-12 DIAGNOSIS — N186 End stage renal disease: Secondary | ICD-10-CM | POA: Diagnosis not present

## 2018-01-12 DIAGNOSIS — Z992 Dependence on renal dialysis: Secondary | ICD-10-CM | POA: Diagnosis not present

## 2018-01-12 DIAGNOSIS — E271 Primary adrenocortical insufficiency: Secondary | ICD-10-CM | POA: Diagnosis not present

## 2018-01-12 DIAGNOSIS — I120 Hypertensive chronic kidney disease with stage 5 chronic kidney disease or end stage renal disease: Secondary | ICD-10-CM | POA: Diagnosis not present

## 2018-01-13 DIAGNOSIS — N186 End stage renal disease: Secondary | ICD-10-CM | POA: Diagnosis not present

## 2018-01-13 DIAGNOSIS — D631 Anemia in chronic kidney disease: Secondary | ICD-10-CM | POA: Diagnosis not present

## 2018-01-13 DIAGNOSIS — Z992 Dependence on renal dialysis: Secondary | ICD-10-CM | POA: Diagnosis not present

## 2018-01-13 DIAGNOSIS — N2581 Secondary hyperparathyroidism of renal origin: Secondary | ICD-10-CM | POA: Diagnosis not present

## 2018-01-13 DIAGNOSIS — D509 Iron deficiency anemia, unspecified: Secondary | ICD-10-CM | POA: Diagnosis not present

## 2018-01-14 DIAGNOSIS — M5030 Other cervical disc degeneration, unspecified cervical region: Secondary | ICD-10-CM | POA: Diagnosis not present

## 2018-01-14 DIAGNOSIS — E271 Primary adrenocortical insufficiency: Secondary | ICD-10-CM | POA: Diagnosis not present

## 2018-01-14 DIAGNOSIS — I120 Hypertensive chronic kidney disease with stage 5 chronic kidney disease or end stage renal disease: Secondary | ICD-10-CM | POA: Diagnosis not present

## 2018-01-14 DIAGNOSIS — N186 End stage renal disease: Secondary | ICD-10-CM | POA: Diagnosis not present

## 2018-01-14 DIAGNOSIS — J441 Chronic obstructive pulmonary disease with (acute) exacerbation: Secondary | ICD-10-CM | POA: Diagnosis not present

## 2018-01-14 DIAGNOSIS — Z992 Dependence on renal dialysis: Secondary | ICD-10-CM | POA: Diagnosis not present

## 2018-01-15 DIAGNOSIS — Z992 Dependence on renal dialysis: Secondary | ICD-10-CM | POA: Diagnosis not present

## 2018-01-15 DIAGNOSIS — N186 End stage renal disease: Secondary | ICD-10-CM | POA: Diagnosis not present

## 2018-01-15 DIAGNOSIS — D631 Anemia in chronic kidney disease: Secondary | ICD-10-CM | POA: Diagnosis not present

## 2018-01-15 DIAGNOSIS — D509 Iron deficiency anemia, unspecified: Secondary | ICD-10-CM | POA: Diagnosis not present

## 2018-01-15 DIAGNOSIS — N2581 Secondary hyperparathyroidism of renal origin: Secondary | ICD-10-CM | POA: Diagnosis not present

## 2018-01-17 DIAGNOSIS — I700 Atherosclerosis of aorta: Secondary | ICD-10-CM | POA: Diagnosis not present

## 2018-01-17 DIAGNOSIS — E271 Primary adrenocortical insufficiency: Secondary | ICD-10-CM | POA: Diagnosis not present

## 2018-01-17 DIAGNOSIS — N189 Chronic kidney disease, unspecified: Secondary | ICD-10-CM | POA: Diagnosis not present

## 2018-01-17 DIAGNOSIS — R1084 Generalized abdominal pain: Secondary | ICD-10-CM | POA: Diagnosis not present

## 2018-01-17 DIAGNOSIS — Z7901 Long term (current) use of anticoagulants: Secondary | ICD-10-CM | POA: Diagnosis not present

## 2018-01-17 DIAGNOSIS — F419 Anxiety disorder, unspecified: Secondary | ICD-10-CM | POA: Diagnosis not present

## 2018-01-17 DIAGNOSIS — Z89512 Acquired absence of left leg below knee: Secondary | ICD-10-CM | POA: Diagnosis not present

## 2018-01-17 DIAGNOSIS — Z89511 Acquired absence of right leg below knee: Secondary | ICD-10-CM | POA: Diagnosis not present

## 2018-01-17 DIAGNOSIS — N186 End stage renal disease: Secondary | ICD-10-CM | POA: Diagnosis not present

## 2018-01-17 DIAGNOSIS — R1032 Left lower quadrant pain: Secondary | ICD-10-CM | POA: Diagnosis not present

## 2018-01-17 DIAGNOSIS — R531 Weakness: Secondary | ICD-10-CM | POA: Diagnosis not present

## 2018-01-17 DIAGNOSIS — R339 Retention of urine, unspecified: Secondary | ICD-10-CM | POA: Diagnosis not present

## 2018-01-17 DIAGNOSIS — Z950 Presence of cardiac pacemaker: Secondary | ICD-10-CM | POA: Diagnosis not present

## 2018-01-17 DIAGNOSIS — Z79899 Other long term (current) drug therapy: Secondary | ICD-10-CM | POA: Diagnosis not present

## 2018-01-17 DIAGNOSIS — R109 Unspecified abdominal pain: Secondary | ICD-10-CM | POA: Diagnosis not present

## 2018-01-17 DIAGNOSIS — E039 Hypothyroidism, unspecified: Secondary | ICD-10-CM | POA: Diagnosis not present

## 2018-01-17 DIAGNOSIS — Z992 Dependence on renal dialysis: Secondary | ICD-10-CM | POA: Diagnosis not present

## 2018-01-17 DIAGNOSIS — F329 Major depressive disorder, single episode, unspecified: Secondary | ICD-10-CM | POA: Diagnosis not present

## 2018-01-17 DIAGNOSIS — Z905 Acquired absence of kidney: Secondary | ICD-10-CM | POA: Diagnosis not present

## 2018-01-17 DIAGNOSIS — D649 Anemia, unspecified: Secondary | ICD-10-CM | POA: Diagnosis not present

## 2018-01-17 DIAGNOSIS — I129 Hypertensive chronic kidney disease with stage 1 through stage 4 chronic kidney disease, or unspecified chronic kidney disease: Secondary | ICD-10-CM | POA: Diagnosis not present

## 2018-01-17 DIAGNOSIS — F17200 Nicotine dependence, unspecified, uncomplicated: Secondary | ICD-10-CM | POA: Diagnosis not present

## 2018-01-18 DIAGNOSIS — D509 Iron deficiency anemia, unspecified: Secondary | ICD-10-CM | POA: Diagnosis not present

## 2018-01-18 DIAGNOSIS — N186 End stage renal disease: Secondary | ICD-10-CM | POA: Diagnosis not present

## 2018-01-18 DIAGNOSIS — Z992 Dependence on renal dialysis: Secondary | ICD-10-CM | POA: Diagnosis not present

## 2018-01-18 DIAGNOSIS — D631 Anemia in chronic kidney disease: Secondary | ICD-10-CM | POA: Diagnosis not present

## 2018-01-19 DIAGNOSIS — J441 Chronic obstructive pulmonary disease with (acute) exacerbation: Secondary | ICD-10-CM | POA: Diagnosis not present

## 2018-01-19 DIAGNOSIS — N186 End stage renal disease: Secondary | ICD-10-CM | POA: Diagnosis not present

## 2018-01-19 DIAGNOSIS — M5030 Other cervical disc degeneration, unspecified cervical region: Secondary | ICD-10-CM | POA: Diagnosis not present

## 2018-01-19 DIAGNOSIS — I120 Hypertensive chronic kidney disease with stage 5 chronic kidney disease or end stage renal disease: Secondary | ICD-10-CM | POA: Diagnosis not present

## 2018-01-19 DIAGNOSIS — E271 Primary adrenocortical insufficiency: Secondary | ICD-10-CM | POA: Diagnosis not present

## 2018-01-19 DIAGNOSIS — Z992 Dependence on renal dialysis: Secondary | ICD-10-CM | POA: Diagnosis not present

## 2018-01-20 DIAGNOSIS — D631 Anemia in chronic kidney disease: Secondary | ICD-10-CM | POA: Diagnosis not present

## 2018-01-20 DIAGNOSIS — Z992 Dependence on renal dialysis: Secondary | ICD-10-CM | POA: Diagnosis not present

## 2018-01-20 DIAGNOSIS — D509 Iron deficiency anemia, unspecified: Secondary | ICD-10-CM | POA: Diagnosis not present

## 2018-01-20 DIAGNOSIS — N186 End stage renal disease: Secondary | ICD-10-CM | POA: Diagnosis not present

## 2018-01-22 DIAGNOSIS — N186 End stage renal disease: Secondary | ICD-10-CM | POA: Diagnosis not present

## 2018-01-22 DIAGNOSIS — D631 Anemia in chronic kidney disease: Secondary | ICD-10-CM | POA: Diagnosis not present

## 2018-01-22 DIAGNOSIS — D509 Iron deficiency anemia, unspecified: Secondary | ICD-10-CM | POA: Diagnosis not present

## 2018-01-22 DIAGNOSIS — Z992 Dependence on renal dialysis: Secondary | ICD-10-CM | POA: Diagnosis not present

## 2018-01-25 DIAGNOSIS — D631 Anemia in chronic kidney disease: Secondary | ICD-10-CM | POA: Diagnosis not present

## 2018-01-25 DIAGNOSIS — D509 Iron deficiency anemia, unspecified: Secondary | ICD-10-CM | POA: Diagnosis not present

## 2018-01-25 DIAGNOSIS — Z992 Dependence on renal dialysis: Secondary | ICD-10-CM | POA: Diagnosis not present

## 2018-01-25 DIAGNOSIS — N186 End stage renal disease: Secondary | ICD-10-CM | POA: Diagnosis not present

## 2018-01-27 DIAGNOSIS — Z992 Dependence on renal dialysis: Secondary | ICD-10-CM | POA: Diagnosis not present

## 2018-01-27 DIAGNOSIS — D509 Iron deficiency anemia, unspecified: Secondary | ICD-10-CM | POA: Diagnosis not present

## 2018-01-27 DIAGNOSIS — N186 End stage renal disease: Secondary | ICD-10-CM | POA: Diagnosis not present

## 2018-01-27 DIAGNOSIS — D631 Anemia in chronic kidney disease: Secondary | ICD-10-CM | POA: Diagnosis not present

## 2018-01-29 DIAGNOSIS — D631 Anemia in chronic kidney disease: Secondary | ICD-10-CM | POA: Diagnosis not present

## 2018-01-29 DIAGNOSIS — Z992 Dependence on renal dialysis: Secondary | ICD-10-CM | POA: Diagnosis not present

## 2018-01-29 DIAGNOSIS — N186 End stage renal disease: Secondary | ICD-10-CM | POA: Diagnosis not present

## 2018-01-29 DIAGNOSIS — D509 Iron deficiency anemia, unspecified: Secondary | ICD-10-CM | POA: Diagnosis not present

## 2018-02-01 DIAGNOSIS — N186 End stage renal disease: Secondary | ICD-10-CM | POA: Diagnosis not present

## 2018-02-01 DIAGNOSIS — D631 Anemia in chronic kidney disease: Secondary | ICD-10-CM | POA: Diagnosis not present

## 2018-02-01 DIAGNOSIS — D509 Iron deficiency anemia, unspecified: Secondary | ICD-10-CM | POA: Diagnosis not present

## 2018-02-01 DIAGNOSIS — Z992 Dependence on renal dialysis: Secondary | ICD-10-CM | POA: Diagnosis not present

## 2018-02-03 DIAGNOSIS — N186 End stage renal disease: Secondary | ICD-10-CM | POA: Diagnosis not present

## 2018-02-03 DIAGNOSIS — Z992 Dependence on renal dialysis: Secondary | ICD-10-CM | POA: Diagnosis not present

## 2018-02-03 DIAGNOSIS — D631 Anemia in chronic kidney disease: Secondary | ICD-10-CM | POA: Diagnosis not present

## 2018-02-03 DIAGNOSIS — D509 Iron deficiency anemia, unspecified: Secondary | ICD-10-CM | POA: Diagnosis not present

## 2018-02-05 DIAGNOSIS — Z992 Dependence on renal dialysis: Secondary | ICD-10-CM | POA: Diagnosis not present

## 2018-02-05 DIAGNOSIS — D509 Iron deficiency anemia, unspecified: Secondary | ICD-10-CM | POA: Diagnosis not present

## 2018-02-05 DIAGNOSIS — D631 Anemia in chronic kidney disease: Secondary | ICD-10-CM | POA: Diagnosis not present

## 2018-02-05 DIAGNOSIS — N186 End stage renal disease: Secondary | ICD-10-CM | POA: Diagnosis not present

## 2018-02-08 DIAGNOSIS — D631 Anemia in chronic kidney disease: Secondary | ICD-10-CM | POA: Diagnosis not present

## 2018-02-08 DIAGNOSIS — D509 Iron deficiency anemia, unspecified: Secondary | ICD-10-CM | POA: Diagnosis not present

## 2018-02-08 DIAGNOSIS — Z992 Dependence on renal dialysis: Secondary | ICD-10-CM | POA: Diagnosis not present

## 2018-02-08 DIAGNOSIS — N186 End stage renal disease: Secondary | ICD-10-CM | POA: Diagnosis not present

## 2018-02-09 DIAGNOSIS — J441 Chronic obstructive pulmonary disease with (acute) exacerbation: Secondary | ICD-10-CM | POA: Diagnosis not present

## 2018-02-09 DIAGNOSIS — M818 Other osteoporosis without current pathological fracture: Secondary | ICD-10-CM | POA: Diagnosis not present

## 2018-02-09 DIAGNOSIS — Z7901 Long term (current) use of anticoagulants: Secondary | ICD-10-CM | POA: Diagnosis not present

## 2018-02-09 DIAGNOSIS — Z1389 Encounter for screening for other disorder: Secondary | ICD-10-CM | POA: Diagnosis not present

## 2018-02-09 DIAGNOSIS — Z0000 Encounter for general adult medical examination without abnormal findings: Secondary | ICD-10-CM | POA: Diagnosis not present

## 2018-02-09 DIAGNOSIS — I350 Nonrheumatic aortic (valve) stenosis: Secondary | ICD-10-CM | POA: Diagnosis not present

## 2018-02-09 DIAGNOSIS — M545 Low back pain: Secondary | ICD-10-CM | POA: Diagnosis not present

## 2018-02-09 DIAGNOSIS — I10 Essential (primary) hypertension: Secondary | ICD-10-CM | POA: Diagnosis not present

## 2018-02-10 DIAGNOSIS — N186 End stage renal disease: Secondary | ICD-10-CM | POA: Diagnosis not present

## 2018-02-10 DIAGNOSIS — D509 Iron deficiency anemia, unspecified: Secondary | ICD-10-CM | POA: Diagnosis not present

## 2018-02-10 DIAGNOSIS — Z992 Dependence on renal dialysis: Secondary | ICD-10-CM | POA: Diagnosis not present

## 2018-02-10 DIAGNOSIS — D631 Anemia in chronic kidney disease: Secondary | ICD-10-CM | POA: Diagnosis not present

## 2018-02-12 DIAGNOSIS — D631 Anemia in chronic kidney disease: Secondary | ICD-10-CM | POA: Diagnosis not present

## 2018-02-12 DIAGNOSIS — D509 Iron deficiency anemia, unspecified: Secondary | ICD-10-CM | POA: Diagnosis not present

## 2018-02-12 DIAGNOSIS — Z992 Dependence on renal dialysis: Secondary | ICD-10-CM | POA: Diagnosis not present

## 2018-02-12 DIAGNOSIS — N186 End stage renal disease: Secondary | ICD-10-CM | POA: Diagnosis not present

## 2018-02-15 DIAGNOSIS — N186 End stage renal disease: Secondary | ICD-10-CM | POA: Diagnosis not present

## 2018-02-15 DIAGNOSIS — Z992 Dependence on renal dialysis: Secondary | ICD-10-CM | POA: Diagnosis not present

## 2018-02-15 DIAGNOSIS — D631 Anemia in chronic kidney disease: Secondary | ICD-10-CM | POA: Diagnosis not present

## 2018-02-15 DIAGNOSIS — D509 Iron deficiency anemia, unspecified: Secondary | ICD-10-CM | POA: Diagnosis not present

## 2018-02-16 DIAGNOSIS — Z992 Dependence on renal dialysis: Secondary | ICD-10-CM | POA: Diagnosis not present

## 2018-02-16 DIAGNOSIS — N186 End stage renal disease: Secondary | ICD-10-CM | POA: Diagnosis not present

## 2018-02-17 DIAGNOSIS — D631 Anemia in chronic kidney disease: Secondary | ICD-10-CM | POA: Diagnosis not present

## 2018-02-17 DIAGNOSIS — N186 End stage renal disease: Secondary | ICD-10-CM | POA: Diagnosis not present

## 2018-02-17 DIAGNOSIS — D509 Iron deficiency anemia, unspecified: Secondary | ICD-10-CM | POA: Diagnosis not present

## 2018-02-17 DIAGNOSIS — Z992 Dependence on renal dialysis: Secondary | ICD-10-CM | POA: Diagnosis not present

## 2018-02-19 DIAGNOSIS — D631 Anemia in chronic kidney disease: Secondary | ICD-10-CM | POA: Diagnosis not present

## 2018-02-19 DIAGNOSIS — N186 End stage renal disease: Secondary | ICD-10-CM | POA: Diagnosis not present

## 2018-02-19 DIAGNOSIS — Z992 Dependence on renal dialysis: Secondary | ICD-10-CM | POA: Diagnosis not present

## 2018-02-19 DIAGNOSIS — D509 Iron deficiency anemia, unspecified: Secondary | ICD-10-CM | POA: Diagnosis not present

## 2018-02-22 DIAGNOSIS — D509 Iron deficiency anemia, unspecified: Secondary | ICD-10-CM | POA: Diagnosis not present

## 2018-02-22 DIAGNOSIS — D631 Anemia in chronic kidney disease: Secondary | ICD-10-CM | POA: Diagnosis not present

## 2018-02-22 DIAGNOSIS — N186 End stage renal disease: Secondary | ICD-10-CM | POA: Diagnosis not present

## 2018-02-22 DIAGNOSIS — Z992 Dependence on renal dialysis: Secondary | ICD-10-CM | POA: Diagnosis not present

## 2018-02-24 DIAGNOSIS — Z89511 Acquired absence of right leg below knee: Secondary | ICD-10-CM | POA: Diagnosis not present

## 2018-02-24 DIAGNOSIS — M545 Low back pain: Secondary | ICD-10-CM | POA: Diagnosis present

## 2018-02-24 DIAGNOSIS — Z7951 Long term (current) use of inhaled steroids: Secondary | ICD-10-CM | POA: Diagnosis not present

## 2018-02-24 DIAGNOSIS — I509 Heart failure, unspecified: Secondary | ICD-10-CM | POA: Diagnosis not present

## 2018-02-24 DIAGNOSIS — J168 Pneumonia due to other specified infectious organisms: Secondary | ICD-10-CM | POA: Diagnosis not present

## 2018-02-24 DIAGNOSIS — I132 Hypertensive heart and chronic kidney disease with heart failure and with stage 5 chronic kidney disease, or end stage renal disease: Secondary | ICD-10-CM | POA: Diagnosis not present

## 2018-02-24 DIAGNOSIS — Z79899 Other long term (current) drug therapy: Secondary | ICD-10-CM | POA: Diagnosis not present

## 2018-02-24 DIAGNOSIS — F17220 Nicotine dependence, chewing tobacco, uncomplicated: Secondary | ICD-10-CM | POA: Diagnosis present

## 2018-02-24 DIAGNOSIS — M810 Age-related osteoporosis without current pathological fracture: Secondary | ICD-10-CM | POA: Diagnosis present

## 2018-02-24 DIAGNOSIS — Z7984 Long term (current) use of oral hypoglycemic drugs: Secondary | ICD-10-CM | POA: Diagnosis not present

## 2018-02-24 DIAGNOSIS — E1151 Type 2 diabetes mellitus with diabetic peripheral angiopathy without gangrene: Secondary | ICD-10-CM | POA: Diagnosis present

## 2018-02-24 DIAGNOSIS — Z801 Family history of malignant neoplasm of trachea, bronchus and lung: Secondary | ICD-10-CM | POA: Diagnosis not present

## 2018-02-24 DIAGNOSIS — J449 Chronic obstructive pulmonary disease, unspecified: Secondary | ICD-10-CM | POA: Diagnosis not present

## 2018-02-24 DIAGNOSIS — Z8249 Family history of ischemic heart disease and other diseases of the circulatory system: Secondary | ICD-10-CM | POA: Diagnosis not present

## 2018-02-24 DIAGNOSIS — J440 Chronic obstructive pulmonary disease with acute lower respiratory infection: Secondary | ICD-10-CM | POA: Diagnosis not present

## 2018-02-24 DIAGNOSIS — Z825 Family history of asthma and other chronic lower respiratory diseases: Secondary | ICD-10-CM | POA: Diagnosis not present

## 2018-02-24 DIAGNOSIS — J180 Bronchopneumonia, unspecified organism: Secondary | ICD-10-CM | POA: Diagnosis present

## 2018-02-24 DIAGNOSIS — E1122 Type 2 diabetes mellitus with diabetic chronic kidney disease: Secondary | ICD-10-CM | POA: Diagnosis present

## 2018-02-24 DIAGNOSIS — I5041 Acute combined systolic (congestive) and diastolic (congestive) heart failure: Secondary | ICD-10-CM | POA: Diagnosis not present

## 2018-02-24 DIAGNOSIS — N186 End stage renal disease: Secondary | ICD-10-CM | POA: Diagnosis not present

## 2018-02-24 DIAGNOSIS — I214 Non-ST elevation (NSTEMI) myocardial infarction: Secondary | ICD-10-CM | POA: Diagnosis not present

## 2018-02-24 DIAGNOSIS — R0682 Tachypnea, not elsewhere classified: Secondary | ICD-10-CM | POA: Diagnosis not present

## 2018-02-24 DIAGNOSIS — Z952 Presence of prosthetic heart valve: Secondary | ICD-10-CM | POA: Diagnosis not present

## 2018-02-24 DIAGNOSIS — R0602 Shortness of breath: Secondary | ICD-10-CM | POA: Diagnosis not present

## 2018-02-24 DIAGNOSIS — Z7901 Long term (current) use of anticoagulants: Secondary | ICD-10-CM | POA: Diagnosis not present

## 2018-02-24 DIAGNOSIS — G8929 Other chronic pain: Secondary | ICD-10-CM | POA: Diagnosis present

## 2018-02-24 DIAGNOSIS — J90 Pleural effusion, not elsewhere classified: Secondary | ICD-10-CM | POA: Diagnosis not present

## 2018-02-24 DIAGNOSIS — I083 Combined rheumatic disorders of mitral, aortic and tricuspid valves: Secondary | ICD-10-CM | POA: Diagnosis present

## 2018-02-24 DIAGNOSIS — Z89512 Acquired absence of left leg below knee: Secondary | ICD-10-CM | POA: Diagnosis not present

## 2018-02-24 DIAGNOSIS — E271 Primary adrenocortical insufficiency: Secondary | ICD-10-CM | POA: Diagnosis present

## 2018-02-24 DIAGNOSIS — Z992 Dependence on renal dialysis: Secondary | ICD-10-CM | POA: Diagnosis not present

## 2018-03-04 DIAGNOSIS — E1151 Type 2 diabetes mellitus with diabetic peripheral angiopathy without gangrene: Secondary | ICD-10-CM | POA: Diagnosis not present

## 2018-03-04 DIAGNOSIS — E271 Primary adrenocortical insufficiency: Secondary | ICD-10-CM | POA: Diagnosis not present

## 2018-03-04 DIAGNOSIS — J180 Bronchopneumonia, unspecified organism: Secondary | ICD-10-CM | POA: Diagnosis not present

## 2018-03-04 DIAGNOSIS — L89322 Pressure ulcer of left buttock, stage 2: Secondary | ICD-10-CM | POA: Diagnosis not present

## 2018-03-04 DIAGNOSIS — G8929 Other chronic pain: Secondary | ICD-10-CM | POA: Diagnosis not present

## 2018-03-04 DIAGNOSIS — Z5181 Encounter for therapeutic drug level monitoring: Secondary | ICD-10-CM | POA: Diagnosis not present

## 2018-03-04 DIAGNOSIS — Z89512 Acquired absence of left leg below knee: Secondary | ICD-10-CM | POA: Diagnosis not present

## 2018-03-04 DIAGNOSIS — Z7901 Long term (current) use of anticoagulants: Secondary | ICD-10-CM | POA: Diagnosis not present

## 2018-03-04 DIAGNOSIS — N186 End stage renal disease: Secondary | ICD-10-CM | POA: Diagnosis not present

## 2018-03-04 DIAGNOSIS — I214 Non-ST elevation (NSTEMI) myocardial infarction: Secondary | ICD-10-CM | POA: Diagnosis not present

## 2018-03-04 DIAGNOSIS — E1140 Type 2 diabetes mellitus with diabetic neuropathy, unspecified: Secondary | ICD-10-CM | POA: Diagnosis not present

## 2018-03-04 DIAGNOSIS — F329 Major depressive disorder, single episode, unspecified: Secondary | ICD-10-CM | POA: Diagnosis not present

## 2018-03-04 DIAGNOSIS — I340 Nonrheumatic mitral (valve) insufficiency: Secondary | ICD-10-CM | POA: Diagnosis not present

## 2018-03-04 DIAGNOSIS — Z952 Presence of prosthetic heart valve: Secondary | ICD-10-CM | POA: Diagnosis not present

## 2018-03-04 DIAGNOSIS — Z89511 Acquired absence of right leg below knee: Secondary | ICD-10-CM | POA: Diagnosis not present

## 2018-03-04 DIAGNOSIS — I2510 Atherosclerotic heart disease of native coronary artery without angina pectoris: Secondary | ICD-10-CM | POA: Diagnosis not present

## 2018-03-04 DIAGNOSIS — M545 Low back pain: Secondary | ICD-10-CM | POA: Diagnosis not present

## 2018-03-04 DIAGNOSIS — Z992 Dependence on renal dialysis: Secondary | ICD-10-CM | POA: Diagnosis not present

## 2018-03-04 DIAGNOSIS — I5041 Acute combined systolic (congestive) and diastolic (congestive) heart failure: Secondary | ICD-10-CM | POA: Diagnosis not present

## 2018-03-04 DIAGNOSIS — D631 Anemia in chronic kidney disease: Secondary | ICD-10-CM | POA: Diagnosis not present

## 2018-03-04 DIAGNOSIS — E1122 Type 2 diabetes mellitus with diabetic chronic kidney disease: Secondary | ICD-10-CM | POA: Diagnosis not present

## 2018-03-04 DIAGNOSIS — J440 Chronic obstructive pulmonary disease with acute lower respiratory infection: Secondary | ICD-10-CM | POA: Diagnosis not present

## 2018-03-04 DIAGNOSIS — J168 Pneumonia due to other specified infectious organisms: Secondary | ICD-10-CM | POA: Diagnosis not present

## 2018-03-04 DIAGNOSIS — M810 Age-related osteoporosis without current pathological fracture: Secondary | ICD-10-CM | POA: Diagnosis not present

## 2018-03-05 DIAGNOSIS — D631 Anemia in chronic kidney disease: Secondary | ICD-10-CM | POA: Diagnosis not present

## 2018-03-05 DIAGNOSIS — D509 Iron deficiency anemia, unspecified: Secondary | ICD-10-CM | POA: Diagnosis not present

## 2018-03-05 DIAGNOSIS — Z992 Dependence on renal dialysis: Secondary | ICD-10-CM | POA: Diagnosis not present

## 2018-03-05 DIAGNOSIS — N186 End stage renal disease: Secondary | ICD-10-CM | POA: Diagnosis not present

## 2018-03-08 DIAGNOSIS — Z992 Dependence on renal dialysis: Secondary | ICD-10-CM | POA: Diagnosis not present

## 2018-03-08 DIAGNOSIS — D509 Iron deficiency anemia, unspecified: Secondary | ICD-10-CM | POA: Diagnosis not present

## 2018-03-08 DIAGNOSIS — D631 Anemia in chronic kidney disease: Secondary | ICD-10-CM | POA: Diagnosis not present

## 2018-03-08 DIAGNOSIS — N186 End stage renal disease: Secondary | ICD-10-CM | POA: Diagnosis not present

## 2018-03-09 DIAGNOSIS — L89322 Pressure ulcer of left buttock, stage 2: Secondary | ICD-10-CM | POA: Diagnosis not present

## 2018-03-09 DIAGNOSIS — E1122 Type 2 diabetes mellitus with diabetic chronic kidney disease: Secondary | ICD-10-CM | POA: Diagnosis not present

## 2018-03-09 DIAGNOSIS — I5041 Acute combined systolic (congestive) and diastolic (congestive) heart failure: Secondary | ICD-10-CM | POA: Diagnosis not present

## 2018-03-09 DIAGNOSIS — J440 Chronic obstructive pulmonary disease with acute lower respiratory infection: Secondary | ICD-10-CM | POA: Diagnosis not present

## 2018-03-09 DIAGNOSIS — J180 Bronchopneumonia, unspecified organism: Secondary | ICD-10-CM | POA: Diagnosis not present

## 2018-03-09 DIAGNOSIS — I214 Non-ST elevation (NSTEMI) myocardial infarction: Secondary | ICD-10-CM | POA: Diagnosis not present

## 2018-03-10 DIAGNOSIS — D631 Anemia in chronic kidney disease: Secondary | ICD-10-CM | POA: Diagnosis not present

## 2018-03-10 DIAGNOSIS — N186 End stage renal disease: Secondary | ICD-10-CM | POA: Diagnosis not present

## 2018-03-10 DIAGNOSIS — J154 Pneumonia due to other streptococci: Secondary | ICD-10-CM | POA: Diagnosis not present

## 2018-03-10 DIAGNOSIS — Z992 Dependence on renal dialysis: Secondary | ICD-10-CM | POA: Diagnosis not present

## 2018-03-10 DIAGNOSIS — D509 Iron deficiency anemia, unspecified: Secondary | ICD-10-CM | POA: Diagnosis not present

## 2018-03-12 DIAGNOSIS — E1122 Type 2 diabetes mellitus with diabetic chronic kidney disease: Secondary | ICD-10-CM | POA: Diagnosis not present

## 2018-03-12 DIAGNOSIS — Z992 Dependence on renal dialysis: Secondary | ICD-10-CM | POA: Diagnosis not present

## 2018-03-12 DIAGNOSIS — J440 Chronic obstructive pulmonary disease with acute lower respiratory infection: Secondary | ICD-10-CM | POA: Diagnosis not present

## 2018-03-12 DIAGNOSIS — D631 Anemia in chronic kidney disease: Secondary | ICD-10-CM | POA: Diagnosis not present

## 2018-03-12 DIAGNOSIS — D509 Iron deficiency anemia, unspecified: Secondary | ICD-10-CM | POA: Diagnosis not present

## 2018-03-12 DIAGNOSIS — J180 Bronchopneumonia, unspecified organism: Secondary | ICD-10-CM | POA: Diagnosis not present

## 2018-03-12 DIAGNOSIS — N186 End stage renal disease: Secondary | ICD-10-CM | POA: Diagnosis not present

## 2018-03-12 DIAGNOSIS — L89322 Pressure ulcer of left buttock, stage 2: Secondary | ICD-10-CM | POA: Diagnosis not present

## 2018-03-12 DIAGNOSIS — I5041 Acute combined systolic (congestive) and diastolic (congestive) heart failure: Secondary | ICD-10-CM | POA: Diagnosis not present

## 2018-03-12 DIAGNOSIS — I214 Non-ST elevation (NSTEMI) myocardial infarction: Secondary | ICD-10-CM | POA: Diagnosis not present

## 2018-03-15 DIAGNOSIS — I5041 Acute combined systolic (congestive) and diastolic (congestive) heart failure: Secondary | ICD-10-CM | POA: Diagnosis not present

## 2018-03-15 DIAGNOSIS — D631 Anemia in chronic kidney disease: Secondary | ICD-10-CM | POA: Diagnosis not present

## 2018-03-15 DIAGNOSIS — L89322 Pressure ulcer of left buttock, stage 2: Secondary | ICD-10-CM | POA: Diagnosis not present

## 2018-03-15 DIAGNOSIS — J180 Bronchopneumonia, unspecified organism: Secondary | ICD-10-CM | POA: Diagnosis not present

## 2018-03-15 DIAGNOSIS — I214 Non-ST elevation (NSTEMI) myocardial infarction: Secondary | ICD-10-CM | POA: Diagnosis not present

## 2018-03-15 DIAGNOSIS — N186 End stage renal disease: Secondary | ICD-10-CM | POA: Diagnosis not present

## 2018-03-15 DIAGNOSIS — E1122 Type 2 diabetes mellitus with diabetic chronic kidney disease: Secondary | ICD-10-CM | POA: Diagnosis not present

## 2018-03-15 DIAGNOSIS — J440 Chronic obstructive pulmonary disease with acute lower respiratory infection: Secondary | ICD-10-CM | POA: Diagnosis not present

## 2018-03-15 DIAGNOSIS — D509 Iron deficiency anemia, unspecified: Secondary | ICD-10-CM | POA: Diagnosis not present

## 2018-03-15 DIAGNOSIS — Z992 Dependence on renal dialysis: Secondary | ICD-10-CM | POA: Diagnosis not present

## 2018-03-16 DIAGNOSIS — J180 Bronchopneumonia, unspecified organism: Secondary | ICD-10-CM | POA: Diagnosis not present

## 2018-03-16 DIAGNOSIS — J440 Chronic obstructive pulmonary disease with acute lower respiratory infection: Secondary | ICD-10-CM | POA: Diagnosis not present

## 2018-03-16 DIAGNOSIS — I214 Non-ST elevation (NSTEMI) myocardial infarction: Secondary | ICD-10-CM | POA: Diagnosis not present

## 2018-03-16 DIAGNOSIS — E1122 Type 2 diabetes mellitus with diabetic chronic kidney disease: Secondary | ICD-10-CM | POA: Diagnosis not present

## 2018-03-16 DIAGNOSIS — I5041 Acute combined systolic (congestive) and diastolic (congestive) heart failure: Secondary | ICD-10-CM | POA: Diagnosis not present

## 2018-03-16 DIAGNOSIS — L89322 Pressure ulcer of left buttock, stage 2: Secondary | ICD-10-CM | POA: Diagnosis not present

## 2018-03-17 DIAGNOSIS — D631 Anemia in chronic kidney disease: Secondary | ICD-10-CM | POA: Diagnosis not present

## 2018-03-17 DIAGNOSIS — Z992 Dependence on renal dialysis: Secondary | ICD-10-CM | POA: Diagnosis not present

## 2018-03-17 DIAGNOSIS — D509 Iron deficiency anemia, unspecified: Secondary | ICD-10-CM | POA: Diagnosis not present

## 2018-03-17 DIAGNOSIS — N186 End stage renal disease: Secondary | ICD-10-CM | POA: Diagnosis not present

## 2018-03-19 DIAGNOSIS — E1122 Type 2 diabetes mellitus with diabetic chronic kidney disease: Secondary | ICD-10-CM | POA: Diagnosis not present

## 2018-03-19 DIAGNOSIS — J440 Chronic obstructive pulmonary disease with acute lower respiratory infection: Secondary | ICD-10-CM | POA: Diagnosis not present

## 2018-03-19 DIAGNOSIS — D631 Anemia in chronic kidney disease: Secondary | ICD-10-CM | POA: Diagnosis not present

## 2018-03-19 DIAGNOSIS — Z992 Dependence on renal dialysis: Secondary | ICD-10-CM | POA: Diagnosis not present

## 2018-03-19 DIAGNOSIS — L89322 Pressure ulcer of left buttock, stage 2: Secondary | ICD-10-CM | POA: Diagnosis not present

## 2018-03-19 DIAGNOSIS — I5041 Acute combined systolic (congestive) and diastolic (congestive) heart failure: Secondary | ICD-10-CM | POA: Diagnosis not present

## 2018-03-19 DIAGNOSIS — I214 Non-ST elevation (NSTEMI) myocardial infarction: Secondary | ICD-10-CM | POA: Diagnosis not present

## 2018-03-19 DIAGNOSIS — D509 Iron deficiency anemia, unspecified: Secondary | ICD-10-CM | POA: Diagnosis not present

## 2018-03-19 DIAGNOSIS — J180 Bronchopneumonia, unspecified organism: Secondary | ICD-10-CM | POA: Diagnosis not present

## 2018-03-19 DIAGNOSIS — N186 End stage renal disease: Secondary | ICD-10-CM | POA: Diagnosis not present

## 2018-03-21 DIAGNOSIS — J180 Bronchopneumonia, unspecified organism: Secondary | ICD-10-CM | POA: Diagnosis not present

## 2018-03-21 DIAGNOSIS — L89322 Pressure ulcer of left buttock, stage 2: Secondary | ICD-10-CM | POA: Diagnosis not present

## 2018-03-21 DIAGNOSIS — I214 Non-ST elevation (NSTEMI) myocardial infarction: Secondary | ICD-10-CM | POA: Diagnosis not present

## 2018-03-21 DIAGNOSIS — I5041 Acute combined systolic (congestive) and diastolic (congestive) heart failure: Secondary | ICD-10-CM | POA: Diagnosis not present

## 2018-03-21 DIAGNOSIS — E1122 Type 2 diabetes mellitus with diabetic chronic kidney disease: Secondary | ICD-10-CM | POA: Diagnosis not present

## 2018-03-21 DIAGNOSIS — J440 Chronic obstructive pulmonary disease with acute lower respiratory infection: Secondary | ICD-10-CM | POA: Diagnosis not present

## 2018-03-22 DIAGNOSIS — D631 Anemia in chronic kidney disease: Secondary | ICD-10-CM | POA: Diagnosis not present

## 2018-03-22 DIAGNOSIS — D509 Iron deficiency anemia, unspecified: Secondary | ICD-10-CM | POA: Diagnosis not present

## 2018-03-22 DIAGNOSIS — N2581 Secondary hyperparathyroidism of renal origin: Secondary | ICD-10-CM | POA: Diagnosis not present

## 2018-03-22 DIAGNOSIS — Z992 Dependence on renal dialysis: Secondary | ICD-10-CM | POA: Diagnosis not present

## 2018-03-22 DIAGNOSIS — N186 End stage renal disease: Secondary | ICD-10-CM | POA: Diagnosis not present

## 2018-03-23 DIAGNOSIS — L89322 Pressure ulcer of left buttock, stage 2: Secondary | ICD-10-CM | POA: Diagnosis not present

## 2018-03-23 DIAGNOSIS — I5041 Acute combined systolic (congestive) and diastolic (congestive) heart failure: Secondary | ICD-10-CM | POA: Diagnosis not present

## 2018-03-23 DIAGNOSIS — J180 Bronchopneumonia, unspecified organism: Secondary | ICD-10-CM | POA: Diagnosis not present

## 2018-03-23 DIAGNOSIS — J440 Chronic obstructive pulmonary disease with acute lower respiratory infection: Secondary | ICD-10-CM | POA: Diagnosis not present

## 2018-03-23 DIAGNOSIS — E1122 Type 2 diabetes mellitus with diabetic chronic kidney disease: Secondary | ICD-10-CM | POA: Diagnosis not present

## 2018-03-23 DIAGNOSIS — I214 Non-ST elevation (NSTEMI) myocardial infarction: Secondary | ICD-10-CM | POA: Diagnosis not present

## 2018-03-24 DIAGNOSIS — N186 End stage renal disease: Secondary | ICD-10-CM | POA: Diagnosis not present

## 2018-03-24 DIAGNOSIS — D631 Anemia in chronic kidney disease: Secondary | ICD-10-CM | POA: Diagnosis not present

## 2018-03-24 DIAGNOSIS — D509 Iron deficiency anemia, unspecified: Secondary | ICD-10-CM | POA: Diagnosis not present

## 2018-03-24 DIAGNOSIS — Z992 Dependence on renal dialysis: Secondary | ICD-10-CM | POA: Diagnosis not present

## 2018-03-24 DIAGNOSIS — N2581 Secondary hyperparathyroidism of renal origin: Secondary | ICD-10-CM | POA: Diagnosis not present

## 2018-03-26 DIAGNOSIS — Z992 Dependence on renal dialysis: Secondary | ICD-10-CM | POA: Diagnosis not present

## 2018-03-26 DIAGNOSIS — D631 Anemia in chronic kidney disease: Secondary | ICD-10-CM | POA: Diagnosis not present

## 2018-03-26 DIAGNOSIS — N2581 Secondary hyperparathyroidism of renal origin: Secondary | ICD-10-CM | POA: Diagnosis not present

## 2018-03-26 DIAGNOSIS — N186 End stage renal disease: Secondary | ICD-10-CM | POA: Diagnosis not present

## 2018-03-26 DIAGNOSIS — D509 Iron deficiency anemia, unspecified: Secondary | ICD-10-CM | POA: Diagnosis not present

## 2018-03-29 DIAGNOSIS — D509 Iron deficiency anemia, unspecified: Secondary | ICD-10-CM | POA: Diagnosis not present

## 2018-03-29 DIAGNOSIS — Z992 Dependence on renal dialysis: Secondary | ICD-10-CM | POA: Diagnosis not present

## 2018-03-29 DIAGNOSIS — N2581 Secondary hyperparathyroidism of renal origin: Secondary | ICD-10-CM | POA: Diagnosis not present

## 2018-03-29 DIAGNOSIS — N186 End stage renal disease: Secondary | ICD-10-CM | POA: Diagnosis not present

## 2018-03-29 DIAGNOSIS — D631 Anemia in chronic kidney disease: Secondary | ICD-10-CM | POA: Diagnosis not present

## 2018-03-30 DIAGNOSIS — J180 Bronchopneumonia, unspecified organism: Secondary | ICD-10-CM | POA: Diagnosis not present

## 2018-03-30 DIAGNOSIS — I214 Non-ST elevation (NSTEMI) myocardial infarction: Secondary | ICD-10-CM | POA: Diagnosis not present

## 2018-03-30 DIAGNOSIS — E1122 Type 2 diabetes mellitus with diabetic chronic kidney disease: Secondary | ICD-10-CM | POA: Diagnosis not present

## 2018-03-30 DIAGNOSIS — J440 Chronic obstructive pulmonary disease with acute lower respiratory infection: Secondary | ICD-10-CM | POA: Diagnosis not present

## 2018-03-30 DIAGNOSIS — I5041 Acute combined systolic (congestive) and diastolic (congestive) heart failure: Secondary | ICD-10-CM | POA: Diagnosis not present

## 2018-03-30 DIAGNOSIS — L89322 Pressure ulcer of left buttock, stage 2: Secondary | ICD-10-CM | POA: Diagnosis not present

## 2018-03-31 DIAGNOSIS — Z992 Dependence on renal dialysis: Secondary | ICD-10-CM | POA: Diagnosis not present

## 2018-03-31 DIAGNOSIS — D631 Anemia in chronic kidney disease: Secondary | ICD-10-CM | POA: Diagnosis not present

## 2018-03-31 DIAGNOSIS — N186 End stage renal disease: Secondary | ICD-10-CM | POA: Diagnosis not present

## 2018-03-31 DIAGNOSIS — D509 Iron deficiency anemia, unspecified: Secondary | ICD-10-CM | POA: Diagnosis not present

## 2018-03-31 DIAGNOSIS — N2581 Secondary hyperparathyroidism of renal origin: Secondary | ICD-10-CM | POA: Diagnosis not present

## 2018-04-02 DIAGNOSIS — D631 Anemia in chronic kidney disease: Secondary | ICD-10-CM | POA: Diagnosis not present

## 2018-04-02 DIAGNOSIS — Z992 Dependence on renal dialysis: Secondary | ICD-10-CM | POA: Diagnosis not present

## 2018-04-02 DIAGNOSIS — N2581 Secondary hyperparathyroidism of renal origin: Secondary | ICD-10-CM | POA: Diagnosis not present

## 2018-04-02 DIAGNOSIS — D509 Iron deficiency anemia, unspecified: Secondary | ICD-10-CM | POA: Diagnosis not present

## 2018-04-02 DIAGNOSIS — N186 End stage renal disease: Secondary | ICD-10-CM | POA: Diagnosis not present

## 2018-04-05 DIAGNOSIS — N2581 Secondary hyperparathyroidism of renal origin: Secondary | ICD-10-CM | POA: Diagnosis not present

## 2018-04-05 DIAGNOSIS — D631 Anemia in chronic kidney disease: Secondary | ICD-10-CM | POA: Diagnosis not present

## 2018-04-05 DIAGNOSIS — D509 Iron deficiency anemia, unspecified: Secondary | ICD-10-CM | POA: Diagnosis not present

## 2018-04-05 DIAGNOSIS — N186 End stage renal disease: Secondary | ICD-10-CM | POA: Diagnosis not present

## 2018-04-05 DIAGNOSIS — Z992 Dependence on renal dialysis: Secondary | ICD-10-CM | POA: Diagnosis not present

## 2018-04-06 DIAGNOSIS — Z452 Encounter for adjustment and management of vascular access device: Secondary | ICD-10-CM | POA: Diagnosis not present

## 2018-04-06 DIAGNOSIS — J440 Chronic obstructive pulmonary disease with acute lower respiratory infection: Secondary | ICD-10-CM | POA: Diagnosis not present

## 2018-04-06 DIAGNOSIS — J180 Bronchopneumonia, unspecified organism: Secondary | ICD-10-CM | POA: Diagnosis not present

## 2018-04-06 DIAGNOSIS — E1122 Type 2 diabetes mellitus with diabetic chronic kidney disease: Secondary | ICD-10-CM | POA: Diagnosis not present

## 2018-04-06 DIAGNOSIS — I5041 Acute combined systolic (congestive) and diastolic (congestive) heart failure: Secondary | ICD-10-CM | POA: Diagnosis not present

## 2018-04-06 DIAGNOSIS — L89322 Pressure ulcer of left buttock, stage 2: Secondary | ICD-10-CM | POA: Diagnosis not present

## 2018-04-06 DIAGNOSIS — I214 Non-ST elevation (NSTEMI) myocardial infarction: Secondary | ICD-10-CM | POA: Diagnosis not present

## 2018-04-08 DIAGNOSIS — D509 Iron deficiency anemia, unspecified: Secondary | ICD-10-CM | POA: Diagnosis not present

## 2018-04-08 DIAGNOSIS — D631 Anemia in chronic kidney disease: Secondary | ICD-10-CM | POA: Diagnosis not present

## 2018-04-08 DIAGNOSIS — N2581 Secondary hyperparathyroidism of renal origin: Secondary | ICD-10-CM | POA: Diagnosis not present

## 2018-04-08 DIAGNOSIS — N186 End stage renal disease: Secondary | ICD-10-CM | POA: Diagnosis not present

## 2018-04-08 DIAGNOSIS — Z992 Dependence on renal dialysis: Secondary | ICD-10-CM | POA: Diagnosis not present

## 2018-04-09 DIAGNOSIS — N2581 Secondary hyperparathyroidism of renal origin: Secondary | ICD-10-CM | POA: Diagnosis not present

## 2018-04-09 DIAGNOSIS — Z992 Dependence on renal dialysis: Secondary | ICD-10-CM | POA: Diagnosis not present

## 2018-04-09 DIAGNOSIS — N186 End stage renal disease: Secondary | ICD-10-CM | POA: Diagnosis not present

## 2018-04-09 DIAGNOSIS — D631 Anemia in chronic kidney disease: Secondary | ICD-10-CM | POA: Diagnosis not present

## 2018-04-09 DIAGNOSIS — D509 Iron deficiency anemia, unspecified: Secondary | ICD-10-CM | POA: Diagnosis not present

## 2018-04-12 DIAGNOSIS — Z992 Dependence on renal dialysis: Secondary | ICD-10-CM | POA: Diagnosis not present

## 2018-04-12 DIAGNOSIS — N2581 Secondary hyperparathyroidism of renal origin: Secondary | ICD-10-CM | POA: Diagnosis not present

## 2018-04-12 DIAGNOSIS — D631 Anemia in chronic kidney disease: Secondary | ICD-10-CM | POA: Diagnosis not present

## 2018-04-12 DIAGNOSIS — N186 End stage renal disease: Secondary | ICD-10-CM | POA: Diagnosis not present

## 2018-04-12 DIAGNOSIS — D509 Iron deficiency anemia, unspecified: Secondary | ICD-10-CM | POA: Diagnosis not present

## 2018-04-13 DIAGNOSIS — M810 Age-related osteoporosis without current pathological fracture: Secondary | ICD-10-CM | POA: Diagnosis not present

## 2018-04-13 DIAGNOSIS — J441 Chronic obstructive pulmonary disease with (acute) exacerbation: Secondary | ICD-10-CM | POA: Diagnosis not present

## 2018-04-13 DIAGNOSIS — L278 Dermatitis due to other substances taken internally: Secondary | ICD-10-CM | POA: Diagnosis not present

## 2018-04-14 DIAGNOSIS — N186 End stage renal disease: Secondary | ICD-10-CM | POA: Diagnosis not present

## 2018-04-14 DIAGNOSIS — D509 Iron deficiency anemia, unspecified: Secondary | ICD-10-CM | POA: Diagnosis not present

## 2018-04-14 DIAGNOSIS — N2581 Secondary hyperparathyroidism of renal origin: Secondary | ICD-10-CM | POA: Diagnosis not present

## 2018-04-14 DIAGNOSIS — D631 Anemia in chronic kidney disease: Secondary | ICD-10-CM | POA: Diagnosis not present

## 2018-04-14 DIAGNOSIS — Z992 Dependence on renal dialysis: Secondary | ICD-10-CM | POA: Diagnosis not present

## 2018-04-15 DIAGNOSIS — J180 Bronchopneumonia, unspecified organism: Secondary | ICD-10-CM | POA: Diagnosis not present

## 2018-04-15 DIAGNOSIS — I214 Non-ST elevation (NSTEMI) myocardial infarction: Secondary | ICD-10-CM | POA: Diagnosis not present

## 2018-04-15 DIAGNOSIS — E1122 Type 2 diabetes mellitus with diabetic chronic kidney disease: Secondary | ICD-10-CM | POA: Diagnosis not present

## 2018-04-15 DIAGNOSIS — L89322 Pressure ulcer of left buttock, stage 2: Secondary | ICD-10-CM | POA: Diagnosis not present

## 2018-04-15 DIAGNOSIS — I5041 Acute combined systolic (congestive) and diastolic (congestive) heart failure: Secondary | ICD-10-CM | POA: Diagnosis not present

## 2018-04-15 DIAGNOSIS — J440 Chronic obstructive pulmonary disease with acute lower respiratory infection: Secondary | ICD-10-CM | POA: Diagnosis not present

## 2018-04-16 DIAGNOSIS — N2581 Secondary hyperparathyroidism of renal origin: Secondary | ICD-10-CM | POA: Diagnosis not present

## 2018-04-16 DIAGNOSIS — N186 End stage renal disease: Secondary | ICD-10-CM | POA: Diagnosis not present

## 2018-04-16 DIAGNOSIS — D631 Anemia in chronic kidney disease: Secondary | ICD-10-CM | POA: Diagnosis not present

## 2018-04-16 DIAGNOSIS — D509 Iron deficiency anemia, unspecified: Secondary | ICD-10-CM | POA: Diagnosis not present

## 2018-04-16 DIAGNOSIS — Z992 Dependence on renal dialysis: Secondary | ICD-10-CM | POA: Diagnosis not present

## 2018-04-18 DIAGNOSIS — Z992 Dependence on renal dialysis: Secondary | ICD-10-CM | POA: Diagnosis not present

## 2018-04-18 DIAGNOSIS — N186 End stage renal disease: Secondary | ICD-10-CM | POA: Diagnosis not present

## 2018-04-19 DIAGNOSIS — D631 Anemia in chronic kidney disease: Secondary | ICD-10-CM | POA: Diagnosis not present

## 2018-04-19 DIAGNOSIS — Z992 Dependence on renal dialysis: Secondary | ICD-10-CM | POA: Diagnosis not present

## 2018-04-19 DIAGNOSIS — D509 Iron deficiency anemia, unspecified: Secondary | ICD-10-CM | POA: Diagnosis not present

## 2018-04-19 DIAGNOSIS — N186 End stage renal disease: Secondary | ICD-10-CM | POA: Diagnosis not present

## 2018-04-20 DIAGNOSIS — E1122 Type 2 diabetes mellitus with diabetic chronic kidney disease: Secondary | ICD-10-CM | POA: Diagnosis not present

## 2018-04-20 DIAGNOSIS — I5041 Acute combined systolic (congestive) and diastolic (congestive) heart failure: Secondary | ICD-10-CM | POA: Diagnosis not present

## 2018-04-20 DIAGNOSIS — L89322 Pressure ulcer of left buttock, stage 2: Secondary | ICD-10-CM | POA: Diagnosis not present

## 2018-04-20 DIAGNOSIS — I214 Non-ST elevation (NSTEMI) myocardial infarction: Secondary | ICD-10-CM | POA: Diagnosis not present

## 2018-04-20 DIAGNOSIS — J180 Bronchopneumonia, unspecified organism: Secondary | ICD-10-CM | POA: Diagnosis not present

## 2018-04-20 DIAGNOSIS — J440 Chronic obstructive pulmonary disease with acute lower respiratory infection: Secondary | ICD-10-CM | POA: Diagnosis not present

## 2018-04-21 DIAGNOSIS — N186 End stage renal disease: Secondary | ICD-10-CM | POA: Diagnosis not present

## 2018-04-21 DIAGNOSIS — Z992 Dependence on renal dialysis: Secondary | ICD-10-CM | POA: Diagnosis not present

## 2018-04-21 DIAGNOSIS — D631 Anemia in chronic kidney disease: Secondary | ICD-10-CM | POA: Diagnosis not present

## 2018-04-21 DIAGNOSIS — D509 Iron deficiency anemia, unspecified: Secondary | ICD-10-CM | POA: Diagnosis not present

## 2018-04-23 DIAGNOSIS — N186 End stage renal disease: Secondary | ICD-10-CM | POA: Diagnosis not present

## 2018-04-23 DIAGNOSIS — D631 Anemia in chronic kidney disease: Secondary | ICD-10-CM | POA: Diagnosis not present

## 2018-04-23 DIAGNOSIS — D509 Iron deficiency anemia, unspecified: Secondary | ICD-10-CM | POA: Diagnosis not present

## 2018-04-23 DIAGNOSIS — Z992 Dependence on renal dialysis: Secondary | ICD-10-CM | POA: Diagnosis not present

## 2018-04-26 DIAGNOSIS — Z992 Dependence on renal dialysis: Secondary | ICD-10-CM | POA: Diagnosis not present

## 2018-04-26 DIAGNOSIS — N186 End stage renal disease: Secondary | ICD-10-CM | POA: Diagnosis not present

## 2018-04-26 DIAGNOSIS — D631 Anemia in chronic kidney disease: Secondary | ICD-10-CM | POA: Diagnosis not present

## 2018-04-26 DIAGNOSIS — D509 Iron deficiency anemia, unspecified: Secondary | ICD-10-CM | POA: Diagnosis not present

## 2018-04-27 DIAGNOSIS — I214 Non-ST elevation (NSTEMI) myocardial infarction: Secondary | ICD-10-CM | POA: Diagnosis not present

## 2018-04-27 DIAGNOSIS — I5041 Acute combined systolic (congestive) and diastolic (congestive) heart failure: Secondary | ICD-10-CM | POA: Diagnosis not present

## 2018-04-27 DIAGNOSIS — J440 Chronic obstructive pulmonary disease with acute lower respiratory infection: Secondary | ICD-10-CM | POA: Diagnosis not present

## 2018-04-27 DIAGNOSIS — J180 Bronchopneumonia, unspecified organism: Secondary | ICD-10-CM | POA: Diagnosis not present

## 2018-04-27 DIAGNOSIS — L89322 Pressure ulcer of left buttock, stage 2: Secondary | ICD-10-CM | POA: Diagnosis not present

## 2018-04-27 DIAGNOSIS — E1122 Type 2 diabetes mellitus with diabetic chronic kidney disease: Secondary | ICD-10-CM | POA: Diagnosis not present

## 2018-04-28 DIAGNOSIS — Z992 Dependence on renal dialysis: Secondary | ICD-10-CM | POA: Diagnosis not present

## 2018-04-28 DIAGNOSIS — D631 Anemia in chronic kidney disease: Secondary | ICD-10-CM | POA: Diagnosis not present

## 2018-04-28 DIAGNOSIS — N186 End stage renal disease: Secondary | ICD-10-CM | POA: Diagnosis not present

## 2018-04-28 DIAGNOSIS — D509 Iron deficiency anemia, unspecified: Secondary | ICD-10-CM | POA: Diagnosis not present

## 2018-04-29 DIAGNOSIS — J440 Chronic obstructive pulmonary disease with acute lower respiratory infection: Secondary | ICD-10-CM | POA: Diagnosis not present

## 2018-04-29 DIAGNOSIS — I214 Non-ST elevation (NSTEMI) myocardial infarction: Secondary | ICD-10-CM | POA: Diagnosis not present

## 2018-04-29 DIAGNOSIS — J180 Bronchopneumonia, unspecified organism: Secondary | ICD-10-CM | POA: Diagnosis not present

## 2018-04-29 DIAGNOSIS — L89322 Pressure ulcer of left buttock, stage 2: Secondary | ICD-10-CM | POA: Diagnosis not present

## 2018-04-29 DIAGNOSIS — I5041 Acute combined systolic (congestive) and diastolic (congestive) heart failure: Secondary | ICD-10-CM | POA: Diagnosis not present

## 2018-04-29 DIAGNOSIS — E1122 Type 2 diabetes mellitus with diabetic chronic kidney disease: Secondary | ICD-10-CM | POA: Diagnosis not present

## 2018-04-30 DIAGNOSIS — N186 End stage renal disease: Secondary | ICD-10-CM | POA: Diagnosis not present

## 2018-04-30 DIAGNOSIS — D509 Iron deficiency anemia, unspecified: Secondary | ICD-10-CM | POA: Diagnosis not present

## 2018-04-30 DIAGNOSIS — D631 Anemia in chronic kidney disease: Secondary | ICD-10-CM | POA: Diagnosis not present

## 2018-04-30 DIAGNOSIS — Z992 Dependence on renal dialysis: Secondary | ICD-10-CM | POA: Diagnosis not present

## 2018-05-03 DIAGNOSIS — N186 End stage renal disease: Secondary | ICD-10-CM | POA: Diagnosis not present

## 2018-05-03 DIAGNOSIS — D509 Iron deficiency anemia, unspecified: Secondary | ICD-10-CM | POA: Diagnosis not present

## 2018-05-03 DIAGNOSIS — Z992 Dependence on renal dialysis: Secondary | ICD-10-CM | POA: Diagnosis not present

## 2018-05-03 DIAGNOSIS — D631 Anemia in chronic kidney disease: Secondary | ICD-10-CM | POA: Diagnosis not present

## 2018-05-05 DIAGNOSIS — D631 Anemia in chronic kidney disease: Secondary | ICD-10-CM | POA: Diagnosis not present

## 2018-05-05 DIAGNOSIS — Z992 Dependence on renal dialysis: Secondary | ICD-10-CM | POA: Diagnosis not present

## 2018-05-05 DIAGNOSIS — D509 Iron deficiency anemia, unspecified: Secondary | ICD-10-CM | POA: Diagnosis not present

## 2018-05-05 DIAGNOSIS — N186 End stage renal disease: Secondary | ICD-10-CM | POA: Diagnosis not present

## 2018-05-06 DIAGNOSIS — R0602 Shortness of breath: Secondary | ICD-10-CM | POA: Diagnosis not present

## 2018-05-06 DIAGNOSIS — R7989 Other specified abnormal findings of blood chemistry: Secondary | ICD-10-CM | POA: Diagnosis not present

## 2018-05-06 DIAGNOSIS — J441 Chronic obstructive pulmonary disease with (acute) exacerbation: Secondary | ICD-10-CM | POA: Diagnosis not present

## 2018-05-06 DIAGNOSIS — N186 End stage renal disease: Secondary | ICD-10-CM | POA: Diagnosis not present

## 2018-05-06 DIAGNOSIS — J90 Pleural effusion, not elsewhere classified: Secondary | ICD-10-CM | POA: Diagnosis not present

## 2018-05-06 DIAGNOSIS — I5043 Acute on chronic combined systolic (congestive) and diastolic (congestive) heart failure: Secondary | ICD-10-CM | POA: Diagnosis not present

## 2018-05-06 DIAGNOSIS — E871 Hypo-osmolality and hyponatremia: Secondary | ICD-10-CM | POA: Diagnosis not present

## 2018-05-06 DIAGNOSIS — I132 Hypertensive heart and chronic kidney disease with heart failure and with stage 5 chronic kidney disease, or end stage renal disease: Secondary | ICD-10-CM | POA: Diagnosis not present

## 2018-05-06 DIAGNOSIS — I214 Non-ST elevation (NSTEMI) myocardial infarction: Secondary | ICD-10-CM | POA: Diagnosis not present

## 2018-05-06 DIAGNOSIS — I959 Hypotension, unspecified: Secondary | ICD-10-CM | POA: Diagnosis not present

## 2018-05-07 DIAGNOSIS — D631 Anemia in chronic kidney disease: Secondary | ICD-10-CM | POA: Diagnosis not present

## 2018-05-07 DIAGNOSIS — E875 Hyperkalemia: Secondary | ICD-10-CM | POA: Diagnosis not present

## 2018-05-07 DIAGNOSIS — I447 Left bundle-branch block, unspecified: Secondary | ICD-10-CM | POA: Diagnosis present

## 2018-05-07 DIAGNOSIS — I132 Hypertensive heart and chronic kidney disease with heart failure and with stage 5 chronic kidney disease, or end stage renal disease: Secondary | ICD-10-CM | POA: Diagnosis present

## 2018-05-07 DIAGNOSIS — D649 Anemia, unspecified: Secondary | ICD-10-CM | POA: Diagnosis present

## 2018-05-07 DIAGNOSIS — R0602 Shortness of breath: Secondary | ICD-10-CM | POA: Diagnosis not present

## 2018-05-07 DIAGNOSIS — Z7901 Long term (current) use of anticoagulants: Secondary | ICD-10-CM | POA: Diagnosis not present

## 2018-05-07 DIAGNOSIS — I214 Non-ST elevation (NSTEMI) myocardial infarction: Secondary | ICD-10-CM | POA: Diagnosis present

## 2018-05-07 DIAGNOSIS — E1122 Type 2 diabetes mellitus with diabetic chronic kidney disease: Secondary | ICD-10-CM | POA: Diagnosis present

## 2018-05-07 DIAGNOSIS — R7989 Other specified abnormal findings of blood chemistry: Secondary | ICD-10-CM | POA: Diagnosis not present

## 2018-05-07 DIAGNOSIS — F329 Major depressive disorder, single episode, unspecified: Secondary | ICD-10-CM | POA: Diagnosis present

## 2018-05-07 DIAGNOSIS — J90 Pleural effusion, not elsewhere classified: Secondary | ICD-10-CM | POA: Diagnosis not present

## 2018-05-07 DIAGNOSIS — F17220 Nicotine dependence, chewing tobacco, uncomplicated: Secondary | ICD-10-CM | POA: Diagnosis present

## 2018-05-07 DIAGNOSIS — Z89512 Acquired absence of left leg below knee: Secondary | ICD-10-CM | POA: Diagnosis not present

## 2018-05-07 DIAGNOSIS — I081 Rheumatic disorders of both mitral and tricuspid valves: Secondary | ICD-10-CM | POA: Diagnosis present

## 2018-05-07 DIAGNOSIS — E871 Hypo-osmolality and hyponatremia: Secondary | ICD-10-CM | POA: Diagnosis not present

## 2018-05-07 DIAGNOSIS — E890 Postprocedural hypothyroidism: Secondary | ICD-10-CM | POA: Diagnosis present

## 2018-05-07 DIAGNOSIS — Z952 Presence of prosthetic heart valve: Secondary | ICD-10-CM | POA: Diagnosis not present

## 2018-05-07 DIAGNOSIS — E271 Primary adrenocortical insufficiency: Secondary | ICD-10-CM | POA: Diagnosis present

## 2018-05-07 DIAGNOSIS — F419 Anxiety disorder, unspecified: Secondary | ICD-10-CM | POA: Diagnosis present

## 2018-05-07 DIAGNOSIS — Z992 Dependence on renal dialysis: Secondary | ICD-10-CM | POA: Diagnosis not present

## 2018-05-07 DIAGNOSIS — J441 Chronic obstructive pulmonary disease with (acute) exacerbation: Secondary | ICD-10-CM | POA: Diagnosis not present

## 2018-05-07 DIAGNOSIS — N186 End stage renal disease: Secondary | ICD-10-CM | POA: Diagnosis present

## 2018-05-07 DIAGNOSIS — I2510 Atherosclerotic heart disease of native coronary artery without angina pectoris: Secondary | ICD-10-CM | POA: Diagnosis present

## 2018-05-07 DIAGNOSIS — Z89511 Acquired absence of right leg below knee: Secondary | ICD-10-CM | POA: Diagnosis not present

## 2018-05-07 DIAGNOSIS — E1151 Type 2 diabetes mellitus with diabetic peripheral angiopathy without gangrene: Secondary | ICD-10-CM | POA: Diagnosis present

## 2018-05-07 DIAGNOSIS — I959 Hypotension, unspecified: Secondary | ICD-10-CM | POA: Diagnosis not present

## 2018-05-07 DIAGNOSIS — G8929 Other chronic pain: Secondary | ICD-10-CM | POA: Diagnosis present

## 2018-05-07 DIAGNOSIS — I5043 Acute on chronic combined systolic (congestive) and diastolic (congestive) heart failure: Secondary | ICD-10-CM | POA: Diagnosis present

## 2018-05-10 DIAGNOSIS — N186 End stage renal disease: Secondary | ICD-10-CM | POA: Diagnosis not present

## 2018-05-10 DIAGNOSIS — Z992 Dependence on renal dialysis: Secondary | ICD-10-CM | POA: Diagnosis not present

## 2018-05-10 DIAGNOSIS — D631 Anemia in chronic kidney disease: Secondary | ICD-10-CM | POA: Diagnosis not present

## 2018-05-10 DIAGNOSIS — D509 Iron deficiency anemia, unspecified: Secondary | ICD-10-CM | POA: Diagnosis not present

## 2018-05-12 DIAGNOSIS — Z992 Dependence on renal dialysis: Secondary | ICD-10-CM | POA: Diagnosis not present

## 2018-05-12 DIAGNOSIS — N186 End stage renal disease: Secondary | ICD-10-CM | POA: Diagnosis not present

## 2018-05-12 DIAGNOSIS — D509 Iron deficiency anemia, unspecified: Secondary | ICD-10-CM | POA: Diagnosis not present

## 2018-05-12 DIAGNOSIS — D631 Anemia in chronic kidney disease: Secondary | ICD-10-CM | POA: Diagnosis not present

## 2018-05-14 DIAGNOSIS — N186 End stage renal disease: Secondary | ICD-10-CM | POA: Diagnosis not present

## 2018-05-14 DIAGNOSIS — D509 Iron deficiency anemia, unspecified: Secondary | ICD-10-CM | POA: Diagnosis not present

## 2018-05-14 DIAGNOSIS — D631 Anemia in chronic kidney disease: Secondary | ICD-10-CM | POA: Diagnosis not present

## 2018-05-14 DIAGNOSIS — Z992 Dependence on renal dialysis: Secondary | ICD-10-CM | POA: Diagnosis not present

## 2018-05-17 DIAGNOSIS — N186 End stage renal disease: Secondary | ICD-10-CM | POA: Diagnosis not present

## 2018-05-17 DIAGNOSIS — Z992 Dependence on renal dialysis: Secondary | ICD-10-CM | POA: Diagnosis not present

## 2018-05-17 DIAGNOSIS — D509 Iron deficiency anemia, unspecified: Secondary | ICD-10-CM | POA: Diagnosis not present

## 2018-05-17 DIAGNOSIS — D631 Anemia in chronic kidney disease: Secondary | ICD-10-CM | POA: Diagnosis not present

## 2018-05-19 DIAGNOSIS — D631 Anemia in chronic kidney disease: Secondary | ICD-10-CM | POA: Diagnosis not present

## 2018-05-19 DIAGNOSIS — Z992 Dependence on renal dialysis: Secondary | ICD-10-CM | POA: Diagnosis not present

## 2018-05-19 DIAGNOSIS — N186 End stage renal disease: Secondary | ICD-10-CM | POA: Diagnosis not present

## 2018-05-19 DIAGNOSIS — D509 Iron deficiency anemia, unspecified: Secondary | ICD-10-CM | POA: Diagnosis not present

## 2018-05-21 DIAGNOSIS — N186 End stage renal disease: Secondary | ICD-10-CM | POA: Diagnosis not present

## 2018-05-21 DIAGNOSIS — Z992 Dependence on renal dialysis: Secondary | ICD-10-CM | POA: Diagnosis not present

## 2018-05-21 DIAGNOSIS — D509 Iron deficiency anemia, unspecified: Secondary | ICD-10-CM | POA: Diagnosis not present

## 2018-05-21 DIAGNOSIS — D631 Anemia in chronic kidney disease: Secondary | ICD-10-CM | POA: Diagnosis not present

## 2018-05-24 DIAGNOSIS — F329 Major depressive disorder, single episode, unspecified: Secondary | ICD-10-CM | POA: Diagnosis not present

## 2018-05-24 DIAGNOSIS — Z89512 Acquired absence of left leg below knee: Secondary | ICD-10-CM | POA: Diagnosis not present

## 2018-05-24 DIAGNOSIS — E039 Hypothyroidism, unspecified: Secondary | ICD-10-CM | POA: Diagnosis not present

## 2018-05-24 DIAGNOSIS — R42 Dizziness and giddiness: Secondary | ICD-10-CM | POA: Diagnosis not present

## 2018-05-24 DIAGNOSIS — F419 Anxiety disorder, unspecified: Secondary | ICD-10-CM | POA: Diagnosis not present

## 2018-05-24 DIAGNOSIS — Z905 Acquired absence of kidney: Secondary | ICD-10-CM | POA: Diagnosis not present

## 2018-05-24 DIAGNOSIS — Z87891 Personal history of nicotine dependence: Secondary | ICD-10-CM | POA: Diagnosis not present

## 2018-05-24 DIAGNOSIS — I959 Hypotension, unspecified: Secondary | ICD-10-CM | POA: Diagnosis not present

## 2018-05-24 DIAGNOSIS — R0902 Hypoxemia: Secondary | ICD-10-CM | POA: Diagnosis not present

## 2018-05-24 DIAGNOSIS — Z79899 Other long term (current) drug therapy: Secondary | ICD-10-CM | POA: Diagnosis not present

## 2018-05-24 DIAGNOSIS — Z7951 Long term (current) use of inhaled steroids: Secondary | ICD-10-CM | POA: Diagnosis not present

## 2018-05-24 DIAGNOSIS — Z992 Dependence on renal dialysis: Secondary | ICD-10-CM | POA: Diagnosis not present

## 2018-05-24 DIAGNOSIS — E271 Primary adrenocortical insufficiency: Secondary | ICD-10-CM | POA: Diagnosis not present

## 2018-05-24 DIAGNOSIS — Z7901 Long term (current) use of anticoagulants: Secondary | ICD-10-CM | POA: Diagnosis not present

## 2018-05-24 DIAGNOSIS — Z950 Presence of cardiac pacemaker: Secondary | ICD-10-CM | POA: Diagnosis not present

## 2018-05-24 DIAGNOSIS — I120 Hypertensive chronic kidney disease with stage 5 chronic kidney disease or end stage renal disease: Secondary | ICD-10-CM | POA: Diagnosis not present

## 2018-05-24 DIAGNOSIS — D631 Anemia in chronic kidney disease: Secondary | ICD-10-CM | POA: Diagnosis not present

## 2018-05-24 DIAGNOSIS — D509 Iron deficiency anemia, unspecified: Secondary | ICD-10-CM | POA: Diagnosis not present

## 2018-05-24 DIAGNOSIS — R0602 Shortness of breath: Secondary | ICD-10-CM | POA: Diagnosis not present

## 2018-05-24 DIAGNOSIS — I10 Essential (primary) hypertension: Secondary | ICD-10-CM | POA: Diagnosis not present

## 2018-05-24 DIAGNOSIS — N186 End stage renal disease: Secondary | ICD-10-CM | POA: Diagnosis not present

## 2018-05-24 DIAGNOSIS — Z89511 Acquired absence of right leg below knee: Secondary | ICD-10-CM | POA: Diagnosis not present

## 2018-05-25 DIAGNOSIS — J441 Chronic obstructive pulmonary disease with (acute) exacerbation: Secondary | ICD-10-CM | POA: Diagnosis not present

## 2018-05-25 DIAGNOSIS — M810 Age-related osteoporosis without current pathological fracture: Secondary | ICD-10-CM | POA: Diagnosis not present

## 2018-05-25 DIAGNOSIS — Z7901 Long term (current) use of anticoagulants: Secondary | ICD-10-CM | POA: Diagnosis not present

## 2018-05-25 DIAGNOSIS — I5032 Chronic diastolic (congestive) heart failure: Secondary | ICD-10-CM | POA: Diagnosis not present

## 2018-05-25 DIAGNOSIS — I951 Orthostatic hypotension: Secondary | ICD-10-CM | POA: Diagnosis not present

## 2018-05-26 DIAGNOSIS — D631 Anemia in chronic kidney disease: Secondary | ICD-10-CM | POA: Diagnosis not present

## 2018-05-26 DIAGNOSIS — Z992 Dependence on renal dialysis: Secondary | ICD-10-CM | POA: Diagnosis not present

## 2018-05-26 DIAGNOSIS — N186 End stage renal disease: Secondary | ICD-10-CM | POA: Diagnosis not present

## 2018-05-26 DIAGNOSIS — D509 Iron deficiency anemia, unspecified: Secondary | ICD-10-CM | POA: Diagnosis not present

## 2018-05-28 DIAGNOSIS — D509 Iron deficiency anemia, unspecified: Secondary | ICD-10-CM | POA: Diagnosis not present

## 2018-05-28 DIAGNOSIS — N186 End stage renal disease: Secondary | ICD-10-CM | POA: Diagnosis not present

## 2018-05-28 DIAGNOSIS — Z992 Dependence on renal dialysis: Secondary | ICD-10-CM | POA: Diagnosis not present

## 2018-05-28 DIAGNOSIS — D631 Anemia in chronic kidney disease: Secondary | ICD-10-CM | POA: Diagnosis not present

## 2018-05-31 DIAGNOSIS — D631 Anemia in chronic kidney disease: Secondary | ICD-10-CM | POA: Diagnosis not present

## 2018-05-31 DIAGNOSIS — N186 End stage renal disease: Secondary | ICD-10-CM | POA: Diagnosis not present

## 2018-05-31 DIAGNOSIS — Z992 Dependence on renal dialysis: Secondary | ICD-10-CM | POA: Diagnosis not present

## 2018-05-31 DIAGNOSIS — D509 Iron deficiency anemia, unspecified: Secondary | ICD-10-CM | POA: Diagnosis not present

## 2018-06-01 DIAGNOSIS — J441 Chronic obstructive pulmonary disease with (acute) exacerbation: Secondary | ICD-10-CM | POA: Diagnosis not present

## 2018-06-01 DIAGNOSIS — I951 Orthostatic hypotension: Secondary | ICD-10-CM | POA: Diagnosis not present

## 2018-06-01 DIAGNOSIS — M810 Age-related osteoporosis without current pathological fracture: Secondary | ICD-10-CM | POA: Diagnosis not present

## 2018-06-01 DIAGNOSIS — I5032 Chronic diastolic (congestive) heart failure: Secondary | ICD-10-CM | POA: Diagnosis not present

## 2018-06-01 DIAGNOSIS — Z7901 Long term (current) use of anticoagulants: Secondary | ICD-10-CM | POA: Diagnosis not present

## 2018-06-02 DIAGNOSIS — N186 End stage renal disease: Secondary | ICD-10-CM | POA: Diagnosis not present

## 2018-06-02 DIAGNOSIS — D631 Anemia in chronic kidney disease: Secondary | ICD-10-CM | POA: Diagnosis not present

## 2018-06-02 DIAGNOSIS — D509 Iron deficiency anemia, unspecified: Secondary | ICD-10-CM | POA: Diagnosis not present

## 2018-06-02 DIAGNOSIS — R008 Other abnormalities of heart beat: Secondary | ICD-10-CM | POA: Diagnosis not present

## 2018-06-02 DIAGNOSIS — Z992 Dependence on renal dialysis: Secondary | ICD-10-CM | POA: Diagnosis not present

## 2018-06-04 DIAGNOSIS — N186 End stage renal disease: Secondary | ICD-10-CM | POA: Diagnosis not present

## 2018-06-04 DIAGNOSIS — D631 Anemia in chronic kidney disease: Secondary | ICD-10-CM | POA: Diagnosis not present

## 2018-06-04 DIAGNOSIS — D509 Iron deficiency anemia, unspecified: Secondary | ICD-10-CM | POA: Diagnosis not present

## 2018-06-04 DIAGNOSIS — Z992 Dependence on renal dialysis: Secondary | ICD-10-CM | POA: Diagnosis not present

## 2018-06-07 DIAGNOSIS — N186 End stage renal disease: Secondary | ICD-10-CM | POA: Diagnosis not present

## 2018-06-07 DIAGNOSIS — D509 Iron deficiency anemia, unspecified: Secondary | ICD-10-CM | POA: Diagnosis not present

## 2018-06-07 DIAGNOSIS — Z992 Dependence on renal dialysis: Secondary | ICD-10-CM | POA: Diagnosis not present

## 2018-06-07 DIAGNOSIS — D631 Anemia in chronic kidney disease: Secondary | ICD-10-CM | POA: Diagnosis not present

## 2018-06-09 DIAGNOSIS — Z992 Dependence on renal dialysis: Secondary | ICD-10-CM | POA: Diagnosis not present

## 2018-06-09 DIAGNOSIS — D509 Iron deficiency anemia, unspecified: Secondary | ICD-10-CM | POA: Diagnosis not present

## 2018-06-09 DIAGNOSIS — D631 Anemia in chronic kidney disease: Secondary | ICD-10-CM | POA: Diagnosis not present

## 2018-06-09 DIAGNOSIS — N186 End stage renal disease: Secondary | ICD-10-CM | POA: Diagnosis not present

## 2018-06-11 DIAGNOSIS — D509 Iron deficiency anemia, unspecified: Secondary | ICD-10-CM | POA: Diagnosis not present

## 2018-06-11 DIAGNOSIS — D631 Anemia in chronic kidney disease: Secondary | ICD-10-CM | POA: Diagnosis not present

## 2018-06-11 DIAGNOSIS — Z992 Dependence on renal dialysis: Secondary | ICD-10-CM | POA: Diagnosis not present

## 2018-06-11 DIAGNOSIS — N186 End stage renal disease: Secondary | ICD-10-CM | POA: Diagnosis not present

## 2018-06-14 DIAGNOSIS — N186 End stage renal disease: Secondary | ICD-10-CM | POA: Diagnosis not present

## 2018-06-14 DIAGNOSIS — Z992 Dependence on renal dialysis: Secondary | ICD-10-CM | POA: Diagnosis not present

## 2018-06-14 DIAGNOSIS — D631 Anemia in chronic kidney disease: Secondary | ICD-10-CM | POA: Diagnosis not present

## 2018-06-14 DIAGNOSIS — D509 Iron deficiency anemia, unspecified: Secondary | ICD-10-CM | POA: Diagnosis not present

## 2018-06-16 DIAGNOSIS — Z992 Dependence on renal dialysis: Secondary | ICD-10-CM | POA: Diagnosis not present

## 2018-06-16 DIAGNOSIS — D631 Anemia in chronic kidney disease: Secondary | ICD-10-CM | POA: Diagnosis not present

## 2018-06-16 DIAGNOSIS — N186 End stage renal disease: Secondary | ICD-10-CM | POA: Diagnosis not present

## 2018-06-16 DIAGNOSIS — D509 Iron deficiency anemia, unspecified: Secondary | ICD-10-CM | POA: Diagnosis not present

## 2018-06-17 DIAGNOSIS — Z7901 Long term (current) use of anticoagulants: Secondary | ICD-10-CM | POA: Diagnosis not present

## 2018-06-18 DIAGNOSIS — D631 Anemia in chronic kidney disease: Secondary | ICD-10-CM | POA: Diagnosis not present

## 2018-06-18 DIAGNOSIS — Z992 Dependence on renal dialysis: Secondary | ICD-10-CM | POA: Diagnosis not present

## 2018-06-18 DIAGNOSIS — D509 Iron deficiency anemia, unspecified: Secondary | ICD-10-CM | POA: Diagnosis not present

## 2018-06-18 DIAGNOSIS — N186 End stage renal disease: Secondary | ICD-10-CM | POA: Diagnosis not present

## 2018-06-21 DIAGNOSIS — Z992 Dependence on renal dialysis: Secondary | ICD-10-CM | POA: Diagnosis not present

## 2018-06-21 DIAGNOSIS — D509 Iron deficiency anemia, unspecified: Secondary | ICD-10-CM | POA: Diagnosis not present

## 2018-06-21 DIAGNOSIS — N2581 Secondary hyperparathyroidism of renal origin: Secondary | ICD-10-CM | POA: Diagnosis not present

## 2018-06-21 DIAGNOSIS — N186 End stage renal disease: Secondary | ICD-10-CM | POA: Diagnosis not present

## 2018-06-21 DIAGNOSIS — D631 Anemia in chronic kidney disease: Secondary | ICD-10-CM | POA: Diagnosis not present

## 2018-06-23 DIAGNOSIS — Z992 Dependence on renal dialysis: Secondary | ICD-10-CM | POA: Diagnosis not present

## 2018-06-23 DIAGNOSIS — N2581 Secondary hyperparathyroidism of renal origin: Secondary | ICD-10-CM | POA: Diagnosis not present

## 2018-06-23 DIAGNOSIS — D631 Anemia in chronic kidney disease: Secondary | ICD-10-CM | POA: Diagnosis not present

## 2018-06-23 DIAGNOSIS — D509 Iron deficiency anemia, unspecified: Secondary | ICD-10-CM | POA: Diagnosis not present

## 2018-06-23 DIAGNOSIS — N186 End stage renal disease: Secondary | ICD-10-CM | POA: Diagnosis not present

## 2018-06-24 DIAGNOSIS — Z452 Encounter for adjustment and management of vascular access device: Secondary | ICD-10-CM | POA: Diagnosis not present

## 2018-06-25 DIAGNOSIS — D509 Iron deficiency anemia, unspecified: Secondary | ICD-10-CM | POA: Diagnosis not present

## 2018-06-25 DIAGNOSIS — Z992 Dependence on renal dialysis: Secondary | ICD-10-CM | POA: Diagnosis not present

## 2018-06-25 DIAGNOSIS — D631 Anemia in chronic kidney disease: Secondary | ICD-10-CM | POA: Diagnosis not present

## 2018-06-25 DIAGNOSIS — N2581 Secondary hyperparathyroidism of renal origin: Secondary | ICD-10-CM | POA: Diagnosis not present

## 2018-06-25 DIAGNOSIS — N186 End stage renal disease: Secondary | ICD-10-CM | POA: Diagnosis not present

## 2018-06-28 DIAGNOSIS — N186 End stage renal disease: Secondary | ICD-10-CM | POA: Diagnosis not present

## 2018-06-28 DIAGNOSIS — D631 Anemia in chronic kidney disease: Secondary | ICD-10-CM | POA: Diagnosis not present

## 2018-06-28 DIAGNOSIS — Z992 Dependence on renal dialysis: Secondary | ICD-10-CM | POA: Diagnosis not present

## 2018-06-28 DIAGNOSIS — N2581 Secondary hyperparathyroidism of renal origin: Secondary | ICD-10-CM | POA: Diagnosis not present

## 2018-06-28 DIAGNOSIS — D509 Iron deficiency anemia, unspecified: Secondary | ICD-10-CM | POA: Diagnosis not present

## 2018-06-30 DIAGNOSIS — Z992 Dependence on renal dialysis: Secondary | ICD-10-CM | POA: Diagnosis not present

## 2018-06-30 DIAGNOSIS — N2581 Secondary hyperparathyroidism of renal origin: Secondary | ICD-10-CM | POA: Diagnosis not present

## 2018-06-30 DIAGNOSIS — D509 Iron deficiency anemia, unspecified: Secondary | ICD-10-CM | POA: Diagnosis not present

## 2018-06-30 DIAGNOSIS — N186 End stage renal disease: Secondary | ICD-10-CM | POA: Diagnosis not present

## 2018-06-30 DIAGNOSIS — D631 Anemia in chronic kidney disease: Secondary | ICD-10-CM | POA: Diagnosis not present

## 2018-07-03 DIAGNOSIS — D631 Anemia in chronic kidney disease: Secondary | ICD-10-CM | POA: Diagnosis not present

## 2018-07-03 DIAGNOSIS — N2581 Secondary hyperparathyroidism of renal origin: Secondary | ICD-10-CM | POA: Diagnosis not present

## 2018-07-03 DIAGNOSIS — D509 Iron deficiency anemia, unspecified: Secondary | ICD-10-CM | POA: Diagnosis not present

## 2018-07-03 DIAGNOSIS — N186 End stage renal disease: Secondary | ICD-10-CM | POA: Diagnosis not present

## 2018-07-03 DIAGNOSIS — Z992 Dependence on renal dialysis: Secondary | ICD-10-CM | POA: Diagnosis not present

## 2018-07-05 DIAGNOSIS — N186 End stage renal disease: Secondary | ICD-10-CM | POA: Diagnosis not present

## 2018-07-05 DIAGNOSIS — N2581 Secondary hyperparathyroidism of renal origin: Secondary | ICD-10-CM | POA: Diagnosis not present

## 2018-07-05 DIAGNOSIS — D631 Anemia in chronic kidney disease: Secondary | ICD-10-CM | POA: Diagnosis not present

## 2018-07-05 DIAGNOSIS — D509 Iron deficiency anemia, unspecified: Secondary | ICD-10-CM | POA: Diagnosis not present

## 2018-07-05 DIAGNOSIS — Z992 Dependence on renal dialysis: Secondary | ICD-10-CM | POA: Diagnosis not present

## 2018-07-06 DIAGNOSIS — Z7901 Long term (current) use of anticoagulants: Secondary | ICD-10-CM | POA: Diagnosis not present

## 2018-07-07 DIAGNOSIS — N2581 Secondary hyperparathyroidism of renal origin: Secondary | ICD-10-CM | POA: Diagnosis not present

## 2018-07-07 DIAGNOSIS — D509 Iron deficiency anemia, unspecified: Secondary | ICD-10-CM | POA: Diagnosis not present

## 2018-07-07 DIAGNOSIS — N186 End stage renal disease: Secondary | ICD-10-CM | POA: Diagnosis not present

## 2018-07-07 DIAGNOSIS — Z992 Dependence on renal dialysis: Secondary | ICD-10-CM | POA: Diagnosis not present

## 2018-07-07 DIAGNOSIS — D631 Anemia in chronic kidney disease: Secondary | ICD-10-CM | POA: Diagnosis not present

## 2018-07-09 DIAGNOSIS — N2581 Secondary hyperparathyroidism of renal origin: Secondary | ICD-10-CM | POA: Diagnosis not present

## 2018-07-09 DIAGNOSIS — D631 Anemia in chronic kidney disease: Secondary | ICD-10-CM | POA: Diagnosis not present

## 2018-07-09 DIAGNOSIS — D509 Iron deficiency anemia, unspecified: Secondary | ICD-10-CM | POA: Diagnosis not present

## 2018-07-09 DIAGNOSIS — Z992 Dependence on renal dialysis: Secondary | ICD-10-CM | POA: Diagnosis not present

## 2018-07-09 DIAGNOSIS — N186 End stage renal disease: Secondary | ICD-10-CM | POA: Diagnosis not present

## 2018-07-12 DIAGNOSIS — N186 End stage renal disease: Secondary | ICD-10-CM | POA: Diagnosis not present

## 2018-07-12 DIAGNOSIS — D509 Iron deficiency anemia, unspecified: Secondary | ICD-10-CM | POA: Diagnosis not present

## 2018-07-12 DIAGNOSIS — N2581 Secondary hyperparathyroidism of renal origin: Secondary | ICD-10-CM | POA: Diagnosis not present

## 2018-07-12 DIAGNOSIS — D631 Anemia in chronic kidney disease: Secondary | ICD-10-CM | POA: Diagnosis not present

## 2018-07-12 DIAGNOSIS — Z992 Dependence on renal dialysis: Secondary | ICD-10-CM | POA: Diagnosis not present

## 2018-07-14 DIAGNOSIS — N186 End stage renal disease: Secondary | ICD-10-CM | POA: Diagnosis not present

## 2018-07-14 DIAGNOSIS — D631 Anemia in chronic kidney disease: Secondary | ICD-10-CM | POA: Diagnosis not present

## 2018-07-14 DIAGNOSIS — N2581 Secondary hyperparathyroidism of renal origin: Secondary | ICD-10-CM | POA: Diagnosis not present

## 2018-07-14 DIAGNOSIS — Z992 Dependence on renal dialysis: Secondary | ICD-10-CM | POA: Diagnosis not present

## 2018-07-14 DIAGNOSIS — D509 Iron deficiency anemia, unspecified: Secondary | ICD-10-CM | POA: Diagnosis not present

## 2018-07-16 DIAGNOSIS — D631 Anemia in chronic kidney disease: Secondary | ICD-10-CM | POA: Diagnosis not present

## 2018-07-16 DIAGNOSIS — N186 End stage renal disease: Secondary | ICD-10-CM | POA: Diagnosis not present

## 2018-07-16 DIAGNOSIS — N2581 Secondary hyperparathyroidism of renal origin: Secondary | ICD-10-CM | POA: Diagnosis not present

## 2018-07-16 DIAGNOSIS — Z992 Dependence on renal dialysis: Secondary | ICD-10-CM | POA: Diagnosis not present

## 2018-07-16 DIAGNOSIS — D509 Iron deficiency anemia, unspecified: Secondary | ICD-10-CM | POA: Diagnosis not present

## 2018-07-19 DIAGNOSIS — N2581 Secondary hyperparathyroidism of renal origin: Secondary | ICD-10-CM | POA: Diagnosis not present

## 2018-07-19 DIAGNOSIS — D509 Iron deficiency anemia, unspecified: Secondary | ICD-10-CM | POA: Diagnosis not present

## 2018-07-19 DIAGNOSIS — Z992 Dependence on renal dialysis: Secondary | ICD-10-CM | POA: Diagnosis not present

## 2018-07-19 DIAGNOSIS — N186 End stage renal disease: Secondary | ICD-10-CM | POA: Diagnosis not present

## 2018-07-19 DIAGNOSIS — D631 Anemia in chronic kidney disease: Secondary | ICD-10-CM | POA: Diagnosis not present

## 2018-07-21 DIAGNOSIS — Z992 Dependence on renal dialysis: Secondary | ICD-10-CM | POA: Diagnosis not present

## 2018-07-21 DIAGNOSIS — N186 End stage renal disease: Secondary | ICD-10-CM | POA: Diagnosis not present

## 2018-07-21 DIAGNOSIS — D631 Anemia in chronic kidney disease: Secondary | ICD-10-CM | POA: Diagnosis not present

## 2018-07-21 DIAGNOSIS — D509 Iron deficiency anemia, unspecified: Secondary | ICD-10-CM | POA: Diagnosis not present

## 2018-07-23 DIAGNOSIS — D509 Iron deficiency anemia, unspecified: Secondary | ICD-10-CM | POA: Diagnosis not present

## 2018-07-23 DIAGNOSIS — N186 End stage renal disease: Secondary | ICD-10-CM | POA: Diagnosis not present

## 2018-07-23 DIAGNOSIS — D631 Anemia in chronic kidney disease: Secondary | ICD-10-CM | POA: Diagnosis not present

## 2018-07-23 DIAGNOSIS — Z992 Dependence on renal dialysis: Secondary | ICD-10-CM | POA: Diagnosis not present

## 2018-07-26 DIAGNOSIS — N186 End stage renal disease: Secondary | ICD-10-CM | POA: Diagnosis not present

## 2018-07-26 DIAGNOSIS — Z992 Dependence on renal dialysis: Secondary | ICD-10-CM | POA: Diagnosis not present

## 2018-07-26 DIAGNOSIS — D631 Anemia in chronic kidney disease: Secondary | ICD-10-CM | POA: Diagnosis not present

## 2018-07-26 DIAGNOSIS — D509 Iron deficiency anemia, unspecified: Secondary | ICD-10-CM | POA: Diagnosis not present

## 2018-07-28 DIAGNOSIS — Z992 Dependence on renal dialysis: Secondary | ICD-10-CM | POA: Diagnosis not present

## 2018-07-28 DIAGNOSIS — N186 End stage renal disease: Secondary | ICD-10-CM | POA: Diagnosis not present

## 2018-07-28 DIAGNOSIS — D509 Iron deficiency anemia, unspecified: Secondary | ICD-10-CM | POA: Diagnosis not present

## 2018-07-28 DIAGNOSIS — D631 Anemia in chronic kidney disease: Secondary | ICD-10-CM | POA: Diagnosis not present

## 2018-07-29 DIAGNOSIS — J441 Chronic obstructive pulmonary disease with (acute) exacerbation: Secondary | ICD-10-CM | POA: Diagnosis not present

## 2018-07-29 DIAGNOSIS — M810 Age-related osteoporosis without current pathological fracture: Secondary | ICD-10-CM | POA: Diagnosis not present

## 2018-07-29 DIAGNOSIS — Z7901 Long term (current) use of anticoagulants: Secondary | ICD-10-CM | POA: Diagnosis not present

## 2018-07-29 DIAGNOSIS — I5032 Chronic diastolic (congestive) heart failure: Secondary | ICD-10-CM | POA: Diagnosis not present

## 2018-07-29 DIAGNOSIS — I951 Orthostatic hypotension: Secondary | ICD-10-CM | POA: Diagnosis not present

## 2018-07-30 DIAGNOSIS — D631 Anemia in chronic kidney disease: Secondary | ICD-10-CM | POA: Diagnosis not present

## 2018-07-30 DIAGNOSIS — Z992 Dependence on renal dialysis: Secondary | ICD-10-CM | POA: Diagnosis not present

## 2018-07-30 DIAGNOSIS — N186 End stage renal disease: Secondary | ICD-10-CM | POA: Diagnosis not present

## 2018-07-30 DIAGNOSIS — D509 Iron deficiency anemia, unspecified: Secondary | ICD-10-CM | POA: Diagnosis not present

## 2018-08-02 ENCOUNTER — Other Ambulatory Visit: Payer: Self-pay

## 2018-08-02 DIAGNOSIS — Z801 Family history of malignant neoplasm of trachea, bronchus and lung: Secondary | ICD-10-CM | POA: Diagnosis not present

## 2018-08-02 DIAGNOSIS — Z800 Family history of malignant neoplasm of digestive organs: Secondary | ICD-10-CM | POA: Diagnosis not present

## 2018-08-02 DIAGNOSIS — R0789 Other chest pain: Secondary | ICD-10-CM | POA: Diagnosis not present

## 2018-08-02 DIAGNOSIS — Z7952 Long term (current) use of systemic steroids: Secondary | ICD-10-CM | POA: Diagnosis not present

## 2018-08-02 DIAGNOSIS — N186 End stage renal disease: Secondary | ICD-10-CM | POA: Diagnosis not present

## 2018-08-02 DIAGNOSIS — Z992 Dependence on renal dialysis: Secondary | ICD-10-CM | POA: Diagnosis not present

## 2018-08-02 DIAGNOSIS — I252 Old myocardial infarction: Secondary | ICD-10-CM | POA: Diagnosis not present

## 2018-08-02 DIAGNOSIS — G8929 Other chronic pain: Secondary | ICD-10-CM | POA: Diagnosis present

## 2018-08-02 DIAGNOSIS — Z7901 Long term (current) use of anticoagulants: Secondary | ICD-10-CM | POA: Diagnosis not present

## 2018-08-02 DIAGNOSIS — R0602 Shortness of breath: Secondary | ICD-10-CM | POA: Diagnosis not present

## 2018-08-02 DIAGNOSIS — M545 Low back pain: Secondary | ICD-10-CM | POA: Diagnosis present

## 2018-08-02 DIAGNOSIS — Z79899 Other long term (current) drug therapy: Secondary | ICD-10-CM | POA: Diagnosis not present

## 2018-08-02 DIAGNOSIS — I5042 Chronic combined systolic (congestive) and diastolic (congestive) heart failure: Secondary | ICD-10-CM | POA: Diagnosis present

## 2018-08-02 DIAGNOSIS — R079 Chest pain, unspecified: Secondary | ICD-10-CM | POA: Diagnosis not present

## 2018-08-02 DIAGNOSIS — Z89512 Acquired absence of left leg below knee: Secondary | ICD-10-CM | POA: Diagnosis not present

## 2018-08-02 DIAGNOSIS — I132 Hypertensive heart and chronic kidney disease with heart failure and with stage 5 chronic kidney disease, or end stage renal disease: Secondary | ICD-10-CM | POA: Diagnosis present

## 2018-08-02 DIAGNOSIS — Z89511 Acquired absence of right leg below knee: Secondary | ICD-10-CM | POA: Diagnosis not present

## 2018-08-02 DIAGNOSIS — Z23 Encounter for immunization: Secondary | ICD-10-CM | POA: Diagnosis not present

## 2018-08-02 DIAGNOSIS — J441 Chronic obstructive pulmonary disease with (acute) exacerbation: Secondary | ICD-10-CM | POA: Diagnosis not present

## 2018-08-02 DIAGNOSIS — M810 Age-related osteoporosis without current pathological fracture: Secondary | ICD-10-CM | POA: Diagnosis present

## 2018-08-02 DIAGNOSIS — J9621 Acute and chronic respiratory failure with hypoxia: Secondary | ICD-10-CM | POA: Diagnosis present

## 2018-08-02 DIAGNOSIS — E1151 Type 2 diabetes mellitus with diabetic peripheral angiopathy without gangrene: Secondary | ICD-10-CM | POA: Diagnosis present

## 2018-08-02 DIAGNOSIS — D631 Anemia in chronic kidney disease: Secondary | ICD-10-CM | POA: Diagnosis not present

## 2018-08-02 DIAGNOSIS — Z825 Family history of asthma and other chronic lower respiratory diseases: Secondary | ICD-10-CM | POA: Diagnosis not present

## 2018-08-02 DIAGNOSIS — I959 Hypotension, unspecified: Secondary | ICD-10-CM | POA: Diagnosis not present

## 2018-08-02 DIAGNOSIS — Z952 Presence of prosthetic heart valve: Secondary | ICD-10-CM | POA: Diagnosis not present

## 2018-08-02 DIAGNOSIS — I083 Combined rheumatic disorders of mitral, aortic and tricuspid valves: Secondary | ICD-10-CM | POA: Diagnosis present

## 2018-08-02 DIAGNOSIS — E271 Primary adrenocortical insufficiency: Secondary | ICD-10-CM | POA: Diagnosis present

## 2018-08-02 DIAGNOSIS — E1122 Type 2 diabetes mellitus with diabetic chronic kidney disease: Secondary | ICD-10-CM | POA: Diagnosis present

## 2018-08-02 DIAGNOSIS — F17220 Nicotine dependence, chewing tobacco, uncomplicated: Secondary | ICD-10-CM | POA: Diagnosis present

## 2018-08-02 DIAGNOSIS — D509 Iron deficiency anemia, unspecified: Secondary | ICD-10-CM | POA: Diagnosis not present

## 2018-08-09 DIAGNOSIS — D631 Anemia in chronic kidney disease: Secondary | ICD-10-CM | POA: Diagnosis not present

## 2018-08-09 DIAGNOSIS — D509 Iron deficiency anemia, unspecified: Secondary | ICD-10-CM | POA: Diagnosis not present

## 2018-08-09 DIAGNOSIS — N186 End stage renal disease: Secondary | ICD-10-CM | POA: Diagnosis not present

## 2018-08-09 DIAGNOSIS — Z992 Dependence on renal dialysis: Secondary | ICD-10-CM | POA: Diagnosis not present

## 2018-08-11 DIAGNOSIS — D631 Anemia in chronic kidney disease: Secondary | ICD-10-CM | POA: Diagnosis not present

## 2018-08-11 DIAGNOSIS — D509 Iron deficiency anemia, unspecified: Secondary | ICD-10-CM | POA: Diagnosis not present

## 2018-08-11 DIAGNOSIS — Z992 Dependence on renal dialysis: Secondary | ICD-10-CM | POA: Diagnosis not present

## 2018-08-11 DIAGNOSIS — N186 End stage renal disease: Secondary | ICD-10-CM | POA: Diagnosis not present

## 2018-08-13 DIAGNOSIS — N186 End stage renal disease: Secondary | ICD-10-CM | POA: Diagnosis not present

## 2018-08-13 DIAGNOSIS — D631 Anemia in chronic kidney disease: Secondary | ICD-10-CM | POA: Diagnosis not present

## 2018-08-13 DIAGNOSIS — D509 Iron deficiency anemia, unspecified: Secondary | ICD-10-CM | POA: Diagnosis not present

## 2018-08-13 DIAGNOSIS — Z992 Dependence on renal dialysis: Secondary | ICD-10-CM | POA: Diagnosis not present

## 2018-08-16 DIAGNOSIS — D631 Anemia in chronic kidney disease: Secondary | ICD-10-CM | POA: Diagnosis not present

## 2018-08-16 DIAGNOSIS — N186 End stage renal disease: Secondary | ICD-10-CM | POA: Diagnosis not present

## 2018-08-16 DIAGNOSIS — D509 Iron deficiency anemia, unspecified: Secondary | ICD-10-CM | POA: Diagnosis not present

## 2018-08-16 DIAGNOSIS — Z992 Dependence on renal dialysis: Secondary | ICD-10-CM | POA: Diagnosis not present

## 2018-08-17 DIAGNOSIS — Z7901 Long term (current) use of anticoagulants: Secondary | ICD-10-CM | POA: Diagnosis not present

## 2018-08-17 DIAGNOSIS — J441 Chronic obstructive pulmonary disease with (acute) exacerbation: Secondary | ICD-10-CM | POA: Diagnosis not present

## 2018-08-17 DIAGNOSIS — M810 Age-related osteoporosis without current pathological fracture: Secondary | ICD-10-CM | POA: Diagnosis not present

## 2018-08-17 DIAGNOSIS — I5032 Chronic diastolic (congestive) heart failure: Secondary | ICD-10-CM | POA: Diagnosis not present

## 2018-08-18 DIAGNOSIS — N186 End stage renal disease: Secondary | ICD-10-CM | POA: Diagnosis not present

## 2018-08-18 DIAGNOSIS — D631 Anemia in chronic kidney disease: Secondary | ICD-10-CM | POA: Diagnosis not present

## 2018-08-18 DIAGNOSIS — Z992 Dependence on renal dialysis: Secondary | ICD-10-CM | POA: Diagnosis not present

## 2018-08-18 DIAGNOSIS — D509 Iron deficiency anemia, unspecified: Secondary | ICD-10-CM | POA: Diagnosis not present

## 2018-08-19 DIAGNOSIS — Z992 Dependence on renal dialysis: Secondary | ICD-10-CM | POA: Diagnosis not present

## 2018-08-19 DIAGNOSIS — N186 End stage renal disease: Secondary | ICD-10-CM | POA: Diagnosis not present

## 2018-08-20 DIAGNOSIS — Z992 Dependence on renal dialysis: Secondary | ICD-10-CM | POA: Diagnosis not present

## 2018-08-20 DIAGNOSIS — N186 End stage renal disease: Secondary | ICD-10-CM | POA: Diagnosis not present

## 2018-08-20 DIAGNOSIS — D631 Anemia in chronic kidney disease: Secondary | ICD-10-CM | POA: Diagnosis not present

## 2018-08-20 DIAGNOSIS — D509 Iron deficiency anemia, unspecified: Secondary | ICD-10-CM | POA: Diagnosis not present

## 2018-08-23 DIAGNOSIS — D631 Anemia in chronic kidney disease: Secondary | ICD-10-CM | POA: Diagnosis not present

## 2018-08-23 DIAGNOSIS — Z992 Dependence on renal dialysis: Secondary | ICD-10-CM | POA: Diagnosis not present

## 2018-08-23 DIAGNOSIS — D509 Iron deficiency anemia, unspecified: Secondary | ICD-10-CM | POA: Diagnosis not present

## 2018-08-23 DIAGNOSIS — N186 End stage renal disease: Secondary | ICD-10-CM | POA: Diagnosis not present

## 2018-08-25 DIAGNOSIS — N186 End stage renal disease: Secondary | ICD-10-CM | POA: Diagnosis not present

## 2018-08-25 DIAGNOSIS — Z992 Dependence on renal dialysis: Secondary | ICD-10-CM | POA: Diagnosis not present

## 2018-08-25 DIAGNOSIS — D631 Anemia in chronic kidney disease: Secondary | ICD-10-CM | POA: Diagnosis not present

## 2018-08-25 DIAGNOSIS — D509 Iron deficiency anemia, unspecified: Secondary | ICD-10-CM | POA: Diagnosis not present

## 2018-08-27 DIAGNOSIS — Z992 Dependence on renal dialysis: Secondary | ICD-10-CM | POA: Diagnosis not present

## 2018-08-27 DIAGNOSIS — D509 Iron deficiency anemia, unspecified: Secondary | ICD-10-CM | POA: Diagnosis not present

## 2018-08-27 DIAGNOSIS — N186 End stage renal disease: Secondary | ICD-10-CM | POA: Diagnosis not present

## 2018-08-27 DIAGNOSIS — D631 Anemia in chronic kidney disease: Secondary | ICD-10-CM | POA: Diagnosis not present

## 2018-08-30 DIAGNOSIS — N186 End stage renal disease: Secondary | ICD-10-CM | POA: Diagnosis not present

## 2018-08-30 DIAGNOSIS — D509 Iron deficiency anemia, unspecified: Secondary | ICD-10-CM | POA: Diagnosis not present

## 2018-08-30 DIAGNOSIS — Z992 Dependence on renal dialysis: Secondary | ICD-10-CM | POA: Diagnosis not present

## 2018-08-30 DIAGNOSIS — D631 Anemia in chronic kidney disease: Secondary | ICD-10-CM | POA: Diagnosis not present

## 2018-09-01 DIAGNOSIS — D631 Anemia in chronic kidney disease: Secondary | ICD-10-CM | POA: Diagnosis not present

## 2018-09-01 DIAGNOSIS — D509 Iron deficiency anemia, unspecified: Secondary | ICD-10-CM | POA: Diagnosis not present

## 2018-09-01 DIAGNOSIS — Z992 Dependence on renal dialysis: Secondary | ICD-10-CM | POA: Diagnosis not present

## 2018-09-01 DIAGNOSIS — N186 End stage renal disease: Secondary | ICD-10-CM | POA: Diagnosis not present

## 2018-09-03 DIAGNOSIS — Z992 Dependence on renal dialysis: Secondary | ICD-10-CM | POA: Diagnosis not present

## 2018-09-03 DIAGNOSIS — D509 Iron deficiency anemia, unspecified: Secondary | ICD-10-CM | POA: Diagnosis not present

## 2018-09-03 DIAGNOSIS — N186 End stage renal disease: Secondary | ICD-10-CM | POA: Diagnosis not present

## 2018-09-03 DIAGNOSIS — D631 Anemia in chronic kidney disease: Secondary | ICD-10-CM | POA: Diagnosis not present

## 2018-09-06 DIAGNOSIS — Z992 Dependence on renal dialysis: Secondary | ICD-10-CM | POA: Diagnosis not present

## 2018-09-06 DIAGNOSIS — N186 End stage renal disease: Secondary | ICD-10-CM | POA: Diagnosis not present

## 2018-09-06 DIAGNOSIS — D509 Iron deficiency anemia, unspecified: Secondary | ICD-10-CM | POA: Diagnosis not present

## 2018-09-06 DIAGNOSIS — D631 Anemia in chronic kidney disease: Secondary | ICD-10-CM | POA: Diagnosis not present

## 2018-09-07 DIAGNOSIS — Z452 Encounter for adjustment and management of vascular access device: Secondary | ICD-10-CM | POA: Diagnosis not present

## 2018-09-08 DIAGNOSIS — N186 End stage renal disease: Secondary | ICD-10-CM | POA: Diagnosis not present

## 2018-09-08 DIAGNOSIS — Z992 Dependence on renal dialysis: Secondary | ICD-10-CM | POA: Diagnosis not present

## 2018-09-08 DIAGNOSIS — D631 Anemia in chronic kidney disease: Secondary | ICD-10-CM | POA: Diagnosis not present

## 2018-09-08 DIAGNOSIS — D509 Iron deficiency anemia, unspecified: Secondary | ICD-10-CM | POA: Diagnosis not present

## 2018-09-09 ENCOUNTER — Inpatient Hospital Stay (HOSPITAL_COMMUNITY): Admission: RE | Admit: 2018-09-09 | Payer: Medicare Other | Source: Ambulatory Visit

## 2018-09-09 ENCOUNTER — Encounter: Payer: Medicare Other | Admitting: Vascular Surgery

## 2018-09-09 DIAGNOSIS — N186 End stage renal disease: Secondary | ICD-10-CM | POA: Diagnosis not present

## 2018-09-09 DIAGNOSIS — I10 Essential (primary) hypertension: Secondary | ICD-10-CM | POA: Diagnosis not present

## 2018-09-09 DIAGNOSIS — M545 Low back pain: Secondary | ICD-10-CM | POA: Diagnosis not present

## 2018-09-10 ENCOUNTER — Encounter: Payer: Self-pay | Admitting: Vascular Surgery

## 2018-09-10 DIAGNOSIS — Z992 Dependence on renal dialysis: Secondary | ICD-10-CM | POA: Diagnosis not present

## 2018-09-10 DIAGNOSIS — N186 End stage renal disease: Secondary | ICD-10-CM | POA: Diagnosis not present

## 2018-09-10 DIAGNOSIS — D631 Anemia in chronic kidney disease: Secondary | ICD-10-CM | POA: Diagnosis not present

## 2018-09-10 DIAGNOSIS — D509 Iron deficiency anemia, unspecified: Secondary | ICD-10-CM | POA: Diagnosis not present

## 2018-09-13 DIAGNOSIS — D509 Iron deficiency anemia, unspecified: Secondary | ICD-10-CM | POA: Diagnosis not present

## 2018-09-13 DIAGNOSIS — N186 End stage renal disease: Secondary | ICD-10-CM | POA: Diagnosis not present

## 2018-09-13 DIAGNOSIS — D631 Anemia in chronic kidney disease: Secondary | ICD-10-CM | POA: Diagnosis not present

## 2018-09-13 DIAGNOSIS — Z992 Dependence on renal dialysis: Secondary | ICD-10-CM | POA: Diagnosis not present

## 2018-09-15 DIAGNOSIS — Z992 Dependence on renal dialysis: Secondary | ICD-10-CM | POA: Diagnosis not present

## 2018-09-15 DIAGNOSIS — D509 Iron deficiency anemia, unspecified: Secondary | ICD-10-CM | POA: Diagnosis not present

## 2018-09-15 DIAGNOSIS — N186 End stage renal disease: Secondary | ICD-10-CM | POA: Diagnosis not present

## 2018-09-15 DIAGNOSIS — D631 Anemia in chronic kidney disease: Secondary | ICD-10-CM | POA: Diagnosis not present

## 2018-09-17 DIAGNOSIS — Z992 Dependence on renal dialysis: Secondary | ICD-10-CM | POA: Diagnosis not present

## 2018-09-17 DIAGNOSIS — N186 End stage renal disease: Secondary | ICD-10-CM | POA: Diagnosis not present

## 2018-09-17 DIAGNOSIS — D509 Iron deficiency anemia, unspecified: Secondary | ICD-10-CM | POA: Diagnosis not present

## 2018-09-17 DIAGNOSIS — D631 Anemia in chronic kidney disease: Secondary | ICD-10-CM | POA: Diagnosis not present

## 2018-09-18 DIAGNOSIS — Z992 Dependence on renal dialysis: Secondary | ICD-10-CM | POA: Diagnosis not present

## 2018-09-18 DIAGNOSIS — N186 End stage renal disease: Secondary | ICD-10-CM | POA: Diagnosis not present

## 2018-09-20 DIAGNOSIS — N2581 Secondary hyperparathyroidism of renal origin: Secondary | ICD-10-CM | POA: Diagnosis not present

## 2018-09-20 DIAGNOSIS — D631 Anemia in chronic kidney disease: Secondary | ICD-10-CM | POA: Diagnosis not present

## 2018-09-20 DIAGNOSIS — N186 End stage renal disease: Secondary | ICD-10-CM | POA: Diagnosis not present

## 2018-09-20 DIAGNOSIS — Z992 Dependence on renal dialysis: Secondary | ICD-10-CM | POA: Diagnosis not present

## 2018-09-22 DIAGNOSIS — D631 Anemia in chronic kidney disease: Secondary | ICD-10-CM | POA: Diagnosis not present

## 2018-09-22 DIAGNOSIS — N186 End stage renal disease: Secondary | ICD-10-CM | POA: Diagnosis not present

## 2018-09-22 DIAGNOSIS — N2581 Secondary hyperparathyroidism of renal origin: Secondary | ICD-10-CM | POA: Diagnosis not present

## 2018-09-22 DIAGNOSIS — Z992 Dependence on renal dialysis: Secondary | ICD-10-CM | POA: Diagnosis not present

## 2018-09-24 DIAGNOSIS — N186 End stage renal disease: Secondary | ICD-10-CM | POA: Diagnosis not present

## 2018-09-24 DIAGNOSIS — Z992 Dependence on renal dialysis: Secondary | ICD-10-CM | POA: Diagnosis not present

## 2018-09-24 DIAGNOSIS — D631 Anemia in chronic kidney disease: Secondary | ICD-10-CM | POA: Diagnosis not present

## 2018-09-24 DIAGNOSIS — N2581 Secondary hyperparathyroidism of renal origin: Secondary | ICD-10-CM | POA: Diagnosis not present

## 2018-09-27 DIAGNOSIS — D631 Anemia in chronic kidney disease: Secondary | ICD-10-CM | POA: Diagnosis not present

## 2018-09-27 DIAGNOSIS — N2581 Secondary hyperparathyroidism of renal origin: Secondary | ICD-10-CM | POA: Diagnosis not present

## 2018-09-27 DIAGNOSIS — N186 End stage renal disease: Secondary | ICD-10-CM | POA: Diagnosis not present

## 2018-09-27 DIAGNOSIS — Z992 Dependence on renal dialysis: Secondary | ICD-10-CM | POA: Diagnosis not present

## 2018-09-28 ENCOUNTER — Encounter: Payer: Medicare Other | Admitting: Vascular Surgery

## 2018-09-28 ENCOUNTER — Encounter (HOSPITAL_COMMUNITY): Payer: Medicare Other

## 2018-09-29 DIAGNOSIS — Z992 Dependence on renal dialysis: Secondary | ICD-10-CM | POA: Diagnosis not present

## 2018-09-29 DIAGNOSIS — D631 Anemia in chronic kidney disease: Secondary | ICD-10-CM | POA: Diagnosis not present

## 2018-09-29 DIAGNOSIS — N186 End stage renal disease: Secondary | ICD-10-CM | POA: Diagnosis not present

## 2018-09-29 DIAGNOSIS — N2581 Secondary hyperparathyroidism of renal origin: Secondary | ICD-10-CM | POA: Diagnosis not present

## 2018-10-01 DIAGNOSIS — N186 End stage renal disease: Secondary | ICD-10-CM | POA: Diagnosis not present

## 2018-10-01 DIAGNOSIS — D631 Anemia in chronic kidney disease: Secondary | ICD-10-CM | POA: Diagnosis not present

## 2018-10-01 DIAGNOSIS — N2581 Secondary hyperparathyroidism of renal origin: Secondary | ICD-10-CM | POA: Diagnosis not present

## 2018-10-01 DIAGNOSIS — Z992 Dependence on renal dialysis: Secondary | ICD-10-CM | POA: Diagnosis not present

## 2018-10-04 DIAGNOSIS — Z992 Dependence on renal dialysis: Secondary | ICD-10-CM | POA: Diagnosis not present

## 2018-10-04 DIAGNOSIS — D631 Anemia in chronic kidney disease: Secondary | ICD-10-CM | POA: Diagnosis not present

## 2018-10-04 DIAGNOSIS — N2581 Secondary hyperparathyroidism of renal origin: Secondary | ICD-10-CM | POA: Diagnosis not present

## 2018-10-04 DIAGNOSIS — N186 End stage renal disease: Secondary | ICD-10-CM | POA: Diagnosis not present

## 2018-10-06 DIAGNOSIS — D631 Anemia in chronic kidney disease: Secondary | ICD-10-CM | POA: Diagnosis not present

## 2018-10-06 DIAGNOSIS — N2581 Secondary hyperparathyroidism of renal origin: Secondary | ICD-10-CM | POA: Diagnosis not present

## 2018-10-06 DIAGNOSIS — Z992 Dependence on renal dialysis: Secondary | ICD-10-CM | POA: Diagnosis not present

## 2018-10-06 DIAGNOSIS — N186 End stage renal disease: Secondary | ICD-10-CM | POA: Diagnosis not present

## 2018-10-08 DIAGNOSIS — N2581 Secondary hyperparathyroidism of renal origin: Secondary | ICD-10-CM | POA: Diagnosis not present

## 2018-10-08 DIAGNOSIS — Z992 Dependence on renal dialysis: Secondary | ICD-10-CM | POA: Diagnosis not present

## 2018-10-08 DIAGNOSIS — N186 End stage renal disease: Secondary | ICD-10-CM | POA: Diagnosis not present

## 2018-10-08 DIAGNOSIS — D631 Anemia in chronic kidney disease: Secondary | ICD-10-CM | POA: Diagnosis not present

## 2018-10-10 DIAGNOSIS — N2581 Secondary hyperparathyroidism of renal origin: Secondary | ICD-10-CM | POA: Diagnosis not present

## 2018-10-10 DIAGNOSIS — D631 Anemia in chronic kidney disease: Secondary | ICD-10-CM | POA: Diagnosis not present

## 2018-10-10 DIAGNOSIS — Z992 Dependence on renal dialysis: Secondary | ICD-10-CM | POA: Diagnosis not present

## 2018-10-10 DIAGNOSIS — N186 End stage renal disease: Secondary | ICD-10-CM | POA: Diagnosis not present

## 2018-10-11 DIAGNOSIS — Z452 Encounter for adjustment and management of vascular access device: Secondary | ICD-10-CM | POA: Diagnosis not present

## 2018-10-12 DIAGNOSIS — Z7983 Long term (current) use of bisphosphonates: Secondary | ICD-10-CM | POA: Diagnosis not present

## 2018-10-12 DIAGNOSIS — Z87891 Personal history of nicotine dependence: Secondary | ICD-10-CM | POA: Diagnosis not present

## 2018-10-12 DIAGNOSIS — Z79899 Other long term (current) drug therapy: Secondary | ICD-10-CM | POA: Diagnosis not present

## 2018-10-12 DIAGNOSIS — R0602 Shortness of breath: Secondary | ICD-10-CM | POA: Diagnosis not present

## 2018-10-12 DIAGNOSIS — M545 Low back pain: Secondary | ICD-10-CM | POA: Diagnosis present

## 2018-10-12 DIAGNOSIS — Z992 Dependence on renal dialysis: Secondary | ICD-10-CM | POA: Diagnosis not present

## 2018-10-12 DIAGNOSIS — R0902 Hypoxemia: Secondary | ICD-10-CM | POA: Diagnosis not present

## 2018-10-12 DIAGNOSIS — F17220 Nicotine dependence, chewing tobacco, uncomplicated: Secondary | ICD-10-CM | POA: Diagnosis present

## 2018-10-12 DIAGNOSIS — Z89511 Acquired absence of right leg below knee: Secondary | ICD-10-CM | POA: Diagnosis not present

## 2018-10-12 DIAGNOSIS — E271 Primary adrenocortical insufficiency: Secondary | ICD-10-CM | POA: Diagnosis present

## 2018-10-12 DIAGNOSIS — I447 Left bundle-branch block, unspecified: Secondary | ICD-10-CM | POA: Diagnosis not present

## 2018-10-12 DIAGNOSIS — M810 Age-related osteoporosis without current pathological fracture: Secondary | ICD-10-CM | POA: Diagnosis present

## 2018-10-12 DIAGNOSIS — R531 Weakness: Secondary | ICD-10-CM | POA: Diagnosis not present

## 2018-10-12 DIAGNOSIS — N186 End stage renal disease: Secondary | ICD-10-CM | POA: Diagnosis not present

## 2018-10-12 DIAGNOSIS — G8929 Other chronic pain: Secondary | ICD-10-CM | POA: Diagnosis present

## 2018-10-12 DIAGNOSIS — Z952 Presence of prosthetic heart valve: Secondary | ICD-10-CM | POA: Diagnosis not present

## 2018-10-12 DIAGNOSIS — I081 Rheumatic disorders of both mitral and tricuspid valves: Secondary | ICD-10-CM | POA: Diagnosis present

## 2018-10-12 DIAGNOSIS — Z7901 Long term (current) use of anticoagulants: Secondary | ICD-10-CM | POA: Diagnosis not present

## 2018-10-12 DIAGNOSIS — Z86711 Personal history of pulmonary embolism: Secondary | ICD-10-CM | POA: Diagnosis not present

## 2018-10-12 DIAGNOSIS — Z89512 Acquired absence of left leg below knee: Secondary | ICD-10-CM | POA: Diagnosis not present

## 2018-10-12 DIAGNOSIS — Z7401 Bed confinement status: Secondary | ICD-10-CM | POA: Diagnosis not present

## 2018-10-12 DIAGNOSIS — E1151 Type 2 diabetes mellitus with diabetic peripheral angiopathy without gangrene: Secondary | ICD-10-CM | POA: Diagnosis present

## 2018-10-12 DIAGNOSIS — I951 Orthostatic hypotension: Secondary | ICD-10-CM | POA: Diagnosis present

## 2018-10-12 DIAGNOSIS — N2581 Secondary hyperparathyroidism of renal origin: Secondary | ICD-10-CM | POA: Diagnosis not present

## 2018-10-12 DIAGNOSIS — D631 Anemia in chronic kidney disease: Secondary | ICD-10-CM | POA: Diagnosis not present

## 2018-10-12 DIAGNOSIS — I5043 Acute on chronic combined systolic (congestive) and diastolic (congestive) heart failure: Secondary | ICD-10-CM | POA: Diagnosis present

## 2018-10-12 DIAGNOSIS — R062 Wheezing: Secondary | ICD-10-CM | POA: Diagnosis not present

## 2018-10-12 DIAGNOSIS — F329 Major depressive disorder, single episode, unspecified: Secondary | ICD-10-CM | POA: Diagnosis present

## 2018-10-12 DIAGNOSIS — E1122 Type 2 diabetes mellitus with diabetic chronic kidney disease: Secondary | ICD-10-CM | POA: Diagnosis present

## 2018-10-12 DIAGNOSIS — Z7952 Long term (current) use of systemic steroids: Secondary | ICD-10-CM | POA: Diagnosis not present

## 2018-10-12 DIAGNOSIS — I120 Hypertensive chronic kidney disease with stage 5 chronic kidney disease or end stage renal disease: Secondary | ICD-10-CM | POA: Diagnosis present

## 2018-10-12 DIAGNOSIS — J441 Chronic obstructive pulmonary disease with (acute) exacerbation: Secondary | ICD-10-CM | POA: Diagnosis not present

## 2018-10-18 DIAGNOSIS — N2581 Secondary hyperparathyroidism of renal origin: Secondary | ICD-10-CM | POA: Diagnosis not present

## 2018-10-18 DIAGNOSIS — N186 End stage renal disease: Secondary | ICD-10-CM | POA: Diagnosis not present

## 2018-10-18 DIAGNOSIS — Z992 Dependence on renal dialysis: Secondary | ICD-10-CM | POA: Diagnosis not present

## 2018-10-18 DIAGNOSIS — D631 Anemia in chronic kidney disease: Secondary | ICD-10-CM | POA: Diagnosis not present

## 2018-10-19 DIAGNOSIS — N186 End stage renal disease: Secondary | ICD-10-CM | POA: Diagnosis not present

## 2018-10-19 DIAGNOSIS — Z992 Dependence on renal dialysis: Secondary | ICD-10-CM | POA: Diagnosis not present

## 2018-10-20 DIAGNOSIS — Z992 Dependence on renal dialysis: Secondary | ICD-10-CM | POA: Diagnosis not present

## 2018-10-20 DIAGNOSIS — D631 Anemia in chronic kidney disease: Secondary | ICD-10-CM | POA: Diagnosis not present

## 2018-10-20 DIAGNOSIS — N186 End stage renal disease: Secondary | ICD-10-CM | POA: Diagnosis not present

## 2018-10-20 DIAGNOSIS — D509 Iron deficiency anemia, unspecified: Secondary | ICD-10-CM | POA: Diagnosis not present

## 2018-10-22 DIAGNOSIS — D509 Iron deficiency anemia, unspecified: Secondary | ICD-10-CM | POA: Diagnosis not present

## 2018-10-22 DIAGNOSIS — Z992 Dependence on renal dialysis: Secondary | ICD-10-CM | POA: Diagnosis not present

## 2018-10-22 DIAGNOSIS — N186 End stage renal disease: Secondary | ICD-10-CM | POA: Diagnosis not present

## 2018-10-22 DIAGNOSIS — D631 Anemia in chronic kidney disease: Secondary | ICD-10-CM | POA: Diagnosis not present

## 2018-10-25 DIAGNOSIS — D631 Anemia in chronic kidney disease: Secondary | ICD-10-CM | POA: Diagnosis not present

## 2018-10-25 DIAGNOSIS — D509 Iron deficiency anemia, unspecified: Secondary | ICD-10-CM | POA: Diagnosis not present

## 2018-10-25 DIAGNOSIS — Z992 Dependence on renal dialysis: Secondary | ICD-10-CM | POA: Diagnosis not present

## 2018-10-25 DIAGNOSIS — N186 End stage renal disease: Secondary | ICD-10-CM | POA: Diagnosis not present

## 2018-10-25 DIAGNOSIS — J441 Chronic obstructive pulmonary disease with (acute) exacerbation: Secondary | ICD-10-CM | POA: Diagnosis not present

## 2018-10-25 DIAGNOSIS — Z7901 Long term (current) use of anticoagulants: Secondary | ICD-10-CM | POA: Diagnosis not present

## 2018-10-27 DIAGNOSIS — Z992 Dependence on renal dialysis: Secondary | ICD-10-CM | POA: Diagnosis not present

## 2018-10-27 DIAGNOSIS — D509 Iron deficiency anemia, unspecified: Secondary | ICD-10-CM | POA: Diagnosis not present

## 2018-10-27 DIAGNOSIS — D631 Anemia in chronic kidney disease: Secondary | ICD-10-CM | POA: Diagnosis not present

## 2018-10-27 DIAGNOSIS — N186 End stage renal disease: Secondary | ICD-10-CM | POA: Diagnosis not present

## 2018-10-29 DIAGNOSIS — D631 Anemia in chronic kidney disease: Secondary | ICD-10-CM | POA: Diagnosis not present

## 2018-10-29 DIAGNOSIS — D509 Iron deficiency anemia, unspecified: Secondary | ICD-10-CM | POA: Diagnosis not present

## 2018-10-29 DIAGNOSIS — Z992 Dependence on renal dialysis: Secondary | ICD-10-CM | POA: Diagnosis not present

## 2018-10-29 DIAGNOSIS — N186 End stage renal disease: Secondary | ICD-10-CM | POA: Diagnosis not present

## 2018-11-01 DIAGNOSIS — D509 Iron deficiency anemia, unspecified: Secondary | ICD-10-CM | POA: Diagnosis not present

## 2018-11-01 DIAGNOSIS — N186 End stage renal disease: Secondary | ICD-10-CM | POA: Diagnosis not present

## 2018-11-01 DIAGNOSIS — D631 Anemia in chronic kidney disease: Secondary | ICD-10-CM | POA: Diagnosis not present

## 2018-11-01 DIAGNOSIS — Z992 Dependence on renal dialysis: Secondary | ICD-10-CM | POA: Diagnosis not present

## 2018-11-03 DIAGNOSIS — N186 End stage renal disease: Secondary | ICD-10-CM | POA: Diagnosis not present

## 2018-11-03 DIAGNOSIS — D631 Anemia in chronic kidney disease: Secondary | ICD-10-CM | POA: Diagnosis not present

## 2018-11-03 DIAGNOSIS — D509 Iron deficiency anemia, unspecified: Secondary | ICD-10-CM | POA: Diagnosis not present

## 2018-11-03 DIAGNOSIS — Z992 Dependence on renal dialysis: Secondary | ICD-10-CM | POA: Diagnosis not present

## 2018-11-05 DIAGNOSIS — D509 Iron deficiency anemia, unspecified: Secondary | ICD-10-CM | POA: Diagnosis not present

## 2018-11-05 DIAGNOSIS — Z992 Dependence on renal dialysis: Secondary | ICD-10-CM | POA: Diagnosis not present

## 2018-11-05 DIAGNOSIS — N186 End stage renal disease: Secondary | ICD-10-CM | POA: Diagnosis not present

## 2018-11-05 DIAGNOSIS — D631 Anemia in chronic kidney disease: Secondary | ICD-10-CM | POA: Diagnosis not present

## 2018-11-08 DIAGNOSIS — N186 End stage renal disease: Secondary | ICD-10-CM | POA: Diagnosis not present

## 2018-11-08 DIAGNOSIS — D509 Iron deficiency anemia, unspecified: Secondary | ICD-10-CM | POA: Diagnosis not present

## 2018-11-08 DIAGNOSIS — D631 Anemia in chronic kidney disease: Secondary | ICD-10-CM | POA: Diagnosis not present

## 2018-11-08 DIAGNOSIS — Z992 Dependence on renal dialysis: Secondary | ICD-10-CM | POA: Diagnosis not present

## 2018-11-09 DIAGNOSIS — J441 Chronic obstructive pulmonary disease with (acute) exacerbation: Secondary | ICD-10-CM | POA: Diagnosis not present

## 2018-11-09 DIAGNOSIS — M545 Low back pain: Secondary | ICD-10-CM | POA: Diagnosis not present

## 2018-11-09 DIAGNOSIS — I10 Essential (primary) hypertension: Secondary | ICD-10-CM | POA: Diagnosis not present

## 2018-11-09 DIAGNOSIS — Z7901 Long term (current) use of anticoagulants: Secondary | ICD-10-CM | POA: Diagnosis not present

## 2018-11-10 DIAGNOSIS — D509 Iron deficiency anemia, unspecified: Secondary | ICD-10-CM | POA: Diagnosis not present

## 2018-11-10 DIAGNOSIS — D631 Anemia in chronic kidney disease: Secondary | ICD-10-CM | POA: Diagnosis not present

## 2018-11-10 DIAGNOSIS — Z992 Dependence on renal dialysis: Secondary | ICD-10-CM | POA: Diagnosis not present

## 2018-11-10 DIAGNOSIS — N186 End stage renal disease: Secondary | ICD-10-CM | POA: Diagnosis not present

## 2018-11-12 DIAGNOSIS — D509 Iron deficiency anemia, unspecified: Secondary | ICD-10-CM | POA: Diagnosis not present

## 2018-11-12 DIAGNOSIS — D631 Anemia in chronic kidney disease: Secondary | ICD-10-CM | POA: Diagnosis not present

## 2018-11-12 DIAGNOSIS — N186 End stage renal disease: Secondary | ICD-10-CM | POA: Diagnosis not present

## 2018-11-12 DIAGNOSIS — Z992 Dependence on renal dialysis: Secondary | ICD-10-CM | POA: Diagnosis not present

## 2018-11-15 DIAGNOSIS — N186 End stage renal disease: Secondary | ICD-10-CM | POA: Diagnosis not present

## 2018-11-15 DIAGNOSIS — Z992 Dependence on renal dialysis: Secondary | ICD-10-CM | POA: Diagnosis not present

## 2018-11-15 DIAGNOSIS — D509 Iron deficiency anemia, unspecified: Secondary | ICD-10-CM | POA: Diagnosis not present

## 2018-11-15 DIAGNOSIS — D631 Anemia in chronic kidney disease: Secondary | ICD-10-CM | POA: Diagnosis not present

## 2018-11-17 DIAGNOSIS — N186 End stage renal disease: Secondary | ICD-10-CM | POA: Diagnosis not present

## 2018-11-17 DIAGNOSIS — Z992 Dependence on renal dialysis: Secondary | ICD-10-CM | POA: Diagnosis not present

## 2018-11-17 DIAGNOSIS — Z452 Encounter for adjustment and management of vascular access device: Secondary | ICD-10-CM | POA: Diagnosis not present

## 2018-11-17 DIAGNOSIS — D509 Iron deficiency anemia, unspecified: Secondary | ICD-10-CM | POA: Diagnosis not present

## 2018-11-17 DIAGNOSIS — D631 Anemia in chronic kidney disease: Secondary | ICD-10-CM | POA: Diagnosis not present

## 2018-11-19 DIAGNOSIS — N186 End stage renal disease: Secondary | ICD-10-CM | POA: Diagnosis not present

## 2018-11-19 DIAGNOSIS — D631 Anemia in chronic kidney disease: Secondary | ICD-10-CM | POA: Diagnosis not present

## 2018-11-19 DIAGNOSIS — Z992 Dependence on renal dialysis: Secondary | ICD-10-CM | POA: Diagnosis not present

## 2018-11-19 DIAGNOSIS — D509 Iron deficiency anemia, unspecified: Secondary | ICD-10-CM | POA: Diagnosis not present

## 2018-11-22 DIAGNOSIS — D509 Iron deficiency anemia, unspecified: Secondary | ICD-10-CM | POA: Diagnosis not present

## 2018-11-22 DIAGNOSIS — N186 End stage renal disease: Secondary | ICD-10-CM | POA: Diagnosis not present

## 2018-11-22 DIAGNOSIS — D631 Anemia in chronic kidney disease: Secondary | ICD-10-CM | POA: Diagnosis not present

## 2018-11-22 DIAGNOSIS — Z992 Dependence on renal dialysis: Secondary | ICD-10-CM | POA: Diagnosis not present

## 2018-11-23 ENCOUNTER — Encounter: Payer: Medicare Other | Admitting: Vascular Surgery

## 2018-11-23 ENCOUNTER — Encounter (HOSPITAL_COMMUNITY): Payer: Medicare Other

## 2018-11-23 ENCOUNTER — Encounter: Payer: Self-pay | Admitting: Family

## 2018-11-24 DIAGNOSIS — D509 Iron deficiency anemia, unspecified: Secondary | ICD-10-CM | POA: Diagnosis not present

## 2018-11-24 DIAGNOSIS — Z992 Dependence on renal dialysis: Secondary | ICD-10-CM | POA: Diagnosis not present

## 2018-11-24 DIAGNOSIS — D631 Anemia in chronic kidney disease: Secondary | ICD-10-CM | POA: Diagnosis not present

## 2018-11-24 DIAGNOSIS — N186 End stage renal disease: Secondary | ICD-10-CM | POA: Diagnosis not present

## 2018-11-26 DIAGNOSIS — D509 Iron deficiency anemia, unspecified: Secondary | ICD-10-CM | POA: Diagnosis not present

## 2018-11-26 DIAGNOSIS — Z992 Dependence on renal dialysis: Secondary | ICD-10-CM | POA: Diagnosis not present

## 2018-11-26 DIAGNOSIS — D631 Anemia in chronic kidney disease: Secondary | ICD-10-CM | POA: Diagnosis not present

## 2018-11-26 DIAGNOSIS — N186 End stage renal disease: Secondary | ICD-10-CM | POA: Diagnosis not present

## 2018-11-29 DIAGNOSIS — D631 Anemia in chronic kidney disease: Secondary | ICD-10-CM | POA: Diagnosis not present

## 2018-11-29 DIAGNOSIS — D509 Iron deficiency anemia, unspecified: Secondary | ICD-10-CM | POA: Diagnosis not present

## 2018-11-29 DIAGNOSIS — Z992 Dependence on renal dialysis: Secondary | ICD-10-CM | POA: Diagnosis not present

## 2018-11-29 DIAGNOSIS — N186 End stage renal disease: Secondary | ICD-10-CM | POA: Diagnosis not present

## 2018-12-01 DIAGNOSIS — D631 Anemia in chronic kidney disease: Secondary | ICD-10-CM | POA: Diagnosis not present

## 2018-12-01 DIAGNOSIS — D509 Iron deficiency anemia, unspecified: Secondary | ICD-10-CM | POA: Diagnosis not present

## 2018-12-01 DIAGNOSIS — N186 End stage renal disease: Secondary | ICD-10-CM | POA: Diagnosis not present

## 2018-12-01 DIAGNOSIS — Z992 Dependence on renal dialysis: Secondary | ICD-10-CM | POA: Diagnosis not present

## 2018-12-03 DIAGNOSIS — D631 Anemia in chronic kidney disease: Secondary | ICD-10-CM | POA: Diagnosis not present

## 2018-12-03 DIAGNOSIS — D509 Iron deficiency anemia, unspecified: Secondary | ICD-10-CM | POA: Diagnosis not present

## 2018-12-03 DIAGNOSIS — Z992 Dependence on renal dialysis: Secondary | ICD-10-CM | POA: Diagnosis not present

## 2018-12-03 DIAGNOSIS — N186 End stage renal disease: Secondary | ICD-10-CM | POA: Diagnosis not present

## 2018-12-06 DIAGNOSIS — D509 Iron deficiency anemia, unspecified: Secondary | ICD-10-CM | POA: Diagnosis not present

## 2018-12-06 DIAGNOSIS — D631 Anemia in chronic kidney disease: Secondary | ICD-10-CM | POA: Diagnosis not present

## 2018-12-06 DIAGNOSIS — Z992 Dependence on renal dialysis: Secondary | ICD-10-CM | POA: Diagnosis not present

## 2018-12-06 DIAGNOSIS — N186 End stage renal disease: Secondary | ICD-10-CM | POA: Diagnosis not present

## 2018-12-08 DIAGNOSIS — D509 Iron deficiency anemia, unspecified: Secondary | ICD-10-CM | POA: Diagnosis not present

## 2018-12-08 DIAGNOSIS — N186 End stage renal disease: Secondary | ICD-10-CM | POA: Diagnosis not present

## 2018-12-08 DIAGNOSIS — D631 Anemia in chronic kidney disease: Secondary | ICD-10-CM | POA: Diagnosis not present

## 2018-12-08 DIAGNOSIS — Z992 Dependence on renal dialysis: Secondary | ICD-10-CM | POA: Diagnosis not present

## 2018-12-10 DIAGNOSIS — D631 Anemia in chronic kidney disease: Secondary | ICD-10-CM | POA: Diagnosis not present

## 2018-12-10 DIAGNOSIS — D509 Iron deficiency anemia, unspecified: Secondary | ICD-10-CM | POA: Diagnosis not present

## 2018-12-10 DIAGNOSIS — N186 End stage renal disease: Secondary | ICD-10-CM | POA: Diagnosis not present

## 2018-12-10 DIAGNOSIS — Z992 Dependence on renal dialysis: Secondary | ICD-10-CM | POA: Diagnosis not present

## 2018-12-13 DIAGNOSIS — Z992 Dependence on renal dialysis: Secondary | ICD-10-CM | POA: Diagnosis not present

## 2018-12-13 DIAGNOSIS — D509 Iron deficiency anemia, unspecified: Secondary | ICD-10-CM | POA: Diagnosis not present

## 2018-12-13 DIAGNOSIS — N186 End stage renal disease: Secondary | ICD-10-CM | POA: Diagnosis not present

## 2018-12-13 DIAGNOSIS — D631 Anemia in chronic kidney disease: Secondary | ICD-10-CM | POA: Diagnosis not present

## 2018-12-15 DIAGNOSIS — Z452 Encounter for adjustment and management of vascular access device: Secondary | ICD-10-CM | POA: Diagnosis not present

## 2018-12-15 DIAGNOSIS — D509 Iron deficiency anemia, unspecified: Secondary | ICD-10-CM | POA: Diagnosis not present

## 2018-12-15 DIAGNOSIS — N186 End stage renal disease: Secondary | ICD-10-CM | POA: Diagnosis not present

## 2018-12-15 DIAGNOSIS — Z992 Dependence on renal dialysis: Secondary | ICD-10-CM | POA: Diagnosis not present

## 2018-12-15 DIAGNOSIS — D631 Anemia in chronic kidney disease: Secondary | ICD-10-CM | POA: Diagnosis not present

## 2018-12-17 DIAGNOSIS — Z992 Dependence on renal dialysis: Secondary | ICD-10-CM | POA: Diagnosis not present

## 2018-12-17 DIAGNOSIS — N186 End stage renal disease: Secondary | ICD-10-CM | POA: Diagnosis not present

## 2018-12-17 DIAGNOSIS — D631 Anemia in chronic kidney disease: Secondary | ICD-10-CM | POA: Diagnosis not present

## 2018-12-17 DIAGNOSIS — D509 Iron deficiency anemia, unspecified: Secondary | ICD-10-CM | POA: Diagnosis not present

## 2018-12-18 DIAGNOSIS — Z992 Dependence on renal dialysis: Secondary | ICD-10-CM | POA: Diagnosis not present

## 2018-12-18 DIAGNOSIS — N186 End stage renal disease: Secondary | ICD-10-CM | POA: Diagnosis not present

## 2018-12-20 DIAGNOSIS — D509 Iron deficiency anemia, unspecified: Secondary | ICD-10-CM | POA: Diagnosis not present

## 2018-12-20 DIAGNOSIS — N2581 Secondary hyperparathyroidism of renal origin: Secondary | ICD-10-CM | POA: Diagnosis not present

## 2018-12-20 DIAGNOSIS — Z992 Dependence on renal dialysis: Secondary | ICD-10-CM | POA: Diagnosis not present

## 2018-12-20 DIAGNOSIS — N186 End stage renal disease: Secondary | ICD-10-CM | POA: Diagnosis not present

## 2018-12-20 DIAGNOSIS — D631 Anemia in chronic kidney disease: Secondary | ICD-10-CM | POA: Diagnosis not present

## 2018-12-22 DIAGNOSIS — D631 Anemia in chronic kidney disease: Secondary | ICD-10-CM | POA: Diagnosis not present

## 2018-12-22 DIAGNOSIS — N186 End stage renal disease: Secondary | ICD-10-CM | POA: Diagnosis not present

## 2018-12-22 DIAGNOSIS — D509 Iron deficiency anemia, unspecified: Secondary | ICD-10-CM | POA: Diagnosis not present

## 2018-12-22 DIAGNOSIS — Z992 Dependence on renal dialysis: Secondary | ICD-10-CM | POA: Diagnosis not present

## 2018-12-22 DIAGNOSIS — N2581 Secondary hyperparathyroidism of renal origin: Secondary | ICD-10-CM | POA: Diagnosis not present

## 2018-12-24 DIAGNOSIS — N186 End stage renal disease: Secondary | ICD-10-CM | POA: Diagnosis not present

## 2018-12-24 DIAGNOSIS — N2581 Secondary hyperparathyroidism of renal origin: Secondary | ICD-10-CM | POA: Diagnosis not present

## 2018-12-24 DIAGNOSIS — D509 Iron deficiency anemia, unspecified: Secondary | ICD-10-CM | POA: Diagnosis not present

## 2018-12-24 DIAGNOSIS — D631 Anemia in chronic kidney disease: Secondary | ICD-10-CM | POA: Diagnosis not present

## 2018-12-24 DIAGNOSIS — Z992 Dependence on renal dialysis: Secondary | ICD-10-CM | POA: Diagnosis not present

## 2018-12-27 DIAGNOSIS — D509 Iron deficiency anemia, unspecified: Secondary | ICD-10-CM | POA: Diagnosis not present

## 2018-12-27 DIAGNOSIS — N186 End stage renal disease: Secondary | ICD-10-CM | POA: Diagnosis not present

## 2018-12-27 DIAGNOSIS — N2581 Secondary hyperparathyroidism of renal origin: Secondary | ICD-10-CM | POA: Diagnosis not present

## 2018-12-27 DIAGNOSIS — Z992 Dependence on renal dialysis: Secondary | ICD-10-CM | POA: Diagnosis not present

## 2018-12-27 DIAGNOSIS — D631 Anemia in chronic kidney disease: Secondary | ICD-10-CM | POA: Diagnosis not present

## 2018-12-29 DIAGNOSIS — D509 Iron deficiency anemia, unspecified: Secondary | ICD-10-CM | POA: Diagnosis not present

## 2018-12-29 DIAGNOSIS — N186 End stage renal disease: Secondary | ICD-10-CM | POA: Diagnosis not present

## 2018-12-29 DIAGNOSIS — Z992 Dependence on renal dialysis: Secondary | ICD-10-CM | POA: Diagnosis not present

## 2018-12-29 DIAGNOSIS — D631 Anemia in chronic kidney disease: Secondary | ICD-10-CM | POA: Diagnosis not present

## 2018-12-29 DIAGNOSIS — N2581 Secondary hyperparathyroidism of renal origin: Secondary | ICD-10-CM | POA: Diagnosis not present

## 2018-12-31 DIAGNOSIS — D509 Iron deficiency anemia, unspecified: Secondary | ICD-10-CM | POA: Diagnosis not present

## 2018-12-31 DIAGNOSIS — N2581 Secondary hyperparathyroidism of renal origin: Secondary | ICD-10-CM | POA: Diagnosis not present

## 2018-12-31 DIAGNOSIS — N186 End stage renal disease: Secondary | ICD-10-CM | POA: Diagnosis not present

## 2018-12-31 DIAGNOSIS — Z992 Dependence on renal dialysis: Secondary | ICD-10-CM | POA: Diagnosis not present

## 2018-12-31 DIAGNOSIS — D631 Anemia in chronic kidney disease: Secondary | ICD-10-CM | POA: Diagnosis not present

## 2019-01-03 DIAGNOSIS — D631 Anemia in chronic kidney disease: Secondary | ICD-10-CM | POA: Diagnosis not present

## 2019-01-03 DIAGNOSIS — N2581 Secondary hyperparathyroidism of renal origin: Secondary | ICD-10-CM | POA: Diagnosis not present

## 2019-01-03 DIAGNOSIS — D509 Iron deficiency anemia, unspecified: Secondary | ICD-10-CM | POA: Diagnosis not present

## 2019-01-03 DIAGNOSIS — Z992 Dependence on renal dialysis: Secondary | ICD-10-CM | POA: Diagnosis not present

## 2019-01-03 DIAGNOSIS — N186 End stage renal disease: Secondary | ICD-10-CM | POA: Diagnosis not present

## 2019-01-05 DIAGNOSIS — D509 Iron deficiency anemia, unspecified: Secondary | ICD-10-CM | POA: Diagnosis not present

## 2019-01-05 DIAGNOSIS — D631 Anemia in chronic kidney disease: Secondary | ICD-10-CM | POA: Diagnosis not present

## 2019-01-05 DIAGNOSIS — N2581 Secondary hyperparathyroidism of renal origin: Secondary | ICD-10-CM | POA: Diagnosis not present

## 2019-01-05 DIAGNOSIS — Z992 Dependence on renal dialysis: Secondary | ICD-10-CM | POA: Diagnosis not present

## 2019-01-05 DIAGNOSIS — N186 End stage renal disease: Secondary | ICD-10-CM | POA: Diagnosis not present

## 2019-01-07 DIAGNOSIS — D509 Iron deficiency anemia, unspecified: Secondary | ICD-10-CM | POA: Diagnosis not present

## 2019-01-07 DIAGNOSIS — N2581 Secondary hyperparathyroidism of renal origin: Secondary | ICD-10-CM | POA: Diagnosis not present

## 2019-01-07 DIAGNOSIS — N186 End stage renal disease: Secondary | ICD-10-CM | POA: Diagnosis not present

## 2019-01-07 DIAGNOSIS — D631 Anemia in chronic kidney disease: Secondary | ICD-10-CM | POA: Diagnosis not present

## 2019-01-07 DIAGNOSIS — Z992 Dependence on renal dialysis: Secondary | ICD-10-CM | POA: Diagnosis not present

## 2019-01-10 DIAGNOSIS — N186 End stage renal disease: Secondary | ICD-10-CM | POA: Diagnosis not present

## 2019-01-10 DIAGNOSIS — Z992 Dependence on renal dialysis: Secondary | ICD-10-CM | POA: Diagnosis not present

## 2019-01-10 DIAGNOSIS — N2581 Secondary hyperparathyroidism of renal origin: Secondary | ICD-10-CM | POA: Diagnosis not present

## 2019-01-10 DIAGNOSIS — E119 Type 2 diabetes mellitus without complications: Secondary | ICD-10-CM | POA: Diagnosis not present

## 2019-01-10 DIAGNOSIS — D631 Anemia in chronic kidney disease: Secondary | ICD-10-CM | POA: Diagnosis not present

## 2019-01-10 DIAGNOSIS — D509 Iron deficiency anemia, unspecified: Secondary | ICD-10-CM | POA: Diagnosis not present

## 2019-01-11 DIAGNOSIS — Z7901 Long term (current) use of anticoagulants: Secondary | ICD-10-CM | POA: Diagnosis not present

## 2019-01-11 DIAGNOSIS — M545 Low back pain: Secondary | ICD-10-CM | POA: Diagnosis not present

## 2019-01-11 DIAGNOSIS — J449 Chronic obstructive pulmonary disease, unspecified: Secondary | ICD-10-CM | POA: Diagnosis not present

## 2019-01-11 DIAGNOSIS — I10 Essential (primary) hypertension: Secondary | ICD-10-CM | POA: Diagnosis not present

## 2019-01-12 DIAGNOSIS — N186 End stage renal disease: Secondary | ICD-10-CM | POA: Diagnosis not present

## 2019-01-12 DIAGNOSIS — Z452 Encounter for adjustment and management of vascular access device: Secondary | ICD-10-CM | POA: Diagnosis not present

## 2019-01-12 DIAGNOSIS — Z992 Dependence on renal dialysis: Secondary | ICD-10-CM | POA: Diagnosis not present

## 2019-01-12 DIAGNOSIS — N2581 Secondary hyperparathyroidism of renal origin: Secondary | ICD-10-CM | POA: Diagnosis not present

## 2019-01-12 DIAGNOSIS — D631 Anemia in chronic kidney disease: Secondary | ICD-10-CM | POA: Diagnosis not present

## 2019-01-12 DIAGNOSIS — D509 Iron deficiency anemia, unspecified: Secondary | ICD-10-CM | POA: Diagnosis not present

## 2019-01-14 DIAGNOSIS — D509 Iron deficiency anemia, unspecified: Secondary | ICD-10-CM | POA: Diagnosis not present

## 2019-01-14 DIAGNOSIS — Z992 Dependence on renal dialysis: Secondary | ICD-10-CM | POA: Diagnosis not present

## 2019-01-14 DIAGNOSIS — N2581 Secondary hyperparathyroidism of renal origin: Secondary | ICD-10-CM | POA: Diagnosis not present

## 2019-01-14 DIAGNOSIS — D631 Anemia in chronic kidney disease: Secondary | ICD-10-CM | POA: Diagnosis not present

## 2019-01-14 DIAGNOSIS — N186 End stage renal disease: Secondary | ICD-10-CM | POA: Diagnosis not present

## 2019-01-17 DIAGNOSIS — D631 Anemia in chronic kidney disease: Secondary | ICD-10-CM | POA: Diagnosis not present

## 2019-01-17 DIAGNOSIS — D509 Iron deficiency anemia, unspecified: Secondary | ICD-10-CM | POA: Diagnosis not present

## 2019-01-17 DIAGNOSIS — N2581 Secondary hyperparathyroidism of renal origin: Secondary | ICD-10-CM | POA: Diagnosis not present

## 2019-01-17 DIAGNOSIS — N186 End stage renal disease: Secondary | ICD-10-CM | POA: Diagnosis not present

## 2019-01-17 DIAGNOSIS — Z992 Dependence on renal dialysis: Secondary | ICD-10-CM | POA: Diagnosis not present

## 2019-01-19 DIAGNOSIS — D631 Anemia in chronic kidney disease: Secondary | ICD-10-CM | POA: Diagnosis not present

## 2019-01-19 DIAGNOSIS — Z992 Dependence on renal dialysis: Secondary | ICD-10-CM | POA: Diagnosis not present

## 2019-01-19 DIAGNOSIS — D509 Iron deficiency anemia, unspecified: Secondary | ICD-10-CM | POA: Diagnosis not present

## 2019-01-19 DIAGNOSIS — N186 End stage renal disease: Secondary | ICD-10-CM | POA: Diagnosis not present

## 2019-01-21 DIAGNOSIS — D509 Iron deficiency anemia, unspecified: Secondary | ICD-10-CM | POA: Diagnosis not present

## 2019-01-21 DIAGNOSIS — D631 Anemia in chronic kidney disease: Secondary | ICD-10-CM | POA: Diagnosis not present

## 2019-01-21 DIAGNOSIS — Z992 Dependence on renal dialysis: Secondary | ICD-10-CM | POA: Diagnosis not present

## 2019-01-21 DIAGNOSIS — N186 End stage renal disease: Secondary | ICD-10-CM | POA: Diagnosis not present

## 2019-01-24 DIAGNOSIS — D631 Anemia in chronic kidney disease: Secondary | ICD-10-CM | POA: Diagnosis not present

## 2019-01-24 DIAGNOSIS — D509 Iron deficiency anemia, unspecified: Secondary | ICD-10-CM | POA: Diagnosis not present

## 2019-01-24 DIAGNOSIS — N186 End stage renal disease: Secondary | ICD-10-CM | POA: Diagnosis not present

## 2019-01-24 DIAGNOSIS — Z992 Dependence on renal dialysis: Secondary | ICD-10-CM | POA: Diagnosis not present

## 2019-01-25 DIAGNOSIS — M545 Low back pain: Secondary | ICD-10-CM | POA: Diagnosis not present

## 2019-01-25 DIAGNOSIS — J449 Chronic obstructive pulmonary disease, unspecified: Secondary | ICD-10-CM | POA: Diagnosis not present

## 2019-01-25 DIAGNOSIS — I10 Essential (primary) hypertension: Secondary | ICD-10-CM | POA: Diagnosis not present

## 2019-01-26 DIAGNOSIS — D509 Iron deficiency anemia, unspecified: Secondary | ICD-10-CM | POA: Diagnosis not present

## 2019-01-26 DIAGNOSIS — Z992 Dependence on renal dialysis: Secondary | ICD-10-CM | POA: Diagnosis not present

## 2019-01-26 DIAGNOSIS — D631 Anemia in chronic kidney disease: Secondary | ICD-10-CM | POA: Diagnosis not present

## 2019-01-26 DIAGNOSIS — N186 End stage renal disease: Secondary | ICD-10-CM | POA: Diagnosis not present

## 2019-01-28 DIAGNOSIS — N186 End stage renal disease: Secondary | ICD-10-CM | POA: Diagnosis not present

## 2019-01-28 DIAGNOSIS — D509 Iron deficiency anemia, unspecified: Secondary | ICD-10-CM | POA: Diagnosis not present

## 2019-01-28 DIAGNOSIS — Z992 Dependence on renal dialysis: Secondary | ICD-10-CM | POA: Diagnosis not present

## 2019-01-28 DIAGNOSIS — D631 Anemia in chronic kidney disease: Secondary | ICD-10-CM | POA: Diagnosis not present

## 2019-01-31 DIAGNOSIS — J80 Acute respiratory distress syndrome: Secondary | ICD-10-CM | POA: Diagnosis not present

## 2019-01-31 DIAGNOSIS — R05 Cough: Secondary | ICD-10-CM | POA: Diagnosis not present

## 2019-01-31 DIAGNOSIS — R112 Nausea with vomiting, unspecified: Secondary | ICD-10-CM | POA: Diagnosis not present

## 2019-01-31 DIAGNOSIS — R079 Chest pain, unspecified: Secondary | ICD-10-CM | POA: Diagnosis not present

## 2019-01-31 DIAGNOSIS — Z8249 Family history of ischemic heart disease and other diseases of the circulatory system: Secondary | ICD-10-CM | POA: Diagnosis not present

## 2019-01-31 DIAGNOSIS — I509 Heart failure, unspecified: Secondary | ICD-10-CM | POA: Diagnosis present

## 2019-01-31 DIAGNOSIS — Z9689 Presence of other specified functional implants: Secondary | ICD-10-CM | POA: Diagnosis not present

## 2019-01-31 DIAGNOSIS — Z79899 Other long term (current) drug therapy: Secondary | ICD-10-CM | POA: Diagnosis not present

## 2019-01-31 DIAGNOSIS — N200 Calculus of kidney: Secondary | ICD-10-CM | POA: Diagnosis present

## 2019-01-31 DIAGNOSIS — I132 Hypertensive heart and chronic kidney disease with heart failure and with stage 5 chronic kidney disease, or end stage renal disease: Secondary | ICD-10-CM | POA: Diagnosis present

## 2019-01-31 DIAGNOSIS — F419 Anxiety disorder, unspecified: Secondary | ICD-10-CM | POA: Diagnosis present

## 2019-01-31 DIAGNOSIS — Z7901 Long term (current) use of anticoagulants: Secondary | ICD-10-CM | POA: Diagnosis not present

## 2019-01-31 DIAGNOSIS — S88011A Complete traumatic amputation at knee level, right lower leg, initial encounter: Secondary | ICD-10-CM | POA: Diagnosis not present

## 2019-01-31 DIAGNOSIS — R0600 Dyspnea, unspecified: Secondary | ICD-10-CM | POA: Diagnosis not present

## 2019-01-31 DIAGNOSIS — I959 Hypotension, unspecified: Secondary | ICD-10-CM | POA: Diagnosis not present

## 2019-01-31 DIAGNOSIS — Z905 Acquired absence of kidney: Secondary | ICD-10-CM | POA: Diagnosis not present

## 2019-01-31 DIAGNOSIS — Z515 Encounter for palliative care: Secondary | ICD-10-CM | POA: Diagnosis not present

## 2019-01-31 DIAGNOSIS — G35 Multiple sclerosis: Secondary | ICD-10-CM | POA: Diagnosis not present

## 2019-01-31 DIAGNOSIS — Z87891 Personal history of nicotine dependence: Secondary | ICD-10-CM | POA: Diagnosis not present

## 2019-01-31 DIAGNOSIS — Z7401 Bed confinement status: Secondary | ICD-10-CM | POA: Diagnosis not present

## 2019-01-31 DIAGNOSIS — J811 Chronic pulmonary edema: Secondary | ICD-10-CM | POA: Diagnosis not present

## 2019-01-31 DIAGNOSIS — E875 Hyperkalemia: Secondary | ICD-10-CM | POA: Diagnosis present

## 2019-01-31 DIAGNOSIS — D649 Anemia, unspecified: Secondary | ICD-10-CM | POA: Diagnosis not present

## 2019-01-31 DIAGNOSIS — R5383 Other fatigue: Secondary | ICD-10-CM | POA: Diagnosis not present

## 2019-01-31 DIAGNOSIS — Z89512 Acquired absence of left leg below knee: Secondary | ICD-10-CM | POA: Diagnosis not present

## 2019-01-31 DIAGNOSIS — G8929 Other chronic pain: Secondary | ICD-10-CM | POA: Diagnosis present

## 2019-01-31 DIAGNOSIS — D631 Anemia in chronic kidney disease: Secondary | ICD-10-CM | POA: Diagnosis present

## 2019-01-31 DIAGNOSIS — E039 Hypothyroidism, unspecified: Secondary | ICD-10-CM | POA: Diagnosis not present

## 2019-01-31 DIAGNOSIS — R0602 Shortness of breath: Secondary | ICD-10-CM | POA: Diagnosis not present

## 2019-01-31 DIAGNOSIS — I442 Atrioventricular block, complete: Secondary | ICD-10-CM | POA: Diagnosis not present

## 2019-01-31 DIAGNOSIS — Z992 Dependence on renal dialysis: Secondary | ICD-10-CM | POA: Diagnosis not present

## 2019-01-31 DIAGNOSIS — D72829 Elevated white blood cell count, unspecified: Secondary | ICD-10-CM | POA: Diagnosis not present

## 2019-01-31 DIAGNOSIS — J189 Pneumonia, unspecified organism: Secondary | ICD-10-CM | POA: Diagnosis not present

## 2019-01-31 DIAGNOSIS — J440 Chronic obstructive pulmonary disease with acute lower respiratory infection: Secondary | ICD-10-CM | POA: Diagnosis present

## 2019-01-31 DIAGNOSIS — R7989 Other specified abnormal findings of blood chemistry: Secondary | ICD-10-CM | POA: Diagnosis not present

## 2019-01-31 DIAGNOSIS — A419 Sepsis, unspecified organism: Secondary | ICD-10-CM | POA: Diagnosis not present

## 2019-01-31 DIAGNOSIS — D539 Nutritional anemia, unspecified: Secondary | ICD-10-CM | POA: Diagnosis present

## 2019-01-31 DIAGNOSIS — Z89511 Acquired absence of right leg below knee: Secondary | ICD-10-CM | POA: Diagnosis not present

## 2019-01-31 DIAGNOSIS — J151 Pneumonia due to Pseudomonas: Secondary | ICD-10-CM | POA: Diagnosis present

## 2019-01-31 DIAGNOSIS — I340 Nonrheumatic mitral (valve) insufficiency: Secondary | ICD-10-CM | POA: Diagnosis not present

## 2019-01-31 DIAGNOSIS — B965 Pseudomonas (aeruginosa) (mallei) (pseudomallei) as the cause of diseases classified elsewhere: Secondary | ICD-10-CM | POA: Diagnosis not present

## 2019-01-31 DIAGNOSIS — Z950 Presence of cardiac pacemaker: Secondary | ICD-10-CM | POA: Diagnosis not present

## 2019-01-31 DIAGNOSIS — I499 Cardiac arrhythmia, unspecified: Secondary | ICD-10-CM | POA: Diagnosis not present

## 2019-01-31 DIAGNOSIS — I050 Rheumatic mitral stenosis: Secondary | ICD-10-CM | POA: Diagnosis present

## 2019-01-31 DIAGNOSIS — J439 Emphysema, unspecified: Secondary | ICD-10-CM | POA: Diagnosis not present

## 2019-01-31 DIAGNOSIS — J90 Pleural effusion, not elsewhere classified: Secondary | ICD-10-CM | POA: Diagnosis not present

## 2019-01-31 DIAGNOSIS — I517 Cardiomegaly: Secondary | ICD-10-CM | POA: Diagnosis not present

## 2019-01-31 DIAGNOSIS — Z20828 Contact with and (suspected) exposure to other viral communicable diseases: Secondary | ICD-10-CM | POA: Diagnosis not present

## 2019-01-31 DIAGNOSIS — E271 Primary adrenocortical insufficiency: Secondary | ICD-10-CM | POA: Diagnosis present

## 2019-01-31 DIAGNOSIS — I5030 Unspecified diastolic (congestive) heart failure: Secondary | ICD-10-CM | POA: Diagnosis not present

## 2019-01-31 DIAGNOSIS — R0689 Other abnormalities of breathing: Secondary | ICD-10-CM | POA: Diagnosis not present

## 2019-01-31 DIAGNOSIS — Z952 Presence of prosthetic heart valve: Secondary | ICD-10-CM | POA: Diagnosis not present

## 2019-01-31 DIAGNOSIS — I342 Nonrheumatic mitral (valve) stenosis: Secondary | ICD-10-CM | POA: Diagnosis not present

## 2019-01-31 DIAGNOSIS — R072 Precordial pain: Secondary | ICD-10-CM | POA: Diagnosis not present

## 2019-01-31 DIAGNOSIS — M6250 Muscle wasting and atrophy, not elsewhere classified, unspecified site: Secondary | ICD-10-CM | POA: Diagnosis not present

## 2019-01-31 DIAGNOSIS — Z888 Allergy status to other drugs, medicaments and biological substances status: Secondary | ICD-10-CM | POA: Diagnosis not present

## 2019-01-31 DIAGNOSIS — E876 Hypokalemia: Secondary | ICD-10-CM | POA: Diagnosis not present

## 2019-01-31 DIAGNOSIS — T82111A Breakdown (mechanical) of cardiac pulse generator (battery), initial encounter: Secondary | ICD-10-CM | POA: Diagnosis not present

## 2019-01-31 DIAGNOSIS — Z79891 Long term (current) use of opiate analgesic: Secondary | ICD-10-CM | POA: Diagnosis not present

## 2019-01-31 DIAGNOSIS — R531 Weakness: Secondary | ICD-10-CM | POA: Diagnosis not present

## 2019-01-31 DIAGNOSIS — D696 Thrombocytopenia, unspecified: Secondary | ICD-10-CM | POA: Diagnosis present

## 2019-01-31 DIAGNOSIS — I455 Other specified heart block: Secondary | ICD-10-CM | POA: Diagnosis not present

## 2019-01-31 DIAGNOSIS — Q600 Renal agenesis, unilateral: Secondary | ICD-10-CM | POA: Diagnosis not present

## 2019-01-31 DIAGNOSIS — J449 Chronic obstructive pulmonary disease, unspecified: Secondary | ICD-10-CM | POA: Diagnosis present

## 2019-01-31 DIAGNOSIS — I120 Hypertensive chronic kidney disease with stage 5 chronic kidney disease or end stage renal disease: Secondary | ICD-10-CM | POA: Diagnosis not present

## 2019-01-31 DIAGNOSIS — I272 Pulmonary hypertension, unspecified: Secondary | ICD-10-CM | POA: Diagnosis present

## 2019-01-31 DIAGNOSIS — R0902 Hypoxemia: Secondary | ICD-10-CM | POA: Diagnosis not present

## 2019-01-31 DIAGNOSIS — N186 End stage renal disease: Secondary | ICD-10-CM | POA: Diagnosis present

## 2019-01-31 DIAGNOSIS — I361 Nonrheumatic tricuspid (valve) insufficiency: Secondary | ICD-10-CM | POA: Diagnosis not present

## 2019-01-31 DIAGNOSIS — Z951 Presence of aortocoronary bypass graft: Secondary | ICD-10-CM | POA: Diagnosis not present

## 2019-02-04 MED ORDER — ASPIRIN 81 MG PO CHEW
81.00 | CHEWABLE_TABLET | ORAL | Status: DC
Start: 2019-02-14 — End: 2019-02-04

## 2019-02-04 MED ORDER — NITROGLYCERIN 0.4 MG SL SUBL
.40 | SUBLINGUAL_TABLET | SUBLINGUAL | Status: DC
Start: ? — End: 2019-02-04

## 2019-02-04 MED ORDER — GENERIC EXTERNAL MEDICATION
Status: DC
Start: ? — End: 2019-02-04

## 2019-02-04 MED ORDER — NICOTINE 21 MG/24HR TD PT24
1.00 | MEDICATED_PATCH | TRANSDERMAL | Status: DC
Start: 2019-02-05 — End: 2019-02-04

## 2019-02-04 MED ORDER — ZOTEX GPX PO
25.00 | ORAL | Status: DC
Start: ? — End: 2019-02-04

## 2019-02-04 MED ORDER — IPRATROPIUM-ALBUTEROL 0.5-2.5 (3) MG/3ML IN SOLN
3.00 | RESPIRATORY_TRACT | Status: DC
Start: 2019-02-13 — End: 2019-02-04

## 2019-02-04 MED ORDER — ACETAMINOPHEN 325 MG PO TABS
650.00 | ORAL_TABLET | ORAL | Status: DC
Start: ? — End: 2019-02-04

## 2019-02-04 MED ORDER — ALBUTEROL SULFATE (2.5 MG/3ML) 0.083% IN NEBU
2.50 | INHALATION_SOLUTION | RESPIRATORY_TRACT | Status: DC
Start: ? — End: 2019-02-04

## 2019-02-04 MED ORDER — NICOTINE POLACRILEX 4 MG MT GUM
4.00 | CHEWING_GUM | OROMUCOSAL | Status: DC
Start: ? — End: 2019-02-04

## 2019-02-04 MED ORDER — VITAMIN B-12 1000 MCG PO TABS
1000.00 | ORAL_TABLET | ORAL | Status: DC
Start: 2019-02-14 — End: 2019-02-04

## 2019-02-04 MED ORDER — MIDODRINE HCL 10 MG PO TABS
10.00 | ORAL_TABLET | ORAL | Status: DC
Start: 2019-02-07 — End: 2019-02-04

## 2019-02-04 MED ORDER — GENERIC EXTERNAL MEDICATION
1.00 | Status: DC
Start: 2019-02-07 — End: 2019-02-04

## 2019-02-04 MED ORDER — SEVELAMER CARBONATE 800 MG PO TABS
1600.00 | ORAL_TABLET | ORAL | Status: DC
Start: 2019-02-13 — End: 2019-02-04

## 2019-02-07 ENCOUNTER — Other Ambulatory Visit: Payer: Self-pay | Admitting: *Deleted

## 2019-02-07 MED ORDER — EPOETIN ALFA-EPBX 4000 UNIT/ML IJ SOLN
4000.00 | INTRAMUSCULAR | Status: DC
Start: 2019-02-14 — End: 2019-02-07

## 2019-02-07 MED ORDER — POLYETHYLENE GLYCOL 3350 17 G PO PACK
17.00 | PACK | ORAL | Status: DC
Start: ? — End: 2019-02-07

## 2019-02-07 MED ORDER — PREDNISONE 10 MG PO TABS
10.00 | ORAL_TABLET | ORAL | Status: DC
Start: 2019-02-08 — End: 2019-02-07

## 2019-02-07 MED ORDER — NICOTINE 21 MG/24HR TD PT24
2.00 | MEDICATED_PATCH | TRANSDERMAL | Status: DC
Start: 2019-02-14 — End: 2019-02-07

## 2019-02-07 NOTE — Patient Outreach (Signed)
[facility]) Care Management  02/07/2019  Zachary [name] 1961/02/10 [id number]   Initial telephone screening call   Referral source: Medicare Nex Gen  Referral reason : [facility] engagement  PCP: Dr. Stoney Bang  Patient history reviewed via  KPN/Epic care everywhere; Patient with history of ESRD, HD on MWF, Addison Disease , CHB, s/p pacemaker, bilateral BKA, HX of prosthestic aortic valve  Outreach call to patient, requesting to speak with patient person answering phone reports that patient is in the hospital.   Plan  Will review care everywhere for discharge  and plan follow up as discharge noted.    Joylene Draft, RN, [place] Management Coordinator  [phone]- Mobile [phone]- Toll Free Main Office

## 2019-02-09 MED ORDER — PIPERACILLIN-TAZOBACTAM 4.5 G IVPB
4.50 | INTRAVENOUS | Status: DC
Start: 2019-02-09 — End: 2019-02-09

## 2019-02-09 MED ORDER — SODIUM CHLORIDE 0.9 % IV SOLN
INTRAVENOUS | Status: DC
Start: ? — End: 2019-02-09

## 2019-02-09 MED ORDER — MIDODRINE HCL 10 MG PO TABS
20.00 | ORAL_TABLET | ORAL | Status: DC
Start: 2019-02-13 — End: 2019-02-09

## 2019-02-09 MED ORDER — OXYCODONE HCL 5 MG PO TABS
5.00 | ORAL_TABLET | ORAL | Status: DC
Start: ? — End: 2019-02-09

## 2019-02-09 MED ORDER — HYDROCORTISONE NA SUCCINATE PF 100 MG IJ SOLR
50.00 | INTRAMUSCULAR | Status: DC
Start: 2019-02-09 — End: 2019-02-09

## 2019-02-09 MED ORDER — VICON FORTE PO CAPS
17.00 | ORAL_CAPSULE | ORAL | Status: DC
Start: ? — End: 2019-02-09

## 2019-02-09 MED ORDER — BISACODYL 10 MG RE SUPP
10.00 | RECTAL | Status: DC
Start: ? — End: 2019-02-09

## 2019-02-14 DIAGNOSIS — I052 Rheumatic mitral stenosis with insufficiency: Secondary | ICD-10-CM | POA: Diagnosis not present

## 2019-02-14 MED ORDER — PREDNISONE 20 MG PO TABS
20.00 | ORAL_TABLET | ORAL | Status: DC
Start: 2019-02-14 — End: 2019-02-14

## 2019-02-14 MED ORDER — ACETAMINOPHEN 325 MG PO TABS
650.00 | ORAL_TABLET | ORAL | Status: DC
Start: 2019-02-13 — End: 2019-02-14

## 2019-02-14 MED ORDER — POLYETHYLENE GLYCOL 3350 17 G PO PACK
17.00 | PACK | ORAL | Status: DC
Start: 2019-02-14 — End: 2019-02-14

## 2019-02-14 MED ORDER — WARFARIN SODIUM 2.5 MG PO TABS
5.00 | ORAL_TABLET | ORAL | Status: DC
Start: 2019-02-13 — End: 2019-02-14

## 2019-02-15 DIAGNOSIS — R531 Weakness: Secondary | ICD-10-CM | POA: Diagnosis not present

## 2019-02-15 DIAGNOSIS — R52 Pain, unspecified: Secondary | ICD-10-CM | POA: Diagnosis not present

## 2019-02-15 DIAGNOSIS — E2749 Other adrenocortical insufficiency: Secondary | ICD-10-CM | POA: Diagnosis not present

## 2019-02-15 DIAGNOSIS — J449 Chronic obstructive pulmonary disease, unspecified: Secondary | ICD-10-CM | POA: Diagnosis not present

## 2019-02-15 DIAGNOSIS — R0602 Shortness of breath: Secondary | ICD-10-CM | POA: Diagnosis not present

## 2019-02-15 DIAGNOSIS — N186 End stage renal disease: Secondary | ICD-10-CM | POA: Diagnosis not present

## 2019-02-16 ENCOUNTER — Other Ambulatory Visit: Payer: Self-pay | Admitting: *Deleted

## 2019-02-16 DIAGNOSIS — N186 End stage renal disease: Secondary | ICD-10-CM | POA: Diagnosis not present

## 2019-02-16 DIAGNOSIS — R531 Weakness: Secondary | ICD-10-CM | POA: Diagnosis not present

## 2019-02-16 DIAGNOSIS — R0602 Shortness of breath: Secondary | ICD-10-CM | POA: Diagnosis not present

## 2019-02-16 DIAGNOSIS — J449 Chronic obstructive pulmonary disease, unspecified: Secondary | ICD-10-CM | POA: Diagnosis not present

## 2019-02-16 DIAGNOSIS — R52 Pain, unspecified: Secondary | ICD-10-CM | POA: Diagnosis not present

## 2019-02-16 DIAGNOSIS — E2749 Other adrenocortical insufficiency: Secondary | ICD-10-CM | POA: Diagnosis not present

## 2019-02-16 NOTE — Patient Outreach (Addendum)
[facility]) Care Management  02/16/2019  Zachary [name] Apr 29, 1961 [id number]   [facility] Screening call   Referral source: Insurance plan Referral reason , Tier 5 , [facility] engagement   Outreach call to patient home number,  person answering phone identifying self as Zachary [name] patient significant other, listed on medical record as person to contact. Zachary [name] was able to confirm patient identifiers x 2, she discussed patient discharge from [facility] on 2/26 she discussed his medical condition of heart valve problem, dialysis patient and other serious medical problems, states patient is not able to talk on the phone.  . She reports that patient made the decision on Monday 2/27 to go with hospice at home. She reports support that they are receiving  from [facility],  emotional support provided.   Plan  Will send successful outreach letter.   Joylene Draft, RN, [place] Management Coordinator  [phone]- Mobile [phone]- Toll Free Main Office

## 2019-02-18 DIAGNOSIS — N186 End stage renal disease: Secondary | ICD-10-CM | POA: Diagnosis not present

## 2019-02-18 DIAGNOSIS — J449 Chronic obstructive pulmonary disease, unspecified: Secondary | ICD-10-CM | POA: Diagnosis not present

## 2019-02-18 DIAGNOSIS — R52 Pain, unspecified: Secondary | ICD-10-CM | POA: Diagnosis not present

## 2019-02-18 DIAGNOSIS — R531 Weakness: Secondary | ICD-10-CM | POA: Diagnosis not present

## 2019-02-18 DIAGNOSIS — R0602 Shortness of breath: Secondary | ICD-10-CM | POA: Diagnosis not present

## 2019-02-18 DIAGNOSIS — E2749 Other adrenocortical insufficiency: Secondary | ICD-10-CM | POA: Diagnosis not present

## 2019-02-20 DIAGNOSIS — J449 Chronic obstructive pulmonary disease, unspecified: Secondary | ICD-10-CM | POA: Diagnosis not present

## 2019-02-20 DIAGNOSIS — R52 Pain, unspecified: Secondary | ICD-10-CM | POA: Diagnosis not present

## 2019-02-20 DIAGNOSIS — N186 End stage renal disease: Secondary | ICD-10-CM | POA: Diagnosis not present

## 2019-02-20 DIAGNOSIS — E2749 Other adrenocortical insufficiency: Secondary | ICD-10-CM | POA: Diagnosis not present

## 2019-02-20 DIAGNOSIS — R0602 Shortness of breath: Secondary | ICD-10-CM | POA: Diagnosis not present

## 2019-02-20 DIAGNOSIS — R531 Weakness: Secondary | ICD-10-CM | POA: Diagnosis not present

## 2019-02-21 DIAGNOSIS — E2749 Other adrenocortical insufficiency: Secondary | ICD-10-CM | POA: Diagnosis not present

## 2019-02-21 DIAGNOSIS — R0602 Shortness of breath: Secondary | ICD-10-CM | POA: Diagnosis not present

## 2019-02-21 DIAGNOSIS — N186 End stage renal disease: Secondary | ICD-10-CM | POA: Diagnosis not present

## 2019-02-21 DIAGNOSIS — R531 Weakness: Secondary | ICD-10-CM | POA: Diagnosis not present

## 2019-02-21 DIAGNOSIS — J449 Chronic obstructive pulmonary disease, unspecified: Secondary | ICD-10-CM | POA: Diagnosis not present

## 2019-02-21 DIAGNOSIS — R52 Pain, unspecified: Secondary | ICD-10-CM | POA: Diagnosis not present

## 2019-03-21 DEATH — deceased
